# Patient Record
Sex: Male | Born: 1955 | Race: White | Hispanic: No | Marital: Married | Smoking: Current every day smoker
Health system: Southern US, Community
[De-identification: ages and names within clinical notes are randomized; demographics above are authoritative.]

## PROBLEM LIST (undated history)

## (undated) DIAGNOSIS — N429 Disorder of prostate, unspecified: Secondary | ICD-10-CM

## (undated) DIAGNOSIS — K219 Gastro-esophageal reflux disease without esophagitis: Secondary | ICD-10-CM

## (undated) DIAGNOSIS — E785 Hyperlipidemia, unspecified: Secondary | ICD-10-CM

## (undated) DIAGNOSIS — R0600 Dyspnea, unspecified: Secondary | ICD-10-CM

## (undated) DIAGNOSIS — N189 Chronic kidney disease, unspecified: Secondary | ICD-10-CM

## (undated) DIAGNOSIS — I639 Cerebral infarction, unspecified: Secondary | ICD-10-CM

## (undated) DIAGNOSIS — Z974 Presence of external hearing-aid: Secondary | ICD-10-CM

## (undated) DIAGNOSIS — Z972 Presence of dental prosthetic device (complete) (partial): Secondary | ICD-10-CM

## (undated) DIAGNOSIS — F419 Anxiety disorder, unspecified: Secondary | ICD-10-CM

## (undated) DIAGNOSIS — J45909 Unspecified asthma, uncomplicated: Secondary | ICD-10-CM

## (undated) DIAGNOSIS — I10 Essential (primary) hypertension: Secondary | ICD-10-CM

## (undated) DIAGNOSIS — R399 Unspecified symptoms and signs involving the genitourinary system: Secondary | ICD-10-CM

## (undated) DIAGNOSIS — J449 Chronic obstructive pulmonary disease, unspecified: Secondary | ICD-10-CM

## (undated) DIAGNOSIS — E079 Disorder of thyroid, unspecified: Secondary | ICD-10-CM

## (undated) DIAGNOSIS — M1990 Unspecified osteoarthritis, unspecified site: Secondary | ICD-10-CM

## (undated) HISTORY — PX: HERNIA REPAIR: SHX51

## (undated) HISTORY — DX: Unspecified asthma, uncomplicated: J45.909

## (undated) HISTORY — DX: Disorder of thyroid, unspecified: E07.9

## (undated) HISTORY — DX: Hyperlipidemia, unspecified: E78.5

## (undated) HISTORY — PX: BACK SURGERY: SHX140

## (undated) HISTORY — DX: Anxiety disorder, unspecified: F41.9

## (undated) HISTORY — PX: TONSILLECTOMY: SUR1361

## (undated) HISTORY — PX: EYE SURGERY: SHX253

## (undated) HISTORY — DX: Disorder of prostate, unspecified: N42.9

## (undated) HISTORY — DX: Chronic obstructive pulmonary disease, unspecified: J44.9

## (undated) HISTORY — DX: Cerebral infarction, unspecified: I63.9

## (undated) HISTORY — DX: Essential (primary) hypertension: I10

---

## 2004-11-20 ENCOUNTER — Emergency Department: Payer: Self-pay | Admitting: Internal Medicine

## 2004-11-20 ENCOUNTER — Other Ambulatory Visit: Payer: Self-pay

## 2004-11-24 ENCOUNTER — Ambulatory Visit: Payer: Self-pay | Admitting: Internal Medicine

## 2005-10-03 ENCOUNTER — Emergency Department: Payer: Self-pay | Admitting: Emergency Medicine

## 2009-04-03 ENCOUNTER — Emergency Department: Payer: Self-pay | Admitting: Unknown Physician Specialty

## 2009-09-25 ENCOUNTER — Ambulatory Visit: Payer: Self-pay | Admitting: Surgery

## 2009-10-01 ENCOUNTER — Ambulatory Visit: Payer: Self-pay | Admitting: Surgery

## 2009-10-14 ENCOUNTER — Emergency Department: Payer: Self-pay | Admitting: Emergency Medicine

## 2009-11-12 ENCOUNTER — Emergency Department: Payer: Self-pay | Admitting: Emergency Medicine

## 2009-11-29 ENCOUNTER — Inpatient Hospital Stay: Payer: Self-pay | Admitting: Psychiatry

## 2010-05-06 ENCOUNTER — Emergency Department: Payer: Self-pay | Admitting: Emergency Medicine

## 2010-05-08 ENCOUNTER — Ambulatory Visit: Payer: Self-pay | Admitting: Family Medicine

## 2011-07-15 ENCOUNTER — Emergency Department: Payer: Self-pay | Admitting: Emergency Medicine

## 2011-07-15 LAB — COMPREHENSIVE METABOLIC PANEL
Anion Gap: 7 (ref 7–16)
Chloride: 103 mmol/L (ref 98–107)
Co2: 27 mmol/L (ref 21–32)
EGFR (African American): >60
EGFR (Non-African Amer.): >60
Glucose: 106 mg/dL — ABNORMAL HIGH (ref 65–99)
SGOT(AST): 21 U/L (ref 15–37)

## 2011-07-15 LAB — CBC
HCT: 52.8 % — ABNORMAL HIGH (ref 40.0–52.0)
HGB: 17.5 g/dL (ref 13.0–18.0)
MCH: 30.7 pg (ref 26.0–34.0)
MCV: 93 fL (ref 80–100)
Platelet: 265 10*3/uL (ref 150–440)
RDW: 14.1 % (ref 11.5–14.5)
WBC: 17.8 10*3/uL — ABNORMAL HIGH (ref 3.8–10.6)

## 2011-07-15 LAB — URINALYSIS, COMPLETE
Glucose,UR: NEGATIVE mg/dL (ref 0–75)
Nitrite: NEGATIVE
Ph: 5 (ref 4.5–8.0)
Protein: 30
RBC,UR: 245 /HPF (ref 0–5)
Squamous Epithelial: 1
WBC UR: 2 /HPF (ref 0–5)

## 2011-09-13 ENCOUNTER — Emergency Department: Payer: Self-pay | Admitting: Emergency Medicine

## 2012-02-09 ENCOUNTER — Emergency Department: Payer: Self-pay | Admitting: Unknown Physician Specialty

## 2012-02-10 LAB — RAPID INFLUENZA A&B ANTIGENS

## 2012-03-01 DIAGNOSIS — J309 Allergic rhinitis, unspecified: Secondary | ICD-10-CM | POA: Insufficient documentation

## 2012-03-01 DIAGNOSIS — Z0000 Encounter for general adult medical examination without abnormal findings: Secondary | ICD-10-CM | POA: Insufficient documentation

## 2012-05-04 ENCOUNTER — Ambulatory Visit: Payer: Self-pay | Admitting: Pain Medicine

## 2012-05-17 ENCOUNTER — Ambulatory Visit: Payer: Self-pay | Admitting: Pain Medicine

## 2012-05-25 DIAGNOSIS — B360 Pityriasis versicolor: Secondary | ICD-10-CM | POA: Insufficient documentation

## 2012-06-15 ENCOUNTER — Ambulatory Visit: Payer: Self-pay | Admitting: Pain Medicine

## 2012-06-26 ENCOUNTER — Ambulatory Visit: Payer: Self-pay | Admitting: Pain Medicine

## 2012-07-25 ENCOUNTER — Ambulatory Visit: Payer: Self-pay | Admitting: Pain Medicine

## 2012-08-01 ENCOUNTER — Emergency Department: Payer: Self-pay | Admitting: Emergency Medicine

## 2012-08-02 ENCOUNTER — Ambulatory Visit: Payer: Self-pay | Admitting: Pain Medicine

## 2012-08-31 ENCOUNTER — Ambulatory Visit: Payer: Self-pay | Admitting: Pain Medicine

## 2012-09-13 ENCOUNTER — Ambulatory Visit: Payer: Self-pay | Admitting: Pain Medicine

## 2012-10-10 ENCOUNTER — Ambulatory Visit: Payer: Self-pay | Admitting: Pain Medicine

## 2012-10-23 ENCOUNTER — Ambulatory Visit: Payer: Self-pay | Admitting: Pain Medicine

## 2012-11-21 ENCOUNTER — Ambulatory Visit: Payer: Self-pay | Admitting: Pain Medicine

## 2012-12-04 ENCOUNTER — Ambulatory Visit: Payer: Self-pay | Admitting: Pain Medicine

## 2012-12-27 DIAGNOSIS — N529 Male erectile dysfunction, unspecified: Secondary | ICD-10-CM | POA: Insufficient documentation

## 2012-12-27 DIAGNOSIS — R399 Unspecified symptoms and signs involving the genitourinary system: Secondary | ICD-10-CM | POA: Insufficient documentation

## 2013-01-02 ENCOUNTER — Ambulatory Visit: Payer: Self-pay | Admitting: Pain Medicine

## 2013-01-08 ENCOUNTER — Ambulatory Visit: Payer: Self-pay | Admitting: Pain Medicine

## 2013-02-06 ENCOUNTER — Ambulatory Visit: Payer: Self-pay | Admitting: Pain Medicine

## 2013-02-12 ENCOUNTER — Ambulatory Visit: Payer: Self-pay | Admitting: Pain Medicine

## 2013-03-15 ENCOUNTER — Ambulatory Visit: Payer: Self-pay | Admitting: Pain Medicine

## 2013-03-19 ENCOUNTER — Ambulatory Visit: Payer: Self-pay | Admitting: Pain Medicine

## 2013-04-17 ENCOUNTER — Ambulatory Visit: Payer: Self-pay | Admitting: Pain Medicine

## 2013-04-23 ENCOUNTER — Ambulatory Visit: Payer: Self-pay | Admitting: Pain Medicine

## 2013-05-07 ENCOUNTER — Ambulatory Visit: Payer: Self-pay | Admitting: Pain Medicine

## 2013-05-22 DIAGNOSIS — F17200 Nicotine dependence, unspecified, uncomplicated: Secondary | ICD-10-CM | POA: Insufficient documentation

## 2013-06-05 ENCOUNTER — Ambulatory Visit: Payer: Self-pay | Admitting: Pain Medicine

## 2013-06-06 DIAGNOSIS — I6782 Cerebral ischemia: Secondary | ICD-10-CM | POA: Insufficient documentation

## 2013-06-06 DIAGNOSIS — G939 Disorder of brain, unspecified: Secondary | ICD-10-CM | POA: Insufficient documentation

## 2013-06-13 ENCOUNTER — Ambulatory Visit: Payer: Self-pay | Admitting: Pain Medicine

## 2013-07-03 ENCOUNTER — Ambulatory Visit: Payer: Self-pay | Admitting: Pain Medicine

## 2013-07-09 ENCOUNTER — Ambulatory Visit: Payer: Self-pay | Admitting: Pain Medicine

## 2013-08-07 ENCOUNTER — Ambulatory Visit: Payer: Self-pay | Admitting: Pain Medicine

## 2013-08-13 ENCOUNTER — Ambulatory Visit: Payer: Self-pay | Admitting: Pain Medicine

## 2013-09-11 ENCOUNTER — Ambulatory Visit: Payer: Self-pay | Admitting: Pain Medicine

## 2013-09-19 ENCOUNTER — Ambulatory Visit: Payer: Self-pay | Admitting: Pain Medicine

## 2013-10-16 ENCOUNTER — Ambulatory Visit: Payer: Self-pay | Admitting: Pain Medicine

## 2013-10-22 ENCOUNTER — Ambulatory Visit: Payer: Self-pay | Admitting: Pain Medicine

## 2013-11-13 ENCOUNTER — Ambulatory Visit: Payer: Self-pay | Admitting: Pain Medicine

## 2013-11-14 ENCOUNTER — Ambulatory Visit: Payer: Self-pay | Admitting: Pain Medicine

## 2013-12-11 ENCOUNTER — Ambulatory Visit: Payer: Self-pay | Admitting: Pain Medicine

## 2013-12-12 ENCOUNTER — Ambulatory Visit: Payer: Self-pay | Admitting: Pain Medicine

## 2014-01-10 ENCOUNTER — Ambulatory Visit: Payer: Self-pay | Admitting: Pain Medicine

## 2014-01-14 ENCOUNTER — Ambulatory Visit: Payer: Self-pay | Admitting: Pain Medicine

## 2014-02-12 ENCOUNTER — Ambulatory Visit: Payer: Self-pay | Admitting: Pain Medicine

## 2014-02-13 ENCOUNTER — Ambulatory Visit: Payer: Self-pay | Admitting: Pain Medicine

## 2014-03-12 ENCOUNTER — Emergency Department: Payer: Self-pay | Admitting: Emergency Medicine

## 2014-03-18 ENCOUNTER — Ambulatory Visit: Payer: Self-pay | Admitting: Pain Medicine

## 2014-04-12 ENCOUNTER — Other Ambulatory Visit: Payer: Self-pay | Admitting: Neurosurgery

## 2014-04-12 DIAGNOSIS — M5126 Other intervertebral disc displacement, lumbar region: Secondary | ICD-10-CM

## 2014-04-18 ENCOUNTER — Emergency Department: Payer: Self-pay | Admitting: Emergency Medicine

## 2014-04-18 ENCOUNTER — Ambulatory Visit: Payer: Self-pay | Admitting: Pain Medicine

## 2014-04-19 ENCOUNTER — Ambulatory Visit
Admission: RE | Admit: 2014-04-19 | Discharge: 2014-04-19 | Disposition: A | Discharge status: Home / Self Care | Payer: Medicaid Other | Source: Ambulatory Visit | Attending: Neurosurgery | Admitting: Neurosurgery

## 2014-04-19 DIAGNOSIS — M545 Low back pain, unspecified: Secondary | ICD-10-CM | POA: Insufficient documentation

## 2014-04-19 DIAGNOSIS — G8929 Other chronic pain: Secondary | ICD-10-CM

## 2014-04-19 DIAGNOSIS — J45909 Unspecified asthma, uncomplicated: Secondary | ICD-10-CM | POA: Insufficient documentation

## 2014-04-19 DIAGNOSIS — J432 Centrilobular emphysema: Secondary | ICD-10-CM | POA: Insufficient documentation

## 2014-04-19 DIAGNOSIS — M5126 Other intervertebral disc displacement, lumbar region: Secondary | ICD-10-CM

## 2014-04-19 DIAGNOSIS — I10 Essential (primary) hypertension: Secondary | ICD-10-CM | POA: Insufficient documentation

## 2014-04-19 MED ORDER — IOHEXOL 180 MG/ML  SOLN
20.0000 mL | Freq: Once | INTRAMUSCULAR | Status: AC | PRN
  Administered 2014-04-19: 20 mL via INTRATHECAL

## 2014-04-19 MED ORDER — DIAZEPAM 5 MG PO TABS
10.0000 mg | ORAL_TABLET | Freq: Once | ORAL | Status: AC
  Administered 2014-04-19: 10 mg via ORAL

## 2014-04-19 MED ORDER — MEPERIDINE HCL 100 MG/ML IJ SOLN
75.0000 mg | Freq: Once | INTRAMUSCULAR | Status: AC
  Administered 2014-04-19: 75 mg via INTRAMUSCULAR

## 2014-04-19 MED ORDER — ONDANSETRON HCL 4 MG/2ML IJ SOLN
4.0000 mg | Freq: Once | INTRAMUSCULAR | Status: AC
  Administered 2014-04-19: 4 mg via INTRAMUSCULAR

## 2014-04-19 NOTE — Discharge Instructions (Signed)

## 2014-04-24 ENCOUNTER — Ambulatory Visit
Admit: 2014-04-24 | Disposition: A | Discharge status: Home / Self Care | Payer: Self-pay | Attending: Pain Medicine | Admitting: Pain Medicine

## 2014-05-15 ENCOUNTER — Other Ambulatory Visit (HOSPITAL_COMMUNITY): Payer: Self-pay | Admitting: Neurosurgery

## 2014-05-23 ENCOUNTER — Ambulatory Visit
Admit: 2014-05-23 | Disposition: A | Discharge status: Home / Self Care | Payer: Self-pay | Attending: Pain Medicine | Admitting: Pain Medicine

## 2014-06-02 DIAGNOSIS — M5137 Other intervertebral disc degeneration, lumbosacral region: Secondary | ICD-10-CM | POA: Insufficient documentation

## 2014-06-02 DIAGNOSIS — M48062 Spinal stenosis, lumbar region with neurogenic claudication: Secondary | ICD-10-CM | POA: Insufficient documentation

## 2014-06-02 DIAGNOSIS — M5416 Radiculopathy, lumbar region: Secondary | ICD-10-CM | POA: Insufficient documentation

## 2014-06-02 DIAGNOSIS — M51379 Other intervertebral disc degeneration, lumbosacral region without mention of lumbar back pain or lower extremity pain: Secondary | ICD-10-CM | POA: Insufficient documentation

## 2014-06-02 NOTE — Patient Instructions (Addendum)
Continue present medications and take antibiotic.   F/U PCP for evaluation of BP and general medical condition.  F/U neurological evaluation.  .F/U surgical evaluation.   May consider radiofrequency rhizolysis, intraspinal implantation, and other procedures  Patient to call Pain Management Center for any concerns prior to scheduled appointment.  Patient is to call Pain Management Center should the patient have concerns prior to return appointmen Pain Management Discharge Instructions  General Discharge Instructions :  If you need to reach your doctor call: Monday-Friday 8:00 am - 4:00 pm at [phone] or toll free [phone].  After clinic hours [phone] to have operator reach doctor.  Bring all of your medication bottles to all your appointments in the pain clinic.  To cancel or reschedule your appointment with Pain Management please remember to call 24 hours in advance to avoid a fee.  Refer to the educational materials which you have been given on: General Risks, I had my Procedure. Discharge Instructions, Post Sedation.  Post Procedure Instructions:  The drugs you were given will stay in your system until tomorrow, so for the next 24 hours you should not drive, make any legal decisions or drink any alcoholic beverages.  You may eat anything you prefer, but it is better to start with liquids then soups and crackers, and gradually work up to solid foods.  Please notify your doctor immediately if you have any unusual bleeding, trouble breathing or pain that is not related to your normal pain.  Depending on the type of procedure that was done, some parts of your body may feel week and/or numb.  This usually clears up by tonight or the next day.  Walk with the use of an assistive device or accompanied by an adult for the 24 hours.  You may use ice on the affected area for the first 24 hours.  Put ice in a Ziploc bag and cover with a towel and place against area 15 minutes  on 15 minutes off.  You may switch to heat after 24 hours. 

## 2014-06-03 ENCOUNTER — Encounter: Payer: Self-pay | Admitting: Pain Medicine

## 2014-06-03 ENCOUNTER — Ambulatory Visit: Payer: Medicaid Other | Attending: Pain Medicine | Admitting: Pain Medicine

## 2014-06-03 VITALS — BP 129/93 | HR 80 | Temp 98.2°F | Resp 16 | Ht 67.0 in | Wt 175.0 lb

## 2014-06-03 DIAGNOSIS — F0390 Unspecified dementia without behavioral disturbance: Secondary | ICD-10-CM | POA: Diagnosis not present

## 2014-06-03 DIAGNOSIS — M5137 Other intervertebral disc degeneration, lumbosacral region: Secondary | ICD-10-CM | POA: Insufficient documentation

## 2014-06-03 DIAGNOSIS — M4806 Spinal stenosis, lumbar region: Secondary | ICD-10-CM | POA: Insufficient documentation

## 2014-06-03 DIAGNOSIS — M5416 Radiculopathy, lumbar region: Secondary | ICD-10-CM | POA: Diagnosis not present

## 2014-06-03 DIAGNOSIS — M4316 Spondylolisthesis, lumbar region: Secondary | ICD-10-CM | POA: Diagnosis not present

## 2014-06-03 DIAGNOSIS — M48062 Spinal stenosis, lumbar region with neurogenic claudication: Secondary | ICD-10-CM

## 2014-06-03 MED ORDER — CEFAZOLIN SODIUM 1-5 GM-% IV SOLN
1.0000 g | Freq: Once | INTRAVENOUS | Status: DC
  Filled 2014-06-03: qty 50

## 2014-06-03 MED ORDER — BUPIVACAINE HCL (PF) 0.25 % IJ SOLN
INTRAMUSCULAR | Status: AC
  Administered 2014-06-03: 30 mL
  Filled 2014-06-03: qty 30

## 2014-06-03 MED ORDER — ORPHENADRINE CITRATE 30 MG/ML IJ SOLN
INTRAMUSCULAR | Status: AC
  Filled 2014-06-03: qty 2

## 2014-06-03 MED ORDER — MIDAZOLAM HCL 5 MG/5ML IJ SOLN
INTRAMUSCULAR | Status: AC
  Administered 2014-06-03: 5 mg via INTRAVENOUS
  Filled 2014-06-03: qty 5

## 2014-06-03 MED ORDER — CEFAZOLIN SODIUM 1 G IJ SOLR
INTRAMUSCULAR | Status: AC
  Administered 2014-06-03: 400 mg via INTRAVENOUS
  Filled 2014-06-03: qty 10

## 2014-06-03 MED ORDER — CEFUROXIME AXETIL 250 MG PO TABS: 250.0000 mg | ORAL_TABLET | Freq: Two times a day (BID) | ORAL | Status: DC

## 2014-06-03 MED ORDER — FENTANYL CITRATE (PF) 100 MCG/2ML IJ SOLN
INTRAMUSCULAR | Status: AC
  Administered 2014-06-03: 100 ug via INTRAVENOUS
  Filled 2014-06-03: qty 2

## 2014-06-03 MED ORDER — TRIAMCINOLONE ACETONIDE 40 MG/ML IJ SUSP
INTRAMUSCULAR | Status: AC
  Administered 2014-06-03: 10 mg
  Filled 2014-06-03: qty 1

## 2014-06-03 NOTE — Progress Notes (Signed)
Patient is a 59 year old gentleman returns to pain management Center for lower back and lower extremity pain with severe pain radiating to the right lower extremity more than the left lower extremity Pain becomes quite severe with standing and walking Will proceed with procedure in attempt to decrease severity of symptoms minimize progression of symptoms. Patient is understanding and agrees with suggested treatment plan

## 2014-06-03 NOTE — Progress Notes (Signed)
DISCHARGE PATIENT HOME VIA WHEELCHAIR  AT   1502  HOURS  ACCOMPANIED BY WIFE. TEACH BACK  3 DONE

## 2014-06-03 NOTE — Procedures (Signed)
PROCEDURE PERFORMED: Lumbosacral selective nerve root block   NOTE: The patient is a 59 y.o. year-old male who returns to Pain Management Center for further evaluation and treatment of pain involving the lumbar and lower extremity region. 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 There is concern regarding intraspinal abnormalities contributing to the patient's symptomatology. The risks, benefits, and expectations of the procedure have been explained to the patient who was understanding and in agreement with suggested treatment plan. We will proceed with interventional treatment as discussed and as explained to the patient. The patient is understanding and in agreement with suggested treatment plan.   DESCRIPTION OF PROCEDURE: Lumbosacral selective nerve root block with IV Versed, IV fentanyl conscious sedation, EKG, blood pressure, pulse, and pulse oximetry monitoring. The procedure was performed with the patient in the prone position under fluoroscopic guidance. With the patient in the prone position, Betadine prep of proposed entry site was performed. Local anesthetic skin wheal of proposed needle entry site was prepared with 1.5% plain lidocaine with AP view of the lumbosacral spine.   PROCEDURE #1: Needle placement at the L2 vertebral body: A 22-gauge needle was inserted at the inferior border of the transverse process of the vertebral body with needle placed medial to the midline of the transverse process on AP view of the lumbosacral spine.  PROCEDURE #2: Needle placement at the L3 vertebral body: A 22-gauge  needle was inserted at the inferior border of the transverse process of the vertebral body with needle placed medial to the midline of the transverse process on AP view of the lumbosacral spine.    PROCEDURE #3: Needle placement at the L4 vertebral body: A22-gauge needle was inserted at the inferior border of the transverse process of the vertebral body with needle placed medial to the midline of the transverse process on AP view of the lumbosacral spine.  PROCEDURE #4. Needle placement at the L5 vertebral body: A 22-gauge needle was inserted at the inferior border of the transverse process of the vertebral body with needle placement medial to the midline of the transverse process on AP view of the lumbosacral spine.   Needle placement was then verified on lateral view at all levels with needle tip documented to be in the posterior superior quadrant of the intervertebral foramen of  L L2, L3, L4, and L5.  Following negative aspiration for heme and CSF at each level, each level was injected with 3 mL of 0.25% bupivacaine with Kenalog. The patient tolerated the procedure well. A total of 10 mg of Kenalog was utilized for the procedure.   PLAN:  1. Medications: Will continue presently prescribed medications. 2. The patient is to undergo follow-up evaluation with Dr.Bender  for evaluation of blood pressure and general medical condition status post procedure performed on today's visit. 3. Surgical follow-up evaluation. 4. Neurological evaluation. 5. May consider radiofrequency procedures, implantation type procedures and other treatment pending response to treatment and follow-up evaluation. 6. The patient has been advise do adhere to proper body mechanics and avoid activities which may aggravate condition. 7. The patient has been advised to call the Pain Management Center prior to scheduled return appointment should there be significant change  in the patient's condition or should the patient have other  concerns regarding condition prior to scheduled return appointment.

## 2014-06-04 ENCOUNTER — Telehealth: Payer: Self-pay | Admitting: *Deleted

## 2014-06-04 NOTE — Telephone Encounter (Signed)
Message left with post procedure phone call

## 2014-06-06 NOTE — Pre-Procedure Instructions (Signed)
Ryan [name]  06/06/2014   Your procedure is scheduled on: Tuesday, May 24th   [facility]  AM.   Call this number if you have problems the morning of surgery: [phone]   Remember:   Do not eat food or drink liquids after midnight Monday.   Take these medicines the morning of surgery with A SIP OF WATER: Zyrtec, Gabapentin, Flomax   Do not wear jewelry -no rings or watches.  Do not wear lotions or colognes.   You may NOT wear deodorant the day of surgery.   Men may shave face and neck.   Do not bring valuables to the hospital.  [facility] is not responsible [facility] any belongings or valuables.               Contacts, dentures or bridgework may not be worn into surgery.  Leave suitcase in the car. After surgery it may be brought to your room.  For patients admitted to the hospital, discharge time is determined by your treatment team.    Name and phone number of your driver:    Special Instructions: "Preparing for Surgery" instruction sheet.   Please read over the following fact sheets that you were given: Pain Booklet, Coughing and Deep Breathing, Blood Transfusion Information, MRSA Information and Surgical Site Infection Prevention

## 2014-06-07 ENCOUNTER — Encounter (HOSPITAL_COMMUNITY)
Admission: RE | Admit: 2014-06-07 | Discharge: 2014-06-07 | Disposition: A | Discharge status: Home / Self Care | Payer: Medicaid Other | Source: Ambulatory Visit | Attending: Neurosurgery | Admitting: Neurosurgery

## 2014-06-07 ENCOUNTER — Encounter (HOSPITAL_COMMUNITY): Payer: Self-pay

## 2014-06-07 DIAGNOSIS — F17200 Nicotine dependence, unspecified, uncomplicated: Secondary | ICD-10-CM | POA: Diagnosis not present

## 2014-06-07 DIAGNOSIS — Z8673 Personal history of transient ischemic attack (TIA), and cerebral infarction without residual deficits: Secondary | ICD-10-CM | POA: Diagnosis not present

## 2014-06-07 DIAGNOSIS — Z01818 Encounter for other preprocedural examination: Secondary | ICD-10-CM | POA: Diagnosis not present

## 2014-06-07 DIAGNOSIS — I10 Essential (primary) hypertension: Secondary | ICD-10-CM | POA: Diagnosis not present

## 2014-06-07 DIAGNOSIS — Z01812 Encounter for preprocedural laboratory examination: Secondary | ICD-10-CM | POA: Diagnosis not present

## 2014-06-07 DIAGNOSIS — M5126 Other intervertebral disc displacement, lumbar region: Secondary | ICD-10-CM | POA: Diagnosis not present

## 2014-06-07 DIAGNOSIS — I444 Left anterior fascicular block: Secondary | ICD-10-CM | POA: Insufficient documentation

## 2014-06-07 DIAGNOSIS — E785 Hyperlipidemia, unspecified: Secondary | ICD-10-CM | POA: Diagnosis not present

## 2014-06-07 DIAGNOSIS — J45909 Unspecified asthma, uncomplicated: Secondary | ICD-10-CM | POA: Diagnosis not present

## 2014-06-07 DIAGNOSIS — J449 Chronic obstructive pulmonary disease, unspecified: Secondary | ICD-10-CM | POA: Diagnosis not present

## 2014-06-07 DIAGNOSIS — K219 Gastro-esophageal reflux disease without esophagitis: Secondary | ICD-10-CM | POA: Diagnosis not present

## 2014-06-07 HISTORY — DX: Chronic kidney disease, unspecified: N18.9

## 2014-06-07 HISTORY — DX: Gastro-esophageal reflux disease without esophagitis: K21.9

## 2014-06-07 LAB — SURGICAL PCR SCREEN
MRSA, PCR: NEGATIVE
Staphylococcus aureus: NEGATIVE

## 2014-06-07 LAB — CBC
HCT: 47.1 % (ref 39.0–52.0)
Hemoglobin: 15.6 g/dL (ref 13.0–17.0)
MCH: 29.8 pg (ref 26.0–34.0)
MCHC: 33.1 g/dL (ref 30.0–36.0)
MCV: 90.1 fL (ref 78.0–100.0)
Platelets: 278 10*3/uL (ref 150–400)
RBC: 5.23 MIL/uL (ref 4.22–5.81)
RDW: 12.9 % (ref 11.5–15.5)
WBC: 11 10*3/uL — ABNORMAL HIGH (ref 4.0–10.5)

## 2014-06-07 LAB — BASIC METABOLIC PANEL
Anion gap: 10 (ref 5–15)
BUN: 17 mg/dL (ref 6–20)
CO2: 23 mmol/L (ref 22–32)
CREATININE: 1.02 mg/dL (ref 0.61–1.24)
Calcium: 9 mg/dL (ref 8.9–10.3)
Chloride: 107 mmol/L (ref 101–111)
GFR calc Af Amer: >60 mL/min (ref >60)
Glucose, Bld: 101 mg/dL — ABNORMAL HIGH (ref 65–99)
POTASSIUM: 4.4 mmol/L (ref 3.5–5.1)
SODIUM: 140 mmol/L (ref 135–145)

## 2014-06-07 NOTE — Progress Notes (Addendum)
Patient does not want to wear the blood band x 11 days.   Understands he will have sample drawn DOS. Has never seen cardiologist and denies any cardiac issues.  [facility] [place]-- [phone]  (called them for comparison EKG)

## 2014-06-10 NOTE — Progress Notes (Signed)
Anesthesia Chart Review:  Pt is 59 year old male scheduled for transforaminal lumbar interbody fusion on 06/18/2014 with Dr. Gerlene FeeKritzer.   PMH includes: HTN, TIA (2015), COPD, asthma, hyperlipidemia, GERD. Current smoker. BMI 27  Preoperative labs reviewed.    EKG: NSR. Possible Left atrial enlargement. Left anterior fascicular block. Left ventricular hypertrophy. No significant change since last tracing 08/01/2012.   Carotid duplex US 05/21/2013: -Right Carotid: Non-hemodynamically significant plaque noted in the CCA.  -Left Carotid: Non-hemodynamically significant plaque noted in the CCA.  -Vertebrals: Both vertebral arteries were patent with antegrade flow.  -Subclavians: Normal flow hemodynamics were seen in bilateral subclavian arteries.   Reviewed EKG with Dr. Okey Dupreose.   If no changes, I anticipate pt can proceed with surgery as scheduled.   Rica Mastngela Yoshiko Keleher, FNP-BC [facility]Anesthesiology Phone: [phone] 06/10/2014 3:40 PM

## 2014-06-17 NOTE — Anesthesia Preprocedure Evaluation (Addendum)
Anesthesia Evaluation  Patient identified by MRN, date of birth, ID band Patient awake    Reviewed: Allergy & Precautions, NPO status , Patient's Chart, lab work & pertinent test results  History of Anesthesia Complications Negative for: history of anesthetic complications  Airway Mallampati: II  TM Distance: >3 FB Neck ROM: Full    Dental no notable dental hx. (+) Dental Advisory Given, Edentulous Upper, Partial Lower, Poor Dentition   Pulmonary asthma , COPDCurrent Smoker,  breath sounds clear to auscultation  Pulmonary exam normal       Cardiovascular hypertension, Pt. on medications Normal cardiovascular examRhythm:Regular Rate:Normal     Neuro/Psych negative neurological ROS  negative psych ROS   GI/Hepatic Neg liver ROS, GERD-  Medicated and Controlled,  Endo/Other  negative endocrine ROS  Renal/GU negative Renal ROS  negative genitourinary   Musculoskeletal  (+) Arthritis -, Osteoarthritis,    Abdominal   Peds negative pediatric ROS (+)  Hematology negative hematology ROS (+)   Anesthesia Other Findings   Reproductive/Obstetrics negative OB ROS                            Anesthesia Physical Anesthesia Plan  ASA: III  Anesthesia Plan: General   Post-op Pain Management:    Induction: Intravenous  Airway Management Planned: Oral ETT  Additional Equipment:   Intra-op Plan:   Post-operative Plan: Extubation in OR  Informed Consent: I have reviewed the patients History and Physical, chart, labs and discussed the procedure including the risks, benefits and alternatives for the proposed anesthesia with the patient or authorized representative who has indicated his/her understanding and acceptance.   Dental advisory given  Plan Discussed with: CRNA  Anesthesia Plan Comments:         Anesthesia Quick Evaluation

## 2014-06-18 ENCOUNTER — Encounter (HOSPITAL_COMMUNITY)
Admission: RE | Disposition: A | Discharge status: Home / Self Care | Payer: Self-pay | Source: Ambulatory Visit | Attending: Neurosurgery

## 2014-06-18 ENCOUNTER — Inpatient Hospital Stay (HOSPITAL_COMMUNITY): Payer: Medicaid Other | Admitting: Emergency Medicine

## 2014-06-18 ENCOUNTER — Inpatient Hospital Stay (HOSPITAL_COMMUNITY): Payer: Medicaid Other

## 2014-06-18 ENCOUNTER — Inpatient Hospital Stay (HOSPITAL_COMMUNITY)
Admission: RE | Admit: 2014-06-18 | Discharge: 2014-06-19 | DRG: 460 | Disposition: A | Discharge status: Home / Self Care | Payer: Medicaid Other | Source: Ambulatory Visit | Attending: Neurosurgery | Admitting: Neurosurgery

## 2014-06-18 ENCOUNTER — Inpatient Hospital Stay (HOSPITAL_COMMUNITY): Payer: Medicaid Other | Admitting: Anesthesiology

## 2014-06-18 ENCOUNTER — Encounter (HOSPITAL_COMMUNITY): Payer: Self-pay | Admitting: *Deleted

## 2014-06-18 DIAGNOSIS — M4326 Fusion of spine, lumbar region: Secondary | ICD-10-CM

## 2014-06-18 DIAGNOSIS — M549 Dorsalgia, unspecified: Secondary | ICD-10-CM | POA: Diagnosis present

## 2014-06-18 DIAGNOSIS — Z79899 Other long term (current) drug therapy: Secondary | ICD-10-CM

## 2014-06-18 DIAGNOSIS — F17210 Nicotine dependence, cigarettes, uncomplicated: Secondary | ICD-10-CM | POA: Diagnosis present

## 2014-06-18 DIAGNOSIS — M5127 Other intervertebral disc displacement, lumbosacral region: Secondary | ICD-10-CM | POA: Diagnosis not present

## 2014-06-18 DIAGNOSIS — I129 Hypertensive chronic kidney disease with stage 1 through stage 4 chronic kidney disease, or unspecified chronic kidney disease: Secondary | ICD-10-CM | POA: Diagnosis present

## 2014-06-18 DIAGNOSIS — E785 Hyperlipidemia, unspecified: Secondary | ICD-10-CM | POA: Diagnosis present

## 2014-06-18 DIAGNOSIS — J45909 Unspecified asthma, uncomplicated: Secondary | ICD-10-CM | POA: Diagnosis present

## 2014-06-18 DIAGNOSIS — J449 Chronic obstructive pulmonary disease, unspecified: Secondary | ICD-10-CM | POA: Diagnosis not present

## 2014-06-18 DIAGNOSIS — M5126 Other intervertebral disc displacement, lumbar region: Secondary | ICD-10-CM | POA: Diagnosis present

## 2014-06-18 DIAGNOSIS — N189 Chronic kidney disease, unspecified: Secondary | ICD-10-CM | POA: Diagnosis not present

## 2014-06-18 HISTORY — PX: TRANSFORAMINAL LUMBAR INTERBODY FUSION (TLIF) WITH PEDICLE SCREW FIXATION 1 LEVEL: SHX6141

## 2014-06-18 LAB — TYPE AND SCREEN
ABO/RH(D): A NEG
Antibody Screen: NEGATIVE

## 2014-06-18 LAB — ABO/RH: ABO/RH(D): A NEG

## 2014-06-18 SURGERY — TRANSFORAMINAL LUMBAR INTERBODY FUSION (TLIF) WITH PEDICLE SCREW FIXATION 1 LEVEL
Anesthesia: General | Site: Back | Laterality: Right

## 2014-06-18 MED ORDER — ACETAMINOPHEN 325 MG PO TABS
650.0000 mg | ORAL_TABLET | ORAL | Status: DC | PRN
Start: 2014-06-18 — End: 2014-06-19

## 2014-06-18 MED ORDER — TAMSULOSIN HCL 0.4 MG PO CAPS: 0.8000 mg | ORAL_CAPSULE | Freq: Every day | ORAL | Status: DC

## 2014-06-18 MED ORDER — ALBUTEROL SULFATE HFA 108 (90 BASE) MCG/ACT IN AERS
INHALATION_SPRAY | RESPIRATORY_TRACT | Status: AC
  Filled 2014-06-18: qty 6.7

## 2014-06-18 MED ORDER — SODIUM CHLORIDE 0.9 % IJ SOLN
INTRAMUSCULAR | Status: AC
  Filled 2014-06-18: qty 10

## 2014-06-18 MED ORDER — ALBUTEROL SULFATE HFA 108 (90 BASE) MCG/ACT IN AERS
INHALATION_SPRAY | RESPIRATORY_TRACT | Status: DC | PRN
  Administered 2014-06-18 (×2): 2 via RESPIRATORY_TRACT

## 2014-06-18 MED ORDER — NEOSTIGMINE METHYLSULFATE 10 MG/10ML IV SOLN
INTRAVENOUS | Status: AC
  Filled 2014-06-18: qty 1

## 2014-06-18 MED ORDER — GLYCOPYRROLATE 0.2 MG/ML IJ SOLN
INTRAMUSCULAR | Status: DC | PRN
  Administered 2014-06-18: .3 mg via INTRAVENOUS

## 2014-06-18 MED ORDER — SODIUM CHLORIDE 0.9 % IV SOLN: 250.0000 mL | INTRAVENOUS | Status: DC

## 2014-06-18 MED ORDER — LIDOCAINE HCL (CARDIAC) 20 MG/ML IV SOLN
INTRAVENOUS | Status: DC | PRN
  Administered 2014-06-18: 80 mg via INTRAVENOUS

## 2014-06-18 MED ORDER — KCL IN DEXTROSE-NACL 20-5-0.45 MEQ/L-%-% IV SOLN
INTRAVENOUS | Status: AC
  Filled 2014-06-18: qty 1000

## 2014-06-18 MED ORDER — ALBUTEROL SULFATE (2.5 MG/3ML) 0.083% IN NEBU: 3.0000 mL | INHALATION_SOLUTION | Freq: Four times a day (QID) | RESPIRATORY_TRACT | Status: DC | PRN

## 2014-06-18 MED ORDER — MENTHOL 3 MG MT LOZG: 1.0000 | LOZENGE | OROMUCOSAL | Status: DC | PRN

## 2014-06-18 MED ORDER — PHENYLEPHRINE HCL 10 MG/ML IJ SOLN
INTRAMUSCULAR | Status: DC | PRN
  Administered 2014-06-18: 80 ug via INTRAVENOUS
  Administered 2014-06-18: 200 ug via INTRAVENOUS
  Administered 2014-06-18: 120 ug via INTRAVENOUS

## 2014-06-18 MED ORDER — PHENOL 1.4 % MT LIQD: 1.0000 | OROMUCOSAL | Status: DC | PRN

## 2014-06-18 MED ORDER — BISACODYL 5 MG PO TBEC
5.0000 mg | DELAYED_RELEASE_TABLET | Freq: Every day | ORAL | Status: DC | PRN
  Filled 2014-06-18: qty 1

## 2014-06-18 MED ORDER — ARTIFICIAL TEARS OP OINT
TOPICAL_OINTMENT | OPHTHALMIC | Status: AC
  Filled 2014-06-18: qty 3.5

## 2014-06-18 MED ORDER — ROCURONIUM BROMIDE 50 MG/5ML IV SOLN
INTRAVENOUS | Status: AC
  Filled 2014-06-18: qty 1

## 2014-06-18 MED ORDER — PROPOFOL 10 MG/ML IV BOLUS
INTRAVENOUS | Status: AC
  Filled 2014-06-18: qty 20

## 2014-06-18 MED ORDER — CYCLOBENZAPRINE HCL 10 MG PO TABS
ORAL_TABLET | ORAL | Status: AC
  Filled 2014-06-18: qty 1

## 2014-06-18 MED ORDER — HYDROMORPHONE HCL 1 MG/ML IJ SOLN
0.5000 mg | Freq: Once | INTRAMUSCULAR | Status: AC
  Administered 2014-06-18: 0.5 mg via INTRAVENOUS

## 2014-06-18 MED ORDER — CYCLOBENZAPRINE HCL 10 MG PO TABS
10.0000 mg | ORAL_TABLET | Freq: Three times a day (TID) | ORAL | Status: DC | PRN
  Administered 2014-06-18 – 2014-06-19 (×4): 10 mg via ORAL
  Filled 2014-06-18 (×5): qty 1

## 2014-06-18 MED ORDER — FENTANYL CITRATE (PF) 100 MCG/2ML IJ SOLN
25.0000 ug | INTRAMUSCULAR | Status: DC | PRN
  Administered 2014-06-18: 25 ug via INTRAVENOUS
  Administered 2014-06-18: 50 ug via INTRAVENOUS
  Administered 2014-06-18: 25 ug via INTRAVENOUS

## 2014-06-18 MED ORDER — SODIUM CHLORIDE 0.9 % IJ SOLN
3.0000 mL | Freq: Two times a day (BID) | INTRAMUSCULAR | Status: DC
  Administered 2014-06-18 (×2): 3 mL via INTRAVENOUS

## 2014-06-18 MED ORDER — OXYCODONE-ACETAMINOPHEN 5-325 MG PO TABS
1.0000 | ORAL_TABLET | ORAL | Status: DC | PRN
  Administered 2014-06-18 – 2014-06-19 (×6): 2 via ORAL
  Filled 2014-06-18 (×6): qty 2

## 2014-06-18 MED ORDER — ONDANSETRON HCL 4 MG/2ML IJ SOLN: 4.0000 mg | INTRAMUSCULAR | Status: DC | PRN

## 2014-06-18 MED ORDER — CEFAZOLIN SODIUM-DEXTROSE 2-3 GM-% IV SOLR
2.0000 g | INTRAVENOUS | Status: AC
  Administered 2014-06-18: 2 g via INTRAVENOUS

## 2014-06-18 MED ORDER — DOCUSATE SODIUM 100 MG PO CAPS
100.0000 mg | ORAL_CAPSULE | Freq: Two times a day (BID) | ORAL | Status: DC
  Administered 2014-06-18 – 2014-06-19 (×3): 100 mg via ORAL
  Filled 2014-06-18 (×3): qty 1

## 2014-06-18 MED ORDER — MIDAZOLAM HCL 5 MG/5ML IJ SOLN
INTRAMUSCULAR | Status: DC | PRN
  Administered 2014-06-18: 2 mg via INTRAVENOUS

## 2014-06-18 MED ORDER — ONDANSETRON HCL 4 MG/2ML IJ SOLN: 4.0000 mg | Freq: Once | INTRAMUSCULAR | Status: DC | PRN

## 2014-06-18 MED ORDER — LISINOPRIL 20 MG PO TABS
20.0000 mg | ORAL_TABLET | Freq: Every day | ORAL | Status: DC
  Administered 2014-06-19: 20 mg via ORAL
  Filled 2014-06-18: qty 1

## 2014-06-18 MED ORDER — HYDROMORPHONE HCL 1 MG/ML IJ SOLN
0.5000 mg | Freq: Once | INTRAMUSCULAR | Status: AC
Start: 2014-06-18 — End: 2014-06-18
  Administered 2014-06-18: 0.5 mg via INTRAVENOUS

## 2014-06-18 MED ORDER — THROMBIN 20000 UNITS EX SOLR
CUTANEOUS | Status: DC | PRN
  Administered 2014-06-18: 20 mL via TOPICAL

## 2014-06-18 MED ORDER — BUPIVACAINE HCL (PF) 0.5 % IJ SOLN
INTRAMUSCULAR | Status: DC | PRN
  Administered 2014-06-18: 30 mL

## 2014-06-18 MED ORDER — SUCCINYLCHOLINE CHLORIDE 20 MG/ML IJ SOLN
INTRAMUSCULAR | Status: AC
  Filled 2014-06-18: qty 1

## 2014-06-18 MED ORDER — PROPOFOL 10 MG/ML IV BOLUS
INTRAVENOUS | Status: DC | PRN
  Administered 2014-06-18: 160 mg via INTRAVENOUS

## 2014-06-18 MED ORDER — FENTANYL CITRATE (PF) 100 MCG/2ML IJ SOLN
INTRAMUSCULAR | Status: DC | PRN
  Administered 2014-06-18 (×2): 50 ug via INTRAVENOUS
  Administered 2014-06-18: 100 ug via INTRAVENOUS
  Administered 2014-06-18: 50 ug via INTRAVENOUS

## 2014-06-18 MED ORDER — EPHEDRINE SULFATE 50 MG/ML IJ SOLN
INTRAMUSCULAR | Status: DC | PRN
  Administered 2014-06-18: 15 mg via INTRAVENOUS
  Administered 2014-06-18: 10 mg via INTRAVENOUS
  Administered 2014-06-18: 15 mg via INTRAVENOUS

## 2014-06-18 MED ORDER — MIDAZOLAM HCL 2 MG/2ML IJ SOLN
INTRAMUSCULAR | Status: AC
  Filled 2014-06-18: qty 2

## 2014-06-18 MED ORDER — ONDANSETRON HCL 4 MG/2ML IJ SOLN
INTRAMUSCULAR | Status: AC
  Filled 2014-06-18: qty 2

## 2014-06-18 MED ORDER — TAMSULOSIN HCL 0.4 MG PO CAPS
0.4000 mg | ORAL_CAPSULE | Freq: Two times a day (BID) | ORAL | Status: DC
  Administered 2014-06-18 – 2014-06-19 (×2): 0.4 mg via ORAL
  Filled 2014-06-18 (×3): qty 1

## 2014-06-18 MED ORDER — EPHEDRINE SULFATE 50 MG/ML IJ SOLN
INTRAMUSCULAR | Status: AC
  Filled 2014-06-18: qty 1

## 2014-06-18 MED ORDER — 0.9 % SODIUM CHLORIDE (POUR BTL) OPTIME
TOPICAL | Status: DC | PRN
  Administered 2014-06-18: 1000 mL

## 2014-06-18 MED ORDER — PHENYLEPHRINE 40 MCG/ML (10ML) SYRINGE FOR IV PUSH (FOR BLOOD PRESSURE SUPPORT)
PREFILLED_SYRINGE | INTRAVENOUS | Status: AC
  Filled 2014-06-18: qty 10

## 2014-06-18 MED ORDER — CEFAZOLIN SODIUM-DEXTROSE 2-3 GM-% IV SOLR
2.0000 g | Freq: Three times a day (TID) | INTRAVENOUS | Status: AC
  Administered 2014-06-18 (×2): 2 g via INTRAVENOUS
  Filled 2014-06-18 (×2): qty 50

## 2014-06-18 MED ORDER — GLYCOPYRROLATE 0.2 MG/ML IJ SOLN
INTRAMUSCULAR | Status: AC
  Filled 2014-06-18: qty 3

## 2014-06-18 MED ORDER — ROCURONIUM BROMIDE 100 MG/10ML IV SOLN
INTRAVENOUS | Status: DC | PRN
  Administered 2014-06-18: 40 mg via INTRAVENOUS
  Administered 2014-06-18 (×2): 20 mg via INTRAVENOUS
  Administered 2014-06-18: 10 mg via INTRAVENOUS

## 2014-06-18 MED ORDER — FENTANYL CITRATE (PF) 250 MCG/5ML IJ SOLN
INTRAMUSCULAR | Status: AC
  Filled 2014-06-18: qty 5

## 2014-06-18 MED ORDER — HYDROMORPHONE HCL 1 MG/ML IJ SOLN
1.0000 mg | INTRAMUSCULAR | Status: DC | PRN
  Administered 2014-06-18: 1 mg via INTRAMUSCULAR
  Filled 2014-06-18: qty 1

## 2014-06-18 MED ORDER — PHENYLEPHRINE HCL 10 MG/ML IJ SOLN
10.0000 mg | INTRAVENOUS | Status: DC | PRN
  Administered 2014-06-18: 100 ug/min via INTRAVENOUS

## 2014-06-18 MED ORDER — ACETAMINOPHEN 650 MG RE SUPP: 650.0000 mg | RECTAL | Status: DC | PRN

## 2014-06-18 MED ORDER — LACTATED RINGERS IV SOLN
INTRAVENOUS | Status: DC | PRN
  Administered 2014-06-18 (×2): via INTRAVENOUS

## 2014-06-18 MED ORDER — ARTIFICIAL TEARS OP OINT
TOPICAL_OINTMENT | OPHTHALMIC | Status: DC | PRN
  Administered 2014-06-18: 1 via OPHTHALMIC

## 2014-06-18 MED ORDER — NEOSTIGMINE METHYLSULFATE 10 MG/10ML IV SOLN
INTRAVENOUS | Status: DC | PRN
  Administered 2014-06-18: 2 mg via INTRAVENOUS

## 2014-06-18 MED ORDER — SODIUM CHLORIDE 0.9 % IR SOLN
Status: DC | PRN
  Administered 2014-06-18: 500 mL

## 2014-06-18 MED ORDER — DEXAMETHASONE 4 MG PO TABS
4.0000 mg | ORAL_TABLET | Freq: Four times a day (QID) | ORAL | Status: AC
  Administered 2014-06-18 (×2): 4 mg via ORAL
  Filled 2014-06-18 (×2): qty 1

## 2014-06-18 MED ORDER — PANTOPRAZOLE SODIUM 40 MG IV SOLR
40.0000 mg | Freq: Every day | INTRAVENOUS | Status: DC
  Filled 2014-06-18: qty 40

## 2014-06-18 MED ORDER — NICOTINE 21 MG/24HR TD PT24
21.0000 mg | MEDICATED_PATCH | Freq: Every day | TRANSDERMAL | Status: DC
  Administered 2014-06-18 – 2014-06-19 (×2): 21 mg via TRANSDERMAL
  Filled 2014-06-18 (×2): qty 1

## 2014-06-18 MED ORDER — HYDROMORPHONE HCL 1 MG/ML IJ SOLN
INTRAMUSCULAR | Status: AC
  Administered 2014-06-18: 0.5 mg
  Filled 2014-06-18: qty 1

## 2014-06-18 MED ORDER — LIDOCAINE HCL (CARDIAC) 20 MG/ML IV SOLN
INTRAVENOUS | Status: AC
  Filled 2014-06-18: qty 5

## 2014-06-18 MED ORDER — ONDANSETRON HCL 4 MG/2ML IJ SOLN
INTRAMUSCULAR | Status: DC | PRN
  Administered 2014-06-18: 4 mg via INTRAVENOUS

## 2014-06-18 MED ORDER — PANTOPRAZOLE SODIUM 40 MG PO TBEC
40.0000 mg | DELAYED_RELEASE_TABLET | Freq: Every day | ORAL | Status: DC
  Administered 2014-06-18: 40 mg via ORAL

## 2014-06-18 MED ORDER — FENTANYL CITRATE (PF) 100 MCG/2ML IJ SOLN
INTRAMUSCULAR | Status: AC
  Filled 2014-06-18: qty 2

## 2014-06-18 MED ORDER — SODIUM CHLORIDE 0.9 % IJ SOLN: 3.0000 mL | INTRAMUSCULAR | Status: DC | PRN

## 2014-06-18 MED ORDER — DEXAMETHASONE SODIUM PHOSPHATE 4 MG/ML IJ SOLN: 4.0000 mg | Freq: Four times a day (QID) | INTRAMUSCULAR | Status: AC

## 2014-06-18 MED ORDER — KCL IN DEXTROSE-NACL 20-5-0.45 MEQ/L-%-% IV SOLN
80.0000 mL/h | INTRAVENOUS | Status: DC
  Administered 2014-06-18: 80 mL/h via INTRAVENOUS
  Filled 2014-06-18 (×3): qty 1000

## 2014-06-18 MED ORDER — GABAPENTIN 300 MG PO CAPS
600.0000 mg | ORAL_CAPSULE | Freq: Three times a day (TID) | ORAL | Status: DC
  Administered 2014-06-18 – 2014-06-19 (×3): 600 mg via ORAL
  Filled 2014-06-18 (×5): qty 2

## 2014-06-18 SURGICAL SUPPLY — 66 items
BAG DECANTER FOR FLEXI CONT (MISCELLANEOUS) ×3 IMPLANT
BENZOIN TINCTURE PRP APPL 2/3 (GAUZE/BANDAGES/DRESSINGS) ×3 IMPLANT
BLADE CLIPPER SURG (BLADE) ×3 IMPLANT
BONE EQUIVA 10CC (Bone Implant) ×3 IMPLANT
BRUSH SCRUB EZ PLAIN DRY (MISCELLANEOUS) ×3 IMPLANT
BUR CUTTER 7.0 ROUND (BURR) ×3 IMPLANT
BUR MATCHSTICK NEURO 3.0 LAGG (BURR) ×3 IMPLANT
CANISTER SUCT 3000ML PPV (MISCELLANEOUS) ×3 IMPLANT
CONT SPEC 4OZ CLIKSEAL STRL BL (MISCELLANEOUS) ×6 IMPLANT
COVER BACK TABLE 24X17X13 BIG (DRAPES) IMPLANT
COVER BACK TABLE 60X90IN (DRAPES) ×3 IMPLANT
DRAPE C-ARM 42X72 X-RAY (DRAPES) ×6 IMPLANT
DRAPE LAPAROTOMY 100X72X124 (DRAPES) ×3 IMPLANT
DRAPE POUCH INSTRU U-SHP 10X18 (DRAPES) ×3 IMPLANT
DRAPE SURG 17X23 STRL (DRAPES) ×6 IMPLANT
DRSG OPSITE POSTOP 4X8 (GAUZE/BANDAGES/DRESSINGS) ×3 IMPLANT
DRSG TELFA 3X8 NADH (GAUZE/BANDAGES/DRESSINGS) ×3 IMPLANT
ELECT BLADE 4.0 EZ CLEAN MEGAD (MISCELLANEOUS) ×3
ELECT REM PT RETURN 9FT ADLT (ELECTROSURGICAL) ×3
ELECTRODE BLDE 4.0 EZ CLN MEGD (MISCELLANEOUS) ×1 IMPLANT
ELECTRODE REM PT RTRN 9FT ADLT (ELECTROSURGICAL) ×1 IMPLANT
EVACUATOR 1/8 PVC DRAIN (DRAIN) ×3 IMPLANT
GAUZE SPONGE 4X4 12PLY STRL (GAUZE/BANDAGES/DRESSINGS) ×3 IMPLANT
GAUZE SPONGE 4X4 16PLY XRAY LF (GAUZE/BANDAGES/DRESSINGS) ×3 IMPLANT
GLOVE BIOGEL PI IND STRL 7.0 (GLOVE) ×2 IMPLANT
GLOVE BIOGEL PI IND STRL 7.5 (GLOVE) ×1 IMPLANT
GLOVE BIOGEL PI INDICATOR 7.0 (GLOVE) ×4
GLOVE BIOGEL PI INDICATOR 7.5 (GLOVE) ×2
GLOVE ECLIPSE 7.0 STRL STRAW (GLOVE) ×3 IMPLANT
GLOVE ECLIPSE 8.0 STRL XLNG CF (GLOVE) ×6 IMPLANT
GLOVE SS N UNI LF 7.0 STRL (GLOVE) ×9 IMPLANT
GOWN STRL REUS W/ TWL LRG LVL3 (GOWN DISPOSABLE) ×1 IMPLANT
GOWN STRL REUS W/ TWL XL LVL3 (GOWN DISPOSABLE) ×2 IMPLANT
GOWN STRL REUS W/TWL 2XL LVL3 (GOWN DISPOSABLE) ×6 IMPLANT
GOWN STRL REUS W/TWL LRG LVL3 (GOWN DISPOSABLE) ×2
GOWN STRL REUS W/TWL XL LVL3 (GOWN DISPOSABLE) ×4
K-WIRE NITHNOL TROCAR TIP (WIRE) ×12 IMPLANT
KIT BASIN OR (CUSTOM PROCEDURE TRAY) ×3 IMPLANT
KIT ROOM TURNOVER OR (KITS) ×3 IMPLANT
LIQUID BAND (GAUZE/BANDAGES/DRESSINGS) ×3 IMPLANT
NEEDLE HYPO 22GX1.5 SAFETY (NEEDLE) ×3 IMPLANT
NEEDLE TARGETING (NEEDLE) ×12 IMPLANT
NS IRRIG 1000ML POUR BTL (IV SOLUTION) ×3 IMPLANT
PACK LAMINECTOMY NEURO (CUSTOM PROCEDURE TRAY) ×3 IMPLANT
PAD ARMBOARD 7.5X6 YLW CONV (MISCELLANEOUS) ×9 IMPLANT
PATTIES SURGICAL .75X.75 (GAUZE/BANDAGES/DRESSINGS) ×3 IMPLANT
PEEK CAGE OPTIMA 10X22X12MM (Cage) ×3 IMPLANT
ROD BENT PERC 35MM (Rod) ×6 IMPLANT
SCREW MIN INVASIVE 6.5X35 (Screw) ×6 IMPLANT
SCREW POLYAXIA MIS 6.5X40MM (Screw) ×6 IMPLANT
SHEATH PAT (SHEATH) ×3 IMPLANT
SPONGE LAP 4X18 X RAY DECT (DISPOSABLE) IMPLANT
SPONGE SURGIFOAM ABS GEL 100 (HEMOSTASIS) ×3 IMPLANT
STAPLER SKIN PROX WIDE 3.9 (STAPLE) ×3 IMPLANT
SUT VIC AB 0 CT1 18XCR BRD8 (SUTURE) ×1 IMPLANT
SUT VIC AB 0 CT1 8-18 (SUTURE) ×2
SUT VIC AB 2-0 OS6 18 (SUTURE) ×9 IMPLANT
SUT VIC AB 3-0 CP2 18 (SUTURE) ×3 IMPLANT
SYR 20ML ECCENTRIC (SYRINGE) ×3 IMPLANT
TAPE STRIPS DRAPE STRL (GAUZE/BANDAGES/DRESSINGS) ×3 IMPLANT
TOP CLSR SEQUOIA (Orthopedic Implant) ×12 IMPLANT
TOWEL OR 17X24 6PK STRL BLUE (TOWEL DISPOSABLE) ×3 IMPLANT
TOWEL OR 17X26 10 PK STRL BLUE (TOWEL DISPOSABLE) ×3 IMPLANT
TRAP SPECIMEN MUCOUS 40CC (MISCELLANEOUS) IMPLANT
TRAY FOLEY CATH 14FRSI W/METER (CATHETERS) ×3 IMPLANT
WATER STERILE IRR 1000ML POUR (IV SOLUTION) ×3 IMPLANT
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
## 2014-06-18 NOTE — Transfer of Care (Signed)
Immediate Anesthesia Transfer of Care Note  Patient: Ryan [name]  Procedure(s) Performed: Procedure(s) with comments: TRANSFORAMINAL LUMBAR INTERBODY FUSION (TLIF) WITH PEDICLE SCREW FIXATION 1 LEVEL LUMBAR 5 -SACRAL 1 (Right) - Right transforaminal lumbar interbody fusion with interbody prosthesis and percutaneous pedicle screws Lumbar 5 to Sacral 1  Patient Location: PACU  Anesthesia Type:General  Level of Consciousness: awake  Airway & Oxygen Therapy: Patient Spontanous Breathing and Patient connected to nasal cannula oxygen  Post-op Assessment: Report given to RN and Patient moving all extremities X 4  Post vital signs: Reviewed and stable  Last Vitals:  Filed Vitals:   06/18/14 0628  BP: 102/58  Pulse: 63  Temp: 36.5 C  Resp: 18    Complications: No apparent anesthesia complications

## 2014-06-18 NOTE — Anesthesia Procedure Notes (Signed)
Procedure Name: Intubation Date/Time: 06/18/2014 7:38 AM Performed by: De NurseENNIE, Taryll Reichenberger E Pre-anesthesia Checklist: Patient identified, Emergency Drugs available, Suction available, Patient being monitored and Timeout performed Patient Re-evaluated:Patient Re-evaluated prior to inductionOxygen Delivery Method: Circle system utilized Preoxygenation: Pre-oxygenation with 100% oxygen Intubation Type: IV induction Ventilation: Mask ventilation without difficulty Laryngoscope Size: Mac and 3 Grade View: Grade I Tube type: Oral Tube size: 7.5 mm Number of attempts: 1 Airway Equipment and Method: Stylet Placement Confirmation: ETT inserted through vocal cords under direct vision,  positive ETCO2 and breath sounds checked- equal and bilateral Secured at: 23 cm Tube secured with: Tape Dental Injury: Teeth and Oropharynx as per pre-operative assessment

## 2014-06-18 NOTE — Op Note (Signed)
Preop diagnosis: Degenerative disease and herniated disc L5-S1, intracanal foraminal, and extraforaminal with both L5 and S1 nerve root compression, right Postop diagnosis: Same Procedure: Right L5-S1 decompressive laminectomy with decompression of both L5 and S1 nerve roots Right L5-S1 micro-discectomy for herniated disc Right transverse lumbar interbody fusion L5-S1 with 10 x 11 x 34 mm graft L5-S1 posterolateral fusion L5-S1 nonsegmental instrumentation with Pathfinder pedicle screw system Surgeon: Natalyia Innes Asst.: Nundkumar  After being placed the prone position the patient's back was prepped and draped in the usual sterile fashion. Localizing x-rays taken prior to incision to identify the appropriate level. Midline incision was made above the spinous processes of L5 and S1. The incision was carried on the spinous processes. Subperiosteal dissection was then carried out along the right side of the spinous processes lamina facet joint and self-retaining retractor was placed for exposure. X-ray showed approach the appropriate level. Using the high-speed drill inferior 80% of the L5 lamina the medial three quarters of the facet joint the superior one third of the S1 segment were removed. Residual bone and hypertrophic ligamentum flavum and scar tissue within the foramen were removed to decompress both the L5 and S1 nerve roots more so than needed for interbody fusion. The disc space was then identified and found to be sniffly herniated diffusely both in the canal and extraforaminal he was incised and thoroughly cleaned out with a variety of pituitary rongeurs and curettes. The disc space was then prepared for interbody fusion and distracted up to a 10 mm size which we felt was a good choice. Thorough disc space cleanout was carried out while the same time great care was taken to avoid injury to the neural elements this was successfully done. At this time a 10 x 11 x 34 mm cage was chosen and filled with a  mixture of autologous bone and morselized allograft. After placing the same mixture deep within the interspace to help with interbody fusion the spacer was impacted without difficulty and fossae found to be in good position. It was kicked into a more transverse position. Fluoroscopy in AP lateral direction looked excellent. We irrigated the wound copiously controlled any bleeding with upper coagulation Gelfoam. We decorticated the far lateral region placed a mixture of autologous bone morselized allograft along their for posterolateral fusion. We then closed the midline incision with a variety of micro-on the muscle fascia subcutaneous and subcuticular tissues. We then placed percutaneous pedicle screws in standard fashion. We made incision appropriate place past Jamshidi needle from lateral to medial direction through the pedicles of L5 and S1 bilaterally. We then placed guidewires the Jamshidi needles. We incised the fascia bilaterally and then tapped with a 6 mm tap and then placed 6.5 mm x 40 mm screws at L5 and 6.5 x 35 mm screws at S1. This was done without difficulty. We then placed appropriate length rods down the towers and secured them to the top of the screws with top loading nuts. We did tightening and final tightening with torque and counter torque and then remove the towers. Final fluoroscopy in AP lateral direction looked excellent. We irrigated these wounds and then closed them with bicoronal on the fascia subcutaneous and subcuticular tissues. Dermabond and Steri-Strips were placed over all 3 incisions sterile dressing was then applied. The patient was extubated and taken to recovery room in stable condition.

## 2014-06-18 NOTE — OR Nursing (Signed)
Upon talking to the patient in the holding area, I observed that the consent form did not state a spinal level for the procedure. I asked Dr. Gerlene FeeKritzer to clarify which spinal level the procedure was for. He said lumbar 5 to sacral 1. I wrote on the consent form in the procedure area which spinal level was being operated on. I did this in the patient's presence and showed him the addition to the consent form. I initialed the addition with my rationale for adding to the consent form. This was done before the patient received any sedative. The spinal level was also discussed during the timeout prior to incision.

## 2014-06-18 NOTE — Anesthesia Postprocedure Evaluation (Signed)
  Anesthesia Post-op Note  Patient: Ryan [name]  Procedure(s) Performed: Procedure(s) (LRB): TRANSFORAMINAL LUMBAR INTERBODY FUSION (TLIF) WITH PEDICLE SCREW FIXATION 1 LEVEL LUMBAR 5 -SACRAL 1 (Right)  Patient Location: PACU  Anesthesia Type: General  Level of Consciousness: awake and alert   Airway and Oxygen Therapy: Patient Spontanous Breathing  Post-op Pain: mild  Post-op Assessment: Post-op Vital signs reviewed, Patient's Cardiovascular Status Stable, Respiratory Function Stable, Patent Airway and No signs of Nausea or vomiting  Last Vitals:  Filed Vitals:   06/18/14 1143  BP: 123/74  Pulse: 88  Temp:   Resp: 15    Post-op Vital Signs: stable   Complications: No apparent anesthesia complications

## 2014-06-19 MED ORDER — OXYCODONE-ACETAMINOPHEN 5-325 MG PO TABS: 1.0000 | ORAL_TABLET | ORAL | Status: DC | PRN

## 2014-06-19 NOTE — Discharge Summary (Signed)
  Physician Discharge Summary  Patient ID: Ryan [name] MRN: [id number] DOB/AGE: 1955/10/04 59 y.o.  Admit date: 06/18/2014 Discharge date: 06/19/2014  Admission Diagnoses:  Discharge Diagnoses:  Active Problems:   Lumbar herniated disc   Discharged Condition: good  Hospital Course: Surgery yesterday for right sided tlif. Did great with marked improvement in right leg pain. Ambulated well. Home pod 1, specific instructions given.  Consults: None  Significant Diagnostic Studies: none  Treatments: surgery: Right L5S1 tlif  Discharge Exam: Blood pressure 137/82, pulse 84, temperature 98.3 F (36.8 C), temperature source Oral, resp. rate 20, height 5\' 7"  (1.702 m), weight 79.833 kg (176 lb), SpO2 99 %. Incision/Wound:clean and dry; no new neuro issues  Disposition:      Medication List    ASK your doctor about these medications        acetaminophen 325 MG tablet  Commonly known as:  TYLENOL  Take 650 mg by mouth every 6 (six) hours as needed for mild pain or headache.     albuterol 108 (90 BASE) MCG/ACT inhaler  Commonly known as:  PROVENTIL HFA;VENTOLIN HFA  Inhale 1-2 puffs into the lungs every 6 (six) hours as needed for wheezing or shortness of breath.     cefUROXime 250 MG tablet  Commonly known as:  CEFTIN  Take 1 tablet (250 mg total) by mouth 2 (two) times daily with a meal.     cetirizine 10 MG tablet  Commonly known as:  ZYRTEC  Take 10 mg by mouth daily.     gabapentin 300 MG capsule  Commonly known as:  NEURONTIN  Take 600 mg by mouth 3 (three) times daily.     lisinopril 20 MG tablet  Commonly known as:  PRINIVIL,ZESTRIL  Take 20 mg by mouth daily.     sildenafil 100 MG tablet  Commonly known as:  VIAGRA  Take 100 mg by mouth as needed for erectile dysfunction.     tamsulosin 0.4 MG Caps capsule  Commonly known as:  FLOMAX  Take 0.8 mg by mouth daily.         At home rest most of the time. Get up 9 or 10 times each day and take a 15 or  20 minute walk. No riding in the car and to your first postoperative appointment. If you have neck surgery you may shower from the chest down starting on the third postoperative day. If you had back surgery he may start showering on the third postoperative day with saran wrap wrapped around your incisional area 3 times. After the shower remove the saran wrap. Take pain medicine as needed and other medications as instructed. Call my office for an appointment.  SignedReinaldo Meeker: Harlea Goetzinger O, MD 06/19/2014, 11:54 AM

## 2014-06-19 NOTE — Progress Notes (Signed)
Discharge instructions given. Pt verbalized understanding and all were answered.  

## 2014-06-25 ENCOUNTER — Encounter (HOSPITAL_COMMUNITY): Payer: Self-pay | Admitting: Neurosurgery

## 2014-06-30 ENCOUNTER — Other Ambulatory Visit: Payer: Self-pay | Admitting: Pain Medicine

## 2014-06-30 DIAGNOSIS — M5137 Other intervertebral disc degeneration, lumbosacral region: Secondary | ICD-10-CM

## 2014-06-30 DIAGNOSIS — M5416 Radiculopathy, lumbar region: Secondary | ICD-10-CM

## 2014-06-30 DIAGNOSIS — M48062 Spinal stenosis, lumbar region with neurogenic claudication: Secondary | ICD-10-CM

## 2014-06-30 DIAGNOSIS — M5126 Other intervertebral disc displacement, lumbar region: Secondary | ICD-10-CM

## 2014-07-04 ENCOUNTER — Ambulatory Visit: Payer: Medicaid Other | Admitting: Pain Medicine

## 2014-07-11 DIAGNOSIS — G4700 Insomnia, unspecified: Secondary | ICD-10-CM | POA: Insufficient documentation

## 2015-02-11 DIAGNOSIS — H9209 Otalgia, unspecified ear: Secondary | ICD-10-CM | POA: Insufficient documentation

## 2015-03-26 ENCOUNTER — Encounter: Payer: Self-pay | Admitting: *Deleted

## 2015-03-26 NOTE — Discharge Instructions (Signed)
[facility] °DISCHARGE INSTRUCTIONS FOR MYRINGOTOMY AND TUBE INSERTION ° °[facility] °PAUL JUENGEL, M.D. °CHAPMAN T. MCQUEEN, M.D. °SCOTT BENNETT, M.D. °CREIGHTON VAUGHT, M.D. ° °Diet:   After surgery, the patient should take only liquids and foods as tolerated.  The patient may then have a regular diet after the effects of anesthesia have worn off, usually about four to six hours after surgery. ° °Activities:   The patient should rest until the effects of anesthesia have worn off.  After this, there are no restrictions on the normal daily activities. ° °Medications:   You will be given antibiotic drops to be used in the ears postoperatively.  It is recommended to use 4 drops 2 times a day for 4 days, then the drops should be saved for possible future use. ° °The tubes should not cause any discomfort to the patient, but if there is any question, Tylenol should be given according to the instructions for the age of the patient. ° °Other medications should be continued normally. ° °Precautions:   Should there be recurrent drainage after the tubes are placed, the drops should be used for approximately 3-4 days.  If it does not clear, you should call the ENT office. ° °Earplugs:   Earplugs are only needed for those who are going to be submerged under water.  When taking a bath or shower and using a cup or showerhead to rinse hair, it is not necessary to wear earplugs.  These come in a variety of fashions, all of which can be obtained at our office.  However, if one is not able to come by the office, then silicone plugs can be found at most pharmacies.  It is not advised to stick anything in the ear that is not approved as an earplug.  Silly putty is not to be used as an earplug.  Swimming is allowed in patients after ear tubes are inserted, however, they must wear earplugs if they are going to be submerged under water.  For those children who are going to be swimming a lot, it is  recommended to use a fitted ear mold, which can be made by our audiologist.  If discharge is noticed from the ears, this most likely represents an ear infection.  We would recommend getting your eardrops and using them as indicated above.  If it does not clear, then you should call the ENT office.  For follow up, the patient should return to the ENT office three weeks postoperatively and then every six months as required by the doctor. ° ° °General Anesthesia, Adult, Care After °Refer to this sheet in the next few weeks. These instructions provide you with information on caring for yourself after your procedure. Your health care provider may also give you more specific instructions. Your treatment has been planned according to current medical practices, but problems sometimes occur. Call your health care provider if you have any problems or questions after your procedure. °WHAT TO EXPECT AFTER THE PROCEDURE °After the procedure, it is typical to experience: °· Sleepiness. °· Nausea and vomiting. °HOME CARE INSTRUCTIONS °· For the first 24 hours after general anesthesia: °¨ Have a responsible person with you. °¨ Do not drive a car. If you are alone, do not take public transportation. °¨ Do not drink alcohol. °¨ Do not take medicine that has not been prescribed by your health care provider. °¨ Do not sign important papers or make important decisions. °¨ You may resume a   normal diet and activities as directed by your health care provider. °· Change bandages (dressings) as directed. °· If you have questions or problems that seem related to general anesthesia, call the hospital and ask for the anesthetist or anesthesiologist on call. °SEEK MEDICAL CARE IF: °· You have nausea and vomiting that continue the day after anesthesia. °· You develop a rash. °SEEK IMMEDIATE MEDICAL CARE IF:  °· You have difficulty breathing. °· You have chest pain. °· You have any allergic problems. °  °This information is not intended to replace  advice given to you by your health care provider. Make sure you discuss any questions you have with your health care provider. °  °Document Released: 04/19/2000 Document Revised: 02/01/2014 Document Reviewed: 05/12/2011 °Elsevier Interactive Patient Education ©2016 [employer] ° °

## 2015-03-28 ENCOUNTER — Encounter
Admission: RE | Disposition: A | Discharge status: Home / Self Care | Payer: Self-pay | Source: Ambulatory Visit | Attending: Unknown Physician Specialty

## 2015-03-28 ENCOUNTER — Ambulatory Visit: Payer: Medicaid Other | Admitting: Anesthesiology

## 2015-03-28 ENCOUNTER — Ambulatory Visit
Admission: RE | Admit: 2015-03-28 | Discharge: 2015-03-28 | Disposition: A | Discharge status: Home / Self Care | Payer: Medicaid Other | Source: Ambulatory Visit | Attending: Unknown Physician Specialty | Admitting: Unknown Physician Specialty

## 2015-03-28 DIAGNOSIS — M5137 Other intervertebral disc degeneration, lumbosacral region: Secondary | ICD-10-CM | POA: Diagnosis not present

## 2015-03-28 DIAGNOSIS — H6522 Chronic serous otitis media, left ear: Secondary | ICD-10-CM | POA: Insufficient documentation

## 2015-03-28 DIAGNOSIS — Z7951 Long term (current) use of inhaled steroids: Secondary | ICD-10-CM | POA: Insufficient documentation

## 2015-03-28 DIAGNOSIS — J45909 Unspecified asthma, uncomplicated: Secondary | ICD-10-CM | POA: Insufficient documentation

## 2015-03-28 DIAGNOSIS — K219 Gastro-esophageal reflux disease without esophagitis: Secondary | ICD-10-CM | POA: Diagnosis not present

## 2015-03-28 DIAGNOSIS — M1990 Unspecified osteoarthritis, unspecified site: Secondary | ICD-10-CM | POA: Diagnosis not present

## 2015-03-28 DIAGNOSIS — H9190 Unspecified hearing loss, unspecified ear: Secondary | ICD-10-CM | POA: Diagnosis not present

## 2015-03-28 DIAGNOSIS — F419 Anxiety disorder, unspecified: Secondary | ICD-10-CM | POA: Insufficient documentation

## 2015-03-28 DIAGNOSIS — F17210 Nicotine dependence, cigarettes, uncomplicated: Secondary | ICD-10-CM | POA: Diagnosis not present

## 2015-03-28 DIAGNOSIS — I10 Essential (primary) hypertension: Secondary | ICD-10-CM | POA: Insufficient documentation

## 2015-03-28 DIAGNOSIS — Z79899 Other long term (current) drug therapy: Secondary | ICD-10-CM | POA: Insufficient documentation

## 2015-03-28 DIAGNOSIS — H6531 Chronic mucoid otitis media, right ear: Secondary | ICD-10-CM | POA: Insufficient documentation

## 2015-03-28 DIAGNOSIS — E785 Hyperlipidemia, unspecified: Secondary | ICD-10-CM | POA: Insufficient documentation

## 2015-03-28 DIAGNOSIS — J449 Chronic obstructive pulmonary disease, unspecified: Secondary | ICD-10-CM | POA: Insufficient documentation

## 2015-03-28 HISTORY — PX: MYRINGOTOMY WITH TUBE PLACEMENT: SHX5663

## 2015-03-28 HISTORY — DX: Presence of external hearing-aid: Z97.4

## 2015-03-28 HISTORY — DX: Unspecified osteoarthritis, unspecified site: M19.90

## 2015-03-28 HISTORY — DX: Presence of dental prosthetic device (complete) (partial): Z97.2

## 2015-03-28 SURGERY — MYRINGOTOMY WITH TUBE PLACEMENT
Anesthesia: General | Laterality: Bilateral | Wound class: Clean Contaminated

## 2015-03-28 MED ORDER — MIDAZOLAM HCL 5 MG/5ML IJ SOLN
INTRAMUSCULAR | Status: DC | PRN
  Administered 2015-03-28: 2 mg via INTRAVENOUS

## 2015-03-28 MED ORDER — LACTATED RINGERS IV SOLN
INTRAVENOUS | Status: DC
  Administered 2015-03-28: 09:00:00 via INTRAVENOUS

## 2015-03-28 MED ORDER — GLYCOPYRROLATE 0.2 MG/ML IJ SOLN
INTRAMUSCULAR | Status: DC | PRN
  Administered 2015-03-28: 0.1 mg via INTRAVENOUS

## 2015-03-28 MED ORDER — OFLOXACIN 0.3 % OT SOLN
OTIC | Status: DC | PRN
  Administered 2015-03-28: 3 [drp] via OTIC

## 2015-03-28 MED ORDER — PROPOFOL 10 MG/ML IV BOLUS
INTRAVENOUS | Status: DC | PRN
  Administered 2015-03-28: 140 mg via INTRAVENOUS

## 2015-03-28 MED ORDER — OXYCODONE HCL 5 MG/5ML PO SOLN: 5.0000 mg | Freq: Once | ORAL | Status: DC | PRN

## 2015-03-28 MED ORDER — ONDANSETRON HCL 4 MG/2ML IJ SOLN
INTRAMUSCULAR | Status: DC | PRN
  Administered 2015-03-28: 4 mg via INTRAVENOUS

## 2015-03-28 MED ORDER — OXYCODONE HCL 5 MG PO TABS: 5.0000 mg | ORAL_TABLET | Freq: Once | ORAL | Status: DC | PRN

## 2015-03-28 SURGICAL SUPPLY — 11 items
BLADE MYR LANCE NRW W/HDL (BLADE) ×3 IMPLANT
CANISTER SUCT 1200ML W/VALVE (MISCELLANEOUS) ×3 IMPLANT
COTTONBALL LRG STERILE PKG (GAUZE/BANDAGES/DRESSINGS) ×3 IMPLANT
GLOVE BIO SURGEON STRL SZ7.5 (GLOVE) ×3 IMPLANT
STRAP BODY AND KNEE 60X3 (MISCELLANEOUS) ×3 IMPLANT
TOWEL OR 17X26 4PK STRL BLUE (TOWEL DISPOSABLE) ×3 IMPLANT
TUBE EAR ARMSTRONG HC 1.14X3.5 (OTOLOGIC RELATED) IMPLANT
TUBE EAR T 1.27X4.5 GO LF (OTOLOGIC RELATED) IMPLANT
TUBE EAR T 1.27X5.3 BFLY (OTOLOGIC RELATED) ×6 IMPLANT
TUBING CONN 6MMX3.1M (TUBING) ×2
TUBING SUCTION CONN 0.25 STRL (TUBING) ×1 IMPLANT

## 2015-03-28 NOTE — Transfer of Care (Signed)
Immediate Anesthesia Transfer of Care Note  Patient: Ryan [name]  Procedure(s) Performed: Procedure(s) with comments: MYRINGOTOMY WITH TUBE PLACEMENT (Bilateral) - BUTTERFLY TUBE  Patient Location: PACU  Anesthesia Type: General  Level of Consciousness: awake, alert  and patient cooperative  Airway and Oxygen Therapy: Patient Spontanous Breathing and Patient connected to supplemental oxygen  Post-op Assessment: Post-op Vital signs reviewed, Patient's Cardiovascular Status Stable, Respiratory Function Stable, Patent Airway and No signs of Nausea or vomiting  Post-op Vital Signs: Reviewed and stable  Complications: No apparent anesthesia complications

## 2015-03-28 NOTE — Anesthesia Postprocedure Evaluation (Signed)
Anesthesia Post Note  Patient: Ryan [name]  Procedure(s) Performed: Procedure(s) (LRB): MYRINGOTOMY WITH TUBE PLACEMENT (Bilateral)  Patient location during evaluation: PACU Anesthesia Type: General Level of consciousness: awake and alert Pain management: pain level controlled Vital Signs Assessment: post-procedure vital signs reviewed and stable Respiratory status: spontaneous breathing, nonlabored ventilation, respiratory function stable and patient connected to nasal cannula oxygen Cardiovascular status: blood pressure returned to baseline and stable Postop Assessment: no signs of nausea or vomiting Anesthetic complications: no    Kawana Hegel

## 2015-03-28 NOTE — Anesthesia Preprocedure Evaluation (Signed)
Anesthesia Evaluation  Patient identified by MRN, date of birth, ID band Patient awake    Reviewed: Allergy & Precautions, NPO status , Patient's Chart, lab work & pertinent test results  History of Anesthesia Complications Negative for: history of anesthetic complications  Airway Mallampati: II  TM Distance: >3 FB Neck ROM: Full    Dental  (+) Dental Advisory Given, Edentulous Upper, Partial Lower, Poor Dentition,  Many missing:   Pulmonary asthma , COPD, Current Smoker,    Pulmonary exam normal breath sounds clear to auscultation       Cardiovascular Exercise Tolerance: Good hypertension, Pt. on medications Normal cardiovascular exam  Ekg: 05/2014 Normal sinus rhythm Possible Left atrial enlargement Left anterior fascicular block Left ventricular hypertrophy;    Neuro/Psych Anxiety Back surgery in may 2016; DDD (degenerative disc disease), lumbosacral;  HOH > hearing aid   negative neurological ROS  negative psych ROS   GI/Hepatic Neg liver ROS, GERD  Medicated and Controlled,  Endo/Other  negative endocrine ROS  Renal/GU negative Renal ROS   bph    Musculoskeletal  (+) Arthritis , Osteoarthritis,    Abdominal   Peds negative pediatric ROS (+)  Hematology negative hematology ROS (+)   Anesthesia Other Findings   Reproductive/Obstetrics negative OB ROS                             Anesthesia Physical Anesthesia Plan  ASA: II  Anesthesia Plan: General   Post-op Pain Management:    Induction:   Airway Management Planned:   Additional Equipment:   Intra-op Plan:   Post-operative Plan:   Informed Consent:   Plan Discussed with:   Anesthesia Plan Comments:         Anesthesia Quick Evaluation

## 2015-03-28 NOTE — H&P (Signed)
  H+P  Reviewed and will be scanned in later. No changes noted. 

## 2015-03-28 NOTE — Anesthesia Procedure Notes (Signed)
Performed by: Trigger Frasier Pre-anesthesia Checklist: Patient identified, Emergency Drugs available, Suction available, Timeout performed and Patient being monitored Patient Re-evaluated:Patient Re-evaluated prior to inductionOxygen Delivery Method: Circle system utilized Preoxygenation: Pre-oxygenation with 100% oxygen Intubation Type: Inhalational induction Ventilation: Mask ventilation without difficulty and Mask ventilation throughout procedure Dental Injury: Teeth and Oropharynx as per pre-operative assessment        

## 2015-03-28 NOTE — Op Note (Signed)
03/28/2015  10:18 AM    [name], Ryan [name]  [id number]   Pre-Op Dx: Otitis Media  Post-op Dx: Same  Proc:Bilateral myringotomy with tubes  Surg: Ryan [name]  Anes:  General by mask  EBL:  None  Findings:  R-glue ear post retraction, L-glue  Procedure: With the patient in a comfortable supine position, general mask anesthesia was administered.  At an appropriate level, microscope and speculum were used to examine and clean the RIGHT ear canal.  The findings were as described above.  An anterior inferior radial myringotomy incision was sharply executed.  Middle ear contents were suctioned clear.  A butterfly PE tube was placed without difficulty.  floxin otic solution was instilled into the external canal, and insufflated into the middle ear.  A cotton ball was placed at the external meatus. Hemostasis was observed.  This side was completed.  After completing the RIGHT side, the LEFT side was done in identical fashion.    Following this  The patient was returned to anesthesia, awakened, and transferred to recovery in stable condition.  Dispo:  PACU to home  Plan: Routine drop use and water precautions.  Recheck my office three weeks.   Ryan [name]  10:18 AM  03/28/2015

## 2015-03-31 ENCOUNTER — Encounter: Payer: Self-pay | Admitting: Unknown Physician Specialty

## 2015-07-09 ENCOUNTER — Emergency Department
Admission: EM | Admit: 2015-07-09 | Discharge: 2015-07-09 | Disposition: A | Discharge status: Home / Self Care | Payer: Medicaid Other | Attending: Student | Admitting: Student

## 2015-07-09 ENCOUNTER — Encounter: Payer: Self-pay | Admitting: *Deleted

## 2015-07-09 ENCOUNTER — Emergency Department: Payer: Medicaid Other

## 2015-07-09 DIAGNOSIS — M545 Low back pain, unspecified: Secondary | ICD-10-CM

## 2015-07-09 DIAGNOSIS — M5416 Radiculopathy, lumbar region: Secondary | ICD-10-CM | POA: Insufficient documentation

## 2015-07-09 DIAGNOSIS — N189 Chronic kidney disease, unspecified: Secondary | ICD-10-CM | POA: Insufficient documentation

## 2015-07-09 DIAGNOSIS — J449 Chronic obstructive pulmonary disease, unspecified: Secondary | ICD-10-CM | POA: Insufficient documentation

## 2015-07-09 DIAGNOSIS — J45909 Unspecified asthma, uncomplicated: Secondary | ICD-10-CM | POA: Diagnosis not present

## 2015-07-09 DIAGNOSIS — I129 Hypertensive chronic kidney disease with stage 1 through stage 4 chronic kidney disease, or unspecified chronic kidney disease: Secondary | ICD-10-CM | POA: Insufficient documentation

## 2015-07-09 DIAGNOSIS — E785 Hyperlipidemia, unspecified: Secondary | ICD-10-CM | POA: Insufficient documentation

## 2015-07-09 DIAGNOSIS — F17210 Nicotine dependence, cigarettes, uncomplicated: Secondary | ICD-10-CM | POA: Insufficient documentation

## 2015-07-09 DIAGNOSIS — M5136 Other intervertebral disc degeneration, lumbar region: Secondary | ICD-10-CM | POA: Diagnosis not present

## 2015-07-09 DIAGNOSIS — Z7951 Long term (current) use of inhaled steroids: Secondary | ICD-10-CM | POA: Diagnosis not present

## 2015-07-09 DIAGNOSIS — M1990 Unspecified osteoarthritis, unspecified site: Secondary | ICD-10-CM | POA: Insufficient documentation

## 2015-07-09 DIAGNOSIS — Z79899 Other long term (current) drug therapy: Secondary | ICD-10-CM | POA: Diagnosis not present

## 2015-07-09 MED ORDER — KETOROLAC TROMETHAMINE 60 MG/2ML IM SOLN
30.0000 mg | Freq: Once | INTRAMUSCULAR | Status: AC
  Administered 2015-07-09: 30 mg via INTRAMUSCULAR
  Filled 2015-07-09: qty 2

## 2015-07-09 MED ORDER — HYDROCODONE-ACETAMINOPHEN 5-325 MG PO TABS: 1.0000 | ORAL_TABLET | ORAL | Status: DC | PRN

## 2015-07-09 MED ORDER — DIAZEPAM 5 MG PO TABS
5.0000 mg | ORAL_TABLET | Freq: Once | ORAL | Status: AC
  Administered 2015-07-09: 5 mg via ORAL
  Filled 2015-07-09: qty 1

## 2015-07-09 MED ORDER — MELOXICAM 15 MG PO TABS: 15.0000 mg | ORAL_TABLET | Freq: Every day | ORAL | Status: DC

## 2015-07-09 MED ORDER — OXYCODONE HCL 5 MG PO TABS
5.0000 mg | ORAL_TABLET | Freq: Once | ORAL | Status: AC
  Administered 2015-07-09: 5 mg via ORAL
  Filled 2015-07-09: qty 1

## 2015-07-09 MED ORDER — HYDROMORPHONE HCL 1 MG/ML IJ SOLN
1.0000 mg | Freq: Once | INTRAMUSCULAR | Status: AC
  Administered 2015-07-09: 1 mg via INTRAMUSCULAR
  Filled 2015-07-09: qty 1

## 2015-07-09 NOTE — ED Notes (Signed)
Pt to triage via wheelchair.  Pt has lower back pain.  States he twisted it today while putting grandson in a headlock.  Pt did not fall.  Pt had back surgery 1 year ago. Pt alert.

## 2015-07-09 NOTE — Discharge Instructions (Signed)

## 2015-07-09 NOTE — ED Provider Notes (Signed)
[facility] Emergency Department Provider Note ____________________________________________  Time seen: Approximately 6:46 PM  I have reviewed the triage vital signs and the nursing notes.   HISTORY  Chief Complaint Back Pain    HPI Ryan [name] is a 60 y.o. male who presents to the emergency department for evaluation oflower back pain. He states that he had to put his grandson and I have not because he was "out of control." He did not feel pain in his back immediately after this incident, it began about an hour after he returned home. He reports having back surgery about a year ago and has not had any problems with his lower back since that time. He states that he occasionally takes Flexeril and he feels muscle spasms in his back, but otherwise has done very well since the surgery. He has not taken anything for pain since the incident.   Past Medical History  Diagnosis Date  . Hypertension   . COPD (chronic obstructive pulmonary disease) (HCC)   . Asthma   . Hyperlipidemia   . Chronic kidney disease     HAS HAD KIDNEY STONE 2015-- JUST PASSED  . GERD (gastroesophageal reflux disease)     TAKES TUMS FOR RELIEF  . Arthritis     left hand  . Wears hearing aid   . Wears dentures     hass full upper plate, not wearing, broken    Patient Active Problem List   Diagnosis Date Noted  . Lumbar herniated disc 06/18/2014  . DDD (degenerative disc disease), lumbosacral 06/02/2014  . Spinal stenosis, lumbar region, with neurogenic claudication 06/02/2014  . Lumbar radiculopathy 06/02/2014  . Airway hyperreactivity 04/19/2014  . Chronic LBP 04/19/2014  . BP (high blood pressure) 04/19/2014  . Temporary cerebral vascular dysfunction 06/06/2013  . Compulsive tobacco user syndrome 05/22/2013  . 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 Lower urinary tract symptoms 12/27/2012    Past Surgical History  Procedure Laterality Date  . Eye surgery     . Hernia repair      ERRONEOUS UMBILICAL HERNIA   2011  . Transforaminal lumbar interbody fusion (tlif) with pedicle screw fixation 1 level Right 06/18/2014    Procedure: TRANSFORAMINAL LUMBAR INTERBODY FUSION (TLIF) WITH PEDICLE SCREW FIXATION 1 LEVEL LUMBAR 5 -SACRAL 1;  Surgeon: Aliene Beamsandy Kritzer, MD;  Location: [facility];  Service: Neurosurgery;  Laterality: Right;  Right transforaminal lumbar interbody fusion with interbody prosthesis and percutaneous pedicle screws Lumbar 5 to Sacral 1  . Myringotomy with tube placement Bilateral 03/28/2015    Procedure: MYRINGOTOMY WITH TUBE PLACEMENT;  Surgeon: Linus Salmonshapman McQueen, MD;  [facility];  Service: ENT;  Laterality: Bilateral;  BUTTERFLY TUBE    Current Outpatient Rx  Name  Route  Sig  Dispense  Refill  . acetaminophen (TYLENOL) 325 MG tablet   Oral   Take 650 mg by mouth every 6 (six) hours as needed for mild pain or headache.          . albuterol (PROVENTIL HFA;VENTOLIN HFA) 108 (90 BASE) MCG/ACT inhaler   Inhalation   Inhale 1-2 puffs into the lungs every 6 (six) hours as needed for wheezing or shortness of breath.          . ALPRAZolam (XANAX) 1 MG tablet   Oral   Take 1 mg by mouth at bedtime as needed for anxiety.         . cetirizine (ZYRTEC) 10 MG tablet   Oral  Take 10 mg by mouth daily.         . cyclobenzaprine (FLEXERIL) 10 MG tablet   Oral   Take 10 mg by mouth at bedtime as needed for muscle spasms.         . fluticasone (FLONASE) 50 MCG/ACT nasal spray   Each Nare   Place into both nostrils daily.         Marland Kitchen gabapentin (NEURONTIN) 300 MG capsule   Oral   Take 600 mg by mouth 3 (three) times daily.         Marland Kitchen lisinopril (PRINIVIL,ZESTRIL) 20 MG tablet   Oral   Take 20 mg by mouth daily. Reported on 03/26/2015         . tamsulosin (FLOMAX) 0.4 MG CAPS capsule   Oral   Take 0.8 mg by mouth daily.            Allergies Review of patient's allergies indicates no known  allergies.  Family History  Problem Relation Age of Onset  . Arthritis Mother   . Hypertension Mother   . Diabetes Father   . Hypertension Father     Social History Social History  Substance Use Topics  . Smoking status: Current Every Day Smoker -- 1.50 packs/day for 40 years    Types: Cigarettes  . Smokeless tobacco: None     Comment: has cut back to 1PPD  . Alcohol Use: No     Comment: ONE OR TWICE A YR    Review of Systems Constitutional: No recent illness. Cardiovascular: Denies chest pain or palpitations. Respiratory: Denies shortness of breath. Musculoskeletal: Pain in lumbar spine. Skin: Negative for rash, wound, lesion. Neurological: Negative for focal weakness or numbness.  ____________________________________________   PHYSICAL EXAM:  VITAL SIGNS: ED Triage Vitals  Enc Vitals Group     BP 07/09/15 1739 136/74 mmHg     Pulse Rate 07/09/15 1739 79     Resp 07/09/15 1739 20     Temp 07/09/15 1739 98.6 F (37 C)     Temp Source 07/09/15 1739 Oral     SpO2 07/09/15 1739 96 %     Weight 07/09/15 1739 172 lb (78.019 kg)     Height 07/09/15 1739  (1.702 m)     Head Cir --      Peak Flow --      Pain Score 07/09/15 1740 8     Pain Loc --      Pain Edu? --      Excl. in GC? --     Constitutional: Alert and oriented. Uncomfortable appearing and tearful. Eyes: Conjunctivae are normal. EOMI. Head: Atraumatic. Neck: No stridor.  Respiratory: Normal respiratory effort.   Musculoskeletal: Midline tenderness over area of surgical scar in lumbar area. No obvious deformity.  Neurologic:  Normal speech and language. No gross focal neurologic deficits are appreciated. Speech is normal. No gait instability. Skin:  Skin is warm, dry and intact. Atraumatic. Psychiatric: Mood and affect are normal. Speech and behavior are normal.  ____________________________________________   LABS (all labs ordered are listed, but only abnormal results are displayed)  Labs  Reviewed - No data to display ____________________________________________  RADIOLOGY  No acute abnormality of lumbar spine.  I, Kem Boroughs, personally viewed and evaluated these images (plain radiographs) as part of my medical decision making, as well as reviewing the written report by the radiologist.   ____________________________________________   PROCEDURES  Procedure(s) performed: None   ____________________________________________   INITIAL IMPRESSION /  ASSESSMENT AND PLAN / ED COURSE  Pertinent labs & imaging results that were available during my care of the patient were reviewed by me and considered in my medical decision making (see chart for details).  Pain moderately controlled after IM Dilaudid and toradol plus PO valium and roxicet. Patient is to follow up with neurosurgeon or PCP for symptoms that are not improving over the week. He is to take meloxicam and Norco in addition to his flexeril as prescribed, if needed. He was advised to return to the ER for symptoms that change or worsen if unable to schedule an appointment. ____________________________________________   FINAL CLINICAL IMPRESSION(S) / ED DIAGNOSES  Final diagnoses:  Acute lumbar back pain       Chinita Pester, FNP 07/09/15 2001  Gayla Doss, MD 07/10/15 [phone]

## 2015-07-09 NOTE — ED Notes (Signed)
[facility]tt NP aware of pain

## 2015-07-09 NOTE — ED Notes (Signed)
See triage note  States his grandson was out of control   He tried to do a head lock on him and twisted his back    Unable to stand straight d/t increased pain

## 2015-09-15 DIAGNOSIS — H538 Other visual disturbances: Secondary | ICD-10-CM | POA: Insufficient documentation

## 2015-10-23 ENCOUNTER — Other Ambulatory Visit: Payer: Self-pay | Admitting: Nurse Practitioner

## 2015-10-23 ENCOUNTER — Emergency Department
Admission: EM | Admit: 2015-10-23 | Discharge: 2015-10-23 | Disposition: A | Discharge status: Home / Self Care | Payer: Medicaid Other | Attending: Emergency Medicine | Admitting: Emergency Medicine

## 2015-10-23 ENCOUNTER — Encounter: Payer: Self-pay | Admitting: *Deleted

## 2015-10-23 ENCOUNTER — Encounter: Payer: Self-pay | Admitting: Emergency Medicine

## 2015-10-23 ENCOUNTER — Emergency Department: Payer: Medicaid Other

## 2015-10-23 ENCOUNTER — Ambulatory Visit
Admission: RE | Admit: 2015-10-23 | Discharge: 2015-10-23 | Disposition: A | Discharge status: Home / Self Care | Payer: Medicaid Other | Source: Ambulatory Visit | Attending: Nurse Practitioner | Admitting: Nurse Practitioner

## 2015-10-23 DIAGNOSIS — Z79899 Other long term (current) drug therapy: Secondary | ICD-10-CM | POA: Insufficient documentation

## 2015-10-23 DIAGNOSIS — K228 Other specified diseases of esophagus: Secondary | ICD-10-CM

## 2015-10-23 DIAGNOSIS — R0989 Other specified symptoms and signs involving the circulatory and respiratory systems: Secondary | ICD-10-CM | POA: Insufficient documentation

## 2015-10-23 DIAGNOSIS — J45909 Unspecified asthma, uncomplicated: Secondary | ICD-10-CM | POA: Insufficient documentation

## 2015-10-23 DIAGNOSIS — J449 Chronic obstructive pulmonary disease, unspecified: Secondary | ICD-10-CM | POA: Diagnosis not present

## 2015-10-23 DIAGNOSIS — R1900 Intra-abdominal and pelvic swelling, mass and lump, unspecified site: Secondary | ICD-10-CM

## 2015-10-23 DIAGNOSIS — R198 Other specified symptoms and signs involving the digestive system and abdomen: Secondary | ICD-10-CM

## 2015-10-23 DIAGNOSIS — I129 Hypertensive chronic kidney disease with stage 1 through stage 4 chronic kidney disease, or unspecified chronic kidney disease: Secondary | ICD-10-CM | POA: Insufficient documentation

## 2015-10-23 DIAGNOSIS — N189 Chronic kidney disease, unspecified: Secondary | ICD-10-CM | POA: Insufficient documentation

## 2015-10-23 DIAGNOSIS — F17210 Nicotine dependence, cigarettes, uncomplicated: Secondary | ICD-10-CM | POA: Diagnosis not present

## 2015-10-23 MED ORDER — GI COCKTAIL ~~LOC~~
30.0000 mL | Freq: Once | ORAL | Status: AC
  Administered 2015-10-23: 30 mL via ORAL

## 2015-10-23 MED ORDER — GI COCKTAIL ~~LOC~~
ORAL | Status: AC
  Administered 2015-10-23: 30 mL via ORAL
  Filled 2015-10-23: qty 30

## 2015-10-23 NOTE — ED Triage Notes (Signed)
Patient ambulatory to triage with steady gait, without difficulty or distress noted; pt reports eating bacon 2-3hrs PTA and has sensation of piece still stuck in throat; pt able to swallow secretions without difficulty

## 2015-10-23 NOTE — Discharge Instructions (Signed)
Return to the emergency room he cannot tolerate liquids have increased pain fever cough trouble breathing or other new or worrisome symptoms. Follow up with GI medicine today without fail.

## 2015-10-23 NOTE — ED Provider Notes (Addendum)
[facility] Emergency Department Provider Note  ____________________________________________   I have reviewed the triage vital signs and the nursing notes.   HISTORY  Chief Complaint Foreign Body    HPI Ryan [name] is a 60 y.o. male who states he has no other significant medical problems no history of reflux disease, no history of esophageal stricture or other pathology of esophagitis. He states he was eating bacon and he had a large fecal bacon and he felt a scrape as it went down he feels that there is still a Berish piece of bacon stack in his throat. He is not having difficulty breathing, he is not having difficulty swallowing he states he is able to drink water without any difficulty and has had a bunch of water to try to make it go down.      Past Medical History:  Diagnosis Date  . Arthritis    left hand  . Asthma   . Chronic kidney disease    HAS HAD KIDNEY STONE 2015-- JUST PASSED  . COPD (chronic obstructive pulmonary disease) (HCC)   . GERD (gastroesophageal reflux disease)    TAKES TUMS FOR RELIEF  . Hyperlipidemia   . Hypertension   . Wears dentures    hass full upper plate, not wearing, broken  . Wears hearing aid     Patient Active Problem List   Diagnosis Date Noted  . Lumbar herniated disc 06/18/2014  . DDD (degenerative disc disease), lumbosacral 06/02/2014  . Spinal stenosis, lumbar region, with neurogenic claudication 06/02/2014  . Lumbar radiculopathy 06/02/2014  . Airway hyperreactivity 04/19/2014  . Chronic LBP 04/19/2014  . BP (high blood pressure) 04/19/2014  . Temporary cerebral vascular dysfunction 06/06/2013  . Compulsive tobacco user syndrome 05/22/2013  . 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 Lower urinary tract symptoms 12/27/2012    Past Surgical History:  Procedure Laterality Date  . EYE SURGERY    . HERNIA REPAIR     ERRONEOUS UMBILICAL HERNIA   2011  . MYRINGOTOMY WITH TUBE  PLACEMENT Bilateral 03/28/2015   Procedure: MYRINGOTOMY WITH TUBE PLACEMENT;  Surgeon: Linus Salmonshapman McQueen, MD;  [facility];  Service: ENT;  Laterality: Bilateral;  BUTTERFLY TUBE  . TRANSFORAMINAL LUMBAR INTERBODY FUSION (TLIF) WITH PEDICLE SCREW FIXATION 1 LEVEL Right 06/18/2014   Procedure: TRANSFORAMINAL LUMBAR INTERBODY FUSION (TLIF) WITH PEDICLE SCREW FIXATION 1 LEVEL LUMBAR 5 -SACRAL 1;  Surgeon: Aliene Beamsandy Kritzer, MD;  Location: [facility];  Service: Neurosurgery;  Laterality: Right;  Right transforaminal lumbar interbody fusion with interbody prosthesis and percutaneous pedicle screws Lumbar 5 to Sacral 1    Prior to Admission medications   Medication Sig Start Date End Date Taking? Authorizing Provider  acetaminophen (TYLENOL) 325 MG tablet Take 650 mg by mouth every 6 (six) hours as needed for mild pain or headache.     Historical Provider, MD  albuterol (PROVENTIL HFA;VENTOLIN HFA) 108 (90 BASE) MCG/ACT inhaler Inhale 1-2 puffs into the lungs every 6 (six) hours as needed for wheezing or shortness of breath.     Historical Provider, MD  ALPRAZolam Prudy Feeler(XANAX) 1 MG tablet Take 1 mg by mouth at bedtime as needed for anxiety.    Historical Provider, MD  cetirizine (ZYRTEC) 10 MG tablet Take 10 mg by mouth daily.    Historical Provider, MD  cyclobenzaprine (FLEXERIL) 10 MG tablet Take 10 mg by mouth at bedtime as needed for muscle spasms.    Historical Provider, MD  fluticasone (FLONASE) 50  MCG/ACT nasal spray Place into both nostrils daily.    Historical Provider, MD  gabapentin (NEURONTIN) 300 MG capsule Take 600 mg by mouth 3 (three) times daily.    Historical Provider, MD  HYDROcodone-acetaminophen (NORCO/VICODIN) 5-325 MG tablet Take 1 tablet by mouth every 4 (four) hours as needed for moderate pain. 07/09/15   Chinita Pester, FNP  lisinopril (PRINIVIL,ZESTRIL) 20 MG tablet Take 20 mg by mouth daily. Reported on 03/26/2015    Historical Provider, MD  meloxicam (MOBIC) 15 MG tablet  Take 1 tablet (15 mg total) by mouth daily. 07/09/15   Chinita Pester, FNP  tamsulosin (FLOMAX) 0.4 MG CAPS capsule Take 0.8 mg by mouth daily.  02/07/14   Historical Provider, MD    Allergies Review of patient's allergies indicates no known allergies.  Family History  Problem Relation Age of Onset  . Arthritis Mother   . Hypertension Mother   . Diabetes Father   . Hypertension Father     Social History Social History  Substance Use Topics  . Smoking status: Current Every Day Smoker    Packs/day: 1.50    Years: 40.00    Types: Cigarettes  . Smokeless tobacco: Never Used     Comment: has cut back to 1PPD  . Alcohol use No     Comment: ONE OR TWICE A YR    Review of Systems Constitutional: No fever/chills Eyes: No visual changes. ENT: No sore throat. No stiff neck no neck pain Cardiovascular: Denies chest pain. Respiratory: Denies shortness of breath. Gastrointestinal:   no vomiting.  No diarrhea.  No constipation. Genitourinary: Negative for dysuria. Musculoskeletal: Negative lower extremity swelling Skin: Negative for rash. Neurological: Negative for severe headaches, focal weakness or numbness. 10-point ROS otherwise negative.  ____________________________________________   PHYSICAL EXAM:  VITAL SIGNS: ED Triage Vitals  Enc Vitals Group     BP 10/23/15 0014 (!) 164/93     Pulse Rate 10/23/15 0014 74     Resp 10/23/15 0014 18     Temp 10/23/15 0014 97.9 F (36.6 C)     Temp Source 10/23/15 0014 Oral     SpO2 10/23/15 0014 95 %     Weight 10/23/15 0012 173 lb (78.5 kg)     Height 10/23/15 0012 5\' 7"  (1.702 m)     Head Circumference --      Peak Flow --      Pain Score 10/23/15 0012 6     Pain Loc --      Pain Edu? --      Excl. in GC? --     Constitutional: Alert and oriented. Well appearing and in no acute distress. Eyes: Conjunctivae are normal. PERRL. EOMI. Head: Atraumatic. Nose: No congestion/rhinnorhea. Mouth/Throat: Mucous membranes are  moist.  Oropharynx non-erythematous.No foreign body no bleeding no swelling no pathology noted as a normal voice Neck: No stridor.   Nontender with no meningismus Cardiovascular: Normal rate, regular rhythm. Grossly normal heart sounds.  Good peripheral circulation. Respiratory: Normal respiratory effort.  No retractions. Lungs CTAB. Abdominal: Soft and nontender. No distention. No guarding no rebound Skin:  Skin is warm, dry and intact. No rash noted. Psychiatric: Mood and affect are normal. Speech and behavior are normal.  ____________________________________________   LABS (all labs ordered are listed, but only abnormal results are displayed)  Labs Reviewed - No data to display ____________________________________________  EKG  I personally interpreted any EKGs ordered by me or triage  ____________________________________________  RADIOLOGY  I reviewed any  imaging ordered by me or triage that were performed during my shift and, if possible, patient and/or family made aware of any abnormal findings. ____________________________________________   PROCEDURES  Procedure(s) performed: None  Procedures  Critical Care performed: None  ____________________________________________   INITIAL IMPRESSION / ASSESSMENT AND PLAN / ED COURSE  Pertinent labs & imaging results that were available during my care of the patient were reviewed by me and considered in my medical decision making (see chart for details).  Patient with a sensation of foreign body in his throat after eating a large piece of bacon that "scratched him" as it went down. This certainly could just be irritation from eating the item. There is no evidence of airway issue or esophageal blockage. Patient will be given a GI cocktail see if we can diminished his sensation of foreign body and we will have him follow up as a close outpatient with GI. No indication for emergent middle the night scoping for this patient no  evidence of Boerhaave's syndrome no evidence of esophageal obstruction. Nothing at this time suggests that this represents ACS PE or dissection and x-ray is reassuring. There is no evidence of angioedema stridor or laryngeal pathology.   ----------------------------------------- 4:34 AM on 10/23/2015 ----------------------------------------- patient felt 100% better after GI cocktail. No evidence of any significant acute pathology today but he will need to follow-up with GI. He is somewhat upset about this but unfortunately it is not the standard of care to have emergent endoscopy for a scratchy feeling in your throat with no ongoing symptoms of any kind of obstruction or airway issue or other decompensation. Extensive return precautions given and understood. We will discuss with Dr. Mechele Collin  ----------------------------------------- 6:10 AM on 10/23/2015 -----------------------------------------  Discussed with Dr. Mechele Collin who will see patient in follow-up.    Clinical Course   ____________________________________________   FINAL CLINICAL IMPRESSION(S) / ED DIAGNOSES  Final diagnoses:  None      This chart was dictated using voice recognition software.  Despite best efforts to proofread,  errors can occur which can change meaning.      Jeanmarie Plant, MD 10/23/15 0413    Jeanmarie Plant, MD 10/23/15 3244    Jeanmarie Plant, MD 10/23/15 [phone]

## 2015-10-24 ENCOUNTER — Encounter: Payer: Self-pay | Admitting: *Deleted

## 2015-10-24 ENCOUNTER — Ambulatory Visit: Payer: Medicaid Other | Admitting: Anesthesiology

## 2015-10-24 ENCOUNTER — Ambulatory Visit
Admission: RE | Admit: 2015-10-24 | Discharge: 2015-10-24 | Disposition: A | Discharge status: Home / Self Care | Payer: Medicaid Other | Source: Ambulatory Visit | Attending: Unknown Physician Specialty | Admitting: Unknown Physician Specialty

## 2015-10-24 ENCOUNTER — Encounter
Admission: RE | Disposition: A | Discharge status: Home / Self Care | Payer: Self-pay | Source: Ambulatory Visit | Attending: Unknown Physician Specialty

## 2015-10-24 DIAGNOSIS — J449 Chronic obstructive pulmonary disease, unspecified: Secondary | ICD-10-CM | POA: Insufficient documentation

## 2015-10-24 DIAGNOSIS — R1310 Dysphagia, unspecified: Secondary | ICD-10-CM | POA: Insufficient documentation

## 2015-10-24 DIAGNOSIS — Z7951 Long term (current) use of inhaled steroids: Secondary | ICD-10-CM | POA: Insufficient documentation

## 2015-10-24 DIAGNOSIS — K210 Gastro-esophageal reflux disease with esophagitis: Secondary | ICD-10-CM | POA: Insufficient documentation

## 2015-10-24 DIAGNOSIS — Z79899 Other long term (current) drug therapy: Secondary | ICD-10-CM | POA: Insufficient documentation

## 2015-10-24 DIAGNOSIS — K6389 Other specified diseases of intestine: Secondary | ICD-10-CM | POA: Insufficient documentation

## 2015-10-24 DIAGNOSIS — I10 Essential (primary) hypertension: Secondary | ICD-10-CM | POA: Insufficient documentation

## 2015-10-24 DIAGNOSIS — R0789 Other chest pain: Secondary | ICD-10-CM | POA: Diagnosis not present

## 2015-10-24 DIAGNOSIS — F17210 Nicotine dependence, cigarettes, uncomplicated: Secondary | ICD-10-CM | POA: Insufficient documentation

## 2015-10-24 DIAGNOSIS — Z791 Long term (current) use of non-steroidal anti-inflammatories (NSAID): Secondary | ICD-10-CM | POA: Diagnosis not present

## 2015-10-24 DIAGNOSIS — K449 Diaphragmatic hernia without obstruction or gangrene: Secondary | ICD-10-CM | POA: Diagnosis not present

## 2015-10-24 DIAGNOSIS — K222 Esophageal obstruction: Secondary | ICD-10-CM | POA: Insufficient documentation

## 2015-10-24 DIAGNOSIS — K317 Polyp of stomach and duodenum: Secondary | ICD-10-CM | POA: Diagnosis not present

## 2015-10-24 HISTORY — PX: ESOPHAGOGASTRODUODENOSCOPY (EGD) WITH PROPOFOL: SHX5813

## 2015-10-24 SURGERY — ESOPHAGOGASTRODUODENOSCOPY (EGD) WITH PROPOFOL
Anesthesia: General

## 2015-10-24 MED ORDER — PROPOFOL 500 MG/50ML IV EMUL
INTRAVENOUS | Status: DC | PRN
  Administered 2015-10-24: 100 ug/kg/min via INTRAVENOUS

## 2015-10-24 MED ORDER — SODIUM CHLORIDE 0.9 % IV SOLN
INTRAVENOUS | Status: DC
  Administered 2015-10-24: 1000 mL via INTRAVENOUS

## 2015-10-24 MED ORDER — PROPOFOL 10 MG/ML IV BOLUS
INTRAVENOUS | Status: DC | PRN
  Administered 2015-10-24: 120 mg via INTRAVENOUS

## 2015-10-24 MED ORDER — SODIUM CHLORIDE 0.9 % IV SOLN: INTRAVENOUS | Status: DC

## 2015-10-24 MED ORDER — FENTANYL CITRATE (PF) 100 MCG/2ML IJ SOLN
INTRAMUSCULAR | Status: DC | PRN
  Administered 2015-10-24: 50 ug via INTRAVENOUS

## 2015-10-24 NOTE — Transfer of Care (Signed)
Immediate Anesthesia Transfer of Care Note  Patient: Ryan [name]  Procedure(s) Performed: Procedure(s): ESOPHAGOGASTRODUODENOSCOPY (EGD) WITH PROPOFOL (N/A)  Patient Location: PACU and Endoscopy Unit  Anesthesia Type:General  Level of Consciousness: awake, alert  and oriented  Airway & Oxygen Therapy: Patient Spontanous Breathing and Patient connected to nasal cannula oxygen  Post-op Assessment: Report given to RN and Post -op Vital signs reviewed and stable  Post vital signs: Reviewed and stable  Last Vitals:  Vitals:   10/24/15 1302 10/24/15 1432  BP: 128/89 111/84  Pulse: 93 88  Resp: 18 14  Temp: 36.6 C 36.4 C    Last Pain:  Vitals:   10/24/15 1432  TempSrc: Axillary         Complications: No apparent anesthesia complications

## 2015-10-24 NOTE — Anesthesia Preprocedure Evaluation (Addendum)
Anesthesia Evaluation  Patient identified by MRN, date of birth, ID band Patient awake    Reviewed: Allergy & Precautions, NPO status , Patient's Chart, lab work & pertinent test results, reviewed documented beta blocker date and time   Airway Mallampati: II  TM Distance: >3 FB     Dental  (+) Chipped, Upper Dentures, Poor Dentition, Missing   Pulmonary asthma , COPD, Current Smoker,           Cardiovascular hypertension, Pt. on medications      Neuro/Psych  Neuromuscular disease    GI/Hepatic GERD  ,  Endo/Other    Renal/GU Renal InsufficiencyRenal disease     Musculoskeletal  (+) Arthritis ,   Abdominal   Peds  Hematology   Anesthesia Other Findings   Reproductive/Obstetrics                           Anesthesia Physical Anesthesia Plan  ASA: III  Anesthesia Plan: General   Post-op Pain Management:    Induction: Intravenous  Airway Management Planned: Nasal Cannula  Additional Equipment:   Intra-op Plan:   Post-operative Plan:   Informed Consent: I have reviewed the patients History and Physical, chart, labs and discussed the procedure including the risks, benefits and alternatives for the proposed anesthesia with the patient or authorized representative who has indicated his/her understanding and acceptance.     Plan Discussed with: CRNA  Anesthesia Plan Comments:         Anesthesia Quick Evaluation

## 2015-10-24 NOTE — H&P (Signed)
Primary Care Physician:  [facility] Primary Gastroenterologist:  Dr. Mechele CollinElliott  Pre-Procedure History & Physical: HPI:  Ryan [name] is a 60 y.o. male is here for an endoscopy.   Past Medical History:  Diagnosis Date  . Arthritis    left hand  . Asthma   . Chronic kidney disease    HAS HAD KIDNEY STONE 2015-- JUST PASSED  . COPD (chronic obstructive pulmonary disease) (HCC)   . GERD (gastroesophageal reflux disease)    TAKES TUMS FOR RELIEF  . Hyperlipidemia   . Hypertension   . Wears dentures    hass full upper plate, not wearing, broken  . Wears hearing aid     Past Surgical History:  Procedure Laterality Date  . BACK SURGERY    . EYE SURGERY    . HERNIA REPAIR     ERRONEOUS UMBILICAL HERNIA   2011  . MYRINGOTOMY WITH TUBE PLACEMENT Bilateral 03/28/2015   Procedure: MYRINGOTOMY WITH TUBE PLACEMENT;  Surgeon: Linus Salmonshapman McQueen, MD;  [facility];  Service: ENT;  Laterality: Bilateral;  BUTTERFLY TUBE  . TRANSFORAMINAL LUMBAR INTERBODY FUSION (TLIF) WITH PEDICLE SCREW FIXATION 1 LEVEL Right 06/18/2014   Procedure: TRANSFORAMINAL LUMBAR INTERBODY FUSION (TLIF) WITH PEDICLE SCREW FIXATION 1 LEVEL LUMBAR 5 -SACRAL 1;  Surgeon: Aliene Beamsandy Kritzer, MD;  Location: [facility];  Service: Neurosurgery;  Laterality: Right;  Right transforaminal lumbar interbody fusion with interbody prosthesis and percutaneous pedicle screws Lumbar 5 to Sacral 1    Prior to Admission medications   Medication Sig Start Date End Date Taking? Authorizing Provider  albuterol (PROVENTIL HFA;VENTOLIN HFA) 108 (90 BASE) MCG/ACT inhaler Inhale 1-2 puffs into the lungs every 6 (six) hours as needed for wheezing or shortness of breath.    Yes Historical Provider, MD  ALPRAZolam Prudy Feeler(XANAX) 1 MG tablet Take 1 mg by mouth at bedtime as needed for anxiety.   Yes Historical Provider, MD  cetirizine (ZYRTEC) 10 MG tablet Take 10 mg by mouth daily.   Yes Historical Provider, MD  cyclobenzaprine  (FLEXERIL) 10 MG tablet Take 10 mg by mouth at bedtime as needed for muscle spasms.   Yes Historical Provider, MD  fluticasone (FLONASE) 50 MCG/ACT nasal spray Place into both nostrils daily.   Yes Historical Provider, MD  gabapentin (NEURONTIN) 300 MG capsule Take 600 mg by mouth 3 (three) times daily.   Yes Historical Provider, MD  HYDROcodone-acetaminophen (NORCO/VICODIN) 5-325 MG tablet Take 1 tablet by mouth every 4 (four) hours as needed for moderate pain. 07/09/15  Yes Cari B Triplett, FNP  meloxicam (MOBIC) 15 MG tablet Take 1 tablet (15 mg total) by mouth daily. 07/09/15  Yes Cari B Triplett, FNP  tamsulosin (FLOMAX) 0.4 MG CAPS capsule Take 0.8 mg by mouth daily.  02/07/14  Yes Historical Provider, MD  acetaminophen (TYLENOL) 325 MG tablet Take 650 mg by mouth every 6 (six) hours as needed for mild pain or headache.     Historical Provider, MD  lisinopril (PRINIVIL,ZESTRIL) 20 MG tablet Take 20 mg by mouth daily. Reported on 03/26/2015    Historical Provider, MD    Allergies as of 10/23/2015  . (No Known Allergies)    Family History  Problem Relation Age of Onset  . Arthritis Mother   . Hypertension Mother   . Diabetes Father   . Hypertension Father     Social History   Social History  . Marital status: Married    Spouse name: N/A  . Number of children:  N/A  . Years of education: N/A   Occupational History  . Not on file.   Social History Main Topics  . Smoking status: Current Every Day Smoker    Packs/day: 1.50    Years: 40.00    Types: Cigarettes  . Smokeless tobacco: Never Used     Comment: has cut back to 1PPD  . Alcohol use No     Comment: ONE OR TWICE A YR  . Drug use: No  . Sexual activity: Not on file   Other Topics Concern  . Not on file   Social History Narrative  . No narrative on file    Review of Systems: See HPI, otherwise negative ROS  Physical Exam: BP 128/89   Pulse 93   Temp 97.9 F (36.6 C) (Tympanic)   Resp 18   Ht 5\' 7"  (1.702 m)    Wt 77.3 kg (170 lb 6 oz)   SpO2 98%   BMI 26.68 kg/m  General:   Alert,  pleasant and cooperative in NAD Head:  Normocephalic and atraumatic. Neck:  Supple; no masses or thyromegaly. Lungs:  Clear throughout to auscultation.    Heart:  Regular rate and rhythm. Abdomen:  Soft, nontender and nondistended. Normal bowel sounds, without guarding, and without rebound.   Neurologic:  Alert and  oriented x4;  grossly normal neurologically.  Impression/Plan: Ryan [name] is here for an endoscopy to be performed for dysphagia  Risks, benefits, limitations, and alternatives regarding  endoscopy have been reviewed with the patient.  Questions have been answered.  All parties agreeable.   Lynnae Prude, MD  10/24/2015, 2:10 PM

## 2015-10-24 NOTE — Op Note (Signed)
[facility] Gastroenterology Patient Name: Ryan [name] Procedure Date: 10/24/2015 2:11 PM MRN: [id number] Account #: [id number] Date of Birth: [birth year] Admit Type: Outpatient Age: 60 Room: [facility] ENDO ROOM 1 Gender: Male Note Status: Finalized Procedure:            Upper GI endoscopy Indications:          Dysphagia, Chest pain (non cardiac) Providers:            Scot Jun, MD Referring MD:         No Local Md, MD (Referring MD) Medicines:            Propofol per Anesthesia Complications:        No immediate complications. Procedure:            Pre-Anesthesia Assessment:                       - After reviewing the risks and benefits, the patient                        was deemed in satisfactory condition to undergo the                        procedure.                       After obtaining informed consent, the endoscope was                        passed under direct vision. Throughout the procedure,                        the patient's blood pressure, pulse, and oxygen                        saturations were monitored continuously. The Endoscope                        was introduced through the mouth, and advanced to the                        second part of duodenum. The upper GI endoscopy was                        accomplished without difficulty. The patient tolerated                        the procedure well. Findings:      A non-obstructing Schatzki ring (acquired) was found at the       gastroesophageal junction.      A single Uher sessile fundic gland polyp with no bleeding and no       stigmata of recent bleeding was found at the gastroesophageal junction       and in the cardia. Biopsies were taken with a cold forceps for histology.      A Cassatt hiatal hernia was present.      Stomach showed flecks of dark material, possible old blood.      The examined duodenum was normal. Impression:           - Non-obstructing Schatzki ring.         - A single fundic  gland polyp. Biopsied.                       - Dawe hiatal hernia.                       - Normal examined duodenum. Recommendation:       - Await pathology results. Take medicine for heartburn,                        eat slowly chew well take Colvin bites. Scot Junobert T Elliott, MD 10/24/2015 2:28:47 PM This report has been signed electronically. Number of Addenda: 0 Note Initiated On: 10/24/2015 2:11 PM      [facility]

## 2015-10-24 NOTE — Anesthesia Postprocedure Evaluation (Signed)
Anesthesia Post Note  Patient: Marlane Mingleony C Rech  Procedure(s) Performed: Procedure(s) (LRB): ESOPHAGOGASTRODUODENOSCOPY (EGD) WITH PROPOFOL (N/A)  Patient location during evaluation: Endoscopy Anesthesia Type: General Level of consciousness: awake and alert Pain management: pain level controlled Vital Signs Assessment: post-procedure vital signs reviewed and stable Respiratory status: spontaneous breathing, nonlabored ventilation, respiratory function stable and patient connected to nasal cannula oxygen Cardiovascular status: blood pressure returned to baseline and stable Postop Assessment: no signs of nausea or vomiting Anesthetic complications: no    Last Vitals:  Vitals:   10/24/15 1432 10/24/15 1442  BP: 111/84 121/83  Pulse: 88 75  Resp: 14 (!) 22  Temp: 36.4 C     Last Pain:  Vitals:   10/24/15 1432  TempSrc: Axillary                 Jonnae Fonseca S

## 2015-10-27 ENCOUNTER — Encounter: Payer: Self-pay | Admitting: Unknown Physician Specialty

## 2015-10-28 LAB — SURGICAL PATHOLOGY

## 2015-11-10 DIAGNOSIS — R0602 Shortness of breath: Secondary | ICD-10-CM | POA: Insufficient documentation

## 2015-11-11 NOTE — Discharge Instructions (Signed)

## 2015-11-12 ENCOUNTER — Encounter: Payer: Self-pay | Admitting: Anesthesiology

## 2015-11-12 ENCOUNTER — Encounter: Payer: Self-pay | Admitting: *Deleted

## 2015-11-18 ENCOUNTER — Ambulatory Visit: Admission: RE | Admit: 2015-11-18 | Payer: Medicaid Other | Source: Ambulatory Visit | Admitting: Ophthalmology

## 2015-11-18 SURGERY — PHACOEMULSIFICATION, CATARACT, WITH IOL INSERTION
Anesthesia: Regional | Laterality: Left

## 2016-01-01 ENCOUNTER — Encounter: Payer: Self-pay | Admitting: *Deleted

## 2016-01-02 NOTE — Discharge Instructions (Signed)
Cataract Surgery, Care After °Refer to this sheet in the next few weeks. These instructions provide you with information about caring for yourself after your procedure. Your health care provider may also give you more specific instructions. Your treatment has been planned according to current medical practices, but problems sometimes occur. Call your health care provider if you have any problems or questions after your procedure. °What can I expect after the procedure? °After the procedure, it is common to have: °· Itching. °· Discomfort. °· Fluid discharge. °· Sensitivity to light and to touch. °· Bruising. °Follow these instructions at home: °Eye Care  °· Check your eye every day for signs of infection. Watch for: °¨ Redness, swelling, or pain. °¨ Fluid, blood, or pus. °¨ Warmth. °¨ Bad smell. °Activity  °· Avoid strenuous activities, such as playing contact sports, for as long as told by your health care provider. °· Do not drive or operate heavy machinery until your health care provider approves. °· Do not bend or lift heavy objects . Bending increases pressure in the eye. You can walk, climb stairs, and do light household chores. °· Ask your health care provider when you can return to work. If you work in a dusty environment, you may be advised to wear protective eyewear for a period of time. °General instructions  °· Take or apply over-the-counter and prescription medicines only as told by your health care provider. This includes eye drops. °· Do not touch or rub your eyes. °· If you were given a protective shield, wear it as told by your health care provider. If you were not given a protective shield, wear sunglasses as told by your health care provider to protect your eyes. °· Keep the area around your eye clean and dry. Avoid swimming or allowing water to hit you directly in the face while showering until told by your health care provider. Keep soap and shampoo out of your eyes. °· Do not put a contact lens  into the affected eye or eyes until your health care provider approves. °· Keep all follow-up visits as told by your health care provider. This is important. °Contact a health care provider if: ° °· You have increased bruising around your eye. °· You have pain that is not helped with medicine. °· You have a fever. °· You have redness, swelling, or pain in your eye. °· You have fluid, blood, or pus coming from your incision. °· Your vision gets worse. °Get help right away if: °· You have sudden vision loss. °This information is not intended to replace advice given to you by your health care provider. Make sure you discuss any questions you have with your health care provider. °Document Released: 07/31/2004 Document Revised: 05/22/2015 Document Reviewed: 11/21/2014 °Elsevier Interactive Patient Education © 2017 [employer] ° ° ° ° °General Anesthesia, Adult, Care After °These instructions provide you with information about caring for yourself after your procedure. Your health care provider may also give you more specific instructions. Your treatment has been planned according to current medical practices, but problems sometimes occur. Call your health care provider if you have any problems or questions after your procedure. °What can I expect after the procedure? °After the procedure, it is common to have: °· Vomiting. °· A sore throat. °· Mental slowness. °It is common to feel: °· Nauseous. °· Cold or shivery. °· Sleepy. °· Tired. °· Sore or achy, even in parts of your body where you did not have surgery. °Follow these instructions at   home: °For at least 24 hours after the procedure:  °· Do not: °¨ Participate in activities where you could fall or become injured. °¨ Drive. °¨ Use heavy machinery. °¨ Drink alcohol. °¨ Take sleeping pills or medicines that cause drowsiness. °¨ Make important decisions or sign legal documents. °¨ Take care of children on your own. °· Rest. °Eating and drinking  °· If you vomit, drink  water, juice, or soup when you can drink without vomiting. °· Drink enough fluid to keep your urine clear or pale yellow. °· Make sure you have little or no nausea before eating solid foods. °· Follow the diet recommended by your health care provider. °General instructions  °· Have a responsible adult stay with you until you are awake and alert. °· Return to your normal activities as told by your health care provider. Ask your health care provider what activities are safe for you. °· Take over-the-counter and prescription medicines only as told by your health care provider. °· If you smoke, do not smoke without supervision. °· Keep all follow-up visits as told by your health care provider. This is important. °Contact a health care provider if: °· You continue to have nausea or vomiting at home, and medicines are not helpful. °· You cannot drink fluids or start eating again. °· You cannot urinate after 8-12 hours. °· You develop a skin rash. °· You have fever. °· You have increasing redness at the site of your procedure. °Get help right away if: °· You have difficulty breathing. °· You have chest pain. °· You have unexpected bleeding. °· You feel that you are having a life-threatening or urgent problem. °This information is not intended to replace advice given to you by your health care provider. Make sure you discuss any questions you have with your health care provider. °Document Released: 04/19/2000 Document Revised: 06/16/2015 Document Reviewed: 12/26/2014 °Elsevier Interactive Patient Education © 2017 Elsevier Inc. ° °

## 2016-01-06 ENCOUNTER — Encounter
Admission: RE | Disposition: A | Discharge status: Home / Self Care | Payer: Self-pay | Source: Ambulatory Visit | Attending: Ophthalmology

## 2016-01-06 ENCOUNTER — Ambulatory Visit: Payer: Medicaid Other | Admitting: Anesthesiology

## 2016-01-06 ENCOUNTER — Ambulatory Visit
Admission: RE | Admit: 2016-01-06 | Discharge: 2016-01-06 | Disposition: A | Discharge status: Home / Self Care | Payer: Medicaid Other | Source: Ambulatory Visit | Attending: Ophthalmology | Admitting: Ophthalmology

## 2016-01-06 DIAGNOSIS — Z87442 Personal history of urinary calculi: Secondary | ICD-10-CM | POA: Insufficient documentation

## 2016-01-06 DIAGNOSIS — H2512 Age-related nuclear cataract, left eye: Secondary | ICD-10-CM | POA: Insufficient documentation

## 2016-01-06 DIAGNOSIS — F419 Anxiety disorder, unspecified: Secondary | ICD-10-CM | POA: Insufficient documentation

## 2016-01-06 DIAGNOSIS — H268 Other specified cataract: Secondary | ICD-10-CM | POA: Diagnosis not present

## 2016-01-06 DIAGNOSIS — I10 Essential (primary) hypertension: Secondary | ICD-10-CM | POA: Diagnosis not present

## 2016-01-06 DIAGNOSIS — K219 Gastro-esophageal reflux disease without esophagitis: Secondary | ICD-10-CM | POA: Insufficient documentation

## 2016-01-06 DIAGNOSIS — F329 Major depressive disorder, single episode, unspecified: Secondary | ICD-10-CM | POA: Diagnosis not present

## 2016-01-06 DIAGNOSIS — J449 Chronic obstructive pulmonary disease, unspecified: Secondary | ICD-10-CM | POA: Diagnosis not present

## 2016-01-06 DIAGNOSIS — N400 Enlarged prostate without lower urinary tract symptoms: Secondary | ICD-10-CM | POA: Insufficient documentation

## 2016-01-06 DIAGNOSIS — F17210 Nicotine dependence, cigarettes, uncomplicated: Secondary | ICD-10-CM | POA: Diagnosis not present

## 2016-01-06 HISTORY — PX: CATARACT EXTRACTION W/PHACO: SHX586

## 2016-01-06 SURGERY — PHACOEMULSIFICATION, CATARACT, WITH IOL INSERTION
Anesthesia: Monitor Anesthesia Care | Site: Eye | Laterality: Left | Wound class: Clean

## 2016-01-06 MED ORDER — TRYPAN BLUE 0.06 % OP SOLN
OPHTHALMIC | Status: DC | PRN
  Administered 2016-01-06: .3 mL via INTRAOCULAR

## 2016-01-06 MED ORDER — FENTANYL CITRATE (PF) 100 MCG/2ML IJ SOLN
INTRAMUSCULAR | Status: DC | PRN
  Administered 2016-01-06 (×2): 50 ug via INTRAVENOUS

## 2016-01-06 MED ORDER — BSS IO SOLN
INTRAOCULAR | Status: DC | PRN
  Administered 2016-01-06: 15 mL via INTRAOCULAR

## 2016-01-06 MED ORDER — EPINEPHRINE PF 1 MG/ML IJ SOLN
INTRAOCULAR | Status: DC | PRN
  Administered 2016-01-06: 107 mL via OPHTHALMIC

## 2016-01-06 MED ORDER — SODIUM HYALURONATE 10 MG/ML IO SOLN
INTRAOCULAR | Status: DC | PRN
Start: 2016-01-06 — End: 2016-01-06
  Administered 2016-01-06: 0.85 mL via INTRAOCULAR

## 2016-01-06 MED ORDER — LIDOCAINE HCL (PF) 4 % IJ SOLN
INTRAOCULAR | Status: DC | PRN
  Administered 2016-01-06: 1 mL via OPHTHALMIC

## 2016-01-06 MED ORDER — MOXIFLOXACIN HCL 0.5 % OP SOLN
OPHTHALMIC | Status: DC | PRN
  Administered 2016-01-06: 2 [drp] via OPHTHALMIC

## 2016-01-06 MED ORDER — ACETAMINOPHEN 325 MG PO TABS: 325.0000 mg | ORAL_TABLET | ORAL | Status: DC | PRN

## 2016-01-06 MED ORDER — ARMC OPHTHALMIC DILATING DROPS
1.0000 "application " | OPHTHALMIC | Status: DC | PRN
  Administered 2016-01-06 (×3): 1 via OPHTHALMIC

## 2016-01-06 MED ORDER — TRIAMCINOLONE ACETONIDE 40 MG/ML IJ SUSP
INTRAMUSCULAR | Status: DC | PRN
  Administered 2016-01-06: 4 mg

## 2016-01-06 MED ORDER — ACETAMINOPHEN 160 MG/5ML PO SOLN: 325.0000 mg | ORAL | Status: DC | PRN

## 2016-01-06 MED ORDER — LACTATED RINGERS IV SOLN
INTRAVENOUS | Status: DC
Start: 2016-01-06 — End: 2016-01-06

## 2016-01-06 MED ORDER — MIDAZOLAM HCL 2 MG/2ML IJ SOLN
INTRAMUSCULAR | Status: DC | PRN
  Administered 2016-01-06: 2 mg via INTRAVENOUS

## 2016-01-06 MED ORDER — SODIUM HYALURONATE 23 MG/ML IO SOLN
INTRAOCULAR | Status: DC | PRN
Start: 2016-01-06 — End: 2016-01-06
  Administered 2016-01-06: 0.6 mL via INTRAOCULAR

## 2016-01-06 SURGICAL SUPPLY — 19 items
CANNULA ANT/CHMB 27GA (MISCELLANEOUS) ×3 IMPLANT
CUP MEDICINE 2OZ PLAST GRAD ST (MISCELLANEOUS) ×3 IMPLANT
DISSECTOR HYDRO NUCLEUS 50X22 (MISCELLANEOUS) ×3 IMPLANT
GLOVE BIO SURGEON STRL SZ8 (GLOVE) ×3 IMPLANT
GLOVE SURG LX 7.5 STRW (GLOVE) ×2
GLOVE SURG LX STRL 7.5 STRW (GLOVE) ×1 IMPLANT
GOWN STRL REUS W/ TWL LRG LVL3 (GOWN DISPOSABLE) ×2 IMPLANT
GOWN STRL REUS W/TWL LRG LVL3 (GOWN DISPOSABLE) ×4
LENS IOL TECNIS ITEC 20.5 (Intraocular Lens) ×3 IMPLANT
MARKER SKIN DUAL TIP RULER LAB (MISCELLANEOUS) ×3 IMPLANT
PACK CATARACT (MISCELLANEOUS) ×3 IMPLANT
PACK CATARACT BRASINGTON (MISCELLANEOUS) ×3 IMPLANT
PACK EYE AFTER SURG (MISCELLANEOUS) ×3 IMPLANT
SOL PREP PVP 2OZ (MISCELLANEOUS) ×3
SOLUTION PREP PVP 2OZ (MISCELLANEOUS) ×1 IMPLANT
SYR 3ML LL SCALE MARK (SYRINGE) ×3 IMPLANT
SYR TB 1ML LUER SLIP (SYRINGE) ×3 IMPLANT
WATER STERILE IRR 250ML POUR (IV SOLUTION) ×3 IMPLANT
WIPE NON LINTING 3.25X3.25 (MISCELLANEOUS) ×3 IMPLANT

## 2016-01-06 NOTE — Op Note (Signed)
OPERATIVE NOTE  Marlane Mingleony C Viall [id number] 01/06/2016   PREOPERATIVE DIAGNOSIS:  Nuclear sclerotic cataract left eye.  H25.12. Traumatic cataract, white mature cataract.   POSTOPERATIVE DIAGNOSIS:    Nuclear sclerotic cataract left eye.  Traumatic cataract, white mature cataract.     PROCEDURE:  Complex Phacoemusification with posterior chamber intraocular lens placement of the left eye.  Use of trypan blue for visualization of the anterior capsule.  LENS:   Implant Name Type Inv. Item Serial No. Manufacturer Lot No. LRB No. Used  LENS IOL DIOP 20.5 - [id number] Intraocular Lens LENS IOL DIOP 20.5 [id number]   Left 1       PCB00 +20.5   ULTRASOUND TIME: 1 minutes 00 seconds.  CDE 13.79   SURGEON:  Willey BladeBradley King, MD, MPH   ANESTHESIA:  Topical with tetracaine drops augmented with 1% preservative-free intracameral lidocaine.   COMPLICATIONS:  None.   DESCRIPTION OF PROCEDURE:  The patient was identified in the holding room and transported to the operating room and placed in the supine position under the operating microscope.  The left eye was identified as the operative eye and it was prepped and draped in the usual sterile ophthalmic fashion.   A 1.0 millimeter clear-corneal paracentesis was made at the 5:00 position. 0.5 ml of preservative-free 1% lidocaine with epinephrine was injected into the anterior chamber.  There was a poor red reflex, so Trypan blue was instilled under an air bubble and rinsed out with BSS to stain the capsule for improved visualization.   The anterior chamber was filled with Healon 5 viscoelastic.  A 2.4 millimeter keratome was used to make a near-clear corneal incision at the 2:00 position.  A curvilinear capsulorrhexis was made with a cystotome and capsulorrhexis forceps.  Balanced salt solution was used to hydrodissect and hydrodelineate the nucleus.   Phacoemulsification was then used in stop and chop fashion to remove the lens nucleus and  epinucleus.    The cortical strands were behaving slightly irregularly, and the patient history of trauma and the patient was complaining of pain, so some triescence was injected into the anterior chamber and rinsed out, with no vitreous identified.  The remaining cortex was then removed carefully and slowly using the irrigation and aspiration handpiece. Healon was then placed into the capsular bag to distend it for lens placement.  A lens was then injected into the capsular bag.  The remaining viscoelastic was aspirated.   Wounds were hydrated with balanced salt solution.  The anterior chamber was inflated to a physiologic pressure with balanced salt solution.   Intracameral vigamox 0.1 mL undiltued was injected into the eye and a drop placed onto the ocular surface. Also a tiny amount of triescence was injected into the anterior chamber.   No wound leaks were noted.  The patient was taken to the recovery room in stable condition without complications of anesthesia or surgery  Willey BladeBradley King 01/06/2016, 9:35 AM

## 2016-01-06 NOTE — Anesthesia Preprocedure Evaluation (Signed)
Anesthesia Evaluation  Patient identified by MRN, date of birth, ID band Patient awake    Reviewed: Allergy & Precautions, H&P , NPO status , Patient's Chart, lab work & pertinent test results  Airway Mallampati: II  TM Distance: >3 FB Neck ROM: full    Dental  (+) Edentulous Upper, Edentulous Lower   Pulmonary COPD, Current Smoker,    + rhonchi        Cardiovascular hypertension, Normal cardiovascular exam     Neuro/Psych    GI/Hepatic GERD  ,  Endo/Other    Renal/GU Renal disease     Musculoskeletal   Abdominal   Peds  Hematology   Anesthesia Other Findings   Reproductive/Obstetrics                             Anesthesia Physical Anesthesia Plan  ASA: III  Anesthesia Plan: MAC   Post-op Pain Management:    Induction:   Airway Management Planned:   Additional Equipment:   Intra-op Plan:   Post-operative Plan:   Informed Consent: I have reviewed the patients History and Physical, chart, labs and discussed the procedure including the risks, benefits and alternatives for the proposed anesthesia with the patient or authorized representative who has indicated his/her understanding and acceptance.     Plan Discussed with:   Anesthesia Plan Comments:         Anesthesia Quick Evaluation

## 2016-01-06 NOTE — Anesthesia Postprocedure Evaluation (Signed)
Anesthesia Post Note  Patient: Ryan [name]  Procedure(s) Performed: Procedure(s) (LRB): CATARACT EXTRACTION PHACO AND INTRAOCULAR LENS PLACEMENT (IOC) (Left)  Patient location during evaluation: PACU Anesthesia Type: MAC Level of consciousness: awake and alert and oriented Pain management: satisfactory to patient Vital Signs Assessment: post-procedure vital signs reviewed and stable Respiratory status: spontaneous breathing, nonlabored ventilation and respiratory function stable Cardiovascular status: blood pressure returned to baseline and stable Postop Assessment: Adequate PO intake and No signs of nausea or vomiting Anesthetic complications: no    Cherly BeachStella, Demita Tobia J

## 2016-01-06 NOTE — Anesthesia Procedure Notes (Signed)
Procedure Name: MAC Performed by: Shaunae Sieloff Pre-anesthesia Checklist: Patient identified, Emergency Drugs available, Suction available, Timeout performed and Patient being monitored Patient Re-evaluated:Patient Re-evaluated prior to inductionOxygen Delivery Method: Nasal cannula Placement Confirmation: positive ETCO2     

## 2016-01-06 NOTE — H&P (Signed)
The History and Physical notes are on paper, have been signed, and are to be scanned. The patient remains stable and unchanged from the H&P.   Previous H&P reviewed, patient examined, and there are no changes.  Ryan BladeBradley Rylie [name] 01/06/2016 8:43 AM

## 2016-01-06 NOTE — Transfer of Care (Signed)
Immediate Anesthesia Transfer of Care Note  Patient: Ryan [name]  Procedure(s) Performed: Procedure(s) with comments: CATARACT EXTRACTION PHACO AND INTRAOCULAR LENS PLACEMENT (IOC) (Left) - LEFT  Patient Location: PACU  Anesthesia Type: MAC  Level of Consciousness: awake, alert  and patient cooperative  Airway and Oxygen Therapy: Patient Spontanous Breathing and Patient connected to supplemental oxygen  Post-op Assessment: Post-op Vital signs reviewed, Patient's Cardiovascular Status Stable, Respiratory Function Stable, Patent Airway and No signs of Nausea or vomiting  Post-op Vital Signs: Reviewed and stable  Complications: No apparent anesthesia complications

## 2016-01-07 ENCOUNTER — Encounter: Payer: Self-pay | Admitting: Ophthalmology

## 2016-04-16 ENCOUNTER — Encounter: Payer: Self-pay | Admitting: *Deleted

## 2016-04-19 ENCOUNTER — Encounter
Admission: RE | Disposition: A | Discharge status: Home / Self Care | Payer: Self-pay | Source: Ambulatory Visit | Attending: Unknown Physician Specialty

## 2016-04-19 ENCOUNTER — Ambulatory Visit: Payer: Medicaid Other | Admitting: Certified Registered"

## 2016-04-19 ENCOUNTER — Encounter: Payer: Self-pay | Admitting: *Deleted

## 2016-04-19 ENCOUNTER — Ambulatory Visit
Admission: RE | Admit: 2016-04-19 | Discharge: 2016-04-19 | Disposition: A | Discharge status: Home / Self Care | Payer: Medicaid Other | Source: Ambulatory Visit | Attending: Unknown Physician Specialty | Admitting: Unknown Physician Specialty

## 2016-04-19 DIAGNOSIS — K648 Other hemorrhoids: Secondary | ICD-10-CM | POA: Insufficient documentation

## 2016-04-19 DIAGNOSIS — N189 Chronic kidney disease, unspecified: Secondary | ICD-10-CM | POA: Insufficient documentation

## 2016-04-19 DIAGNOSIS — E785 Hyperlipidemia, unspecified: Secondary | ICD-10-CM | POA: Insufficient documentation

## 2016-04-19 DIAGNOSIS — Z79899 Other long term (current) drug therapy: Secondary | ICD-10-CM | POA: Diagnosis not present

## 2016-04-19 DIAGNOSIS — Z7951 Long term (current) use of inhaled steroids: Secondary | ICD-10-CM | POA: Insufficient documentation

## 2016-04-19 DIAGNOSIS — M19042 Primary osteoarthritis, left hand: Secondary | ICD-10-CM | POA: Diagnosis not present

## 2016-04-19 DIAGNOSIS — I129 Hypertensive chronic kidney disease with stage 1 through stage 4 chronic kidney disease, or unspecified chronic kidney disease: Secondary | ICD-10-CM | POA: Diagnosis not present

## 2016-04-19 DIAGNOSIS — K219 Gastro-esophageal reflux disease without esophagitis: Secondary | ICD-10-CM | POA: Diagnosis not present

## 2016-04-19 DIAGNOSIS — K621 Rectal polyp: Secondary | ICD-10-CM | POA: Diagnosis not present

## 2016-04-19 DIAGNOSIS — K635 Polyp of colon: Secondary | ICD-10-CM | POA: Diagnosis not present

## 2016-04-19 DIAGNOSIS — Z1211 Encounter for screening for malignant neoplasm of colon: Secondary | ICD-10-CM | POA: Diagnosis present

## 2016-04-19 DIAGNOSIS — J449 Chronic obstructive pulmonary disease, unspecified: Secondary | ICD-10-CM | POA: Insufficient documentation

## 2016-04-19 DIAGNOSIS — F17210 Nicotine dependence, cigarettes, uncomplicated: Secondary | ICD-10-CM | POA: Insufficient documentation

## 2016-04-19 DIAGNOSIS — Z833 Family history of diabetes mellitus: Secondary | ICD-10-CM | POA: Diagnosis not present

## 2016-04-19 DIAGNOSIS — Z8249 Family history of ischemic heart disease and other diseases of the circulatory system: Secondary | ICD-10-CM | POA: Diagnosis not present

## 2016-04-19 HISTORY — DX: Unspecified symptoms and signs involving the genitourinary system: R39.9

## 2016-04-19 HISTORY — DX: Dyspnea, unspecified: R06.00

## 2016-04-19 HISTORY — PX: COLONOSCOPY WITH PROPOFOL: SHX5780

## 2016-04-19 SURGERY — COLONOSCOPY WITH PROPOFOL
Anesthesia: General

## 2016-04-19 MED ORDER — SODIUM CHLORIDE 0.9 % IV SOLN
INTRAVENOUS | Status: DC
  Administered 2016-04-19: 14:00:00 via INTRAVENOUS

## 2016-04-19 MED ORDER — PROPOFOL 10 MG/ML IV BOLUS
INTRAVENOUS | Status: DC | PRN
  Administered 2016-04-19: 40 mg via INTRAVENOUS
  Administered 2016-04-19: 20 mg via INTRAVENOUS

## 2016-04-19 MED ORDER — PROPOFOL 500 MG/50ML IV EMUL
INTRAVENOUS | Status: DC | PRN
  Administered 2016-04-19: 120 ug/kg/min via INTRAVENOUS

## 2016-04-19 MED ORDER — SODIUM CHLORIDE 0.9 % IV SOLN: INTRAVENOUS | Status: DC

## 2016-04-19 MED ORDER — PROPOFOL 500 MG/50ML IV EMUL
INTRAVENOUS | Status: AC
  Filled 2016-04-19: qty 50

## 2016-04-19 MED ORDER — PHENYLEPHRINE HCL 10 MG/ML IJ SOLN
INTRAMUSCULAR | Status: DC | PRN
  Administered 2016-04-19: 50 ug via INTRAVENOUS

## 2016-04-19 NOTE — Anesthesia Post-op Follow-up Note (Cosign Needed)
Anesthesia QCDR form completed.        

## 2016-04-19 NOTE — Anesthesia Preprocedure Evaluation (Signed)
Anesthesia Evaluation  Patient identified by MRN, date of birth, ID band Patient awake    Reviewed: Allergy & Precautions, H&P , NPO status , Patient's Chart, lab work & pertinent test results, reviewed documented beta blocker date and time   History of Anesthesia Complications Negative for: history of anesthetic complications  Airway Mallampati: I  TM Distance: >3 FB Neck ROM: full    Dental  (+) Edentulous Upper, Edentulous Lower   Pulmonary shortness of breath and with exertion, asthma , neg sleep apnea, COPD,  COPD inhaler, neg recent URI, Current Smoker,           Cardiovascular Exercise Tolerance: Good hypertension, (-) angina(-) CAD, (-) Past MI, (-) Cardiac Stents and (-) CABG (-) dysrhythmias (-) Valvular Problems/Murmurs     Neuro/Psych negative neurological ROS  negative psych ROS   GI/Hepatic Neg liver ROS, GERD  ,  Endo/Other  negative endocrine ROS  Renal/GU Renal disease (kidney stones)  negative genitourinary   Musculoskeletal   Abdominal   Peds  Hematology negative hematology ROS (+)   Anesthesia Other Findings Past Medical History: No date: Arthritis     Comment: left hand No date: Asthma No date: Chronic kidney disease     Comment: HAS HAD KIDNEY STONE 2015-- JUST PASSED No date: COPD (chronic obstructive pulmonary disease) (* No date: Dyspnea No date: GERD (gastroesophageal reflux disease)     Comment: TAKES TUMS FOR RELIEF No date: Hyperlipidemia No date: Hypertension     Comment: Improved, No longer on meds No date: Lower urinary tract symptoms (LUTS) No date: Wears dentures     Comment: hass full upper plate, not wearing, broken No date: Wears hearing aid   Reproductive/Obstetrics negative OB ROS                             Anesthesia Physical Anesthesia Plan  ASA: III  Anesthesia Plan: General   Post-op Pain Management:    Induction:   Airway  Management Planned:   Additional Equipment:   Intra-op Plan:   Post-operative Plan:   Informed Consent: I have reviewed the patients History and Physical, chart, labs and discussed the procedure including the risks, benefits and alternatives for the proposed anesthesia with the patient or authorized representative who has indicated his/her understanding and acceptance.   Dental Advisory Given  Plan Discussed with: Anesthesiologist, CRNA and Surgeon  Anesthesia Plan Comments:         Anesthesia Quick Evaluation

## 2016-04-19 NOTE — H&P (Signed)
Primary Care Physician:  [facility] Primary Gastroenterologist:  Dr. Mechele Collin  Pre-Procedure History & Physical: HPI:  Ryan [name] is a 61 y.o. male is here for an colonoscopy.   Past Medical History:  Diagnosis Date  . Arthritis    left hand  . Asthma   . Chronic kidney disease    HAS HAD KIDNEY STONE 2015-- JUST PASSED  . COPD (chronic obstructive pulmonary disease) (HCC)   . Dyspnea   . GERD (gastroesophageal reflux disease)    TAKES TUMS FOR RELIEF  . Hyperlipidemia   . Hypertension    Improved, No longer on meds  . Lower urinary tract symptoms (LUTS)   . Wears dentures    hass full upper plate, not wearing, broken  . Wears hearing aid     Past Surgical History:  Procedure Laterality Date  . BACK SURGERY    . CATARACT EXTRACTION W/PHACO Left 01/06/2016   Procedure: CATARACT EXTRACTION PHACO AND INTRAOCULAR LENS PLACEMENT (IOC);  Surgeon: Nevada Crane, MD;  Location: [facility];  Service: Ophthalmology;  Laterality: Left;  LEFT  . ESOPHAGOGASTRODUODENOSCOPY (EGD) WITH PROPOFOL N/A 10/24/2015   Procedure: ESOPHAGOGASTRODUODENOSCOPY (EGD) WITH PROPOFOL;  Surgeon: Scot Jun, MD;  Location: [facility];  Service: Endoscopy;  Laterality: N/A;  . EYE SURGERY    . HERNIA REPAIR     ERRONEOUS UMBILICAL HERNIA   2011  . MYRINGOTOMY WITH TUBE PLACEMENT Bilateral 03/28/2015   Procedure: MYRINGOTOMY WITH TUBE PLACEMENT;  Surgeon: Linus Salmons, MD;  Location: [facility];  Service: ENT;  Laterality: Bilateral;  BUTTERFLY TUBE  . TONSILLECTOMY    . TRANSFORAMINAL LUMBAR INTERBODY FUSION (TLIF) WITH PEDICLE SCREW FIXATION 1 LEVEL Right 06/18/2014   Procedure: TRANSFORAMINAL LUMBAR INTERBODY FUSION (TLIF) WITH PEDICLE SCREW FIXATION 1 LEVEL LUMBAR 5 -SACRAL 1;  Surgeon: Aliene Beams, MD;  Location: [facility];  Service: Neurosurgery;  Laterality: Right;  Right transforaminal lumbar interbody fusion with interbody prosthesis and percutaneous  pedicle screws Lumbar 5 to Sacral 1    Prior to Admission medications   Medication Sig Start Date End Date Taking? Authorizing Provider  acetaminophen (TYLENOL) 325 MG tablet Take 650 mg by mouth every 6 (six) hours as needed for mild pain or headache.    Yes Historical Provider, MD  albuterol (PROVENTIL HFA;VENTOLIN HFA) 108 (90 BASE) MCG/ACT inhaler Inhale 1-2 puffs into the lungs every 6 (six) hours as needed for wheezing or shortness of breath.    Yes Historical Provider, MD  ALPRAZolam Prudy Feeler) 1 MG tablet Take 1 mg by mouth at bedtime as needed for anxiety.   Yes Historical Provider, MD  cetirizine (ZYRTEC) 10 MG tablet Take 10 mg by mouth daily.   Yes Historical Provider, MD  cyclobenzaprine (FLEXERIL) 10 MG tablet Take 10 mg by mouth at bedtime as needed for muscle spasms.   Yes Historical Provider, MD  fluticasone (FLONASE) 50 MCG/ACT nasal spray Place into both nostrils daily.   Yes Historical Provider, MD  gabapentin (NEURONTIN) 300 MG capsule Take 600 mg by mouth 3 (three) times daily.   Yes Historical Provider, MD  Multiple Vitamin (MULTIVITAMIN) tablet Take 1 tablet by mouth daily.   Yes Historical Provider, MD  omeprazole (PRILOSEC) 40 MG capsule Take 40 mg by mouth daily.   Yes Historical Provider, MD  tamsulosin (FLOMAX) 0.4 MG CAPS capsule Take 0.8 mg by mouth daily.  02/07/14  Yes Historical Provider, MD  lisinopril (PRINIVIL,ZESTRIL) 20 MG tablet Take 10 mg by mouth  daily.    Historical Provider, MD    Allergies as of 03/16/2016  . (No Known Allergies)    Family History  Problem Relation Age of Onset  . Arthritis Mother   . Hypertension Mother   . Diabetes Father   . Hypertension Father     Social History   Social History  . Marital status: Married    Spouse name: N/A  . Number of children: N/A  . Years of education: N/A   Occupational History  . Not on file.   Social History Main Topics  . Smoking status: Current Every Day Smoker    Packs/day: 1.50     Years: 40.00    Types: Cigarettes  . Smokeless tobacco: Never Used     Comment: has cut back to 1PPD  . Alcohol use No     Comment: ONCE OR TWICE A YR  . Drug use: No  . Sexual activity: Not on file   Other Topics Concern  . Not on file   Social History Narrative  . No narrative on file    Review of Systems: See HPI, otherwise negative ROS  Physical Exam: BP 122/88   Pulse 82   Temp (!) 96.8 F (36 C) (Tympanic)   Resp 16   Ht 5\' 7"  (1.702 m)   Wt 81.6 kg (180 lb)   SpO2 98%   BMI 28.19 kg/m  General:   Alert,  pleasant and cooperative in NAD Head:  Normocephalic and atraumatic. Neck:  Supple; no masses or thyromegaly. Lungs:  Clear throughout to auscultation.    Heart:  Regular rate and rhythm. Abdomen:  Soft, nontender and nondistended. Normal bowel sounds, without guarding, and without rebound.   Neurologic:  Alert and  oriented x4;  grossly normal neurologically.  Impression/Plan: Ryan [name] is here for an colonoscopy to be performed for colon cancer screening  Risks, benefits, limitations, and alternatives regarding  colonoscopy have been reviewed with the patient.  Questions have been answered.  All parties agreeable.   Lynnae PrudeELLIOTT, Danaija Eskridge, MD  04/19/2016, 2:51 PM

## 2016-04-19 NOTE — Op Note (Signed)
[facility]astroenter[facility] Patient Name: Ryan [name] Procedure Date: 04/19/2016 2:52 PM MRN: [id number] Account #: [id number] Date of Birth: 04-10-1955 Admit Type: Outpatient Age: [age] [facility] Gender: Male Note Status: Finalized Procedure:            Colonoscopy Indications:          Screening for colorectal malignant neoplasm Providers:            Scot Junobert T. Jaysun Wessels, MD Referring MD:         No Local Md, MD (Referring MD) Medicines:            Propofol per Anesthesia Complications:        No immediate complications. Procedure:            Pre-Anesthesia Assessment:                       - After reviewing the risks and benefits, the patient                        was deemed in satisfactory condition to undergo the                        procedure.                       After obtaining informed consent, the colonoscope was                        passed under direct vision. Throughout the procedure,                        the patient's blood pressure, pulse, and oxygen                        saturations were monitored continuously. The                        Colonoscope was introduced through the anus and                        advanced to the the cecum, identified by appendiceal                        orifice and ileocecal valve. The colonoscopy was                        performed without difficulty. The patient tolerated the                        procedure well. The quality of the bowel preparation                        was good. Findings:      A Branscum polyp was found in the recto-sigmoid colon. The polyp was       sessile. The polyp was removed with a hot snare. Resection and retrieval       were complete.      A few sessile polyps were found in the rectum. The polyps were       diminutive in size. These polyps were removed with a jumbo cold forceps.  Resection and retrieval were complete.      A Mroczkowski polyp was found in the rectum. The  polyp was sessile. The polyp       was removed with a hot snare. Resection and retrieval were complete.      The exam was otherwise without abnormality.      Internal hemorrhoids were found during endoscopy. The hemorrhoids were       Bradshaw and Grade I (internal hemorrhoids that do not prolapse). Impression:           - One Griesinger polyp at the recto-sigmoid colon, removed                        with a hot snare. Resected and retrieved.                       - A few diminutive polyps in the rectum, removed with a                        jumbo cold forceps. Resected and retrieved.                       - One Sperling polyp in the rectum, removed with a hot                        snare. Resected and retrieved.                       - The examination was otherwise normal.                       - Internal hemorrhoids. Recommendation:       - Await pathology results. TAKE STOOL SOFTENER TABLETS                        FOR 1-2 weeks. Scot Jun, MD 04/19/2016 3:26:08 PM This report has been signed electronically. Number of Addenda: 0 Note Initiated On: 04/19/2016 2:52 PM Scope Withdrawal Time: 0 hours 9 minutes 45 seconds  Total Procedure Duration: 0 hours 13 minutes 41 seconds       [facility]

## 2016-04-19 NOTE — Transfer of Care (Signed)
Immediate Anesthesia Transfer of Care Note  Patient: Ryan [name] Patient  Procedure(s) Performed: Procedure(s): COLONOSCOPY WITH PROPOFOL (N/A)  Patient Location: PACU  Anesthesia Type:General  Level of Consciousness: sedated  Airway & Oxygen Therapy: Patient Spontanous Breathing and Patient connected to nasal cannula oxygen  Post-op Assessment: Report given to RN and Post -op Vital signs reviewed and stable  Post vital signs: Reviewed and stable  Last Vitals:  Vitals:   04/19/16 1335 04/19/16 1525  BP: 122/88 98/69  Pulse: 82 83  Resp: 16 16  Temp: (!) 36 [name] (!) 35.6 [name]    Last Pain:  Vitals:   04/19/16 1525  TempSrc: Tympanic         Complications: No apparent anesthesia complications

## 2016-04-20 ENCOUNTER — Encounter: Payer: Self-pay | Admitting: Unknown Physician Specialty

## 2016-04-20 NOTE — Anesthesia Postprocedure Evaluation (Signed)
Anesthesia Post Note  Patient: Ryan [name]  Procedure(s) Performed: Procedure(s) (LRB): COLONOSCOPY WITH PROPOFOL (N/A)  Patient location during evaluation: PACU Anesthesia Type: General Level of consciousness: awake and alert Pain management: pain level controlled Vital Signs Assessment: post-procedure vital signs reviewed and stable Respiratory status: spontaneous breathing, nonlabored ventilation, respiratory function stable and patient connected to nasal cannula oxygen Cardiovascular status: blood pressure returned to baseline and stable Postop Assessment: no signs of nausea or vomiting Anesthetic complications: no     Last Vitals:  Vitals:   04/19/16 1545 04/19/16 1555  BP: 117/74 134/71  Pulse:    Resp:    Temp:      Last Pain:  Vitals:   04/20/16 0744  TempSrc:   PainSc: 0-No pain                 Yevette EdwardsJames G Adams

## 2016-04-21 LAB — SURGICAL PATHOLOGY

## 2016-08-04 ENCOUNTER — Emergency Department: Payer: Medicaid Other

## 2016-08-04 ENCOUNTER — Emergency Department
Admission: EM | Admit: 2016-08-04 | Discharge: 2016-08-04 | Disposition: A | Discharge status: Home / Self Care | Payer: Medicaid Other | Attending: Emergency Medicine | Admitting: Emergency Medicine

## 2016-08-04 DIAGNOSIS — I10 Essential (primary) hypertension: Secondary | ICD-10-CM | POA: Insufficient documentation

## 2016-08-04 DIAGNOSIS — Z79899 Other long term (current) drug therapy: Secondary | ICD-10-CM | POA: Insufficient documentation

## 2016-08-04 DIAGNOSIS — Z87448 Personal history of other diseases of urinary system: Secondary | ICD-10-CM | POA: Insufficient documentation

## 2016-08-04 DIAGNOSIS — J449 Chronic obstructive pulmonary disease, unspecified: Secondary | ICD-10-CM | POA: Insufficient documentation

## 2016-08-04 DIAGNOSIS — F17210 Nicotine dependence, cigarettes, uncomplicated: Secondary | ICD-10-CM | POA: Diagnosis not present

## 2016-08-04 DIAGNOSIS — Z87442 Personal history of urinary calculi: Secondary | ICD-10-CM | POA: Insufficient documentation

## 2016-08-04 DIAGNOSIS — R109 Unspecified abdominal pain: Secondary | ICD-10-CM | POA: Diagnosis not present

## 2016-08-04 LAB — URINALYSIS, COMPLETE (UACMP) WITH MICROSCOPIC
Bacteria, UA: NONE SEEN
Bilirubin Urine: NEGATIVE
Glucose, UA: NEGATIVE mg/dL
Hgb urine dipstick: NEGATIVE
Ketones, ur: NEGATIVE mg/dL
Leukocytes, UA: NEGATIVE
Nitrite: NEGATIVE
PROTEIN: NEGATIVE mg/dL
Specific Gravity, Urine: 1.012 (ref 1.005–1.030)
pH: 5 (ref 5.0–8.0)

## 2016-08-04 LAB — BASIC METABOLIC PANEL
Anion gap: 6 (ref 5–15)
BUN: 17 mg/dL (ref 6–20)
CHLORIDE: 107 mmol/L (ref 101–111)
CO2: 24 mmol/L (ref 22–32)
CREATININE: 0.79 mg/dL (ref 0.61–1.24)
Calcium: 8.9 mg/dL (ref 8.9–10.3)
GFR calc Af Amer: >60 mL/min (ref >60)
Glucose, Bld: 100 mg/dL — ABNORMAL HIGH (ref 65–99)
POTASSIUM: 4.6 mmol/L (ref 3.5–5.1)
SODIUM: 137 mmol/L (ref 135–145)

## 2016-08-04 LAB — CBC
HCT: 47.6 % (ref 40.0–52.0)
HEMOGLOBIN: 15.9 g/dL (ref 13.0–18.0)
MCH: 29.8 pg (ref 26.0–34.0)
MCHC: 33.5 g/dL (ref 32.0–36.0)
MCV: 89 fL (ref 80.0–100.0)
PLATELETS: 229 10*3/uL (ref 150–440)
RBC: 5.35 MIL/uL (ref 4.40–5.90)
RDW: 14.5 % (ref 11.5–14.5)
WBC: 11.6 10*3/uL — ABNORMAL HIGH (ref 3.8–10.6)

## 2016-08-04 MED ORDER — HYDROMORPHONE HCL 1 MG/ML IJ SOLN
INTRAMUSCULAR | Status: AC
  Administered 2016-08-04: 0.5 mg via INTRAVENOUS
  Filled 2016-08-04: qty 1

## 2016-08-04 MED ORDER — SODIUM CHLORIDE 0.9 % IV BOLUS (SEPSIS)
1000.0000 mL | Freq: Once | INTRAVENOUS | Status: AC
  Administered 2016-08-04: 1000 mL via INTRAVENOUS

## 2016-08-04 MED ORDER — DICLOFENAC SODIUM 3 % TD GEL: 1.0000 "application " | Freq: Two times a day (BID) | TRANSDERMAL | 0 refills | Status: DC | PRN

## 2016-08-04 MED ORDER — HYDROMORPHONE HCL 1 MG/ML IJ SOLN
0.5000 mg | Freq: Once | INTRAMUSCULAR | Status: AC
  Administered 2016-08-04: 0.5 mg via INTRAVENOUS

## 2016-08-04 MED ORDER — ONDANSETRON HCL 4 MG/2ML IJ SOLN
4.0000 mg | Freq: Once | INTRAMUSCULAR | Status: AC
  Administered 2016-08-04: 4 mg via INTRAVENOUS
  Filled 2016-08-04: qty 2

## 2016-08-04 MED ORDER — KETOROLAC TROMETHAMINE 30 MG/ML IJ SOLN
30.0000 mg | Freq: Once | INTRAMUSCULAR | Status: AC
  Administered 2016-08-04: 30 mg via INTRAVENOUS
  Filled 2016-08-04: qty 1

## 2016-08-04 MED ORDER — HYDROMORPHONE HCL 1 MG/ML IJ SOLN
1.0000 mg | Freq: Once | INTRAMUSCULAR | Status: AC
  Administered 2016-08-04: 1 mg via INTRAVENOUS
  Filled 2016-08-04: qty 1

## 2016-08-04 NOTE — ED Triage Notes (Signed)
Left sided flank pain that began last night, pt hx of kidney stones; this feels similar per patient report. Nausea no vomiting.

## 2016-08-04 NOTE — ED Provider Notes (Signed)
[facility] Emergency Department Provider Note  ____________________________________________   First MD Initiated Contact with Patient 08/04/16 1440     (approximate)  I have reviewed the triage vital signs and the nursing notes.   HISTORY  Chief Complaint Flank Pain   HPI Ryan [name] is a 61 y.o. male with a history of chronic kidney disease as well as kidney stones was presented with left-sided flank pain that started suddenly last night. He describes a 10 out of 10 sharp pain that is radiating around from the left flank to left lower abdomen. He denies any burning with urination, fever or blood in his urine. Says that his last stone was years ago. Says that he has been nauseous but without any vomiting.   Past Medical History:  Diagnosis Date  . Arthritis    left hand  . Asthma   . Chronic kidney disease    HAS HAD KIDNEY STONE 2015-- JUST PASSED  . COPD (chronic obstructive pulmonary disease) (HCC)   . Dyspnea   . GERD (gastroesophageal reflux disease)    TAKES TUMS FOR RELIEF  . Hyperlipidemia   . Hypertension    Improved, No longer on meds  . Lower urinary tract symptoms (LUTS)   . Wears dentures    hass full upper plate, not wearing, broken  . Wears hearing aid     Patient Active Problem List   Diagnosis Date Noted  . Lumbar herniated disc 06/18/2014  . DDD (degenerative disc disease), lumbosacral 06/02/2014  . Spinal stenosis, lumbar region, with neurogenic claudication 06/02/2014  . Lumbar radiculopathy 06/02/2014  . Airway hyperreactivity 04/19/2014  . Chronic LBP 04/19/2014  . BP (high blood pressure) 04/19/2014  . Temporary cerebral vascular dysfunction 06/06/2013  . Compulsive tobacco user syndrome 05/22/2013  . 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 Lower urinary tract symptoms 12/27/2012    Past Surgical History:  Procedure Laterality Date  . BACK SURGERY    . CATARACT EXTRACTION W/PHACO Left  01/06/2016   Procedure: CATARACT EXTRACTION PHACO AND INTRAOCULAR LENS PLACEMENT (IOC);  Surgeon: Nevada CraneBradley Mark King, MD;  [facility];  Service: Ophthalmology;  Laterality: Left;  LEFT  . COLONOSCOPY WITH PROPOFOL N/A 04/19/2016   Procedure: COLONOSCOPY WITH PROPOFOL;  Surgeon: Scot Junobert T Elliott, MD;  [facility];  Service: Endoscopy;  Laterality: N/A;  . ESOPHAGOGASTRODUODENOSCOPY (EGD) WITH PROPOFOL N/A 10/24/2015   Procedure: ESOPHAGOGASTRODUODENOSCOPY (EGD) WITH PROPOFOL;  Surgeon: Scot Junobert T Elliott, MD;  [facility];  Service: Endoscopy;  Laterality: N/A;  . EYE SURGERY    . HERNIA REPAIR     ERRONEOUS UMBILICAL HERNIA   2011  . MYRINGOTOMY WITH TUBE PLACEMENT Bilateral 03/28/2015   Procedure: MYRINGOTOMY WITH TUBE PLACEMENT;  Surgeon: Linus Salmonshapman McQueen, MD;  [facility];  Service: ENT;  Laterality: Bilateral;  BUTTERFLY TUBE  . TONSILLECTOMY    . TRANSFORAMINAL LUMBAR INTERBODY FUSION (TLIF) WITH PEDICLE SCREW FIXATION 1 LEVEL Right 06/18/2014   Procedure: TRANSFORAMINAL LUMBAR INTERBODY FUSION (TLIF) WITH PEDICLE SCREW FIXATION 1 LEVEL LUMBAR 5 -SACRAL 1;  Surgeon: Aliene Beamsandy Kritzer, MD;  Location: [facility];  Service: Neurosurgery;  Laterality: Right;  Right transforaminal lumbar interbody fusion with interbody prosthesis and percutaneous pedicle screws Lumbar 5 to Sacral 1    Prior to Admission medications   Medication Sig Start Date End Date Taking? Authorizing Provider  acetaminophen (TYLENOL) 325 MG tablet Take 650 mg by mouth every 6 (six) hours as needed for mild  pain or headache.     [provider]  albuterol (PROVENTIL HFA;VENTOLIN HFA) 108 (90 BASE) MCG/ACT inhaler Inhale 1-2 puffs into the lungs every 6 (six) hours as needed for wheezing or shortness of breath.     [provider]  ALPRAZolam Prudy Feeler) 1 MG tablet Take 1 mg by mouth at bedtime as needed for anxiety.    [provider]  cetirizine  (ZYRTEC) 10 MG tablet Take 10 mg by mouth daily.    [provider]  cyclobenzaprine (FLEXERIL) 10 MG tablet Take 10 mg by mouth at bedtime as needed for muscle spasms.    [provider]  fluticasone (FLONASE) 50 MCG/ACT nasal spray Place into both nostrils daily.    [provider]  gabapentin (NEURONTIN) 300 MG capsule Take 600 mg by mouth 3 (three) times daily.    [provider]  lisinopril (PRINIVIL,ZESTRIL) 20 MG tablet Take 10 mg by mouth daily.    [provider]  Multiple Vitamin (MULTIVITAMIN) tablet Take 1 tablet by mouth daily.    [provider]  omeprazole (PRILOSEC) 40 MG capsule Take 40 mg by mouth daily.    [provider]  tamsulosin (FLOMAX) 0.4 MG CAPS capsule Take 0.8 mg by mouth daily.  02/07/14   [provider]    Allergies Patient has no known allergies.  Family History  Problem Relation Age of Onset  . Arthritis Mother   . Hypertension Mother   . Diabetes Father   . Hypertension Father     Social History Social History  Substance Use Topics  . Smoking status: Current Every Day Smoker    Packs/day: 1.50    Years: 40.00    Types: Cigarettes  . Smokeless tobacco: Never Used     Comment: has cut back to 1PPD  . Alcohol use No     Comment: ONCE OR TWICE A YR    Review of Systems  Constitutional: No fever/chills Eyes: No visual changes. ENT: No sore throat. Cardiovascular: Denies chest pain. Respiratory: Denies shortness of breath. Gastrointestinal:  no vomiting.  No diarrhea.  No constipation. Genitourinary: Negative for dysuria. Musculoskeletal: as above Skin: Negative for rash. Neurological: Negative for headaches, focal weakness or numbness.   ____________________________________________   PHYSICAL EXAM:  VITAL SIGNS: ED Triage Vitals [08/04/16 1429]  Enc Vitals Group     BP 108/65     Pulse Rate 65     Resp 18     Temp 98.8 F (37.1 C)     Temp Source Oral      SpO2 96 %     Weight 175 lb (79.4 kg)     Height 5\' 7"  (1.702 m)     Head Circumference      Peak Flow      Pain Score 7     Pain Loc      Pain Edu?      Excl. in GC?     Constitutional: Alert and oriented.Appears uncomfortable. Rocking back and forth on the bed. Eyes: Conjunctivae are normal.  Head: Atraumatic. Nose: No congestion/rhinnorhea. Mouth/Throat: Mucous membranes are moist.  Neck: No stridor.   Cardiovascular: Normal rate, regular rhythm. Grossly normal heart sounds.   Respiratory: Normal respiratory effort.  No retractions. Lungs CTAB. Gastrointestinal: Soft and nontender. No distention. Positive for left-sided CVA tenderness palpation. Musculoskeletal: No lower extremity tenderness nor edema.  No joint effusions. Neurologic:  Normal speech and language. No gross focal neurologic deficits are appreciated. Skin:  Skin  is warm, dry and intact. No rash noted. Psychiatric: Mood and affect are normal. Speech and behavior are normal.  ____________________________________________   LABS (all labs ordered are listed, but only abnormal results are displayed)  Labs Reviewed  URINALYSIS, COMPLETE (UACMP) WITH MICROSCOPIC - Abnormal; Notable for the following:       Result Value   Color, Urine YELLOW (*)    APPearance CLEAR (*)    Squamous Epithelial / LPF 0-5 (*)    All other components within normal limits  CBC - Abnormal; Notable for the following:    WBC 11.6 (*)    All other components within normal limits  BASIC METABOLIC PANEL - Abnormal; Notable for the following:    Glucose, Bld 100 (*)    All other components within normal limits   ____________________________________________  EKG   ____________________________________________  RADIOLOGY  Left nephrolithiasis without any ureteral calculus or sign of obstruction. ____________________________________________   PROCEDURES  Procedure(s) performed:   Procedures  Critical Care performed:    ____________________________________________   INITIAL IMPRESSION / ASSESSMENT AND PLAN / ED COURSE  Pertinent labs & imaging results that were available during my care of the patient were reviewed by me and considered in my medical decision making (see chart for details).  ----------------------------------------- 5:52 PM on 08/04/2016 -----------------------------------------  Patient without any aspiration versus pain on his CT scan. Patient after 2 doses of Dilaudid and Toradol is not in any distress but still says that his pain is a 10 out of 10. Despite this, he is walking throughout the room and drinking soda. He says that when he does have pain now is worse with movement and is improved by him pushing his side up against the bed. Suspecting musculoskeletal pain. Patient with bilateral and equal radial as well as dorsalis pedis pulses.  the patient is understanding of the plan and willing to comply. 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 However, very likely to be dissection given that the patient is neurovascularly intact. Pain the left flank without any radiation to the back. Normal vital signs as well as very reassuring lab results. Patient will follow up with his Dr. Phineas Real.      ____________________________________________   FINAL CLINICAL IMPRESSION(S) / ED DIAGNOSES  Final diagnoses:  Left flank pain      NEW MEDICATIONS STARTED DURING THIS VISIT:  New Prescriptions   No medications on file     Note:  This document was prepared using Dragon voice recognition software and may include unintentional dictation errors.     Myrna Blazer, MD 08/04/16 [phone]

## 2016-08-04 NOTE — ED Notes (Signed)
Pt given coke to drink 

## 2016-08-04 NOTE — ED Notes (Signed)
Pt states hx kidney stones. States L side flank/back pain that began yest. Denies urinary frequency, dysuria, or fever. States has been able to pass stones and has had lithotripsy before. Alert, oriented.    Given urinal to attempt urine sample.

## 2016-11-06 ENCOUNTER — Emergency Department
Admission: EM | Admit: 2016-11-06 | Discharge: 2016-11-06 | Disposition: A | Discharge status: Home / Self Care | Payer: Medicaid Other | Attending: Emergency Medicine | Admitting: Emergency Medicine

## 2016-11-06 ENCOUNTER — Encounter: Payer: Self-pay | Admitting: Emergency Medicine

## 2016-11-06 DIAGNOSIS — L2489 Irritant contact dermatitis due to other agents: Secondary | ICD-10-CM | POA: Diagnosis not present

## 2016-11-06 DIAGNOSIS — J45909 Unspecified asthma, uncomplicated: Secondary | ICD-10-CM | POA: Insufficient documentation

## 2016-11-06 DIAGNOSIS — R21 Rash and other nonspecific skin eruption: Secondary | ICD-10-CM

## 2016-11-06 DIAGNOSIS — N189 Chronic kidney disease, unspecified: Secondary | ICD-10-CM | POA: Insufficient documentation

## 2016-11-06 DIAGNOSIS — Z79899 Other long term (current) drug therapy: Secondary | ICD-10-CM | POA: Diagnosis not present

## 2016-11-06 DIAGNOSIS — I129 Hypertensive chronic kidney disease with stage 1 through stage 4 chronic kidney disease, or unspecified chronic kidney disease: Secondary | ICD-10-CM | POA: Insufficient documentation

## 2016-11-06 DIAGNOSIS — F17210 Nicotine dependence, cigarettes, uncomplicated: Secondary | ICD-10-CM | POA: Insufficient documentation

## 2016-11-06 DIAGNOSIS — J449 Chronic obstructive pulmonary disease, unspecified: Secondary | ICD-10-CM | POA: Diagnosis not present

## 2016-11-06 MED ORDER — DEXAMETHASONE SODIUM PHOSPHATE 10 MG/ML IJ SOLN
10.0000 mg | Freq: Once | INTRAMUSCULAR | Status: AC
  Administered 2016-11-06: 10 mg via INTRAMUSCULAR
  Filled 2016-11-06: qty 1

## 2016-11-06 MED ORDER — DIPHENHYDRAMINE HCL 25 MG PO CAPS: 25.0000 mg | ORAL_CAPSULE | Freq: Four times a day (QID) | ORAL | 0 refills | Status: DC | PRN

## 2016-11-06 MED ORDER — PREDNISONE 10 MG PO TABS: 10.0000 mg | ORAL_TABLET | Freq: Every day | ORAL | 0 refills | Status: DC

## 2016-11-06 NOTE — ED Triage Notes (Signed)
Patient presents to the ED with rash to his face.  Patient reports cutting down a tree yesterday. Patient states swelling and itching started yesterday and got worse today.  Patient denies any shortness of breath or oral swelling.  Patient is in no obvious distress at this time.

## 2016-11-06 NOTE — ED Provider Notes (Signed)
[facility]-EMERGENCY DEPARTMENT Provider Note   CSN: [id number] Arrival date & time: 11/06/16  1033     History   Chief Complaint Chief Complaint  Patient presents with  . Rash    HPI Ryan [name] is a 61 y.o. male for evaluation of facial rash. Patient states he developed redness to his face yesterday. Yesterday he was cutting trees, states he weight his gloves onand believes he developed a rash from poison ivy. Patient's rash is pruritic. He denies any difficulty swallowing, breathing, chest pain or shortness of breath. He has not taken any medications for the rash or itching. No history of anaphylaxis. He denies any pain discomfort or vision changes.  HPI  Past Medical History:  Diagnosis Date  . Arthritis    left hand  . Asthma   . Chronic kidney disease    HAS HAD KIDNEY STONE 2015-- JUST PASSED  . COPD (chronic obstructive pulmonary disease) (HCC)   . Dyspnea   . GERD (gastroesophageal reflux disease)    TAKES TUMS FOR RELIEF  . Hyperlipidemia   . Hypertension    Improved, No longer on meds  . Lower urinary tract symptoms (LUTS)   . Wears dentures    hass full upper plate, not wearing, broken  . Wears hearing aid     Patient Active Problem List   Diagnosis Date Noted  . Lumbar herniated disc 06/18/2014  . DDD (degenerative disc disease), lumbosacral 06/02/2014  . Spinal stenosis, lumbar region, with neurogenic claudication 06/02/2014  . Lumbar radiculopathy 06/02/2014  . Airway hyperreactivity 04/19/2014  . Chronic LBP 04/19/2014  . BP (high blood pressure) 04/19/2014  . Temporary cerebral vascular dysfunction 06/06/2013  . Compulsive tobacco user syndrome 05/22/2013  . 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 Lower urinary tract symptoms 12/27/2012    Past Surgical History:  Procedure Laterality Date  . BACK SURGERY    . CATARACT EXTRACTION W/PHACO Left 01/06/2016   Procedure: CATARACT EXTRACTION PHACO AND INTRAOCULAR LENS PLACEMENT  (IOC);  Surgeon: Nevada Crane, MD;  Location: [facility];  Service: Ophthalmology;  Laterality: Left;  LEFT  . COLONOSCOPY WITH PROPOFOL N/A 04/19/2016   Procedure: COLONOSCOPY WITH PROPOFOL;  Surgeon: Scot Jun, MD;  Location: [facility];  Service: Endoscopy;  Laterality: N/A;  . ESOPHAGOGASTRODUODENOSCOPY (EGD) WITH PROPOFOL N/A 10/24/2015   Procedure: ESOPHAGOGASTRODUODENOSCOPY (EGD) WITH PROPOFOL;  Surgeon: Scot Jun, MD;  Location: [facility];  Service: Endoscopy;  Laterality: N/A;  . EYE SURGERY    . HERNIA REPAIR     ERRONEOUS UMBILICAL HERNIA   2011  . MYRINGOTOMY WITH TUBE PLACEMENT Bilateral 03/28/2015   Procedure: MYRINGOTOMY WITH TUBE PLACEMENT;  Surgeon: Linus Salmons, MD;  Location: [facility];  Service: ENT;  Laterality: Bilateral;  BUTTERFLY TUBE  . TONSILLECTOMY    . TRANSFORAMINAL LUMBAR INTERBODY FUSION (TLIF) WITH PEDICLE SCREW FIXATION 1 LEVEL Right 06/18/2014   Procedure: TRANSFORAMINAL LUMBAR INTERBODY FUSION (TLIF) WITH PEDICLE SCREW FIXATION 1 LEVEL LUMBAR 5 -SACRAL 1;  Surgeon: Aliene Beams, MD;  Location: [facility];  Service: Neurosurgery;  Laterality: Right;  Right transforaminal lumbar interbody fusion with interbody prosthesis and percutaneous pedicle screws Lumbar 5 to Sacral 1       Home Medications    Prior to Admission medications   Medication Sig Start Date End Date Taking? Authorizing Provider  acetaminophen (TYLENOL) 325 MG tablet Take 650 mg by mouth every 6 (six) hours as needed for mild pain or headache.  [provider]  albuterol (PROVENTIL HFA;VENTOLIN HFA) 108 (90 BASE) MCG/ACT inhaler Inhale 1-2 puffs into the lungs every 6 (six) hours as needed for wheezing or shortness of breath.     [provider]  ALPRAZolam Prudy Feeler) 1 MG tablet Take 1 mg by mouth at bedtime as needed for anxiety.    [provider]  cetirizine (ZYRTEC) 10 MG tablet Take 10 mg by mouth daily.     [provider]  cyclobenzaprine (FLEXERIL) 10 MG tablet Take 10 mg by mouth at bedtime as needed for muscle spasms.    [provider]  Diclofenac Sodium 3 % GEL Place 1 application onto the skin every 12 (twelve) hours as needed. 08/04/16   Schaevitz, Myra Rude, MD  diphenhydrAMINE (BENADRYL) 25 mg capsule Take 1 capsule (25 mg total) by mouth every 6 (six) hours as needed. 11/06/16   Evon Slack, PA-C  fluticasone (FLONASE) 50 MCG/ACT nasal spray Place into both nostrils daily.    [provider]  gabapentin (NEURONTIN) 300 MG capsule Take 600 mg by mouth 3 (three) times daily.    [provider]  lisinopril (PRINIVIL,ZESTRIL) 20 MG tablet Take 10 mg by mouth daily.    [provider]  Multiple Vitamin (MULTIVITAMIN) tablet Take 1 tablet by mouth daily.    [provider]  omeprazole (PRILOSEC) 40 MG capsule Take 40 mg by mouth daily.    [provider]  predniSONE (DELTASONE) 10 MG tablet Take 1 tablet (10 mg total) by mouth daily. 6,5,4,3,2,1 six day taper 11/06/16   Evon Slack, PA-C  tamsulosin (FLOMAX) 0.4 MG CAPS capsule Take 0.8 mg by mouth daily.  02/07/14   [provider]    Family History Family History  Problem Relation Age of Onset  . Arthritis Mother   . Hypertension Mother   . Diabetes Father   . Hypertension Father     Social History Social History  Substance Use Topics  . Smoking status: Current Every Day Smoker    Packs/day: 1.50    Years: 40.00    Types: Cigarettes  . Smokeless tobacco: Never Used     Comment: has cut back to 1PPD  . Alcohol use No     Comment: ONCE OR TWICE A YR     Allergies   Patient has no known allergies.   Review of Systems Review of Systems  Constitutional: Negative for fever.  HENT: Negative for trouble swallowing.   Eyes: Negative for pain, discharge, redness and visual disturbance.  Respiratory: Negative for choking, chest tightness and  shortness of breath.   Cardiovascular: Negative for chest pain.  Gastrointestinal: Negative for abdominal pain.  Genitourinary: Negative for difficulty urinating, dysuria and urgency.  Musculoskeletal: Negative for back pain and myalgias.  Skin: Positive for rash.  Neurological: Negative for dizziness and headaches.     Physical Exam Updated Vital Signs BP 139/78 (BP Location: Left Arm)   Pulse 86   Temp 97.7 F (36.5 C) (Oral)   Resp 18   Ht  (1.676 m)   Wt 79.4 kg (175 lb)   SpO2 99%   BMI 28.25 kg/m   Physical Exam  Constitutional: He is oriented to person, place, and time. He appears well-developed and well-nourished.  HENT:  Head: Normocephalic and atraumatic.  No signs of angioedema. Mild erythema along the forehead into the upper eyelids. Rash is macular and consolidated with no oozing. No fluctuance.  Eyes: Conjunctivae are normal.  Neck: Normal  range of motion.  Cardiovascular: Normal rate.   Pulmonary/Chest: Effort normal. No respiratory distress. He has no wheezes. He has no rales.  Musculoskeletal: Normal range of motion.  Neurological: He is alert and oriented to person, place, and time.  Skin: Skin is warm. Rash noted. There is erythema.  Psychiatric: He has a normal mood and affect. His behavior is normal. Thought content normal.     ED Treatments / Results  Labs (all labs ordered are listed, but only abnormal results are displayed) Labs Reviewed - No data to display  EKG  EKG Interpretation None       Radiology No results found.  Procedures Procedures (including critical care time)  Medications Ordered in ED Medications  dexamethasone (DECADRON) injection 10 mg (not administered)     Initial Impression / Assessment and Plan / ED Course  I have reviewed the triage vital signs and the nursing notes.  Pertinent labs & imaging results that were available during my care of the patient were reviewed by me and considered in my medical  decision making (see chart for details).     61 year old male with rash that developed yesterday. Rash is consistent with contact dermatitis. No sign of cellulitis or angioedema. Patient given 10 mg of dexamethasone IM and will be given a prescription for Benadryl and 60 steroid taper. He is educated on signs and symptoms to return to the ED for.  Final Clinical Impressions(s) / ED Diagnoses   Final diagnoses:  Irritant contact dermatitis due to other agents  Facial rash    New Prescriptions New Prescriptions   DIPHENHYDRAMINE (BENADRYL) 25 MG CAPSULE    Take 1 capsule (25 mg total) by mouth every 6 (six) hours as needed.   PREDNISONE (DELTASONE) 10 MG TABLET    Take 1 tablet (10 mg total) by mouth daily. 6,5,4,3,2,1 six day taper     Ronnette Juniper 11/06/16 1054    Governor Rooks, MD 11/06/16 [phone]

## 2016-11-06 NOTE — Discharge Instructions (Signed)
Please take medications as prescribed. Return to the ER immediately for any increase in swelling, difficulty swallowing, difficulty breathing, chest tightness.

## 2016-11-10 ENCOUNTER — Emergency Department
Admission: EM | Admit: 2016-11-10 | Discharge: 2016-11-10 | Disposition: A | Discharge status: Home / Self Care | Payer: Medicaid Other | Attending: Emergency Medicine | Admitting: Emergency Medicine

## 2016-11-10 ENCOUNTER — Encounter: Payer: Self-pay | Admitting: *Deleted

## 2016-11-10 DIAGNOSIS — Z79899 Other long term (current) drug therapy: Secondary | ICD-10-CM | POA: Diagnosis not present

## 2016-11-10 DIAGNOSIS — F17210 Nicotine dependence, cigarettes, uncomplicated: Secondary | ICD-10-CM | POA: Diagnosis not present

## 2016-11-10 DIAGNOSIS — L249 Irritant contact dermatitis, unspecified cause: Secondary | ICD-10-CM | POA: Insufficient documentation

## 2016-11-10 DIAGNOSIS — I129 Hypertensive chronic kidney disease with stage 1 through stage 4 chronic kidney disease, or unspecified chronic kidney disease: Secondary | ICD-10-CM | POA: Insufficient documentation

## 2016-11-10 DIAGNOSIS — R21 Rash and other nonspecific skin eruption: Secondary | ICD-10-CM | POA: Diagnosis present

## 2016-11-10 DIAGNOSIS — J449 Chronic obstructive pulmonary disease, unspecified: Secondary | ICD-10-CM | POA: Insufficient documentation

## 2016-11-10 DIAGNOSIS — N189 Chronic kidney disease, unspecified: Secondary | ICD-10-CM | POA: Diagnosis not present

## 2016-11-10 DIAGNOSIS — J45909 Unspecified asthma, uncomplicated: Secondary | ICD-10-CM | POA: Insufficient documentation

## 2016-11-10 MED ORDER — METHYLPREDNISOLONE SODIUM SUCC 125 MG IJ SOLR
125.0000 mg | Freq: Once | INTRAMUSCULAR | Status: AC
  Administered 2016-11-10: 125 mg via INTRAMUSCULAR
  Filled 2016-11-10: qty 2

## 2016-11-10 MED ORDER — PREDNISONE 10 MG (21) PO TBPK: ORAL_TABLET | ORAL | 0 refills | Status: DC

## 2016-11-10 MED ORDER — METHYLPREDNISOLONE SODIUM SUCC 125 MG IJ SOLR: 125.0000 mg | Freq: Once | INTRAMUSCULAR | Status: DC

## 2016-11-10 NOTE — ED Triage Notes (Signed)
Pt to ED reporting he was exposed to poison ivy on Saturday on his face. PT came to ED and was given steroids and benadryl. Pt back to ED with a red rash on face. No reports of throat swelling or increased WOB. No fevers reported or changes in vision.

## 2016-11-10 NOTE — ED Notes (Signed)
Called pt to take to room twice but no answer.

## 2016-11-10 NOTE — ED Notes (Signed)
Pt. Going home with discharge and instructions.

## 2016-11-10 NOTE — ED Provider Notes (Signed)
[facility] Emergency Department Provider Note  ____________________________________________  Time seen: Approximately 9:51 PM  I have reviewed the triage vital signs and the nursing notes.   HISTORY  Chief Complaint Rash    HPI Ryan [name] is a 61 y.o. male presents to the emergency department with a rebound of poison ivy contact dermatitis. Patient was seen on 11/06/2016. He states that his poison ivy contact dermatitis initially improved but then acutely worsened. Patient was only prescribed a 5 day supply of prednisone. Patient denies shortness of breath, chest tightness, nausea, vomiting or emesis No diarrhea.   Past Medical History:  Diagnosis Date  . Arthritis    left hand  . Asthma   . Chronic kidney disease    HAS HAD KIDNEY STONE 2015-- JUST PASSED  . COPD (chronic obstructive pulmonary disease) (HCC)   . Dyspnea   . GERD (gastroesophageal reflux disease)    TAKES TUMS FOR RELIEF  . Hyperlipidemia   . Hypertension    Improved, No longer on meds  . Lower urinary tract symptoms (LUTS)   . Wears dentures    hass full upper plate, not wearing, broken  . Wears hearing aid     Patient Active Problem List   Diagnosis Date Noted  . Lumbar herniated disc 06/18/2014  . DDD (degenerative disc disease), lumbosacral 06/02/2014  . Spinal stenosis, lumbar region, with neurogenic claudication 06/02/2014  . Lumbar radiculopathy 06/02/2014  . Airway hyperreactivity 04/19/2014  . Chronic LBP 04/19/2014  . BP (high blood pressure) 04/19/2014  . Temporary cerebral vascular dysfunction 06/06/2013  . Compulsive tobacco user syndrome 05/22/2013  . 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 Lower urinary tract symptoms 12/27/2012    Past Surgical History:  Procedure Laterality Date  . BACK SURGERY    . CATARACT EXTRACTION W/PHACO Left 01/06/2016   Procedure: CATARACT EXTRACTION PHACO AND INTRAOCULAR LENS PLACEMENT (IOC);  Surgeon:  Nevada CraneBradley Mark King, MD;  [facility];  Service: Ophthalmology;  Laterality: Left;  LEFT  . COLONOSCOPY WITH PROPOFOL N/A 04/19/2016   Procedure: COLONOSCOPY WITH PROPOFOL;  Surgeon: Scot Junobert T Elliott, MD;  [facility] END[facility]COPY;  Service: Endoscopy;  Laterality: N/A;  . ESOPHAGOGASTRODUODENOSCOPY (EGD) WITH PROPOFOL N/A 10/24/2015   Procedure: ESOPHAGOGASTRODUODENOSCOPY (EGD) WITH PROPOFOL;  Surgeon: Scot Junobert T Elliott, MD;  [facility];  Service: Endoscopy;  Laterality: N/A;  . EYE SURGERY    . HERNIA REPAIR     ERRONEOUS UMBILICAL HERNIA   2011  . MYRINGOTOMY WITH TUBE PLACEMENT Bilateral 03/28/2015   Procedure: MYRINGOTOMY WITH TUBE PLACEMENT;  Surgeon: Linus Salmonshapman McQueen, MD;  [facility];  Service: ENT;  Laterality: Bilateral;  BUTTERFLY TUBE  . TONSILLECTOMY    . TRANSFORAMINAL LUMBAR INTERBODY FUSION (TLIF) WITH PEDICLE SCREW FIXATION 1 LEVEL Right 06/18/2014   Procedure: TRANSFORAMINAL LUMBAR INTERBODY FUSION (TLIF) WITH PEDICLE SCREW FIXATION 1 LEVEL LUMBAR 5 -SACRAL 1;  Surgeon: Aliene Beamsandy Kritzer, MD;  Location: [facility];  Service: Neurosurgery;  Laterality: Right;  Right transforaminal lumbar interbody fusion with interbody prosthesis and percutaneous pedicle screws Lumbar 5 to Sacral 1    Prior to Admission medications   Medication Sig Start Date End Date Taking? Authorizing Provider  acetaminophen (TYLENOL) 325 MG tablet Take 650 mg by mouth every 6 (six) hours as needed for mild pain or headache.     [provider]  albuterol (PROVENTIL HFA;VENTOLIN HFA) 108 (90 BASE) MCG/ACT inhaler Inhale 1-2 puffs into the lungs every 6 (six)  hours as needed for wheezing or shortness of breath.     [provider]  ALPRAZolam Prudy Feeler) 1 MG tablet Take 1 mg by mouth at bedtime as needed for anxiety.    [provider]  cetirizine (ZYRTEC) 10 MG tablet Take 10 mg by mouth daily.    [provider]  cyclobenzaprine  (FLEXERIL) 10 MG tablet Take 10 mg by mouth at bedtime as needed for muscle spasms.    [provider]  Diclofenac Sodium 3 % GEL Place 1 application onto the skin every 12 (twelve) hours as needed. 08/04/16   Schaevitz, Myra Rude, MD  diphenhydrAMINE (BENADRYL) 25 mg capsule Take 1 capsule (25 mg total) by mouth every 6 (six) hours as needed. 11/06/16   Evon Slack, PA-C  fluticasone (FLONASE) 50 MCG/ACT nasal spray Place into both nostrils daily.    [provider]  gabapentin (NEURONTIN) 300 MG capsule Take 600 mg by mouth 3 (three) times daily.    [provider]  lisinopril (PRINIVIL,ZESTRIL) 20 MG tablet Take 10 mg by mouth daily.    [provider]  Multiple Vitamin (MULTIVITAMIN) tablet Take 1 tablet by mouth daily.    [provider]  omeprazole (PRILOSEC) 40 MG capsule Take 40 mg by mouth daily.    [provider]  predniSONE (STERAPRED UNI-PAK 21 TAB) 10 MG (21) TBPK tablet Take 6 tabs the the 1st day. Take 6 tabs the the 2nd day. Take 5 tabs the the 3rd day. Take 5 tabs the 4th day. Take 4 tabs the the 5th day.Take 4 tabs the the 6th day.Take 3 tabs the 7th day.Take 3 tabs the 8th day. Take 2 tabs the 9th day. Take 2 tabs the 10th day. Take 1 tab the 11th day. Take 1 tab the 12th day. 11/10/16   Orvil Feil, PA-C  tamsulosin (FLOMAX) 0.4 MG CAPS capsule Take 0.8 mg by mouth daily.  02/07/14   [provider]    Allergies Patient has no known allergies.  Family History  Problem Relation Age of Onset  . Arthritis Mother   . Hypertension Mother   . Diabetes Father   . Hypertension Father     Social History Social History  Substance Use Topics  . Smoking status: Current Every Day Smoker    Packs/day: 1.50    Years: 40.00    Types: Cigarettes  . Smokeless tobacco: Never Used     Comment: has cut back to 1PPD  . Alcohol use No     Comment: ONCE OR TWICE A YR     Review of Systems  Constitutional: No  fever/chills Eyes: No visual changes. No discharge ENT: No upper respiratory complaints. Cardiovascular: no chest pain. Respiratory: no cough. No SOB. Gastrointestinal: No abdominal pain.  No nausea, no vomiting.  No diarrhea.  No constipation. Musculoskeletal: Negative for musculoskeletal pain. Skin: positive for poison ivy dermatitis Neurological: Negative for headaches, focal weakness or numbness.   ____________________________________________   PHYSICAL EXAM:  VITAL SIGNS: ED Triage Vitals  Enc Vitals Group     BP 11/10/16 1938 137/79     Pulse Rate 11/10/16 1938 75     Resp 11/10/16 1938 16     Temp 11/10/16 1938 (!) 97.5 F (36.4 C)     Temp Source 11/10/16 1938 Oral     SpO2 11/10/16 1938 97 %     Weight 11/10/16 1938 175 lb (79.4 kg)     Height 11/10/16 1938 5\' 6"  (1.676  m)     Head Circumference --      Peak Flow --      Pain Score 11/10/16 1937 4     Pain Loc --      Pain Edu? --      Excl. in GC? --      Constitutional: Alert and oriented. Well appearing and in no acute distress. Eyes: Conjunctivae are normal. PERRL. EOMI. Head: Atraumatic. ENT:         Mouth/Throat: Mucous membranes are moist. Uvula is midline. Cardiovascular: Normal rate, regular rhythm. Normal S1 and S2.  Good peripheral circulation. Respiratory: Normal respiratory effort without tachypnea or retractions. Lungs CTAB. Good air entry to the bases with no decreased or absent breath sounds. Gastrointestinal: Bowel sounds 4 quadrants. Soft and nontender to palpation. No guarding or rigidity. No palpable masses. No distention. No CVA tenderness. Musculoskeletal: Full range of motion to all extremities. No gross deformities appreciated. Neurologic:  Normal speech and language. No gross focal neurologic deficits are appreciated.  Skin: Patient has linear, erythematous rash of the face and chest. Psychiatric: Mood and affect are normal. Speech and behavior are normal. Patient exhibits appropriate  insight and judgement.   ____________________________________________   LABS (all labs ordered are listed, but only abnormal results are displayed)  Labs Reviewed - No data to display ____________________________________________  EKG   ____________________________________________  RADIOLOGY   No results found.  ____________________________________________    PROCEDURES  Procedure(s) performed:    Procedures    Medications  methylPREDNISolone sodium succinate (SOLU-MEDROL) 125 mg/2 mL injection 125 mg (not administered)     ____________________________________________   INITIAL IMPRESSION / ASSESSMENT AND PLAN / ED COURSE  Pertinent labs & imaging results that were available during my care of the patient were reviewed by me and considered in my medical decision making (see chart for details).  Review of the [place] CSRS was performed in accordance of the [facility] prior to dispensing any controlled drugs.     Assessment and plan Poison ivy dermatitis Patient presents to the emergency department with a rebound of poison ivy contact dermatitis. Patient was given an injection of Solu-Medrol in the emergency department and discharged with a 12 day taper of prednisone. Strict return precautions were given to return to the emergency department for new or worsening symptoms. Patient voiced understanding regarding this recommendation. Patient was advised to follow-up with primary care as needed. All patient questions were answered.    ____________________________________________  FINAL CLINICAL IMPRESSION(S) / ED DIAGNOSES  Final diagnoses:  Irritant contact dermatitis, unspecified trigger      NEW MEDICATIONS STARTED DURING THIS VISIT:  New Prescriptions   PREDNISONE (STERAPRED UNI-PAK 21 TAB) 10 MG (21) TBPK TABLET    Take 6 tabs the the 1st day. Take 6 tabs the the 2nd day. Take 5 tabs the the 3rd day. Take 5 tabs the 4th day. Take 4 tabs the the 5th day.Take 4  tabs the the 6th day.Take 3 tabs the 7th day.Take 3 tabs the 8th day. Take 2 tabs the 9th day. Take 2 tabs the 10th day. Take 1 tab the 11th day. Take 1 tab the 12th day.        This chart was dictated using voice recognition software/Dragon. Despite best efforts to proofread, errors can occur which can change the meaning. Any change was purely unintentional.    Orvil Feil, PA-C 11/10/16 2155    Dionne Bucy, MD 11/10/16 [phone]

## 2016-11-10 NOTE — ED Notes (Signed)
Pt. States was exposed to poison Lajoyce Cornersvy this past Saturday, stated he was treated same day here with benadryl and steroids.  Pt. States continued discomfort.  Pt. Has redness to face and scalp..  Pt. States burning sensation to face.  Pt. States no difficulty breathing or swallowing..Marland Kitchen

## 2016-11-24 DIAGNOSIS — H9190 Unspecified hearing loss, unspecified ear: Secondary | ICD-10-CM | POA: Insufficient documentation

## 2016-12-28 DIAGNOSIS — H903 Sensorineural hearing loss, bilateral: Secondary | ICD-10-CM | POA: Diagnosis not present

## 2017-04-25 ENCOUNTER — Emergency Department
Admission: EM | Admit: 2017-04-25 | Discharge: 2017-04-26 | Disposition: A | Discharge status: Home / Self Care | Payer: Medicaid Other | Attending: Emergency Medicine | Admitting: Emergency Medicine

## 2017-04-25 ENCOUNTER — Other Ambulatory Visit: Payer: Self-pay

## 2017-04-25 ENCOUNTER — Encounter: Payer: Self-pay | Admitting: Emergency Medicine

## 2017-04-25 DIAGNOSIS — Z23 Encounter for immunization: Secondary | ICD-10-CM | POA: Diagnosis not present

## 2017-04-25 DIAGNOSIS — J449 Chronic obstructive pulmonary disease, unspecified: Secondary | ICD-10-CM | POA: Diagnosis not present

## 2017-04-25 DIAGNOSIS — Z79899 Other long term (current) drug therapy: Secondary | ICD-10-CM | POA: Diagnosis not present

## 2017-04-25 DIAGNOSIS — Y929 Unspecified place or not applicable: Secondary | ICD-10-CM | POA: Insufficient documentation

## 2017-04-25 DIAGNOSIS — Y998 Other external cause status: Secondary | ICD-10-CM | POA: Diagnosis not present

## 2017-04-25 DIAGNOSIS — S61012A Laceration without foreign body of left thumb without damage to nail, initial encounter: Secondary | ICD-10-CM | POA: Insufficient documentation

## 2017-04-25 DIAGNOSIS — W230XXA Caught, crushed, jammed, or pinched between moving objects, initial encounter: Secondary | ICD-10-CM | POA: Diagnosis not present

## 2017-04-25 DIAGNOSIS — I129 Hypertensive chronic kidney disease with stage 1 through stage 4 chronic kidney disease, or unspecified chronic kidney disease: Secondary | ICD-10-CM | POA: Insufficient documentation

## 2017-04-25 DIAGNOSIS — Y9389 Activity, other specified: Secondary | ICD-10-CM | POA: Diagnosis not present

## 2017-04-25 DIAGNOSIS — N189 Chronic kidney disease, unspecified: Secondary | ICD-10-CM | POA: Diagnosis not present

## 2017-04-25 DIAGNOSIS — F17210 Nicotine dependence, cigarettes, uncomplicated: Secondary | ICD-10-CM | POA: Diagnosis not present

## 2017-04-25 MED ORDER — TETANUS-DIPHTH-ACELL PERTUSSIS 5-2.5-18.5 LF-MCG/0.5 IM SUSP
0.5000 mL | Freq: Once | INTRAMUSCULAR | Status: AC
Start: 2017-04-25 — End: 2017-04-25
  Administered 2017-04-25: 0.5 mL via INTRAMUSCULAR
  Filled 2017-04-25: qty 0.5

## 2017-04-25 MED ORDER — LIDOCAINE HCL (PF) 1 % IJ SOLN
2.0000 mL | Freq: Once | INTRAMUSCULAR | Status: DC
  Filled 2017-04-25: qty 5
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
## 2017-04-25 NOTE — Discharge Instructions (Addendum)
Please follow-up with primary care provider or urgent care facility and 8-10 days for suture removal and Steri-Strip application.  Keep skin clean and dry.  Do not submerge underwater.  Cleanse daily cleanse laceration site daily with alcohol or peroxide.  If any warmth erythema or drainage return to the ED.

## 2017-06-22 ENCOUNTER — Emergency Department: Payer: Medicaid Other

## 2017-06-22 ENCOUNTER — Emergency Department
Admission: EM | Admit: 2017-06-22 | Discharge: 2017-06-22 | Disposition: A | Discharge status: Home / Self Care | Payer: Medicaid Other | Attending: Student in an Organized Health Care Education/Training Program | Admitting: Student in an Organized Health Care Education/Training Program

## 2017-06-22 ENCOUNTER — Encounter: Payer: Self-pay | Admitting: Emergency Medicine

## 2017-06-22 DIAGNOSIS — F17210 Nicotine dependence, cigarettes, uncomplicated: Secondary | ICD-10-CM | POA: Diagnosis not present

## 2017-06-22 DIAGNOSIS — R0602 Shortness of breath: Secondary | ICD-10-CM | POA: Diagnosis not present

## 2017-06-22 DIAGNOSIS — R1084 Generalized abdominal pain: Secondary | ICD-10-CM | POA: Insufficient documentation

## 2017-06-22 DIAGNOSIS — J449 Chronic obstructive pulmonary disease, unspecified: Secondary | ICD-10-CM | POA: Diagnosis not present

## 2017-06-22 DIAGNOSIS — I10 Essential (primary) hypertension: Secondary | ICD-10-CM | POA: Insufficient documentation

## 2017-06-22 DIAGNOSIS — R109 Unspecified abdominal pain: Secondary | ICD-10-CM

## 2017-06-22 DIAGNOSIS — Z87442 Personal history of urinary calculi: Secondary | ICD-10-CM | POA: Diagnosis not present

## 2017-06-22 DIAGNOSIS — Z79899 Other long term (current) drug therapy: Secondary | ICD-10-CM | POA: Diagnosis not present

## 2017-06-22 LAB — TROPONIN I: Troponin I: <0.03 ng/mL (ref <0.03)

## 2017-06-22 LAB — URINALYSIS, COMPLETE (UACMP) WITH MICROSCOPIC
BILIRUBIN URINE: NEGATIVE
Bacteria, UA: NONE SEEN
Glucose, UA: NEGATIVE mg/dL
HGB URINE DIPSTICK: NEGATIVE
Ketones, ur: NEGATIVE mg/dL
Leukocytes, UA: NEGATIVE
Nitrite: NEGATIVE
PH: 6 (ref 5.0–8.0)
Protein, ur: NEGATIVE mg/dL
Specific Gravity, Urine: 1.002 — ABNORMAL LOW (ref 1.005–1.030)
WBC, UA: NONE SEEN WBC/hpf (ref 0–5)

## 2017-06-22 LAB — FIBRIN DERIVATIVES D-DIMER (ARMC ONLY): Fibrin derivatives D-dimer (ARMC): 1959.22 ng/mL (FEU) — ABNORMAL HIGH (ref 0.00–499.00)

## 2017-06-22 LAB — BASIC METABOLIC PANEL
Anion gap: 9 (ref 5–15)
BUN: 19 mg/dL (ref 6–20)
CO2: 26 mmol/L (ref 22–32)
Calcium: 9 mg/dL (ref 8.9–10.3)
Chloride: 105 mmol/L (ref 101–111)
Creatinine, Ser: 1.06 mg/dL (ref 0.61–1.24)
GFR calc Af Amer: >60 mL/min (ref >60)
GFR calc non Af Amer: >60 mL/min (ref >60)
GLUCOSE: 71 mg/dL (ref 65–99)
POTASSIUM: 4.6 mmol/L (ref 3.5–5.1)
SODIUM: 140 mmol/L (ref 135–145)

## 2017-06-22 LAB — CBC
HEMATOCRIT: 49.7 % (ref 40.0–52.0)
Hemoglobin: 16.6 g/dL (ref 13.0–18.0)
MCH: 31 pg (ref 26.0–34.0)
MCHC: 33.5 g/dL (ref 32.0–36.0)
MCV: 92.6 fL (ref 80.0–100.0)
Platelets: 235 10*3/uL (ref 150–440)
RBC: 5.37 MIL/uL (ref 4.40–5.90)
RDW: 14.9 % — AB (ref 11.5–14.5)
WBC: 10.8 10*3/uL — AB (ref 3.8–10.6)

## 2017-06-22 MED ORDER — FENTANYL CITRATE (PF) 100 MCG/2ML IJ SOLN
50.0000 ug | INTRAMUSCULAR | Status: DC | PRN
  Administered 2017-06-22: 50 ug via INTRAVENOUS
  Filled 2017-06-22: qty 2

## 2017-06-22 MED ORDER — HYDROCODONE-ACETAMINOPHEN 5-325 MG PO TABS: 1.0000 | ORAL_TABLET | ORAL | 0 refills | Status: DC | PRN

## 2017-06-22 MED ORDER — HYDROCODONE-ACETAMINOPHEN 5-325 MG PO TABS
1.0000 | ORAL_TABLET | Freq: Once | ORAL | Status: AC
  Administered 2017-06-22: 1 via ORAL
  Filled 2017-06-22: qty 1

## 2017-06-22 MED ORDER — MORPHINE SULFATE (PF) 4 MG/ML IV SOLN
8.0000 mg | INTRAVENOUS | Status: DC | PRN
  Administered 2017-06-22: 8 mg via INTRAVENOUS
  Filled 2017-06-22: qty 2

## 2017-06-22 MED ORDER — IOPAMIDOL (ISOVUE-370) INJECTION 76%
75.0000 mL | Freq: Once | INTRAVENOUS | Status: AC | PRN
  Administered 2017-06-22: 75 mL via INTRAVENOUS

## 2017-06-22 MED ORDER — ONDANSETRON HCL 4 MG/2ML IJ SOLN
4.0000 mg | Freq: Once | INTRAMUSCULAR | Status: AC | PRN
  Administered 2017-06-22: 4 mg via INTRAVENOUS
  Filled 2017-06-22: qty 2

## 2017-06-22 MED ORDER — KETOROLAC TROMETHAMINE 30 MG/ML IJ SOLN
15.0000 mg | Freq: Once | INTRAMUSCULAR | Status: AC
  Administered 2017-06-22: 15 mg via INTRAVENOUS
  Filled 2017-06-22: qty 1

## 2017-06-22 NOTE — ED Notes (Signed)
Pt c/o nausea associated with the pain

## 2017-06-22 NOTE — Discharge Instructions (Addendum)

## 2017-06-22 NOTE — ED Notes (Signed)
Discussed with Dr. Scotty Court pt's chief complaint.  Pt stated the only pain medication that works is dilaudid.  Pt informed he cannot receive dilaudid in triage, but would be able to get fentanyl.  Pt agreeable to fentanyl.  Informed pt CT was ordered.

## 2017-06-22 NOTE — ED Provider Notes (Signed)
[facility] Emergency Department Provider Note    First MD Initiated Contact with Patient 06/22/17 1251     (approximate)  I have reviewed the triage vital signs and the nursing notes.   HISTORY  Chief Complaint Flank Pain    HPI OLDEN KLAUER is a 62 y.o. male with a history of kidney stones presents with chief complaint of left flank pain associated with shortness of breath as well as worsened pain with movement.  Worse when he takes a deep breath.  Has never had pain like this before.  States that same location as his previous kidney stones he has follow-up with urology.  Denies any dysuria.  No fevers.  No cough.  States he has been under quite a bit of stress as he is caring for his 2 elderly and demented parents.  States he is frequently having to lift them and has been under a lot of stress.  States he does have a smoking history.  Denies any chest pressure.  No diaphoresis.  No pain ripping or tearing through to his back.  Past Medical History:  Diagnosis Date  . Arthritis    left hand  . Asthma   . Chronic kidney disease    HAS HAD KIDNEY STONE 2015-- JUST PASSED  . COPD (chronic obstructive pulmonary disease) (HCC)   . Dyspnea   . GERD (gastroesophageal reflux disease)    TAKES TUMS FOR RELIEF  . Hyperlipidemia   . Hypertension    Improved, No longer on meds  . Lower urinary tract symptoms (LUTS)   . Wears dentures    hass full upper plate, not wearing, broken  . Wears hearing aid    Family History  Problem Relation Age of Onset  . Arthritis Mother   . Hypertension Mother   . Diabetes Father   . Hypertension Father    Past Surgical History:  Procedure Laterality Date  . BACK SURGERY    . CATARACT EXTRACTION W/PHACO Left 01/06/2016   Procedure: CATARACT EXTRACTION PHACO AND INTRAOCULAR LENS PLACEMENT (IOC);  Surgeon: Nevada Crane, MD;  Location: [facility];  Service: Ophthalmology;  Laterality: Left;  LEFT  .  COLONOSCOPY WITH PROPOFOL N/A 04/19/2016   Procedure: COLONOSCOPY WITH PROPOFOL;  Surgeon: Scot Jun, MD;  Location: [facility] ENDOSCOPY;  Service: Endoscopy;  Laterality: N/A;  . ESOPHAGOGASTRODUODENOSCOPY (EGD) WITH PROPOFOL N/A 10/24/2015   Procedure: ESOPHAGOGASTRODUODENOSCOPY (EGD) WITH PROPOFOL;  Surgeon: Scot Jun, MD;  Location: [facility] ENDOSCOPY;  Service: Endoscopy;  Laterality: N/A;  . EYE SURGERY    . HERNIA REPAIR     ERRONEOUS UMBILICAL HERNIA   2011  . MYRINGOTOMY WITH TUBE PLACEMENT Bilateral 03/28/2015   Procedure: MYRINGOTOMY WITH TUBE PLACEMENT;  Surgeon: Linus Salmons, MD;  Location: [facility];  Service: ENT;  Laterality: Bilateral;  BUTTERFLY TUBE  . TONSILLECTOMY    . TRANSFORAMINAL LUMBAR INTERBODY FUSION (TLIF) WITH PEDICLE SCREW FIXATION 1 LEVEL Right 06/18/2014   Procedure: TRANSFORAMINAL LUMBAR INTERBODY FUSION (TLIF) WITH PEDICLE SCREW FIXATION 1 LEVEL LUMBAR 5 -SACRAL 1;  Surgeon: Aliene Beams, MD;  Location: [facility]UR[facility];  Service: Neurosurgery;  Laterality: Right;  Right transforaminal lumbar interbody fusion with interbody prosthesis and percutaneous pedicle screws Lumbar 5 to Sacral 1   Patient Active Problem List   Diagnosis Date Noted  . Lumbar herniated disc 06/18/2014  . DDD (degenerative disc disease), lumbosacral 06/02/2014  . Spinal stenosis, lumbar region, with neurogenic claudication 06/02/2014  . Lumbar radiculopathy  06/02/2014  . Airway hyperreactivity 04/19/2014  . Chronic LBP 04/19/2014  . BP (high blood pressure) 04/19/2014  . Temporary cerebral vascular dysfunction 06/06/2013  . Compulsive tobacco user syndrome 05/22/2013  . 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 Lower urinary tract symptoms 12/27/2012      Prior to Admission medications   Medication Sig Start Date End Date Taking? Authorizing Provider  acetaminophen (TYLENOL) 325 MG tablet Take 650 mg by mouth every 6 (six) hours as needed for mild pain  or headache.     [provider]  albuterol (PROVENTIL HFA;VENTOLIN HFA) 108 (90 BASE) MCG/ACT inhaler Inhale 1-2 puffs into the lungs every 6 (six) hours as needed for wheezing or shortness of breath.     [provider]  ALPRAZolam Prudy Feeler) 1 MG tablet Take 1 mg by mouth at bedtime as needed for anxiety.    [provider]  cetirizine (ZYRTEC) 10 MG tablet Take 10 mg by mouth daily.    [provider]  cyclobenzaprine (FLEXERIL) 10 MG tablet Take 10 mg by mouth at bedtime as needed for muscle spasms.    [provider]  Diclofenac Sodium 3 % GEL Place 1 application onto the skin every 12 (twelve) hours as needed. 08/04/16   Schaevitz, Myra Rude, MD  diphenhydrAMINE (BENADRYL) 25 mg capsule Take 1 capsule (25 mg total) by mouth every 6 (six) hours as needed. 11/06/16   Evon Slack, PA-C  fluticasone (FLONASE) 50 MCG/ACT nasal spray Place into both nostrils daily.    [provider]  gabapentin (NEURONTIN) 300 MG capsule Take 600 mg by mouth 3 (three) times daily.    [provider]  HYDROcodone-acetaminophen (NORCO) 5-325 MG tablet Take 1 tablet by mouth every 4 (four) hours as needed for moderate pain. 06/22/17   Willy Eddy, MD  lisinopril (PRINIVIL,ZESTRIL) 20 MG tablet Take 10 mg by mouth daily.    [provider]  Multiple Vitamin (MULTIVITAMIN) tablet Take 1 tablet by mouth daily.    [provider]  omeprazole (PRILOSEC) 40 MG capsule Take 40 mg by mouth daily.    [provider]  predniSONE (STERAPRED UNI-PAK 21 TAB) 10 MG (21) TBPK tablet Take 6 tabs the the 1st day. Take 6 tabs the the 2nd day. Take 5 tabs the the 3rd day. Take 5 tabs the 4th day. Take 4 tabs the the 5th day.Take 4 tabs the the 6th day.Take 3 tabs the 7th day.Take 3 tabs the 8th day. Take 2 tabs the 9th day. Take 2 tabs the 10th day. Take 1 tab the 11th day. Take 1 tab the 12th day. 11/10/16   Orvil Feil, PA-C    tamsulosin (FLOMAX) 0.4 MG CAPS capsule Take 0.8 mg by mouth daily.  02/07/14   [provider]    Allergies Patient has no known allergies.    Social History Social History   Tobacco Use  . Smoking status: Current Every Day Smoker    Packs/day: 1.50    Years: 40.00    Pack years: 60.00    Types: Cigarettes  . Smokeless tobacco: Never Used  . Tobacco comment: has cut back to 1PPD  Substance Use Topics  . Alcohol use: No    Alcohol/week: 0.0 oz    Comment: ONCE OR TWICE A YR  . Drug use: No    Review of Systems Patient denies headaches, rhinorrhea, blurry vision, numbness, shortness of breath, chest pain, edema, cough, abdominal pain, nausea, vomiting, diarrhea,  dysuria, fevers, rashes or hallucinations unless otherwise stated above in HPI. ____________________________________________   PHYSICAL EXAM:  VITAL SIGNS: Vitals:   06/22/17 1330 06/22/17 1522  BP: 125/79 126/85  Pulse: (!) 49 (!) 47  Resp: 10 15  Temp:    SpO2: 97% 97%    Constitutional: Alert and oriented. uncomfortable appearing but in no acute distress. Eyes: Conjunctivae are normal.  Head: Atraumatic. Nose: No congestion/rhinnorhea. Mouth/Throat: Mucous membranes are moist.   Neck: No stridor. Painless ROM.  Cardiovascular: Normal rate, regular rhythm. Grossly normal heart sounds.  Good peripheral circulation. Respiratory: Normal respiratory effort.  No retractions. Lungs CTAB. Gastrointestinal: Soft and nontender. No distention. No abdominal bruits. + left CVA tenderness. Genitourinary: deferred Musculoskeletal: No lower extremity tenderness nor edema.  No joint effusions. Neurologic:  Normal speech and language. No gross focal neurologic deficits are appreciated. No facial droop Skin:  Skin is warm, dry and intact. No rash noted. Psychiatric: Mood and affect are normal. Speech and behavior are normal.  ____________________________________________   LABS (all labs ordered are  listed, but only abnormal results are displayed)  Results for orders placed or performed during the hospital encounter of 06/22/17 (from the past 24 hour(s))  Basic metabolic panel     Status: None   Collection Time: 06/22/17 10:51 AM  Result Value Ref Range   Sodium 140 135 - 145 mmol/L   Potassium 4.6 3.5 - 5.1 mmol/L   Chloride 105 101 - 111 mmol/L   CO2 26 22 - 32 mmol/L   Glucose, Bld 71 65 - 99 mg/dL   BUN 19 6 - 20 mg/dL   Creatinine, Ser 1.61 0.61 - 1.24 mg/dL   Calcium 9.0 8.9 - 09.6 mg/dL   GFR calc non Af Amer >60 >60 mL/min   GFR calc Af Amer >60 >60 mL/min   Anion gap 9 5 - 15  CBC     Status: Abnormal   Collection Time: 06/22/17 10:51 AM  Result Value Ref Range   WBC 10.8 (H) 3.8 - 10.6 K/uL   RBC 5.37 4.40 - 5.90 MIL/uL   Hemoglobin 16.6 13.0 - 18.0 g/dL   HCT 04.5 40.9 - 81.1 %   MCV 92.6 80.0 - 100.0 fL   MCH 31.0 26.0 - 34.0 pg   MCHC 33.5 32.0 - 36.0 g/dL   RDW 91.4 (H) 78.2 - 95.6 %   Platelets 235 150 - 440 K/uL  Troponin I     Status: None   Collection Time: 06/22/17 10:51 AM  Result Value Ref Range   Troponin I <0.03 <0.03 ng/mL  Urinalysis, Complete w Microscopic     Status: Abnormal   Collection Time: 06/22/17 11:02 AM  Result Value Ref Range   Color, Urine STRAW (A) YELLOW   APPearance CLEAR (A) CLEAR   Specific Gravity, Urine 1.002 (L) 1.005 - 1.030   pH 6.0 5.0 - 8.0   Glucose, UA NEGATIVE NEGATIVE mg/dL   Hgb urine dipstick NEGATIVE NEGATIVE   Bilirubin Urine NEGATIVE NEGATIVE   Ketones, ur NEGATIVE NEGATIVE mg/dL   Protein, ur NEGATIVE NEGATIVE mg/dL   Nitrite NEGATIVE NEGATIVE   Leukocytes, UA NEGATIVE NEGATIVE   WBC, UA NONE SEEN 0 - 5 WBC/hpf   Bacteria, UA NONE SEEN NONE SEEN   Squamous Epithelial / LPF 0-5 0 - 5  Fibrin derivatives D-Dimer ([facility] only)     Status: Abnormal   Collection Time: 06/22/17  1:07 PM  Result Value Ref Range   Fibrin derivatives D-dimer [facility])  1,959.22 (H) 0.00 - 499.00 ng/mL (FEU)  Troponin I     Status:  None   Collection Time: 06/22/17  2:17 PM  Result Value Ref Range   Troponin I <0.03 <0.03 ng/mL   ____________________________________________  EKG My review and personal interpretation at Time: 13:42   Indication: flank pain  Rate: 55  Rhythm: sinus Axis: normal Other: normal intervals, no stemi, nonspecific st abn ____________________________________________  RADIOLOGY  I personally reviewed all radiographic images ordered to evaluate for the above acute complaints and reviewed radiology reports and findings.  These findings were personally discussed with the patient.  Please see medical record for radiology report.  ____________________________________________   PROCEDURES  Procedure(s) performed:  Procedures    Critical Care performed: no ____________________________________________   INITIAL IMPRESSION / ASSESSMENT AND PLAN / ED COURSE  Pertinent labs & imaging results that were available during my care of the patient were reviewed by me and considered in my medical decision making (see chart for details).  DDX: stone, pyelo, msk strain, AAA, pe, acs, pna, ptx  KASHTEN GOWIN is a 62 y.o. who presents to the ED with was as described above.  She had history of kidney stones and given his flank pain had CT imaging out of triage ordered for concern for acute ureterolithiasis.  CT imaging shows no evidence of hydro-or obstructive uropathy at this point.  Patient's pain seems to be more intrathoracic and given his symptoms will order d-dimer to further risk stratify for PE.  Will provide IV and oral pain medication.  Clinical Course as of Jun 23 1554  Wed Jun 22, 2017  1341 Patient with significantly elevated d-dimer.  Based on his presentation I am clinically concerned for PE therefore CT imaging will be ordered with contrast to evaluate for PE.   [PR]  1507 She is repeat troponin is negative.  Pain seems primarily most possible renal colic.  At this point do believe  patient stable and appropriate for outpatient follow-up.  Have discussed with the patient and available family all diagnostics and treatments performed thus far and all questions were answered to the best of my ability. The patient demonstrates understanding and agreement with plan.    [PR]    Clinical Course User Index [PR] Willy Eddy, MD     As part of my medical decision making, I reviewed the following data within the electronic MEDICAL RECORD NUMBER Nursing notes reviewed and incorporated, Labs reviewed, notes from prior ED visits and Center Controlled Substance Database   ____________________________________________   FINAL CLINICAL IMPRESSION(S) / ED DIAGNOSES  Final diagnoses:  Left flank pain      NEW MEDICATIONS STARTED DURING THIS VISIT:  Discharge Medication List as of 06/22/2017  3:08 PM    START taking these medications   Details  HYDROcodone-acetaminophen (NORCO) 5-325 MG tablet Take 1 tablet by mouth every 4 (four) hours as needed for moderate pain., Starting Wed 06/22/2017, Print         Note:  This document was prepared using Dragon voice recognition software and may include unintentional dictation errors.    Willy Eddy, MD 06/22/17 1556

## 2017-06-22 NOTE — ED Triage Notes (Signed)
Pt reports kidney stone on his left side. Pt states has has them several times before and this feels the same.

## 2017-08-03 DIAGNOSIS — N138 Other obstructive and reflux uropathy: Secondary | ICD-10-CM | POA: Diagnosis not present

## 2017-08-03 DIAGNOSIS — Z125 Encounter for screening for malignant neoplasm of prostate: Secondary | ICD-10-CM | POA: Diagnosis not present

## 2017-08-03 DIAGNOSIS — N401 Enlarged prostate with lower urinary tract symptoms: Secondary | ICD-10-CM | POA: Diagnosis not present

## 2017-08-30 DIAGNOSIS — F411 Generalized anxiety disorder: Secondary | ICD-10-CM | POA: Diagnosis not present

## 2017-09-08 ENCOUNTER — Emergency Department
Admission: EM | Admit: 2017-09-08 | Discharge: 2017-09-08 | Disposition: A | Discharge status: Home / Self Care | Payer: Medicaid Other | Attending: Emergency Medicine | Admitting: Emergency Medicine

## 2017-09-08 ENCOUNTER — Other Ambulatory Visit: Payer: Self-pay

## 2017-09-08 ENCOUNTER — Emergency Department: Payer: Medicaid Other

## 2017-09-08 ENCOUNTER — Encounter: Payer: Self-pay | Admitting: Emergency Medicine

## 2017-09-08 DIAGNOSIS — F17210 Nicotine dependence, cigarettes, uncomplicated: Secondary | ICD-10-CM | POA: Insufficient documentation

## 2017-09-08 DIAGNOSIS — R9431 Abnormal electrocardiogram [ECG] [EKG]: Secondary | ICD-10-CM | POA: Diagnosis not present

## 2017-09-08 DIAGNOSIS — I10 Essential (primary) hypertension: Secondary | ICD-10-CM | POA: Diagnosis not present

## 2017-09-08 DIAGNOSIS — R55 Syncope and collapse: Secondary | ICD-10-CM | POA: Diagnosis not present

## 2017-09-08 DIAGNOSIS — R339 Retention of urine, unspecified: Secondary | ICD-10-CM | POA: Diagnosis present

## 2017-09-08 DIAGNOSIS — N400 Enlarged prostate without lower urinary tract symptoms: Secondary | ICD-10-CM | POA: Diagnosis not present

## 2017-09-08 DIAGNOSIS — Z79899 Other long term (current) drug therapy: Secondary | ICD-10-CM | POA: Insufficient documentation

## 2017-09-08 DIAGNOSIS — N200 Calculus of kidney: Secondary | ICD-10-CM | POA: Diagnosis not present

## 2017-09-08 DIAGNOSIS — J45909 Unspecified asthma, uncomplicated: Secondary | ICD-10-CM | POA: Insufficient documentation

## 2017-09-08 LAB — BASIC METABOLIC PANEL
Anion gap: 8 (ref 5–15)
BUN: 20 mg/dL (ref 8–23)
CALCIUM: 8.9 mg/dL (ref 8.9–10.3)
CHLORIDE: 107 mmol/L (ref 98–111)
CO2: 26 mmol/L (ref 22–32)
CREATININE: 1.15 mg/dL (ref 0.61–1.24)
GFR calc Af Amer: >60 mL/min (ref >60)
Glucose, Bld: 105 mg/dL — ABNORMAL HIGH (ref 70–99)
POTASSIUM: 4.2 mmol/L (ref 3.5–5.1)
Sodium: 141 mmol/L (ref 135–145)

## 2017-09-08 LAB — URINALYSIS, COMPLETE (UACMP) WITH MICROSCOPIC
BACTERIA UA: NONE SEEN
Bilirubin Urine: NEGATIVE
Glucose, UA: NEGATIVE mg/dL
Ketones, ur: NEGATIVE mg/dL
Leukocytes, UA: NEGATIVE
NITRITE: NEGATIVE
Protein, ur: NEGATIVE mg/dL
RBC / HPF: >50 RBC/hpf — ABNORMAL HIGH (ref 0–5)
SPECIFIC GRAVITY, URINE: 1.018 (ref 1.005–1.030)
pH: 6 (ref 5.0–8.0)

## 2017-09-08 LAB — CBC WITH DIFFERENTIAL/PLATELET
Basophils Absolute: 0.1 10*3/uL (ref 0–0.1)
Basophils Relative: 1 %
EOS PCT: 6 %
Eosinophils Absolute: 0.6 10*3/uL (ref 0–0.7)
HCT: 44.5 % (ref 40.0–52.0)
HEMOGLOBIN: 15.4 g/dL (ref 13.0–18.0)
LYMPHS ABS: 3.9 10*3/uL — AB (ref 1.0–3.6)
Lymphocytes Relative: 39 %
MCH: 32.2 pg (ref 26.0–34.0)
MCHC: 34.7 g/dL (ref 32.0–36.0)
MCV: 92.8 fL (ref 80.0–100.0)
Monocytes Absolute: 0.8 10*3/uL (ref 0.2–1.0)
Monocytes Relative: 8 %
NEUTROS ABS: 4.7 10*3/uL (ref 1.4–6.5)
NEUTROS PCT: 46 %
Platelets: 242 10*3/uL (ref 150–440)
RBC: 4.79 MIL/uL (ref 4.40–5.90)
RDW: 13.8 % (ref 11.5–14.5)
WBC: 10 10*3/uL (ref 3.8–10.6)

## 2017-09-08 LAB — TROPONIN I

## 2017-09-08 MED ORDER — HYDROMORPHONE HCL 1 MG/ML IJ SOLN
1.0000 mg | Freq: Once | INTRAMUSCULAR | Status: AC
  Administered 2017-09-08: 1 mg via INTRAVENOUS
  Filled 2017-09-08: qty 1

## 2017-09-08 MED ORDER — MORPHINE SULFATE (PF) 4 MG/ML IV SOLN
4.0000 mg | Freq: Once | INTRAVENOUS | Status: AC
  Administered 2017-09-08: 4 mg via INTRAVENOUS
  Filled 2017-09-08: qty 1

## 2017-09-08 MED ORDER — FENTANYL CITRATE (PF) 100 MCG/2ML IJ SOLN
50.0000 ug | Freq: Once | INTRAMUSCULAR | Status: AC
  Administered 2017-09-08: 50 ug via INTRAVENOUS
  Filled 2017-09-08: qty 2

## 2017-09-08 MED ORDER — SODIUM CHLORIDE 0.9 % IV BOLUS
1000.0000 mL | Freq: Once | INTRAVENOUS | Status: AC
  Administered 2017-09-08: 1000 mL via INTRAVENOUS

## 2017-09-08 MED ORDER — KETOROLAC TROMETHAMINE 30 MG/ML IJ SOLN
30.0000 mg | Freq: Once | INTRAMUSCULAR | Status: AC
  Administered 2017-09-08: 30 mg via INTRAVENOUS
  Filled 2017-09-08: qty 1

## 2017-09-08 MED ORDER — OXYCODONE-ACETAMINOPHEN 5-325 MG PO TABS: 1.0000 | ORAL_TABLET | Freq: Three times a day (TID) | ORAL | 0 refills | Status: DC | PRN

## 2017-09-08 MED ORDER — ONDANSETRON HCL 4 MG PO TABS: 4.0000 mg | ORAL_TABLET | Freq: Three times a day (TID) | ORAL | 0 refills | Status: DC | PRN

## 2017-09-08 MED ORDER — KETOROLAC TROMETHAMINE 10 MG PO TABS: 10.0000 mg | ORAL_TABLET | Freq: Three times a day (TID) | ORAL | 0 refills | Status: DC | PRN

## 2017-09-08 NOTE — ED Notes (Signed)
Patient taken to room 19 for further evaluation after fall. Dr. Derrill KayGoodman aware of fall.

## 2017-09-08 NOTE — ED Triage Notes (Signed)
Patient to ER for c/o urinary retention today. Denies any history of the same, but knows he has h/o enlarged prostate. States he was supposed to have procedure for prostate approx one month ago, but was unable to d/t death in the family. States he has only urinated 2-3 times today with very Coyle amounts. Patient states he feels like he needs to urinate every 5 mins.

## 2017-09-08 NOTE — Discharge Instructions (Signed)
Please seek medical attention for any high fevers, chest pain, shortness of breath, change in behavior, persistent vomiting, bloody stool or any other new or concerning symptoms.  

## 2017-09-08 NOTE — ED Notes (Signed)
Provider made aware pt reporting no change in pain after meds

## 2017-09-08 NOTE — ED Notes (Signed)
Patient had stepped outside while waiting in lobby for exam room for MD to evaluate. While outside, patient was attempting to re-enter ER lobby and had syncopal episode. Another patient's family witnessed event and states patient was about to walk into lobby and fell sideways. Patient was sitting on butt on pavement with legs straight out in front of him upon this RN's arrival to scene. Patient states "I just had a bad pain hit me.". Patient alert and oriented at this time. Does not appear to have any injuries from fall. Patient denies any pain other than original pain complaint.

## 2017-09-08 NOTE — ED Provider Notes (Signed)
[facility] Emergency Department Provider Note    ____________________________________________   I have reviewed the triage vital signs and the nursing notes.   HISTORY  Chief Complaint Urinary Retention   History limited by: Not Limited   HPI Ryan [name] is a 62 y.o. male who presents to the emergency department today because of concerns for difficulty with urination.  The patient states that time started today.  Feels like he has to urinate but then cannot.  He does have some discomfort in the lower abdominal area with this.  He describes it as feeling like his bladder is about to burst.  Patient states he does have a history of prostate disorder. The patient denies any fevers.  Per medical record review patient has a history of GERD.  Past Medical History:  Diagnosis Date  . Arthritis    left hand  . Asthma   . Chronic kidney disease    HAS HAD KIDNEY STONE 2015-- JUST PASSED  . COPD (chronic obstructive pulmonary disease) (HCC)   . Dyspnea   . GERD (gastroesophageal reflux disease)    TAKES TUMS FOR RELIEF  . Hyperlipidemia   . Hypertension    Improved, No longer on meds  . Lower urinary tract symptoms (LUTS)   . Wears dentures    hass full upper plate, not wearing, broken  . Wears hearing aid     Patient Active Problem List   Diagnosis Date Noted  . Lumbar herniated disc 06/18/2014  . DDD (degenerative disc disease), lumbosacral 06/02/2014  . Spinal stenosis, lumbar region, with neurogenic claudication 06/02/2014  . Lumbar radiculopathy 06/02/2014  . Airway hyperreactivity 04/19/2014  . Chronic LBP 04/19/2014  . BP (high blood pressure) 04/19/2014  . Temporary cerebral vascular dysfunction 06/06/2013  . Compulsive tobacco user syndrome 05/22/2013  . 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 Lower urinary tract symptoms 12/27/2012    Past Surgical History:  Procedure Laterality Date  . BACK SURGERY    . CATARACT  EXTRACTION W/PHACO Left 01/06/2016   Procedure: CATARACT EXTRACTION PHACO AND INTRAOCULAR LENS PLACEMENT (IOC);  Surgeon: Nevada CraneBradley Mark King, MD;  [facility];  Service: Ophthalmology;  Laterality: Left;  LEFT  . COLONOSCOPY WITH PROPOFOL N/A 04/19/2016   Procedure: COLONOSCOPY WITH PROPOFOL;  Surgeon: Scot Junobert T Elliott, MD;  [facility];  Service: Endoscopy;  Laterality: N/A;  . ESOPHAGOGASTRODUODENOSCOPY (EGD) WITH PROPOFOL N/A 10/24/2015   Procedure: ESOPHAGOGASTRODUODENOSCOPY (EGD) WITH PROPOFOL;  Surgeon: Scot Junobert T Elliott, MD;  [facility];  Service: Endoscopy;  Laterality: N/A;  . EYE SURGERY    . HERNIA REPAIR     ERRONEOUS UMBILICAL HERNIA   2011  . MYRINGOTOMY WITH TUBE PLACEMENT Bilateral 03/28/2015   Procedure: MYRINGOTOMY WITH TUBE PLACEMENT;  Surgeon: Linus Salmonshapman McQueen, MD;  Location[facility];  Service: ENT;  Laterality: Bilateral;  BUTTERFLY TUBE  . TONSILLECTOMY    . TRANSFORAMINAL LUMBAR INTERBODY FUSION (TLIF) WITH PEDICLE SCREW FIXATION 1 LEVEL Right 06/18/2014   Procedure: TRANSFORAMINAL LUMBAR INTERBODY FUSION (TLIF) WITH PEDICLE SCREW FIXATION 1 LEVEL LUMBAR 5 -SACRAL 1;  Surgeon: Aliene Beamsandy Kritzer, MD;  Location: [facility];  Service: Neurosurgery;  Laterality: Right;  Right transforaminal lumbar interbody fusion with interbody prosthesis and percutaneous pedicle screws Lumbar 5 to Sacral 1    Prior to Admission medications   Medication Sig Start Date End Date Taking? Authorizing Provider  acetaminophen (TYLENOL) 325 MG tablet Take 650 mg by mouth every 6 (six) hours  as needed for mild pain or headache.     [provider]  albuterol (PROVENTIL HFA;VENTOLIN HFA) 108 (90 BASE) MCG/ACT inhaler Inhale 1-2 puffs into the lungs every 6 (six) hours as needed for wheezing or shortness of breath.     [provider]  ALPRAZolam Prudy Feeler) 1 MG tablet Take 1 mg by mouth at bedtime as needed for anxiety.    [provider]  cetirizine (ZYRTEC) 10 MG tablet Take 10 mg by mouth daily.    [provider]  cyclobenzaprine (FLEXERIL) 10 MG tablet Take 10 mg by mouth at bedtime as needed for muscle spasms.    [provider]  Diclofenac Sodium 3 % GEL Place 1 application onto the skin every 12 (twelve) hours as needed. 08/04/16   Schaevitz, Myra Rude, MD  diphenhydrAMINE (BENADRYL) 25 mg capsule Take 1 capsule (25 mg total) by mouth every 6 (six) hours as needed. 11/06/16   Evon Slack, PA-C  fluticasone (FLONASE) 50 MCG/ACT nasal spray Place into both nostrils daily.    [provider]  gabapentin (NEURONTIN) 300 MG capsule Take 600 mg by mouth 3 (three) times daily.    [provider]  HYDROcodone-acetaminophen (NORCO) 5-325 MG tablet Take 1 tablet by mouth every 4 (four) hours as needed for moderate pain. 06/22/17   Willy Eddy, MD  lisinopril (PRINIVIL,ZESTRIL) 20 MG tablet Take 10 mg by mouth daily.    [provider]  Multiple Vitamin (MULTIVITAMIN) tablet Take 1 tablet by mouth daily.    [provider]  omeprazole (PRILOSEC) 40 MG capsule Take 40 mg by mouth daily.    [provider]  predniSONE (STERAPRED UNI-PAK 21 TAB) 10 MG (21) TBPK tablet Take 6 tabs the the 1st day. Take 6 tabs the the 2nd day. Take 5 tabs the the 3rd day. Take 5 tabs the 4th day. Take 4 tabs the the 5th day.Take 4 tabs the the 6th day.Take 3 tabs the 7th day.Take 3 tabs the 8th day. Take 2 tabs the 9th day. Take 2 tabs the 10th day. Take 1 tab the 11th day. Take 1 tab the 12th day. 11/10/16   Orvil Feil, PA-C  tamsulosin (FLOMAX) 0.4 MG CAPS capsule Take 0.8 mg by mouth daily.  02/07/14   [provider]    Allergies Patient has no known allergies.  Family History  Problem Relation Age of Onset  . Arthritis Mother   . Hypertension Mother   . Diabetes Father   . Hypertension Father     Social History Social History   Tobacco Use   . Smoking status: Current Every Day Smoker    Packs/day: 1.50    Years: 40.00    Pack years: 60.00    Types: Cigarettes  . Smokeless tobacco: Never Used  . Tobacco comment: has cut back to 1PPD  Substance Use Topics  . Alcohol use: No    Alcohol/week: 0.0 standard drinks    Comment: ONCE OR TWICE A YR  . Drug use: No    Review of Systems Constitutional: No fever/chills Eyes: No visual changes. ENT: No sore throat. Cardiovascular: Denies chest pain. Respiratory: Denies shortness of breath. Gastrointestinal: Positive for lower abdominal pain. Genitourinary: Positive for difficulty with urination. Musculoskeletal: Negative for back pain. Skin: Negative for rash. Neurological: Negative for headaches, focal weakness or numbness.  ____________________________________________   PHYSICAL EXAM:  VITAL SIGNS: ED Triage Vitals  Enc Vitals Group     BP 09/08/17 0258 130/80  Pulse Rate 09/08/17 0258 66     Resp 09/08/17 0258 20     Temp 09/08/17 0258 97.6 F (36.4 C)     Temp Source 09/08/17 0258 Oral     SpO2 09/08/17 0258 97 %     Weight 09/08/17 0301 175 lb (79.4 kg)     Height 09/08/17 0301 5\' 8"  (1.727 m)     Head Circumference --      Peak Flow --      Pain Score 09/08/17 0301 5   Constitutional: Alert and oriented.  Eyes: Conjunctivae are normal.  ENT      Head: Normocephalic and atraumatic.      Nose: No congestion/rhinnorhea.      Mouth/Throat: Mucous membranes are moist.      Neck: No stridor. Hematological/Lymphatic/Immunilogical: No cervical lymphadenopathy. Cardiovascular: Normal rate, regular rhythm.  No murmurs, rubs, or gallops.  Respiratory: Normal respiratory effort without tachypnea nor retractions. Breath sounds are clear and equal bilaterally. No wheezes/rales/rhonchi. Gastrointestinal: Soft and non tender. No rebound. No guarding.  Genitourinary: Deferred Musculoskeletal: Normal range of motion in all extremities. No lower extremity  edema. Neurologic:  Normal speech and language. No gross focal neurologic deficits are appreciated.  Skin:  Skin is warm, dry and intact. No rash noted. Psychiatric: Mood and affect are normal. Speech and behavior are normal. Patient exhibits appropriate insight and judgment.  ____________________________________________    LABS (pertinent positives/negatives)  CBC wnl except lumph # 3.9 BMP wnl except glu 105 UA large urine dipstick, > 50 RBC  ____________________________________________   EKG  I, Phineas SemenGraydon Chyler Creely, attending physician, personally viewed and interpreted this EKG  EKG Time: 0514 Rate: 76 Rhythm: normal sinus rhythm Axis: left axis deviation Intervals: qtc 411 QRS: narrow ST changes: no st elevation Impression: abnormal ekg   ____________________________________________    RADIOLOGY  CT renal Positive for 3 mm left ureteral stone  ____________________________________________   PROCEDURES  Procedures  ____________________________________________   INITIAL IMPRESSION / ASSESSMENT AND PLAN / ED COURSE  Pertinent labs & imaging results that were available during my care of the patient were reviewed by me and considered in my medical decision making (see chart for details).   Patient presented to the emergency department today because of concerns for urinary retention.  Patient however did not have significant amount of urine on bladder scan.  Did have significant blood in his urine.  CT renal stone was ordered which did show a 3 mm left ureteral stone.  Discussed this finding with the patient.   ____________________________________________   FINAL CLINICAL IMPRESSION(S) / ED DIAGNOSES  Final diagnoses:  Kidney stone     Note: This dictation was prepared with Dragon dictation. Any transcriptional errors that result from this process are unintentional     Phineas SemenGoodman, Maxson Oddo, MD 09/08/17 [phone]

## 2017-09-08 NOTE — ED Notes (Signed)
Patient observed walking around outside of ER lobby in no acute distress.

## 2017-09-08 NOTE — ED Notes (Signed)
Pt to ct at this time.

## 2017-09-14 DIAGNOSIS — F4320 Adjustment disorder, unspecified: Secondary | ICD-10-CM | POA: Insufficient documentation

## 2017-09-14 DIAGNOSIS — Z0000 Encounter for general adult medical examination without abnormal findings: Secondary | ICD-10-CM | POA: Diagnosis not present

## 2017-09-14 DIAGNOSIS — I10 Essential (primary) hypertension: Secondary | ICD-10-CM | POA: Diagnosis not present

## 2017-09-21 DIAGNOSIS — F411 Generalized anxiety disorder: Secondary | ICD-10-CM | POA: Diagnosis not present

## 2017-10-12 DIAGNOSIS — N138 Other obstructive and reflux uropathy: Secondary | ICD-10-CM | POA: Diagnosis not present

## 2017-10-12 DIAGNOSIS — N401 Enlarged prostate with lower urinary tract symptoms: Secondary | ICD-10-CM | POA: Diagnosis not present

## 2017-10-19 ENCOUNTER — Encounter: Payer: Self-pay | Admitting: Emergency Medicine

## 2017-10-19 ENCOUNTER — Other Ambulatory Visit: Payer: Self-pay

## 2017-10-19 ENCOUNTER — Emergency Department: Payer: Medicaid Other

## 2017-10-19 ENCOUNTER — Emergency Department
Admission: EM | Admit: 2017-10-19 | Discharge: 2017-10-19 | Discharge status: Against Medical Advice | Payer: Medicaid Other | Attending: Emergency Medicine | Admitting: Emergency Medicine

## 2017-10-19 DIAGNOSIS — R109 Unspecified abdominal pain: Secondary | ICD-10-CM | POA: Diagnosis not present

## 2017-10-19 DIAGNOSIS — Z79899 Other long term (current) drug therapy: Secondary | ICD-10-CM | POA: Diagnosis not present

## 2017-10-19 DIAGNOSIS — F17210 Nicotine dependence, cigarettes, uncomplicated: Secondary | ICD-10-CM | POA: Insufficient documentation

## 2017-10-19 DIAGNOSIS — J45909 Unspecified asthma, uncomplicated: Secondary | ICD-10-CM | POA: Insufficient documentation

## 2017-10-19 DIAGNOSIS — I10 Essential (primary) hypertension: Secondary | ICD-10-CM | POA: Insufficient documentation

## 2017-10-19 DIAGNOSIS — N200 Calculus of kidney: Secondary | ICD-10-CM | POA: Diagnosis not present

## 2017-10-19 DIAGNOSIS — K859 Acute pancreatitis without necrosis or infection, unspecified: Secondary | ICD-10-CM | POA: Insufficient documentation

## 2017-10-19 DIAGNOSIS — R1084 Generalized abdominal pain: Secondary | ICD-10-CM | POA: Diagnosis present

## 2017-10-19 LAB — URINALYSIS, COMPLETE (UACMP) WITH MICROSCOPIC
BACTERIA UA: NONE SEEN
Bilirubin Urine: NEGATIVE
Glucose, UA: NEGATIVE mg/dL
Hgb urine dipstick: NEGATIVE
Ketones, ur: NEGATIVE mg/dL
Leukocytes, UA: NEGATIVE
Nitrite: NEGATIVE
PROTEIN: NEGATIVE mg/dL
Specific Gravity, Urine: 1.019 (ref 1.005–1.030)
pH: 5 (ref 5.0–8.0)

## 2017-10-19 LAB — HEPATIC FUNCTION PANEL
ALBUMIN: 4.2 g/dL (ref 3.5–5.0)
ALT: 17 U/L (ref 0–44)
AST: 18 U/L (ref 15–41)
Alkaline Phosphatase: 104 U/L (ref 38–126)
Bilirubin, Direct: 0.1 mg/dL (ref 0.0–0.2)
Indirect Bilirubin: 0.2 mg/dL — ABNORMAL LOW (ref 0.3–0.9)
TOTAL PROTEIN: 7 g/dL (ref 6.5–8.1)
Total Bilirubin: 0.3 mg/dL (ref 0.3–1.2)

## 2017-10-19 LAB — BASIC METABOLIC PANEL
ANION GAP: 10 (ref 5–15)
BUN: 22 mg/dL (ref 8–23)
CHLORIDE: 107 mmol/L (ref 98–111)
CO2: 23 mmol/L (ref 22–32)
Calcium: 9.5 mg/dL (ref 8.9–10.3)
Creatinine, Ser: 0.95 mg/dL (ref 0.61–1.24)
GFR calc Af Amer: >60 mL/min (ref >60)
GFR calc non Af Amer: >60 mL/min (ref >60)
GLUCOSE: 103 mg/dL — AB (ref 70–99)
Potassium: 4.9 mmol/L (ref 3.5–5.1)
Sodium: 140 mmol/L (ref 135–145)

## 2017-10-19 LAB — CBC
HEMATOCRIT: 46.3 % (ref 40.0–52.0)
HEMOGLOBIN: 16 g/dL (ref 13.0–18.0)
MCH: 31.9 pg (ref 26.0–34.0)
MCHC: 34.6 g/dL (ref 32.0–36.0)
MCV: 92.1 fL (ref 80.0–100.0)
Platelets: 231 10*3/uL (ref 150–440)
RBC: 5.02 MIL/uL (ref 4.40–5.90)
RDW: 13.9 % (ref 11.5–14.5)
WBC: 10.4 10*3/uL (ref 3.8–10.6)

## 2017-10-19 LAB — LIPASE, BLOOD: Lipase: 77 U/L — ABNORMAL HIGH (ref 11–51)

## 2017-10-19 MED ORDER — TAMSULOSIN HCL 0.4 MG PO CAPS
0.4000 mg | ORAL_CAPSULE | Freq: Once | ORAL | Status: DC
  Filled 2017-10-19: qty 1

## 2017-10-19 MED ORDER — KETOROLAC TROMETHAMINE 30 MG/ML IJ SOLN
15.0000 mg | Freq: Once | INTRAMUSCULAR | Status: AC
  Administered 2017-10-19: 15 mg via INTRAVENOUS
  Filled 2017-10-19: qty 1

## 2017-10-19 MED ORDER — OXYCODONE-ACETAMINOPHEN 5-325 MG PO TABS
1.0000 | ORAL_TABLET | ORAL | Status: DC | PRN
  Administered 2017-10-19: 1 via ORAL
  Filled 2017-10-19: qty 1

## 2017-10-19 MED ORDER — ONDANSETRON HCL 4 MG/2ML IJ SOLN
4.0000 mg | Freq: Once | INTRAMUSCULAR | Status: AC
  Administered 2017-10-19: 4 mg via INTRAVENOUS
  Filled 2017-10-19: qty 2

## 2017-10-19 MED ORDER — MORPHINE SULFATE (PF) 4 MG/ML IV SOLN
4.0000 mg | Freq: Once | INTRAVENOUS | Status: AC
  Administered 2017-10-19: 4 mg via INTRAVENOUS
  Filled 2017-10-19: qty 1

## 2017-10-19 NOTE — ED Notes (Signed)
Pt frustrated with MD verbalization that he would not be receiving dilaudid today in the ED. Pt reports if he is not receiving dilaudid he wants to leave. IV removed. Pt refused repeat vitals and left with wife. PT is ambulatory and in NAD at this time.

## 2017-10-19 NOTE — ED Triage Notes (Signed)
Patient reports bilateral flank pain, worse on right. States he was told he has a "handful of kidney stones" but does not remember on which side. Reports worsening pain over the last few days. History of kidney stones.

## 2017-10-19 NOTE — Discharge Instructions (Addendum)
You have been seen in the Emergency Department (ED) for abdominal pain.  Your evaluation did not identify a clear cause of your symptoms but was generally reassuring. ° °Abdominal pain has many possible causes. Some aren't serious and get better on their own in a few days. Others need more testing and treatment. If your pain continues or gets worse, you need to be rechecked and may need more tests to find out what is wrong. You may need surgery to correct the problem.  ° °Follow up with your doctor in 12-24 hours if you are still having abdominal pain. Otherwise follow up in 1-3 days for a re-check ° °Don't ignore new symptoms, such as fever, nausea and vomiting, new or worsening abdominal pain, urination problems, bloody diarrhea or bloody stools, black tarry stools, uncontrollable nausea and vomiting, and dizziness. These may be signs of a more serious problem. If you develop any of these you should be seen by your doctor immediately or return to the ED. ° ° °How can you care for yourself at home?  °Rest until you feel better.  °To prevent dehydration, drink plenty of fluids, enough so that your urine is light yellow or clear like water. Choose water and other caffeine-free clear liquids until you feel better. If you have kidney, heart, or liver disease and have to limit fluids, talk with your doctor before you increase the amount of fluids you drink.  °If your stomach is upset, eat mild foods, such as rice, dry toast or crackers, bananas, and applesauce. Try eating several Mertens meals instead of two or three large ones.  °Wait until 48 hours after all symptoms have gone away before you have spicy foods, alcohol, and drinks that contain caffeine.  °Do not eat foods that are high in fat.  °Avoid anti-inflammatory medicines such as aspirin, ibuprofen (Advil, Motrin), and naproxen (Aleve). These can cause stomach upset. Talk to your doctor if you take daily aspirin for another health problem. ° °When should you call  for help?  °Call 911 anytime you think you may need emergency care. For example, call if:  °You passed out (lost consciousness).  °You pass maroon or very bloody stools.  °You vomit blood or what looks like coffee grounds.  °You have new, severe belly pain. ° °Call your doctor now or seek immediate medical care if:  °Your pain gets worse, especially if it becomes focused in one area of your belly.  °You have a new or higher fever.  °Your stools are black and look like tar, or they have streaks of blood.  °You have unexpected vaginal bleeding.  °You have symptoms of a urinary tract infection. These may include:  °Pain when you urinate.  °Urinating more often than usual.  °Blood in your urine. °You are dizzy or lightheaded, or you feel like you may faint. °Watch closely for changes in your health, and be sure to contact your doctor if:  °You are not getting better after 1 day (24 hours). ° ° °

## 2017-10-19 NOTE — ED Provider Notes (Addendum)
[facility] Emergency Department Provider Note  ____________________________________________  Time seen: Approximately 4:50 PM  I have reviewed the triage vital signs and the nursing notes.   HISTORY  Chief Complaint Flank Pain   HPI Ryan [name] is a 62 y.o. male with history of the several kidney stones, GERD, hypertension, hyperlipidemia, spinal stenosis who presents for evaluation of right flank pain. Patient reports 4 days of constant sharp colicky right flank pain associated with nausea. He reports that he feels exactly like prior kidney stones. He was hoping he would pass on his own however the pain is not getting any better. Currently he is complaining of 8 out of 10 pain, no abdominal pain, no dysuria or fever, no vomiting, no diarrhea, no chest pain or shortness of breath.  Past Medical History:  Diagnosis Date  . Arthritis    left hand  . Asthma   . Chronic kidney disease    HAS HAD KIDNEY STONE 2015-- JUST PASSED  . COPD (chronic obstructive pulmonary disease) (HCC)   . Dyspnea   . GERD (gastroesophageal reflux disease)    TAKES TUMS FOR RELIEF  . Hyperlipidemia   . Hypertension    Improved, No longer on meds  . Lower urinary tract symptoms (LUTS)   . Wears dentures    hass full upper plate, not wearing, broken  . Wears hearing aid     Patient Active Problem List   Diagnosis Date Noted  . Lumbar herniated disc 06/18/2014  . DDD (degenerative disc disease), lumbosacral 06/02/2014  . Spinal stenosis, lumbar region, with neurogenic claudication 06/02/2014  . Lumbar radiculopathy 06/02/2014  . Airway hyperreactivity 04/19/2014  . Chronic LBP 04/19/2014  . BP (high blood pressure) 04/19/2014  . Temporary cerebral vascular dysfunction 06/06/2013  . Compulsive tobacco user syndrome 05/22/2013  . 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 Lower urinary tract symptoms 12/27/2012    Past Surgical History:  Procedure  Laterality Date  . BACK SURGERY    . CATARACT EXTRACTION W/PHACO Left 01/06/2016   Procedure: CATARACT EXTRACTION PHACO AND INTRAOCULAR LENS PLACEMENT (IOC);  Surgeon: Nevada Crane, MD;  Location: [facility];  Service: Ophthalmology;  Laterality: Left;  LEFT  . COLONOSCOPY WITH PROPOFOL N/A 04/19/2016   Procedure: COLONOSCOPY WITH PROPOFOL;  Surgeon: Scot Jun, MD;  Location: [facility] ENDOSCOPY;  Service: Endoscopy;  Laterality: N/A;  . ESOPHAGOGASTRODUODENOSCOPY (EGD) WITH PROPOFOL N/A 10/24/2015   Procedure: ESOPHAGOGASTRODUODENOSCOPY (EGD) WITH PROPOFOL;  Surgeon: Scot Jun, MD;  Location: [facility] ENDOSCOPY;  Service: Endoscopy;  Laterality: N/A;  . EYE SURGERY    . HERNIA REPAIR     ERRONEOUS UMBILICAL HERNIA   2011  . MYRINGOTOMY WITH TUBE PLACEMENT Bilateral 03/28/2015   Procedure: MYRINGOTOMY WITH TUBE PLACEMENT;  Surgeon: Linus Salmons, MD;  Location: [facility];  Service: ENT;  Laterality: Bilateral;  BUTTERFLY TUBE  . TONSILLECTOMY    . TRANSFORAMINAL LUMBAR INTERBODY FUSION (TLIF) WITH PEDICLE SCREW FIXATION 1 LEVEL Right 06/18/2014   Procedure: TRANSFORAMINAL LUMBAR INTERBODY FUSION (TLIF) WITH PEDICLE SCREW FIXATION 1 LEVEL LUMBAR 5 -SACRAL 1;  Surgeon: Aliene Beams, MD;  Location: [facility] NEURO ORS;  Service: Neurosurgery;  Laterality: Right;  Right transforaminal lumbar interbody fusion with interbody prosthesis and percutaneous pedicle screws Lumbar 5 to Sacral 1    Prior to Admission medications   Medication Sig Start Date End Date Taking? Authorizing Provider  acetaminophen (TYLENOL) 325 MG tablet Take 650 mg by mouth every 6 (six)  hours as needed for mild pain or headache.     [provider]  albuterol (PROVENTIL HFA;VENTOLIN HFA) 108 (90 BASE) MCG/ACT inhaler Inhale 1-2 puffs into the lungs every 6 (six) hours as needed for wheezing or shortness of breath.     [provider]  ALPRAZolam Prudy Feeler(XANAX) 1 MG tablet Take 1 mg by mouth at  bedtime as needed for anxiety.    [provider]  cetirizine (ZYRTEC) 10 MG tablet Take 10 mg by mouth daily.    [provider]  cyclobenzaprine (FLEXERIL) 10 MG tablet Take 10 mg by mouth at bedtime as needed for muscle spasms.    [provider]  Diclofenac Sodium 3 % GEL Place 1 application onto the skin every 12 (twelve) hours as needed. 08/04/16   Schaevitz, Myra Rudeavid Matthew, MD  diphenhydrAMINE (BENADRYL) 25 mg capsule Take 1 capsule (25 mg total) by mouth every 6 (six) hours as needed. 11/06/16   Evon SlackGaines, Thomas C, PA-C  fluticasone (FLONASE) 50 MCG/ACT nasal spray Place into both nostrils daily.    [provider]  gabapentin (NEURONTIN) 300 MG capsule Take 600 mg by mouth 3 (three) times daily.    [provider]  HYDROcodone-acetaminophen (NORCO) 5-325 MG tablet Take 1 tablet by mouth every 4 (four) hours as needed for moderate pain. 06/22/17   Willy Eddyobinson, Patrick, MD  ketorolac (TORADOL) 10 MG tablet Take 1 tablet (10 mg total) by mouth every 8 (eight) hours as needed for severe pain. 09/08/17   Phineas SemenGoodman, Graydon, MD  lisinopril (PRINIVIL,ZESTRIL) 20 MG tablet Take 10 mg by mouth daily.    [provider]  Multiple Vitamin (MULTIVITAMIN) tablet Take 1 tablet by mouth daily.    [provider]  omeprazole (PRILOSEC) 40 MG capsule Take 40 mg by mouth daily.    [provider]  ondansetron (ZOFRAN) 4 MG tablet Take 1 tablet (4 mg total) by mouth every 8 (eight) hours as needed for nausea or vomiting. 09/08/17   Phineas SemenGoodman, Graydon, MD  oxyCODONE-acetaminophen (PERCOCET) 5-325 MG tablet Take 1 tablet by mouth every 8 (eight) hours as needed. 09/08/17   Emily FilbertWilliams, Jonathan E, MD  predniSONE (STERAPRED UNI-PAK 21 TAB) 10 MG (21) TBPK tablet Take 6 tabs the the 1st day. Take 6 tabs the the 2nd day. Take 5 tabs the the 3rd day. Take 5 tabs the 4th day. Take 4 tabs the the 5th day.Take 4 tabs the the 6th day.Take 3 tabs the 7th day.Take 3 tabs  the 8th day. Take 2 tabs the 9th day. Take 2 tabs the 10th day. Take 1 tab the 11th day. Take 1 tab the 12th day. 11/10/16   Orvil FeilWoods, Jaclyn M, PA-C  tamsulosin (FLOMAX) 0.4 MG CAPS capsule Take 0.8 mg by mouth daily.  02/07/14   [provider]    Allergies Patient has no known allergies.  Family History  Problem Relation Age of Onset  . Arthritis Mother   . Hypertension Mother   . Diabetes Father   . Hypertension Father     Social History Social History   Tobacco Use  . Smoking status: Current Every Day Smoker    Packs/day: 1.50    Years: 40.00    Pack years: 60.00    Types: Cigarettes  . Smokeless tobacco: Never Used  . Tobacco comment: has cut back to 1PPD  Substance Use Topics  . Alcohol use: No    Alcohol/week: 0.0 standard drinks    Comment: ONCE OR TWICE A YR  .  Drug use: No    Review of Systems  Constitutional: Negative for fever. Eyes: Negative for visual changes. ENT: Negative for sore throat. Neck: No neck pain  Cardiovascular: Negative for chest pain. Respiratory: Negative for shortness of breath. Gastrointestinal: Negative for abdominal pain, vomiting or diarrhea. + nausea Genitourinary: Negative for dysuria. + R flank pain Musculoskeletal: Negative for back pain. Skin: Negative for rash. Neurological: Negative for headaches, weakness or numbness. Psych: No SI or HI  ____________________________________________   PHYSICAL EXAM:  VITAL SIGNS: ED Triage Vitals  Enc Vitals Group     BP 10/19/17 1332 (!) 142/74     Pulse Rate 10/19/17 1332 64     Resp 10/19/17 1332 18     Temp 10/19/17 1332 98.1 F (36.7 C)     Temp Source 10/19/17 1332 Oral     SpO2 10/19/17 1332 100 %     Weight 10/19/17 1333 175 lb (79.4 kg)     Height 10/19/17 1333 5\' 7"  (1.702 m)     Head Circumference --      Peak Flow --      Pain Score 10/19/17 1333 8     Pain Loc --      Pain Edu? --      Excl. in GC? --     Constitutional: Alert and oriented. Well  appearing and in no apparent distress. HEENT:      Head: Normocephalic and atraumatic.         Eyes: Conjunctivae are normal. Sclera is non-icteric.       Mouth/Throat: Mucous membranes are moist.       Neck: Supple with no signs of meningismus. Cardiovascular: Regular rate and rhythm. No murmurs, gallops, or rubs. 2+ symmetrical distal pulses are present in all extremities. No JVD. Respiratory: Normal respiratory effort. Lungs are clear to auscultation bilaterally. No wheezes, crackles, or rhonchi.  Gastrointestinal: Soft, non tender, and non distended with positive bowel sounds. No rebound or guarding. Genitourinary: R CVA tenderness. Musculoskeletal: Nontender with normal range of motion in all extremities. No edema, cyanosis, or erythema of extremities. Neurologic: Normal speech and language. Face is symmetric. Moving all extremities. No gross focal neurologic deficits are appreciated. Skin: Skin is warm, dry and intact. No rash noted. Psychiatric: Mood and affect are normal. Speech and behavior are normal.  ____________________________________________   LABS (all labs ordered are listed, but only abnormal results are displayed)  Labs Reviewed  BASIC METABOLIC PANEL - Abnormal; Notable for the following components:      Result Value   Glucose, Bld 103 (*)    All other components within normal limits  URINALYSIS, COMPLETE (UACMP) WITH MICROSCOPIC - Abnormal; Notable for the following components:   Color, Urine YELLOW (*)    APPearance CLEAR (*)    All other components within normal limits  HEPATIC FUNCTION PANEL - Abnormal; Notable for the following components:   Indirect Bilirubin 0.2 (*)    All other components within normal limits  LIPASE, BLOOD - Abnormal; Notable for the following components:   Lipase 77 (*)    All other components within normal limits  CBC   ____________________________________________  EKG  none   ____________________________________________  RADIOLOGY  I have personally reviewed the images performed during this visit and I agree with the Radiologist's read.   Interpretation by Radiologist:  Ct Renal Stone Study  Result Date: 10/19/2017 CLINICAL DATA:  BILATERAL flank pain worse on RIGHT, history of kidney stones, worsening pain over past few days EXAM:  CT ABDOMEN AND PELVIS WITHOUT CONTRAST TECHNIQUE: Multidetector CT imaging of the abdomen and pelvis was performed following the standard protocol without IV contrast. Sagittal and coronal MPR images reconstructed from axial data set. Oral contrast not administered for this indication. COMPARISON:  09/08/2017 FINDINGS: Lower chest: Lung bases clear Hepatobiliary: Gallbladder and liver normal appearance Pancreas: Normal appearance Spleen: Normal appearance Adrenals/Urinary Tract: Adrenal glands normal appearance. Tiny nonobstructing LEFT renal calculi without ureteral calcification. No renal mass, hydronephrosis or hydroureter. Bladder unremarkable. Stomach/Bowel: Retrocecal appendix extending cranially, lateral to the inferior aspect of the RIGHT lobe of the liver. Mild scattered colonic diverticulosis without definite evidence of diverticulitis. Stomach decompressed. Bowel loops otherwise normal appearance. Vascular/Lymphatic: Atherosclerotic calcifications aorta and iliac arteries without aneurysm. No adenopathy. Reproductive: Prostatic enlargement, gland measuring 5.4 x 3.8 cm image 70. Somewhat prominent seminal vesicles, unchanged. Other: No free air or free fluid. No hernia or acute inflammatory process. Musculoskeletal: Advanced degenerative disc disease changes L4-L5 and L5-S1 with mild retrolisthesis at L4-L5 and anterolisthesis at L3-L4. BILATERAL pedicle screws and disc prosthesis at L5-S1 from prior posterior fusion. IMPRESSION: Nonobstructing tiny LEFT renal calculi. Colonic diverticulosis without evidence of diverticulitis. Prostatic  enlargement. Advanced degenerative disc disease changes of the lower lumbar spine with prior L5-S1 posterior fusion. No acute intra-abdominal or intrapelvic abnormalities. Electronically Signed   By: Ulyses Southward M.D.   On: 10/19/2017 16:57      ____________________________________________   PROCEDURES  Procedure(s) performed: None Procedures Critical Care performed:  None ____________________________________________   INITIAL IMPRESSION / ASSESSMENT AND PLAN / ED COURSE   62 y.o. male with history of the several kidney stones, GERD, hypertension, hyperlipidemia, spinal stenosis who presents for evaluation of right flank pain.patient is well-appearing, in no distress, has normal vital signs, right CVA tenderness, abdomen is soft with no pulsatile mass, no tenderness, patient has strong pulses in all extremities. Differential diagnoses including kidney stone versus pyelonephritis versus gallbladder disease versus pancreatitis versus peptic ulcer disease versus GERD versus vascular pathology such as AAA/dissection/mesenteric ischemia. We'll give IV morphine, toradol, and zofran. UA and CT pending  Clinical Course as of Oct 19 2241  Wed Oct 19, 2017  1802 .CT renal negative. Recommended CT with contrast to rule out vascular pathology but patient refused. Patient upset that his pain has not been addressed and I explained to him that he has received 1 percocet, IV morphine, and IV toradol and I wanted to hold off further meds until we can find out what is causing his pain. Patient said "I have been here since 1 PM and if you are not going to give me dilaudid, then give me my discharge papers". Review of Epic shows that patient has been seen several times in the past with flank pain and negative CT scans. In one prior visit, imaging and labs were all negative and patient continued to complain of severe pain after 2 rounds of IV dilaudid. I am slightly concerned at this time for drug seeking behavior  and explained to the patient that I was happy to care for his pain as long as I have a indication for it which is why I wanted to pursue CT with contrast but patient is adamant about being discharged. Idiscussed risks of leaving the hospital without further evaluation which includes undiagnosed mesenteric ischemia, AAA, dissection including severe morbidity and mortality. Patient understands the risks.   [CV]    Clinical Course User Index [CV] Nita Sickle, MD   _________________________ 10:42 PM on 10/19/2017 -----------------------------------------  When patient left AMA, LFTs and lipase were pending and reviewing his chart now shows that patient lipase is elevated concerning for pancreatitis. Per Ranson criteria, patient low risk for complications.  I attempted to contact patient to discuss this diagnosis and left him a message.    As part of my medical decision making, I reviewed the following data within the electronic MEDICAL RECORD NUMBER Nursing notes reviewed and incorporated, Labs reviewed , Old chart reviewed, Radiograph reviewed , Notes from prior ED visits and  Controlled Substance Database    Pertinent labs & imaging results that were available during my care of the patient were reviewed by me and considered in my medical decision making (see chart for details).    ____________________________________________   FINAL CLINICAL IMPRESSION(S) / ED DIAGNOSES  Final diagnoses:  Right flank pain  Acute pancreatitis, unspecified complication status, unspecified pancreatitis type      NEW MEDICATIONS STARTED DURING THIS VISIT:  ED Discharge Orders    None       Note:  This document was prepared using Dragon voice recognition software and may include unintentional dictation errors.    Don Perking, [place], MD 10/19/17 Adline Peals, [place], MD 10/19/17 [phone]

## 2017-11-18 DIAGNOSIS — R61 Generalized hyperhidrosis: Secondary | ICD-10-CM | POA: Diagnosis not present

## 2017-11-18 DIAGNOSIS — R946 Abnormal results of thyroid function studies: Secondary | ICD-10-CM | POA: Diagnosis not present

## 2017-11-18 DIAGNOSIS — Z23 Encounter for immunization: Secondary | ICD-10-CM | POA: Diagnosis not present

## 2017-11-19 DIAGNOSIS — R946 Abnormal results of thyroid function studies: Secondary | ICD-10-CM | POA: Insufficient documentation

## 2017-11-19 DIAGNOSIS — R61 Generalized hyperhidrosis: Secondary | ICD-10-CM | POA: Diagnosis not present

## 2017-11-22 ENCOUNTER — Encounter: Payer: Self-pay | Admitting: Emergency Medicine

## 2017-11-22 ENCOUNTER — Emergency Department
Admission: EM | Admit: 2017-11-22 | Discharge: 2017-11-22 | Disposition: A | Discharge status: Home / Self Care | Payer: Medicaid Other | Attending: Emergency Medicine | Admitting: Emergency Medicine

## 2017-11-22 ENCOUNTER — Emergency Department: Payer: Medicaid Other

## 2017-11-22 ENCOUNTER — Other Ambulatory Visit: Payer: Self-pay

## 2017-11-22 DIAGNOSIS — G8929 Other chronic pain: Secondary | ICD-10-CM | POA: Diagnosis not present

## 2017-11-22 DIAGNOSIS — F17210 Nicotine dependence, cigarettes, uncomplicated: Secondary | ICD-10-CM | POA: Insufficient documentation

## 2017-11-22 DIAGNOSIS — Z79899 Other long term (current) drug therapy: Secondary | ICD-10-CM | POA: Diagnosis not present

## 2017-11-22 DIAGNOSIS — M5441 Lumbago with sciatica, right side: Secondary | ICD-10-CM | POA: Diagnosis not present

## 2017-11-22 DIAGNOSIS — J45909 Unspecified asthma, uncomplicated: Secondary | ICD-10-CM | POA: Insufficient documentation

## 2017-11-22 DIAGNOSIS — M545 Low back pain: Secondary | ICD-10-CM | POA: Diagnosis not present

## 2017-11-22 DIAGNOSIS — I10 Essential (primary) hypertension: Secondary | ICD-10-CM | POA: Insufficient documentation

## 2017-11-22 MED ORDER — PREDNISONE 10 MG PO TABS: ORAL_TABLET | ORAL | 0 refills | Status: DC

## 2017-11-22 MED ORDER — OXYCODONE-ACETAMINOPHEN 5-325 MG PO TABS: 1.0000 | ORAL_TABLET | ORAL | 0 refills | Status: DC | PRN

## 2017-11-22 MED ORDER — KETOROLAC TROMETHAMINE 10 MG PO TABS: 10.0000 mg | ORAL_TABLET | Freq: Four times a day (QID) | ORAL | 0 refills | Status: DC | PRN

## 2017-11-22 MED ORDER — LIDOCAINE 5 % EX PTCH
1.0000 | MEDICATED_PATCH | CUTANEOUS | Status: DC
  Administered 2017-11-22: 1 via TRANSDERMAL
  Filled 2017-11-22: qty 1

## 2017-11-22 MED ORDER — METHYLPREDNISOLONE SODIUM SUCC 125 MG IJ SOLR
125.0000 mg | Freq: Once | INTRAMUSCULAR | Status: AC
  Administered 2017-11-22: 125 mg via INTRAMUSCULAR
  Filled 2017-11-22: qty 2

## 2017-11-22 MED ORDER — KETOROLAC TROMETHAMINE 30 MG/ML IJ SOLN
30.0000 mg | Freq: Once | INTRAMUSCULAR | Status: AC
  Administered 2017-11-22: 30 mg via INTRAMUSCULAR
  Filled 2017-11-22: qty 1

## 2017-11-22 MED ORDER — OXYCODONE-ACETAMINOPHEN 5-325 MG PO TABS
1.0000 | ORAL_TABLET | Freq: Once | ORAL | Status: AC
  Administered 2017-11-22: 1 via ORAL
  Filled 2017-11-22: qty 1

## 2017-11-22 MED ORDER — LIDOCAINE 5 % EX PTCH: 1.0000 | MEDICATED_PATCH | CUTANEOUS | 0 refills | Status: DC

## 2017-11-22 MED ORDER — CYCLOBENZAPRINE HCL 5 MG PO TABS: ORAL_TABLET | ORAL | 0 refills | Status: DC

## 2017-11-22 NOTE — ED Provider Notes (Signed)
[facility] Emergency Department Provider Note  ____________________________________________  Time seen: Approximately 10:40 AM  I have reviewed the triage vital signs and the nursing notes.   HISTORY  Chief Complaint Back Pain    HPI Ryan [name] is a 62 y.o. male presents emergency department for evaluation of low back pain for 4 days.  Patient states that pain begins in low back and radiates into right leg.  Pain shoots down the back and the front of his leg.  He is able to move all of his toes.  He has had difficulty sleeping over the weekend.  Patient had back surgery several years ago.  No bowel or bladder dysfunction or saddle anesthesias.  No fever or IV drug use.  No nausea, vomiting, abdominal pain, weakness.  Past Medical History:  Diagnosis Date  . Arthritis    left hand  . Asthma   . Chronic kidney disease    HAS HAD KIDNEY STONE 2015-- JUST PASSED  . COPD (chronic obstructive pulmonary disease) (HCC)   . Dyspnea   . GERD (gastroesophageal reflux disease)    TAKES TUMS FOR RELIEF  . Hyperlipidemia   . Hypertension    Improved, No longer on meds  . Lower urinary tract symptoms (LUTS)   . Wears dentures    hass full upper plate, not wearing, broken  . Wears hearing aid     Patient Active Problem List   Diagnosis Date Noted  . Lumbar herniated disc 06/18/2014  . DDD (degenerative disc disease), lumbosacral 06/02/2014  . Spinal stenosis, lumbar region, with neurogenic claudication 06/02/2014  . Lumbar radiculopathy 06/02/2014  . Airway hyperreactivity 04/19/2014  . Chronic LBP 04/19/2014  . BP (high blood pressure) 04/19/2014  . Temporary cerebral vascular dysfunction 06/06/2013  . Compulsive tobacco user syndrome 05/22/2013  . 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 Lower urinary tract symptoms 12/27/2012    Past Surgical History:  Procedure Laterality Date  . BACK SURGERY    . CATARACT EXTRACTION W/PHACO Left  01/06/2016   Procedure: CATARACT EXTRACTION PHACO AND INTRAOCULAR LENS PLACEMENT (IOC);  Surgeon: Nevada Crane, MD;  Location: [facility];  Service: Ophthalmology;  Laterality: Left;  LEFT  . COLONOSCOPY WITH PROPOFOL N/A 04/19/2016   Procedure: COLONOSCOPY WITH PROPOFOL;  Surgeon: Scot Jun, MD;  Location: [facility] ENDOSCOPY;  Service: Endoscopy;  Laterality: N/A;  . ESOPHAGOGASTRODUODENOSCOPY (EGD) WITH PROPOFOL N/A 10/24/2015   Procedure: ESOPHAGOGASTRODUODENOSCOPY (EGD) WITH PROPOFOL;  Surgeon: Scot Jun, MD;  Location: [facility] ENDOSCOPY;  Service: Endoscopy;  Laterality: N/A;  . EYE SURGERY    . HERNIA REPAIR     ERRONEOUS UMBILICAL HERNIA   2011  . MYRINGOTOMY WITH TUBE PLACEMENT Bilateral 03/28/2015   Procedure: MYRINGOTOMY WITH TUBE PLACEMENT;  Surgeon: Linus Salmons, MD;  Location: [facility];  Service: ENT;  Laterality: Bilateral;  BUTTERFLY TUBE  . TONSILLECTOMY    . TRANSFORAMINAL LUMBAR INTERBODY FUSION (TLIF) WITH PEDICLE SCREW FIXATION 1 LEVEL Right 06/18/2014   Procedure: TRANSFORAMINAL LUMBAR INTERBODY FUSION (TLIF) WITH PEDICLE SCREW FIXATION 1 LEVEL LUMBAR 5 -SACRAL 1;  Surgeon: Aliene Beams, MD;  Location: [facility] NEURO ORS;  Service: Neurosurgery;  Laterality: Right;  Right transforaminal lumbar interbody fusion with interbody prosthesis and percutaneous pedicle screws Lumbar 5 to Sacral 1    Prior to Admission medications   Medication Sig Start Date End Date Taking? Authorizing Provider  acetaminophen (TYLENOL) 325 MG tablet Take 650 mg by mouth every 6 (six) hours  as needed for mild pain or headache.     [provider]  albuterol (PROVENTIL HFA;VENTOLIN HFA) 108 (90 BASE) MCG/ACT inhaler Inhale 1-2 puffs into the lungs every 6 (six) hours as needed for wheezing or shortness of breath.     [provider]  ALPRAZolam Prudy Feeler) 1 MG tablet Take 1 mg by mouth at bedtime as needed for anxiety.    [provider]  cetirizine  (ZYRTEC) 10 MG tablet Take 10 mg by mouth daily.    [provider]  cyclobenzaprine (FLEXERIL) 5 MG tablet Take 1-2 tablets 3 times daily as needed 11/22/17   Enid Derry, PA-C  Diclofenac Sodium 3 % GEL Place 1 application onto the skin every 12 (twelve) hours as needed. 08/04/16   Schaevitz, Myra Rude, MD  diphenhydrAMINE (BENADRYL) 25 mg capsule Take 1 capsule (25 mg total) by mouth every 6 (six) hours as needed. 11/06/16   Evon Slack, PA-C  fluticasone (FLONASE) 50 MCG/ACT nasal spray Place into both nostrils daily.    [provider]  gabapentin (NEURONTIN) 300 MG capsule Take 600 mg by mouth 3 (three) times daily.    [provider]  HYDROcodone-acetaminophen (NORCO) 5-325 MG tablet Take 1 tablet by mouth every 4 (four) hours as needed for moderate pain. 06/22/17   Willy Eddy, MD  ketorolac (TORADOL) 10 MG tablet Take 1 tablet (10 mg total) by mouth every 6 (six) hours as needed. 11/22/17   Enid Derry, PA-C  lidocaine (LIDODERM) 5 % Place 1 patch onto the skin daily. Remove & Discard patch within 12 hours or as directed by MD 11/22/17   Enid Derry, PA-C  lisinopril (PRINIVIL,ZESTRIL) 20 MG tablet Take 10 mg by mouth daily.    [provider]  Multiple Vitamin (MULTIVITAMIN) tablet Take 1 tablet by mouth daily.    [provider]  omeprazole (PRILOSEC) 40 MG capsule Take 40 mg by mouth daily.    [provider]  ondansetron (ZOFRAN) 4 MG tablet Take 1 tablet (4 mg total) by mouth every 8 (eight) hours as needed for nausea or vomiting. 09/08/17   Phineas Semen, MD  oxyCODONE-acetaminophen (PERCOCET) 5-325 MG tablet Take 1 tablet by mouth every 4 (four) hours as needed for severe pain. 11/22/17 11/22/18  Enid Derry, PA-C  predniSONE (DELTASONE) 10 MG tablet Take 6 tablets on day 1, take 5 tablets on day 2, take 4 tablets on day 3, take 3 tablets on day 4, take 2 tablets on day 5, take 1 tablet on day 6 11/22/17    Enid Derry, PA-C  tamsulosin (FLOMAX) 0.4 MG CAPS capsule Take 0.8 mg by mouth daily.  02/07/14   [provider]    Allergies Patient has no known allergies.  Family History  Problem Relation Age of Onset  . Arthritis Mother   . Hypertension Mother   . Diabetes Father   . Hypertension Father     Social History Social History   Tobacco Use  . Smoking status: Current Every Day Smoker    Packs/day: 1.50    Years: 40.00    Pack years: 60.00    Types: Cigarettes  . Smokeless tobacco: Never Used  . Tobacco comment: has cut back to 1PPD  Substance Use Topics  . Alcohol use: No    Alcohol/week: 0.0 standard drinks    Comment: ONCE OR TWICE A YR  . Drug use: No     Review of Systems  Constitutional: No fever/chills Respiratory: No SOB. Gastrointestinal:  No abdominal pain.  No nausea, no vomiting.  Musculoskeletal: Positive for back pain. Skin: Negative for rash, abrasions, lacerations, ecchymosis. Neurological: Negative for headaches, numbness or tingling   ____________________________________________   PHYSICAL EXAM:  VITAL SIGNS: ED Triage Vitals  Enc Vitals Group     BP 11/22/17 0915 133/76     Pulse Rate 11/22/17 0915 86     Resp 11/22/17 0915 20     Temp 11/22/17 0915 97.8 F (36.6 C)     Temp Source 11/22/17 0915 Oral     SpO2 11/22/17 0915 97 %     Weight 11/22/17 0915 175 lb (79.4 kg)     Height 11/22/17 0915 5\' 7"  (1.702 m)     Head Circumference --      Peak Flow --      Pain Score 11/22/17 0933 7     Pain Loc --      Pain Edu? --      Excl. in GC? --      Constitutional: Alert and oriented. Well appearing and in no acute distress. Eyes: Conjunctivae are normal. PERRL. EOMI. Head: Atraumatic. ENT:      Ears:      Nose: No congestion/rhinnorhea.      Mouth/Throat: Mucous membranes are moist.  Neck: No stridor. Cardiovascular: Normal rate, regular rhythm.  Good peripheral circulation. Respiratory: Normal respiratory effort  without tachypnea or retractions. Lungs CTAB. Good air entry to the bases with no decreased or absent breath sounds. Gastrointestinal: Bowel sounds 4 quadrants. Soft and nontender to palpation. No guarding or rigidity. No palpable masses. No distention.  Musculoskeletal: Full range of motion to all extremities. No gross deformities appreciated.  Tenderness to palpation over inferior lumbar spine.  No tenderness to palpation over lumbar paraspinal muscles.  Strength equal in lower extremities bilaterally.  No foot drop.  Normal gait. Neurologic:  Normal speech and language. No gross focal neurologic deficits are appreciated.  Skin:  Skin is warm, dry and intact. No rash noted. Psychiatric: Mood and affect are normal. Speech and behavior are normal. Patient exhibits appropriate insight and judgement.   ____________________________________________   LABS (all labs ordered are listed, but only abnormal results are displayed)  Labs Reviewed - No data to display ____________________________________________  EKG   ____________________________________________  RADIOLOGY Lexine Baton, personally viewed and evaluated these images (plain radiographs) as part of my medical decision making, as well as reviewing the written report by the radiologist.  Dg Lumbar Spine 2-3 Views  Result Date: 11/22/2017 CLINICAL DATA:  Low back pain with radiculopathy. EXAM: LUMBAR SPINE - 2-3 VIEW COMPARISON:  CT abdomen pelvis 10/19/2017 FINDINGS: Mild anterolisthesis L4-5. Remaining alignment normal. Negative for fracture Advanced disc degeneration L4-5 with disc space narrowing, endplate sclerosis and spurring similar to the prior study. Pedicle screw and interbody fusion L5-S1. Question pseudarthrosis. 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  Mild anterolisthesis L3-4 Pedicle screw interbody fusion L5-S1.  Question pseudarthrosis. Electronically Signed   By: Marlan Palau M.D.   On: 11/22/2017 11:01     ____________________________________________    PROCEDURES  Procedure(s) performed:    Procedures    Medications  methylPREDNISolone sodium succinate (SOLU-MEDROL) 125 mg/2 mL injection 125 mg (125 mg Intramuscular Given 11/22/17 1102)  ketorolac (TORADOL) 30 MG/ML injection 30 mg (30 mg Intramuscular Given 11/22/17 1102)  oxyCODONE-acetaminophen (PERCOCET/ROXICET) 5-325 MG per tablet 1 tablet (1 tablet Oral Given 11/22/17 1158)     ____________________________________________   INITIAL IMPRESSION / ASSESSMENT AND PLAN / ED COURSE  Pertinent labs & imaging results that were available during my care of the patient were reviewed by me and considered in my medical decision making (see chart for details).  Review of the [place] CSRS was performed in accordance of the [facility] prior to dispensing any controlled drugs.   Patient presents emergency department for evaluation of acute on chronic back pain with radiculopathy.  X-ray consistent with chronic changes.  Pain improved with Percocet, Solu-Medrol, Toradol.  Patient will be discharged home with prescriptions for prednisone, Flexeril, Toradol, a short course of Percocet. Patient is to follow up with orthopedics and pain clinic as directed. Patient is given ED precautions to return to the ED for any worsening or new symptoms.     ____________________________________________  FINAL CLINICAL IMPRESSION(S) / ED DIAGNOSES  Final diagnoses:  Chronic right-sided low back pain with right-sided sciatica      NEW MEDICATIONS STARTED DURING THIS VISIT:  ED Discharge Orders         Ordered    oxyCODONE-acetaminophen (PERCOCET) 5-325 MG tablet  Every 4 hours PRN     11/22/17 1145    ketorolac (TORADOL) 10 MG tablet  Every 6 hours PRN     11/22/17 1145    predniSONE (DELTASONE) 10 MG tablet     11/22/17 1145    cyclobenzaprine (FLEXERIL) 5 MG tablet     11/22/17 1145    lidocaine (LIDODERM) 5 %  Every 24 hours     11/22/17 1145               This chart was dictated using voice recognition software/Dragon. Despite best efforts to proofread, errors can occur which can change the meaning. Any change was purely unintentional.    Enid Derry, PA-C 11/22/17 1511    Arnaldo Natal, MD 11/22/17 [phone]

## 2017-11-22 NOTE — ED Triage Notes (Signed)
Patient with history of surgery in back and states since Friday, he has had worsening back pain and things he may have a pinched nerve. Patient ambulatory to triage with steady gait.

## 2017-11-27 ENCOUNTER — Emergency Department
Admission: EM | Admit: 2017-11-27 | Discharge: 2017-11-27 | Disposition: A | Discharge status: Home / Self Care | Payer: Medicaid Other | Attending: Emergency Medicine | Admitting: Emergency Medicine

## 2017-11-27 ENCOUNTER — Other Ambulatory Visit: Payer: Self-pay

## 2017-11-27 DIAGNOSIS — M5441 Lumbago with sciatica, right side: Secondary | ICD-10-CM | POA: Insufficient documentation

## 2017-11-27 DIAGNOSIS — N189 Chronic kidney disease, unspecified: Secondary | ICD-10-CM | POA: Insufficient documentation

## 2017-11-27 DIAGNOSIS — Z79899 Other long term (current) drug therapy: Secondary | ICD-10-CM | POA: Diagnosis not present

## 2017-11-27 DIAGNOSIS — I129 Hypertensive chronic kidney disease with stage 1 through stage 4 chronic kidney disease, or unspecified chronic kidney disease: Secondary | ICD-10-CM | POA: Diagnosis not present

## 2017-11-27 DIAGNOSIS — F17210 Nicotine dependence, cigarettes, uncomplicated: Secondary | ICD-10-CM | POA: Insufficient documentation

## 2017-11-27 DIAGNOSIS — J45909 Unspecified asthma, uncomplicated: Secondary | ICD-10-CM | POA: Diagnosis not present

## 2017-11-27 DIAGNOSIS — M545 Low back pain: Secondary | ICD-10-CM | POA: Diagnosis present

## 2017-11-27 MED ORDER — PREDNISONE 20 MG PO TABS
60.0000 mg | ORAL_TABLET | Freq: Once | ORAL | Status: AC
  Administered 2017-11-27: 60 mg via ORAL
  Filled 2017-11-27: qty 3

## 2017-11-27 MED ORDER — OXYCODONE-ACETAMINOPHEN 5-325 MG PO TABS: 1.0000 | ORAL_TABLET | Freq: Four times a day (QID) | ORAL | 0 refills | Status: DC | PRN

## 2017-11-27 MED ORDER — CYCLOBENZAPRINE HCL 10 MG PO TABS: 10.0000 mg | ORAL_TABLET | Freq: Three times a day (TID) | ORAL | 0 refills | Status: AC | PRN

## 2017-11-27 MED ORDER — PREDNISONE 20 MG PO TABS: 60.0000 mg | ORAL_TABLET | Freq: Every day | ORAL | 0 refills | Status: AC

## 2017-11-27 MED ORDER — CYCLOBENZAPRINE HCL 10 MG PO TABS
10.0000 mg | ORAL_TABLET | Freq: Once | ORAL | Status: AC
  Administered 2017-11-27: 10 mg via ORAL
  Filled 2017-11-27: qty 1

## 2017-11-27 MED ORDER — KETOROLAC TROMETHAMINE 30 MG/ML IJ SOLN
30.0000 mg | Freq: Once | INTRAMUSCULAR | Status: AC
  Administered 2017-11-27: 30 mg via INTRAMUSCULAR
  Filled 2017-11-27: qty 1

## 2017-11-27 MED ORDER — PREDNISONE 20 MG PO TABS: 60.0000 mg | ORAL_TABLET | Freq: Every day | ORAL | 0 refills | Status: DC

## 2017-11-27 MED ORDER — OXYCODONE-ACETAMINOPHEN 5-325 MG PO TABS: 1.0000 | ORAL_TABLET | Freq: Four times a day (QID) | ORAL | 0 refills | Status: AC | PRN

## 2017-11-27 MED ORDER — CYCLOBENZAPRINE HCL 10 MG PO TABS: 10.0000 mg | ORAL_TABLET | Freq: Three times a day (TID) | ORAL | 0 refills | Status: DC | PRN

## 2017-11-27 MED ORDER — OXYCODONE-ACETAMINOPHEN 5-325 MG PO TABS
1.0000 | ORAL_TABLET | Freq: Once | ORAL | Status: AC
  Administered 2017-11-27: 1 via ORAL
  Filled 2017-11-27: qty 1

## 2017-11-27 NOTE — ED Notes (Signed)
Pt given phone at this time to contact wife for transportation due to previous medication administration.

## 2017-11-27 NOTE — ED Triage Notes (Signed)
Pt arrives to ED via POV from home with c/o chronic back pain. Pt reports h/x of back surgery "that never healed right". Pt reports being seen here for the same recently and returns for pain control. Pt states he has tried to follow-up, but states "they are waiting for the paperwork to be sent over". Pt denies any new falls, recent trauma or injury, no urinary changes or c/o's.

## 2017-11-27 NOTE — ED Provider Notes (Addendum)
Signout from Dr. Marisa Severin to reassess Mr. collard who has chronic back pain.  Patient given Percocet here in the emergency department.  Plan is to reassess for pain relief and likely discharged home.  Physical Exam  BP 137/86   Pulse (!) 55   Temp 97.8 F (36.6 C) (Oral)   Resp 17   Ht 5\' 7"  (1.702 m)   Wt 78.9 kg   SpO2 99%   BMI 27.25 kg/m   Physical Exam Patient ambulating without assistance in the room.  No distress noted.  ED Course/Procedures     Procedures  MDM  Patient states that his pain is now tolerable.  Denies any bowel or bladder incontinence.  Does not report weakness or numbness.  Says that he is currently trying to obtain outpatient follow-up with pain control.  Will be discharged per Dr. Marisa Severin with prednisone, oxycodone and Flexeril.  Prescription sent to patient's pharmacy at [facility].       Myrna Blazer, MD 11/27/17 [phone]  Patient requesting medications be sent to local [facility].  However, medications of already been sent to [facility] via [facility].  I will give him enough Percocet to get him to the day and that he may pick up the Percocet tomorrow at [facility].  Also printed the Flexeril and prednisone prescriptions so that he may pick them up today.   Myrna Blazer, MD 11/27/17 [phone]

## 2017-11-27 NOTE — ED Provider Notes (Signed)
[facility] Emergency Department Provider Note ____________________________________________   First MD Initiated Contact with Patient 11/27/17 [phone]     (approximate)  I have reviewed the triage vital signs and the nursing notes.   HISTORY  Chief Complaint Back Pain    HPI Ryan [name] is a 62 y.o. male with PMH as noted below including chronic back pain presents with persistent right lower back pain radiating to the right leg, chronic for months but worse since yesterday when he states he lifted something.  He denies any acute weakness or numbness.  Denies any other specific injuries.  The patient was seen in the ED several days ago and given pain medication, steroid, and muscle relaxant which helped.   Past Medical History:  Diagnosis Date  . Arthritis    left hand  . Asthma   . Chronic kidney disease    HAS HAD KIDNEY STONE 2015-- JUST PASSED  . COPD (chronic obstructive pulmonary disease) (HCC)   . Dyspnea   . GERD (gastroesophageal reflux disease)    TAKES TUMS FOR RELIEF  . Hyperlipidemia   . Hypertension    Improved, No longer on meds  . Lower urinary tract symptoms (LUTS)   . Wears dentures    hass full upper plate, not wearing, broken  . Wears hearing aid     Patient Active Problem List   Diagnosis Date Noted  . Lumbar herniated disc 06/18/2014  . DDD (degenerative disc disease), lumbosacral 06/02/2014  . Spinal stenosis, lumbar region, with neurogenic claudication 06/02/2014  . Lumbar radiculopathy 06/02/2014  . Airway hyperreactivity 04/19/2014  . Chronic LBP 04/19/2014  . BP (high blood pressure) 04/19/2014  . Temporary cerebral vascular dysfunction 06/06/2013  . Compulsive tobacco user syndrome 05/22/2013  . 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 Lower urinary tract symptoms 12/27/2012    Past Surgical History:  Procedure Laterality Date  . BACK SURGERY    . CATARACT EXTRACTION W/PHACO Left 01/06/2016     Procedure: CATARACT EXTRACTION PHACO AND INTRAOCULAR LENS PLACEMENT (IOC);  Surgeon: Nevada Crane, MD;  Location: [facility];  Service: Ophthalmology;  Laterality: Left;  LEFT  . COLONOSCOPY WITH PROPOFOL N/A 04/19/2016   Procedure: COLONOSCOPY WITH PROPOFOL;  Surgeon: Scot Jun, MD;  Location: [facility] ENDOSCOPY;  Service: Endoscopy;  Laterality: N/A;  . ESOPHAGOGASTRODUODENOSCOPY (EGD) WITH PROPOFOL N/A 10/24/2015   Procedure: ESOPHAGOGASTRODUODENOSCOPY (EGD) WITH PROPOFOL;  Surgeon: Scot Jun, MD;  Location: [facility] ENDOSCOPY;  Service: Endoscopy;  Laterality: N/A;  . EYE SURGERY    . HERNIA REPAIR     ERRONEOUS UMBILICAL HERNIA   2011  . MYRINGOTOMY WITH TUBE PLACEMENT Bilateral 03/28/2015   Procedure: MYRINGOTOMY WITH TUBE PLACEMENT;  Surgeon: Linus Salmons, MD;  Location: [facility];  Service: ENT;  Laterality: Bilateral;  BUTTERFLY TUBE  . TONSILLECTOMY    . TRANSFORAMINAL LUMBAR INTERBODY FUSION (TLIF) WITH PEDICLE SCREW FIXATION 1 LEVEL Right 06/18/2014   Procedure: TRANSFORAMINAL LUMBAR INTERBODY FUSION (TLIF) WITH PEDICLE SCREW FIXATION 1 LEVEL LUMBAR 5 -SACRAL 1;  Surgeon: Aliene Beams, MD;  Location: [facility];  Service: Neurosurgery;  Laterality: Right;  Right transforaminal lumbar interbody fusion with interbody prosthesis and percutaneous pedicle screws Lumbar 5 to Sacral 1    Prior to Admission medications   Medication Sig Start Date End Date Taking? Authorizing Provider  acetaminophen (TYLENOL) 325 MG tablet Take 650 mg by mouth every 6 (six) hours as needed for mild pain or headache.  [provider]  albuterol (PROVENTIL HFA;VENTOLIN HFA) 108 (90 BASE) MCG/ACT inhaler Inhale 1-2 puffs into the lungs every 6 (six) hours as needed for wheezing or shortness of breath.     [provider]  ALPRAZolam Prudy Feeler) 1 MG tablet Take 1 mg by mouth at bedtime as needed for anxiety.    [provider]  cetirizine (ZYRTEC) 10 MG  tablet Take 10 mg by mouth daily.    [provider]  cyclobenzaprine (FLEXERIL) 10 MG tablet Take 1 tablet (10 mg total) by mouth 3 (three) times daily as needed for up to 5 days for muscle spasms. 11/27/17 12/02/17  Dionne Bucy, MD  Diclofenac Sodium 3 % GEL Place 1 application onto the skin every 12 (twelve) hours as needed. 08/04/16   Schaevitz, Myra Rude, MD  diphenhydrAMINE (BENADRYL) 25 mg capsule Take 1 capsule (25 mg total) by mouth every 6 (six) hours as needed. 11/06/16   Evon Slack, PA-C  fluticasone (FLONASE) 50 MCG/ACT nasal spray Place into both nostrils daily.    [provider]  gabapentin (NEURONTIN) 300 MG capsule Take 600 mg by mouth 3 (three) times daily.    [provider]  HYDROcodone-acetaminophen (NORCO) 5-325 MG tablet Take 1 tablet by mouth every 4 (four) hours as needed for moderate pain. 06/22/17   Willy Eddy, MD  ketorolac (TORADOL) 10 MG tablet Take 1 tablet (10 mg total) by mouth every 6 (six) hours as needed. 11/22/17   Enid Derry, PA-C  lidocaine (LIDODERM) 5 % Place 1 patch onto the skin daily. Remove & Discard patch within 12 hours or as directed by MD 11/22/17   Enid Derry, PA-C  lisinopril (PRINIVIL,ZESTRIL) 20 MG tablet Take 10 mg by mouth daily.    [provider]  Multiple Vitamin (MULTIVITAMIN) tablet Take 1 tablet by mouth daily.    [provider]  omeprazole (PRILOSEC) 40 MG capsule Take 40 mg by mouth daily.    [provider]  ondansetron (ZOFRAN) 4 MG tablet Take 1 tablet (4 mg total) by mouth every 8 (eight) hours as needed for nausea or vomiting. 09/08/17   Phineas Semen, MD  oxyCODONE-acetaminophen (PERCOCET) 5-325 MG tablet Take 1 tablet by mouth every 6 (six) hours as needed for up to 5 days for severe pain. 11/27/17 12/02/17  Dionne Bucy, MD  predniSONE (DELTASONE) 20 MG tablet Take 3 tablets (60 mg total) by mouth daily for 4 days. 11/28/17 12/02/17  Dionne Bucy, MD  tamsulosin (FLOMAX) 0.4 MG CAPS capsule Take 0.8 mg by mouth daily.  02/07/14   [provider]    Allergies Patient has no known allergies.  Family History  Problem Relation Age of Onset  . Arthritis Mother   . Hypertension Mother   . Diabetes Father   . Hypertension Father     Social History Social History   Tobacco Use  . Smoking status: Current Every Day Smoker    Packs/day: 1.50    Years: 40.00    Pack years: 60.00    Types: Cigarettes  . Smokeless tobacco: Never Used  . Tobacco comment: has cut back to 1PPD  Substance Use Topics  . Alcohol use: No    Alcohol/week: 0.0 standard drinks    Comment: ONCE OR TWICE A YR  . Drug use: No    Review of Systems  Constitutional: No fever. Eyes: No redness. ENT: No neck pain. Cardiovascular: Denies chest pain. Respiratory: Denies shortness of breath. Gastrointestinal: No abdominal pain.  Genitourinary: Negative for flank pain.  Musculoskeletal: Positive for back pain. Skin: Negative for rash. Neurological: Negative for focal weakness or numbness.   ____________________________________________   PHYSICAL EXAM:  VITAL SIGNS: ED Triage Vitals  Enc Vitals Group     BP 11/27/17 0417 140/78     Pulse Rate 11/27/17 0417 78     Resp 11/27/17 0417 17     Temp 11/27/17 0417 97.8 F (36.6 C)     Temp Source 11/27/17 0417 Oral     SpO2 11/27/17 0417 98 %     Weight 11/27/17 0416 174 lb (78.9 kg)     Height 11/27/17 0416 5\' 7"  (1.702 m)     Head Circumference --      Peak Flow --      Pain Score 11/27/17 0416 10     Pain Loc --      Pain Edu? --      Excl. in GC? --     Constitutional: Alert and oriented.  Relatively well appearing and in no acute distress. Eyes: Conjunctivae are normal.  Head: Atraumatic. Nose: No congestion/rhinnorhea. Mouth/Throat: Mucous membranes are moist.   Neck: Normal range of motion.  Cardiovascular:  Good peripheral circulation. Respiratory: Normal  respiratory effort. Gastrointestinal: Soft and nontender. No distention.  Genitourinary: No CVA tenderness. Musculoskeletal: No lower extremity edema.  Extremities warm and well perfused.  No midline spinal tenderness.  Right lumbar paraspinal tenderness.  Negative right straight leg raise. Neurologic:  Normal speech and language. No gross focal neurologic deficits are appreciated.  5/5 motor strength and intact sensation of bilateral lower extremities. Skin:  Skin is warm and dry. No rash noted. Psychiatric: Mood and affect are normal. Speech and behavior are normal.  ____________________________________________   LABS (all labs ordered are listed, but only abnormal results are displayed)  Labs Reviewed - No data to display ____________________________________________  EKG   ____________________________________________  RADIOLOGY    ____________________________________________   PROCEDURES  Procedure(s) performed: No  Procedures  Critical Care performed: No ____________________________________________   INITIAL IMPRESSION / ASSESSMENT AND PLAN / ED COURSE  Pertinent labs & imaging results that were available during my care of the patient were reviewed by me and considered in my medical decision making (see chart for details).  62 year old male with a history of chronic back pain both before and after lumbar fusion in 2016 presents with persistent right lower back pain.  The patient was seen in the ED last week for this and states he had good relief with the  medication regimen he was prescribed, but states he lifted something yesterday and then the back pain recurred.  On exam, he is relatively well-appearing and his vital signs are normal.  He has right lumbar midline spinal tenderness and negative straight leg raise.  No neuro deficits.  I reviewed the past medical records in epic and confirmed that the patient was seen in the ED on 11/22/2017 and prescribed a [place]  amount of prednisone, as well as Percocet, Flexeril, and Toradol.  He was seen in the ED in September of this year for kidney stone pain, and urinary symptoms in August.  Overall presentation is consistent with chronic radicular pain.  The patient has good insight into the chronic nature of the pain and states he does not want to be on any opiates chronically.  He has had a pain management specialist before, and is actively seeking follow-up with pain management at this time but states the paperwork has not yet  gone through.  There is no evidence of narcotic dependence, abuse, or drug-seeking behavior.  I will give Percocet and Toradol in the ED and plan to give the patient a short course of some additional pain medication for home.  ----------------------------------------- 7:17 AM on 11/27/2017 -----------------------------------------  The patient's pain is starting to improve although the Percocet likely has not fully kicked in yet.  I have also added on Flexeril and prednisone. I signed the patient out to the oncoming physician for reassessment and likely discharge when his pain is improved. ____________________________________________   FINAL CLINICAL IMPRESSION(S) / ED DIAGNOSES  Final diagnoses:  Acute right-sided low back pain with right-sided sciatica      NEW MEDICATIONS STARTED DURING THIS VISIT:  New Prescriptions   CYCLOBENZAPRINE (FLEXERIL) 10 MG TABLET    Take 1 tablet (10 mg total) by mouth 3 (three) times daily as needed for up to 5 days for muscle spasms.   OXYCODONE-ACETAMINOPHEN (PERCOCET) 5-325 MG TABLET    Take 1 tablet by mouth every 6 (six) hours as needed for up to 5 days for severe pain.   PREDNISONE (DELTASONE) 20 MG TABLET    Take 3 tablets (60 mg total) by mouth daily for 4 days.     Note:  This document was prepared using Dragon voice recognition software and may include unintentional dictation errors.    Dionne Bucy, MD 11/27/17 [phone]

## 2017-11-28 DIAGNOSIS — F411 Generalized anxiety disorder: Secondary | ICD-10-CM | POA: Diagnosis not present

## 2017-11-30 DIAGNOSIS — R7989 Other specified abnormal findings of blood chemistry: Secondary | ICD-10-CM | POA: Diagnosis not present

## 2017-11-30 DIAGNOSIS — R61 Generalized hyperhidrosis: Secondary | ICD-10-CM | POA: Diagnosis not present

## 2017-12-01 ENCOUNTER — Emergency Department
Admission: EM | Admit: 2017-12-01 | Discharge: 2017-12-01 | Disposition: A | Discharge status: Home / Self Care | Payer: Medicaid Other | Attending: Emergency Medicine | Admitting: Emergency Medicine

## 2017-12-01 DIAGNOSIS — Z79899 Other long term (current) drug therapy: Secondary | ICD-10-CM | POA: Diagnosis not present

## 2017-12-01 DIAGNOSIS — J45909 Unspecified asthma, uncomplicated: Secondary | ICD-10-CM | POA: Diagnosis not present

## 2017-12-01 DIAGNOSIS — M549 Dorsalgia, unspecified: Secondary | ICD-10-CM | POA: Diagnosis present

## 2017-12-01 DIAGNOSIS — I129 Hypertensive chronic kidney disease with stage 1 through stage 4 chronic kidney disease, or unspecified chronic kidney disease: Secondary | ICD-10-CM | POA: Insufficient documentation

## 2017-12-01 DIAGNOSIS — N189 Chronic kidney disease, unspecified: Secondary | ICD-10-CM | POA: Diagnosis not present

## 2017-12-01 DIAGNOSIS — J449 Chronic obstructive pulmonary disease, unspecified: Secondary | ICD-10-CM | POA: Diagnosis not present

## 2017-12-01 DIAGNOSIS — M5441 Lumbago with sciatica, right side: Secondary | ICD-10-CM | POA: Diagnosis not present

## 2017-12-01 DIAGNOSIS — M5431 Sciatica, right side: Secondary | ICD-10-CM

## 2017-12-01 DIAGNOSIS — F17210 Nicotine dependence, cigarettes, uncomplicated: Secondary | ICD-10-CM | POA: Insufficient documentation

## 2017-12-01 MED ORDER — LIDOCAINE 5 % EX PTCH
1.0000 | MEDICATED_PATCH | CUTANEOUS | Status: DC
  Administered 2017-12-01: 1 via TRANSDERMAL
  Filled 2017-12-01: qty 1

## 2017-12-01 MED ORDER — DIAZEPAM 2 MG PO TABS: 2.0000 mg | ORAL_TABLET | Freq: Three times a day (TID) | ORAL | 0 refills | Status: DC | PRN

## 2017-12-01 MED ORDER — OXYCODONE-ACETAMINOPHEN 5-325 MG PO TABS
1.0000 | ORAL_TABLET | Freq: Once | ORAL | Status: AC
  Administered 2017-12-01: 1 via ORAL
  Filled 2017-12-01: qty 1

## 2017-12-01 MED ORDER — DEXAMETHASONE SODIUM PHOSPHATE 10 MG/ML IJ SOLN
10.0000 mg | Freq: Once | INTRAMUSCULAR | Status: AC
  Administered 2017-12-01: 10 mg via INTRAMUSCULAR
  Filled 2017-12-01: qty 1

## 2017-12-01 MED ORDER — KETOROLAC TROMETHAMINE 30 MG/ML IJ SOLN
30.0000 mg | Freq: Once | INTRAMUSCULAR | Status: AC
  Administered 2017-12-01: 30 mg via INTRAMUSCULAR
  Filled 2017-12-01: qty 1

## 2017-12-01 MED ORDER — NAPROXEN 500 MG PO TABS: 500.0000 mg | ORAL_TABLET | Freq: Two times a day (BID) | ORAL | 0 refills | Status: DC

## 2017-12-01 NOTE — ED Provider Notes (Signed)
[facility] Emergency Department Provider Note   ____________________________________________   First MD Initiated Contact with Patient 12/01/17 [phone]     (approximate)  I have reviewed the triage vital signs and the nursing notes.   HISTORY  Chief Complaint Back Pain    HPI Ryan [name] is a 62 y.o. male who returns to the ED from home with a chief complaint of nontraumatic back pain.  Patient has a history of lumbar herniated disc and degenerative disc disease status post surgery in 2016.  Reports a 3-week history of right lower back pain radiating to his leg.  Denies associated extremity weakness, numbness/tingling, bowel or bladder incontinence.  This is patient's third visit since the end of October for this problem.  He has not seen his neurosurgeon for this.  He is awaiting placement in a pain clinic.  Denies fever, chills, chest pain, shortness of breath, abdominal pain, nausea or vomiting.   Past Medical History:  Diagnosis Date  . Arthritis    left hand  . Asthma   . Chronic kidney disease    HAS HAD KIDNEY STONE 2015-- JUST PASSED  . COPD (chronic obstructive pulmonary disease) (HCC)   . Dyspnea   . GERD (gastroesophageal reflux disease)    TAKES TUMS FOR RELIEF  . Hyperlipidemia   . Hypertension    Improved, No longer on meds  . Lower urinary tract symptoms (LUTS)   . Wears dentures    hass full upper plate, not wearing, broken  . Wears hearing aid     Patient Active Problem List   Diagnosis Date Noted  . Lumbar herniated disc 06/18/2014  . DDD (degenerative disc disease), lumbosacral 06/02/2014  . Spinal stenosis, lumbar region, with neurogenic claudication 06/02/2014  . Lumbar radiculopathy 06/02/2014  . Airway hyperreactivity 04/19/2014  . Chronic LBP 04/19/2014  . BP (high blood pressure) 04/19/2014  . Temporary cerebral vascular dysfunction 06/06/2013  . Compulsive tobacco user syndrome 05/22/2013  . ED (erectile  dysfunction) of organic origin 12/27/2012  . Lower urinary tract symptoms 12/27/2012    Past Surgical History:  Procedure Laterality Date  . BACK SURGERY    . CATARACT EXTRACTION W/PHACO Left 01/06/2016   Procedure: CATARACT EXTRACTION PHACO AND INTRAOCULAR LENS PLACEMENT (IOC);  Surgeon: Nevada Crane, MD;  Location: [facility];  Service: Ophthalmology;  Laterality: Left;  LEFT  . COLONOSCOPY WITH PROPOFOL N/A 04/19/2016   Procedure: COLONOSCOPY WITH PROPOFOL;  Surgeon: Scot Jun, MD;  Location: [facility] ENDOSCOPY;  Service: Endoscopy;  Laterality: N/A;  . ESOPHAGOGASTRODUODENOSCOPY (EGD) WITH PROPOFOL N/A 10/24/2015   Procedure: ESOPHAGOGASTRODUODENOSCOPY (EGD) WITH PROPOFOL;  Surgeon: Scot Jun, MD;  Location: [facility] ENDOSCOPY;  Service: Endoscopy;  Laterality: N/A;  . EYE SURGERY    . HERNIA REPAIR     ERRONEOUS UMBILICAL HERNIA   2011  . MYRINGOTOMY WITH TUBE PLACEMENT Bilateral 03/28/2015   Procedure: MYRINGOTOMY WITH TUBE PLACEMENT;  Surgeon: Linus Salmons, MD;  Location: [facility];  Service: ENT;  Laterality: Bilateral;  BUTTERFLY TUBE  . TONSILLECTOMY    . TRANSFORAMINAL LUMBAR INTERBODY FUSION (TLIF) WITH PEDICLE SCREW FIXATION 1 LEVEL Right 06/18/2014   Procedure: TRANSFORAMINAL LUMBAR INTERBODY FUSION (TLIF) WITH PEDICLE SCREW FIXATION 1 LEVEL LUMBAR 5 -SACRAL 1;  Surgeon: Aliene Beams, MD;  Location: [facility];  Service: Neurosurgery;  Laterality: Right;  Right transforaminal lumbar interbody fusion with interbody prosthesis and percutaneous pedicle screws Lumbar 5 to Sacral 1    Prior to Admission  medications   Medication Sig Start Date End Date Taking? Authorizing Provider  acetaminophen (TYLENOL) 325 MG tablet Take 650 mg by mouth every 6 (six) hours as needed for mild pain or headache.     [provider]  albuterol (PROVENTIL HFA;VENTOLIN HFA) 108 (90 BASE) MCG/ACT inhaler Inhale 1-2 puffs into the lungs every 6 (six) hours as  needed for wheezing or shortness of breath.     [provider]  ALPRAZolam Prudy Feeler) 1 MG tablet Take 1 mg by mouth at bedtime as needed for anxiety.    [provider]  cetirizine (ZYRTEC) 10 MG tablet Take 10 mg by mouth daily.    [provider]  cyclobenzaprine (FLEXERIL) 10 MG tablet Take 1 tablet (10 mg total) by mouth 3 (three) times daily as needed for up to 5 days for muscle spasms. 11/27/17 12/02/17  Myrna Blazer, MD  Diclofenac Sodium 3 % GEL Place 1 application onto the skin every 12 (twelve) hours as needed. 08/04/16   Schaevitz, Myra Rude, MD  diphenhydrAMINE (BENADRYL) 25 mg capsule Take 1 capsule (25 mg total) by mouth every 6 (six) hours as needed. 11/06/16   Evon Slack, PA-C  fluticasone (FLONASE) 50 MCG/ACT nasal spray Place into both nostrils daily.    [provider]  gabapentin (NEURONTIN) 300 MG capsule Take 600 mg by mouth 3 (three) times daily.    [provider]  HYDROcodone-acetaminophen (NORCO) 5-325 MG tablet Take 1 tablet by mouth every 4 (four) hours as needed for moderate pain. 06/22/17   Willy Eddy, MD  ketorolac (TORADOL) 10 MG tablet Take 1 tablet (10 mg total) by mouth every 6 (six) hours as needed. 11/22/17   Enid Derry, PA-C  lidocaine (LIDODERM) 5 % Place 1 patch onto the skin daily. Remove & Discard patch within 12 hours or as directed by MD 11/22/17   Enid Derry, PA-C  lisinopril (PRINIVIL,ZESTRIL) 20 MG tablet Take 10 mg by mouth daily.    [provider]  Multiple Vitamin (MULTIVITAMIN) tablet Take 1 tablet by mouth daily.    [provider]  omeprazole (PRILOSEC) 40 MG capsule Take 40 mg by mouth daily.    [provider]  ondansetron (ZOFRAN) 4 MG tablet Take 1 tablet (4 mg total) by mouth every 8 (eight) hours as needed for nausea or vomiting. 09/08/17   Phineas Semen, MD  oxyCODONE-acetaminophen (PERCOCET) 5-325 MG tablet Take 1 tablet by mouth every  6 (six) hours as needed for up to 5 days for severe pain. 11/27/17 12/02/17  Dionne Bucy, MD  oxyCODONE-acetaminophen (PERCOCET) 5-325 MG tablet Take 1 tablet by mouth every 6 (six) hours as needed for severe pain. 11/27/17 11/27/18  Myrna Blazer, MD  predniSONE (DELTASONE) 20 MG tablet Take 3 tablets (60 mg total) by mouth daily for 4 days. 11/28/17 12/02/17  Myrna Blazer, MD  tamsulosin (FLOMAX) 0.4 MG CAPS capsule Take 0.8 mg by mouth daily.  02/07/14   [provider]    Allergies Patient has no known allergies.  Family History  Problem Relation Age of Onset  . Arthritis Mother   . Hypertension Mother   . Diabetes Father   . Hypertension Father     Social History Social History   Tobacco Use  . Smoking status: Current Every Day Smoker    Packs/day: 1.50    Years: 40.00    Pack years: 60.00    Types: Cigarettes  . Smokeless tobacco: Never Used  . Tobacco  comment: has cut back to 1PPD  Substance Use Topics  . Alcohol use: No    Alcohol/week: 0.0 standard drinks    Comment: ONCE OR TWICE A YR  . Drug use: No    Review of Systems  Constitutional: No fever/chills Eyes: No visual changes. ENT: No sore throat. Cardiovascular: Denies chest pain. Respiratory: Denies shortness of breath. Gastrointestinal: No abdominal pain.  No nausea, no vomiting.  No diarrhea.  No constipation. Genitourinary: Negative for dysuria. Musculoskeletal: Positive for back pain. Skin: Negative for rash. Neurological: Negative for headaches, focal weakness or numbness.   ____________________________________________   PHYSICAL EXAM:  VITAL SIGNS: ED Triage Vitals [12/01/17 0107]  Enc Vitals Group     BP 116/72     Pulse Rate 68     Resp 18     Temp 97.9 F (36.6 C)     Temp Source Oral     SpO2 95 %     Weight 174 lb 2.6 oz (79 kg)     Height 5\' 7"  (1.702 m)     Head Circumference      Peak Flow      Pain Score 7     Pain Loc      Pain Edu?       Excl. in GC?     Constitutional: Asleep, awakened for exam.  Alert and oriented. Well appearing and in no acute distress. Eyes: Conjunctivae are normal. PERRL. EOMI. Head: Atraumatic. Nose: No congestion/rhinnorhea. Mouth/Throat: Mucous membranes are moist.  Oropharynx non-erythematous. Neck: No stridor.  No cervical spine tenderness to palpation. Cardiovascular: Normal rate, regular rhythm. Grossly normal heart sounds.  Good peripheral circulation. Respiratory: Normal respiratory effort.  No retractions. Lungs CTAB. Gastrointestinal: Soft and nontender to light or deep palpation. No distention. No abdominal bruits. No CVA tenderness. Musculoskeletal: No spinal tenderness to palpation.  Right lumbar paraspinal muscle spasms.  Straight leg raise positive at 45 degrees on the right.  No lower extremity tenderness nor edema.  No joint effusions. Neurologic:  Normal speech and language. No gross focal neurologic deficits are appreciated.  5/5 motor strength and sensation.  Ambulated to treatment room with steady gait.   Skin:  Skin is warm, dry and intact. No rash noted. Psychiatric: Mood and affect are normal. Speech and behavior are normal.  ____________________________________________   LABS (all labs ordered are listed, but only abnormal results are displayed)  Labs Reviewed - No data to display ____________________________________________  EKG  None ____________________________________________  RADIOLOGY  ED MD interpretation: None  Official radiology report(s): No results found.  ____________________________________________   PROCEDURES  Procedure(s) performed: None  Procedures  Critical Care performed: No  ____________________________________________   INITIAL IMPRESSION / ASSESSMENT AND PLAN / ED COURSE  As part of my medical decision making, I reviewed the following data within the electronic MEDICAL RECORD NUMBER Nursing notes reviewed and incorporated, Old  chart reviewed, Notes from prior ED visits and [place] Controlled Substance Database   62 year old male who presents with back pain, sciatica without focal neurological deficits.  Differential diagnosis includes but is not limited to acute on chronic pain, sciatica, kidney stones, stenosis, degenerative disc disease, AAA, etc.  I personally reviewed patient's old chart.  Advised him I will not refill his opiate prescription.  Encouraged him to continue with lidocaine patches.  Will prescribe Naprosyn and Valium for muscle relaxation.  Strongly encouraged patient to follow-up with his neurosurgeon.  Strict return precautions given.  Patient verbalizes understanding and agrees with plan of  care.     ____________________________________________   FINAL CLINICAL IMPRESSION(S) / ED DIAGNOSES  Final diagnoses:  Sciatica of right side  Acute right-sided low back pain with right-sided sciatica     ED Discharge Orders    None       Note:  This document was prepared using Dragon voice recognition software and may include unintentional dictation errors.    Irean Hong, MD 12/01/17 [phone]

## 2017-12-01 NOTE — ED Triage Notes (Signed)
Patient reports back surgery 2 years ago. Patient c/o back pain radiating down back and right leg. Patient denies new injury. Patient reports having MRI or CT done in this ED a few weeks ago and was told that his original surgery did not heal correctly. Patient has been seen 3 times in this ED for same in the last few weeks.  Patient has not followed up post ED visits.

## 2017-12-01 NOTE — ED Notes (Signed)
Pts wife has arrived to pick him up.

## 2017-12-01 NOTE — Discharge Instructions (Signed)
Continue to use the lidocaine patches prescribed to you on 10/29.  You may take Naprosyn twice daily for pain and Valium as needed for muscle relaxation.  Return to the ER for worsening symptoms, persistent vomiting, difficulty breathing, losing control of your bowel or bladder, or other concerns.

## 2017-12-09 ENCOUNTER — Encounter: Payer: Self-pay | Admitting: Emergency Medicine

## 2017-12-09 ENCOUNTER — Other Ambulatory Visit: Payer: Self-pay

## 2017-12-09 ENCOUNTER — Emergency Department
Admission: EM | Admit: 2017-12-09 | Discharge: 2017-12-09 | Disposition: A | Discharge status: Home / Self Care | Payer: Medicaid Other | Attending: Emergency Medicine | Admitting: Emergency Medicine

## 2017-12-09 DIAGNOSIS — M5489 Other dorsalgia: Secondary | ICD-10-CM | POA: Diagnosis present

## 2017-12-09 DIAGNOSIS — G8929 Other chronic pain: Secondary | ICD-10-CM | POA: Insufficient documentation

## 2017-12-09 DIAGNOSIS — F17210 Nicotine dependence, cigarettes, uncomplicated: Secondary | ICD-10-CM | POA: Insufficient documentation

## 2017-12-09 DIAGNOSIS — J449 Chronic obstructive pulmonary disease, unspecified: Secondary | ICD-10-CM | POA: Insufficient documentation

## 2017-12-09 DIAGNOSIS — Z79899 Other long term (current) drug therapy: Secondary | ICD-10-CM | POA: Diagnosis not present

## 2017-12-09 DIAGNOSIS — I10 Essential (primary) hypertension: Secondary | ICD-10-CM | POA: Insufficient documentation

## 2017-12-09 DIAGNOSIS — M5441 Lumbago with sciatica, right side: Secondary | ICD-10-CM | POA: Diagnosis not present

## 2017-12-09 DIAGNOSIS — M5431 Sciatica, right side: Secondary | ICD-10-CM | POA: Insufficient documentation

## 2017-12-09 MED ORDER — GABAPENTIN 300 MG PO CAPS
900.0000 mg | ORAL_CAPSULE | Freq: Once | ORAL | Status: AC
  Administered 2017-12-09: 900 mg via ORAL
  Filled 2017-12-09 (×2): qty 3

## 2017-12-09 MED ORDER — GABAPENTIN 300 MG PO CAPS: 600.0000 mg | ORAL_CAPSULE | Freq: Three times a day (TID) | ORAL | 0 refills | Status: DC

## 2017-12-09 MED ORDER — HYDROMORPHONE HCL 1 MG/ML IJ SOLN
2.0000 mg | Freq: Once | INTRAMUSCULAR | Status: AC
  Administered 2017-12-09: 2 mg via INTRAMUSCULAR
  Filled 2017-12-09: qty 2

## 2017-12-09 MED ORDER — DIAZEPAM 5 MG PO TABS
5.0000 mg | ORAL_TABLET | Freq: Once | ORAL | Status: AC
  Administered 2017-12-09: 5 mg via ORAL
  Filled 2017-12-09: qty 1

## 2017-12-09 NOTE — ED Triage Notes (Signed)
Patient ambulatory to triage with steady gait, without difficulty or distress noted; pt c/o lower back pain radiating down right leg; st has been seen x 3 for same and is waiting for a referral for ortho doctor; pain management appt in 2wks

## 2017-12-09 NOTE — ED Notes (Signed)
Called wife to pick him up

## 2017-12-09 NOTE — Discharge Instructions (Signed)
It is critically important that you make an appointment with both the neurosurgeon as well as the pain clinic for further treatment and evaluation.  You are not taking enough gabapentin for it to be as successful as it could be.  Please increase your dose to 600 mg 3 times a day instead of 600 mg twice a day.  Return to the emergency department for any concerns.  While here in the ER today you received very powerful medicine that makes it unsafe for you to drive for the rest of the day.  Do not drive until tomorrow.  It was a pleasure to take care of you today, and thank you for coming to our emergency department.  If you have any questions or concerns before leaving please ask the nurse to grab me and I'm more than happy to go through your aftercare instructions again.  If you were prescribed any opioid pain medication today such as Norco, Vicodin, Percocet, morphine, hydrocodone, or oxycodone please make sure you do not drive when you are taking this medication as it can alter your ability to drive safely.  If you have any concerns once you are home that you are not improving or are in fact getting worse before you can make it to your follow-up appointment, please do not hesitate to call 911 and come back for further evaluation.  Merrily BrittleNeil Damascus Feldpausch, MD   Dg Lumbar Spine 2-3 Views  Result Date: 11/22/2017 CLINICAL DATA:  Low back pain with radiculopathy. EXAM: LUMBAR SPINE - 2-3 VIEW COMPARISON:  CT abdomen pelvis 10/19/2017 FINDINGS: Mild anterolisthesis L4-5. Remaining alignment normal. Negative for fracture Advanced disc degeneration L4-5 with disc space narrowing, endplate sclerosis and spurring similar to the prior study. Pedicle screw and interbody fusion L5-S1. Question pseudarthrosis. 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  Mild anterolisthesis L3-4 Pedicle screw interbody fusion L5-S1.  Question pseudarthrosis. Electronically Signed   By: Marlan Palauharles  Clark M.D.   On: 11/22/2017 11:01

## 2017-12-09 NOTE — ED Provider Notes (Signed)
[facility] Emergency Department Provider Note  ____________________________________________   First MD Initiated Contact with Patient 12/09/17 [phone]     (approximate)  I have reviewed the triage vital signs and the nursing notes.   HISTORY  Chief Complaint Back Pain   HPI Ryan [name] is a 62 y.o. male who comes to the emergency department with right low back pain radiating from his right low back down his buttocks down the back of his right leg towards his toes for the past 3 weeks or so.  This is his fourth visit in the past 2 weeks to our emergency department for the same.  He has been unable to get an appointment to see pain management for another 2 weeks and he is yet to have a referral to neurosurgery or orthopedic surgery.  6 or 7 years ago he had a lumbar fusion performed in [place] and initially his pain improved after the surgery however for the past 2 weeks he has had an acute exacerbation unlike any he has had since.  He denies weakness.  No difficulty initiating urination or defecation.  He has not fallen.  No fevers or chills.  The pain does not wake him from sleep.  He is not on chronic steroids, has never used IV drugs, and has never had tuberculosis.  His pain does tend to be improved with opioids and worse with movement.    Past Medical History:  Diagnosis Date  . Arthritis    left hand  . Asthma   . Chronic kidney disease    HAS HAD KIDNEY STONE 2015-- JUST PASSED  . COPD (chronic obstructive pulmonary disease) (HCC)   . Dyspnea   . GERD (gastroesophageal reflux disease)    TAKES TUMS FOR RELIEF  . Hyperlipidemia   . Hypertension    Improved, No longer on meds  . Lower urinary tract symptoms (LUTS)   . Wears dentures    hass full upper plate, not wearing, broken  . Wears hearing aid     Patient Active Problem List   Diagnosis Date Noted  . Lumbar herniated disc 06/18/2014  . DDD (degenerative disc disease), lumbosacral  06/02/2014  . Spinal stenosis, lumbar region, with neurogenic claudication 06/02/2014  . Lumbar radiculopathy 06/02/2014  . Airway hyperreactivity 04/19/2014  . Chronic LBP 04/19/2014  . BP (high blood pressure) 04/19/2014  . Temporary cerebral vascular dysfunction 06/06/2013  . Compulsive tobacco user syndrome 05/22/2013  . 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 Lower urinary tract symptoms 12/27/2012    Past Surgical History:  Procedure Laterality Date  . BACK SURGERY    . CATARACT EXTRACTION W/PHACO Left 01/06/2016   Procedure: CATARACT EXTRACTION PHACO AND INTRAOCULAR LENS PLACEMENT (IOC);  Surgeon: Nevada Crane, MD;  Location: [facility];  Service: Ophthalmology;  Laterality: Left;  LEFT  . COLONOSCOPY WITH PROPOFOL N/A 04/19/2016   Procedure: COLONOSCOPY WITH PROPOFOL;  Surgeon: Scot Jun, MD;  Location: [facility] END[facility]COPY;  Service: Endoscopy;  Laterality: N/A;  . ESOPHAGOGASTRODUODENOSCOPY (EGD) WITH PROPOFOL N/A 10/24/2015   Procedure: ESOPHAGOGASTRODUODENOSCOPY (EGD) WITH PROPOFOL;  Surgeon: Scot Jun, MD;  Location: [facility] ENDOSCOPY;  Service: Endoscopy;  Laterality: N/A;  . EYE SURGERY    . HERNIA REPAIR     ERRONEOUS UMBILICAL HERNIA   2011  . MYRINGOTOMY WITH TUBE PLACEMENT Bilateral 03/28/2015   Procedure: MYRINGOTOMY WITH TUBE PLACEMENT;  Surgeon: Linus Salmons, MD;  Location: [facility];  Service: ENT;  Laterality:  Bilateral;  BUTTERFLY TUBE  . TONSILLECTOMY    . TRANSFORAMINAL LUMBAR INTERBODY FUSION (TLIF) WITH PEDICLE SCREW FIXATION 1 LEVEL Right 06/18/2014   Procedure: TRANSFORAMINAL LUMBAR INTERBODY FUSION (TLIF) WITH PEDICLE SCREW FIXATION 1 LEVEL LUMBAR 5 -SACRAL 1;  Surgeon: Aliene Beams, MD;  Location: [facility] NEURO ORS;  Service: Neurosurgery;  Laterality: Right;  Right transforaminal lumbar interbody fusion with interbody prosthesis and percutaneous pedicle screws Lumbar 5 to Sacral 1    Prior to Admission  medications   Medication Sig Start Date End Date Taking? Authorizing Provider  acetaminophen (TYLENOL) 325 MG tablet Take 650 mg by mouth every 6 (six) hours as needed for mild pain or headache.     [provider]  albuterol (PROVENTIL HFA;VENTOLIN HFA) 108 (90 BASE) MCG/ACT inhaler Inhale 1-2 puffs into the lungs every 6 (six) hours as needed for wheezing or shortness of breath.     [provider]  ALPRAZolam Prudy Feeler) 1 MG tablet Take 1 mg by mouth at bedtime as needed for anxiety.    [provider]  cetirizine (ZYRTEC) 10 MG tablet Take 10 mg by mouth daily.    [provider]  diazepam (VALIUM) 2 MG tablet Take 1 tablet (2 mg total) by mouth every 8 (eight) hours as needed for muscle spasms. 12/01/17   Irean Hong, MD  Diclofenac Sodium 3 % GEL Place 1 application onto the skin every 12 (twelve) hours as needed. 08/04/16   Schaevitz, Myra Rude, MD  diphenhydrAMINE (BENADRYL) 25 mg capsule Take 1 capsule (25 mg total) by mouth every 6 (six) hours as needed. 11/06/16   Evon Slack, PA-C  fluticasone (FLONASE) 50 MCG/ACT nasal spray Place into both nostrils daily.    [provider]  gabapentin (NEURONTIN) 300 MG capsule Take 2 capsules (600 mg total) by mouth 3 (three) times daily. 12/09/17 01/08/18  Merrily Brittle, MD  HYDROcodone-acetaminophen (NORCO) 5-325 MG tablet Take 1 tablet by mouth every 4 (four) hours as needed for moderate pain. 06/22/17   Willy Eddy, MD  ketorolac (TORADOL) 10 MG tablet Take 1 tablet (10 mg total) by mouth every 6 (six) hours as needed. 11/22/17   Enid Derry, PA-C  lidocaine (LIDODERM) 5 % Place 1 patch onto the skin daily. Remove & Discard patch within 12 hours or as directed by MD 11/22/17   Enid Derry, PA-C  lisinopril (PRINIVIL,ZESTRIL) 20 MG tablet Take 10 mg by mouth daily.    [provider]  Multiple Vitamin (MULTIVITAMIN) tablet Take 1 tablet by mouth daily.    [provider]  naproxen (NAPROSYN) 500 MG tablet Take 1 tablet (500 mg total) by mouth 2 (two) times daily with a meal. 12/01/17   Irean Hong, MD  omeprazole (PRILOSEC) 40 MG capsule Take 40 mg by mouth daily.    [provider]  ondansetron (ZOFRAN) 4 MG tablet Take 1 tablet (4 mg total) by mouth every 8 (eight) hours as needed for nausea or vomiting. 09/08/17   Phineas Semen, MD  oxyCODONE-acetaminophen (PERCOCET) 5-325 MG tablet Take 1 tablet by mouth every 6 (six) hours as needed for severe pain. 11/27/17 11/27/18  Myrna Blazer, MD  tamsulosin (FLOMAX) 0.4 MG CAPS capsule Take 0.8 mg by mouth daily.  02/07/14   [provider]    Allergies Patient has no known allergies.  Family History  Problem Relation Age of Onset  . Arthritis Mother   . Hypertension Mother   . Diabetes Father   .  Hypertension Father     Social History Social History   Tobacco Use  . Smoking status: Current Every Day Smoker    Packs/day: 1.50    Years: 40.00    Pack years: 60.00    Types: Cigarettes  . Smokeless tobacco: Never Used  . Tobacco comment: has cut back to 1PPD  Substance Use Topics  . Alcohol use: No    Alcohol/week: 0.0 standard drinks    Comment: ONCE OR TWICE A YR  . Drug use: No    Review of Systems Constitutional: No fever/chills Cardiovascular: Denies chest pain. Respiratory: Denies shortness of breath. Gastrointestinal: No abdominal pain.  No nausea, no vomiting.   Musculoskeletal: Positive for back pain. Neurological: Negative for headaches   ____________________________________________   PHYSICAL EXAM:  VITAL SIGNS: ED Triage Vitals  Enc Vitals Group     BP 12/09/17 0215 (!) 157/45     Pulse Rate 12/09/17 0215 73     Resp 12/09/17 0215 18     Temp 12/09/17 0215 98 F (36.7 C)     Temp Source 12/09/17 0215 Oral     SpO2 12/09/17 0215 95 %     Weight 12/09/17 0214 175 lb (79.4 kg)     Height 12/09/17 0214 5\' 7"  (1.702 m)     Head Circumference  --      Peak Flow --      Pain Score 12/09/17 0214 8     Pain Loc --      Pain Edu? --      Excl. in GC? --     Constitutional: Alert and oriented x4 appears quite stiff and uncomfortable in bed Head: Atraumatic. Nose: No congestion/rhinnorhea. Mouth/Throat: No trismus Neck: No stridor.   Cardiovascular: Regular rate and rhythm Respiratory: Normal respiratory effort.  No retractions. MSK: Positive straight leg raise on the right and cross.  Significant right lumbar paraspinal spasm 5 out of 5 hip flexion hip extension plantar flexion dorsiflexion 2+ DTRs no ankle clonus and sensation intact light touch Neurologic:  Normal speech and language. No gross focal neurologic deficits are appreciated.  Skin:  Skin is warm, dry and intact. No rash noted.    ____________________________________________  LABS (all labs ordered are listed, but only abnormal results are displayed)  Labs Reviewed - No data to display   __________________________________________  EKG   ____________________________________________  RADIOLOGY  I reviewed the patient's x-ray of his lumbar spine performed 11/22/2017 showing significant arthritis ____________________________________________   DIFFERENTIAL includes but not limited to  Sciatica, cauda equina, discitis, epidural abscess   PROCEDURES  Procedure(s) performed: no  Procedures  Critical Care performed: no  ____________________________________________   INITIAL IMPRESSION / ASSESSMENT AND PLAN / ED COURSE  Pertinent labs & imaging results that were available during my care of the patient were reviewed by me and considered in my medical decision making (see chart for details).   As part of my medical decision making, I reviewed the following data within the electronic MEDICAL RECORD NUMBER History obtained from family if available, nursing notes, old chart and ekg, as well as notes from prior ED visits.  The patient comes to the  emergency department with a significant amount of right lumbar paraspinal spasm and an exam that is consistent with sciatica.  He has been taking 600 mg of gabapentin twice a day and we discussed that gabapentin is ideally dosed 3 times a day and that he can actually get all the way up to 900 mg or  even 1200 mg 3 times a day.  He is not driving so have given him 2 mg of intramuscular hydromorphone along with 900 mg of gabapentin and 5 mg of oral Valium with good relief.  He remains neurovascularly intact.  I explained to the patient I would not refill any benzodiazepines or opioids but I am happy to refill his gabapentin at a higher dose.  We will also refer him to neurosurgery.  Strict return precautions have been given.      ____________________________________________   FINAL CLINICAL IMPRESSION(S) / ED DIAGNOSES  Final diagnoses:  Sciatica of right side  Other chronic pain      NEW MEDICATIONS STARTED DURING THIS VISIT:  Discharge Medication List as of 12/09/2017  4:08 AM       Note:  This document was prepared using Dragon voice recognition software and may include unintentional dictation errors.      Merrily Brittleifenbark, Tiera Mensinger, MD 12/11/17 [phone]

## 2017-12-13 DIAGNOSIS — E0590 Thyrotoxicosis, unspecified without thyrotoxic crisis or storm: Secondary | ICD-10-CM | POA: Diagnosis not present

## 2017-12-15 DIAGNOSIS — M5430 Sciatica, unspecified side: Secondary | ICD-10-CM | POA: Diagnosis not present

## 2017-12-19 DIAGNOSIS — F411 Generalized anxiety disorder: Secondary | ICD-10-CM | POA: Diagnosis not present

## 2017-12-20 ENCOUNTER — Ambulatory Visit
Payer: Medicaid Other | Attending: Student in an Organized Health Care Education/Training Program | Admitting: Student in an Organized Health Care Education/Training Program

## 2017-12-20 ENCOUNTER — Other Ambulatory Visit: Payer: Self-pay

## 2017-12-20 ENCOUNTER — Encounter: Payer: Self-pay | Admitting: Student in an Organized Health Care Education/Training Program

## 2017-12-20 ENCOUNTER — Telehealth: Payer: Self-pay | Admitting: *Deleted

## 2017-12-20 VITALS — BP 111/81 | HR 103 | Temp 98.1°F | Resp 18 | Ht 67.0 in | Wt 175.0 lb

## 2017-12-20 DIAGNOSIS — M48062 Spinal stenosis, lumbar region with neurogenic claudication: Secondary | ICD-10-CM

## 2017-12-20 DIAGNOSIS — G894 Chronic pain syndrome: Secondary | ICD-10-CM | POA: Insufficient documentation

## 2017-12-20 DIAGNOSIS — J45909 Unspecified asthma, uncomplicated: Secondary | ICD-10-CM | POA: Insufficient documentation

## 2017-12-20 DIAGNOSIS — Z720 Tobacco use: Secondary | ICD-10-CM | POA: Insufficient documentation

## 2017-12-20 DIAGNOSIS — J449 Chronic obstructive pulmonary disease, unspecified: Secondary | ICD-10-CM | POA: Diagnosis not present

## 2017-12-20 DIAGNOSIS — I129 Hypertensive chronic kidney disease with stage 1 through stage 4 chronic kidney disease, or unspecified chronic kidney disease: Secondary | ICD-10-CM | POA: Diagnosis not present

## 2017-12-20 DIAGNOSIS — M5126 Other intervertebral disc displacement, lumbar region: Secondary | ICD-10-CM | POA: Diagnosis not present

## 2017-12-20 DIAGNOSIS — Z981 Arthrodesis status: Secondary | ICD-10-CM | POA: Diagnosis not present

## 2017-12-20 DIAGNOSIS — M5137 Other intervertebral disc degeneration, lumbosacral region: Secondary | ICD-10-CM

## 2017-12-20 DIAGNOSIS — N529 Male erectile dysfunction, unspecified: Secondary | ICD-10-CM | POA: Diagnosis not present

## 2017-12-20 DIAGNOSIS — Z9889 Other specified postprocedural states: Secondary | ICD-10-CM | POA: Diagnosis not present

## 2017-12-20 DIAGNOSIS — M961 Postlaminectomy syndrome, not elsewhere classified: Secondary | ICD-10-CM | POA: Diagnosis not present

## 2017-12-20 DIAGNOSIS — N189 Chronic kidney disease, unspecified: Secondary | ICD-10-CM | POA: Insufficient documentation

## 2017-12-20 DIAGNOSIS — Z79899 Other long term (current) drug therapy: Secondary | ICD-10-CM | POA: Diagnosis not present

## 2017-12-20 DIAGNOSIS — M5416 Radiculopathy, lumbar region: Secondary | ICD-10-CM | POA: Insufficient documentation

## 2017-12-20 DIAGNOSIS — K219 Gastro-esophageal reflux disease without esophagitis: Secondary | ICD-10-CM | POA: Insufficient documentation

## 2017-12-20 DIAGNOSIS — M545 Low back pain: Secondary | ICD-10-CM | POA: Diagnosis present

## 2017-12-20 MED ORDER — GABAPENTIN 600 MG PO TABS: ORAL_TABLET | ORAL | 2 refills | Status: DC

## 2017-12-20 NOTE — Patient Instructions (Signed)
Gabapentin escribed to pharmacy to be taken 600 in the a.m. 600 mg at noon and 1200 mg qhs.   MRI

## 2017-12-20 NOTE — Progress Notes (Signed)
Safety precautions to be maintained throughout the outpatient stay will include: orient to surroundings, keep bed in low position, maintain call bell within reach at all times, provide assistance with transfer out of bed and ambulation.  

## 2017-12-20 NOTE — Progress Notes (Signed)
Patient's Name: Ryan [name]  MRN: [id number]  Referring Provider: Letta Median, MD  DOB: [birth year]  [facility]  DOS: 12/20/2017  Note by: [name], MD  Service setting: Ambulatory outpatient  Specialty: Interventional Pain Management  Location: [facility] (AMB) Pain Management Facility  Visit type: Initial Patient Evaluation  Patient type: New Patient   Primary Reason(s) for Visit: Encounter for initial evaluation of one or more chronic problems (new to examiner) potentially causing chronic pain, and posing a threat to normal musculoskeletal function. (Level of risk: High) CC: New Patient (Initial Visit) and Back Pain  HPI  Ryan [name] is a 62 y.o. year old, male patient, who comes today to see [place] for the first time for an initial evaluation of his chronic pain. He has Airway hyperreactivity; Chronic LBP; ED (erectile dysfunction) of organic origin; BP (high blood pressure); Lower urinary tract symptoms; Temporary cerebral vascular dysfunction; Compulsive tobacco user syndrome; DDD (degenerative disc disease), lumbosacral; Spinal stenosis, lumbar region, with neurogenic claudication; Lumbar radiculopathy; and Lumbar herniated disc on their problem list. Today he comes in for evaluation of his New Patient (Initial Visit) and Back Pain  Pain Assessment: Location: Lower, Mid Back Radiating: Radiates down to right leg to ankle  Onset: More than a month ago Duration: Chronic pain Quality: Tingling, Constant, Burning, Aching, Nagging Severity: 7 /10 (subjective, self-reported pain score)  Note: Reported level is inconsistent with clinical observations. Clinically the patient looks like a 3/10 A 3/10 is viewed as "Moderate" and described as significantly interfering with activities of daily living (ADL). It becomes difficult to feed, bathe, get dressed, get on and off the toilet or to perform personal hygiene functions. Difficult to get in and out of bed or a chair  without assistance. Very distracting. With effort, it can be ignored when deeply involved in activities. Information on the proper use of the pain scale provided to the patient today. When using our objective Pain Scale, levels between 6 and 10/10 are said to belong in an emergency room, as it progressively worsens from a 6/10, described as severely limiting, requiring emergency care not usually available at an outpatient pain management facility. At a 6/10 level, communication becomes difficult and requires great effort. Assistance to reach the emergency department may be required. Facial flushing and profuse sweating along with potentially dangerous increases in heart rate and blood pressure will be evident. Effect on ADL: "Effects it very much. Difficult to sleep because of the pain. Hard to walk long distance. Very limiting"  Timing: Constant Modifying factors: "Gabapentin helps some with sleeping otherwise nothing really helps" BP: 111/81  HR: (!) 103  Onset and Duration: Gradual Cause of pain: Work related accident or event Severity: NAS-11 at its worse: 9/10, NAS-11 at its best: 6/10, NAS-11 now: 7/10 and NAS-11 on the average: 7/10 Timing: Not influenced by the time of the day Aggravating Factors: Bending, Kneeling, Lifiting, Squatting and Stooping  Alleviating Factors: Medications and Nerve blocks Associated Problems: Night-time cramps and Erectile dysfunction Quality of Pain: Aching, Agonizing, Annoying, Burning, Hot, Nagging, Pulsating, Throbbing, Tingling, Uncomfortable and Work related Previous Examinations or Tests: CT scan, Ct-Myelogram, Nerve block and X-rays Previous Treatments: Trigger point injections   The patient comes into the clinics today for the first time for a chronic pain management evaluation.   Patient presents today with a chief complaint of mid and low back pain that radiates down to the right posterior thigh and right ankle.  This is been  present for greater than  1 year.  Patient does have a history of spinal fusion at L5-S1.  Patient recently has had ED visits given worsening pain.  Patient does have a history of anxiety and is on chronic Xanax for that which is managed by patient's PCP.  Patient denies any bowel or bladder dysfunction.  He states that he has a difficult time performing ADLs.  Of note the patient was tearful during interview.  Today I took the time to provide the patient with information regarding my pain practice. The patient was informed that my practice is divided into two sections: an interventional pain management section, as well as a completely separate and distinct medication management section. I explained that I have procedure days for my interventional therapies, and evaluation days for follow-ups and medication management. Because of the amount of documentation required during both, they are kept separated. This means that there is the possibility that he may be scheduled for a procedure on one day, and medication management the next. I have also informed him that because of staffing and facility limitations, I no longer take patients for medication management only. To illustrate the reasons for this, I gave the patient the example of surgeons, and how inappropriate it would be to refer a patient to his/her care, just to write for the post-surgical antibiotics on a surgery done by a different surgeon.   Because interventional pain management is my board-certified specialty, the patient was informed that joining my practice means that they are open to any and all interventional therapies. I made it clear that this does not mean that they will be forced to have any procedures done. What this means is that I believe interventional therapies to be essential part of the diagnosis and proper management of chronic pain conditions. Therefore, patients not interested in these interventional alternatives will be better served under the care of a  different practitioner.  The patient was also made aware of my Comprehensive Pain Management Safety Guidelines where by joining my practice, they limit all of their nerve blocks and joint injections to those done by our practice, for as long as we are retained to manage their care.   Historic Controlled Substance Pharmacotherapy Review  PMP and historical list of controlled substances: Xanax, quantity 60, last filled 12/02/2017  Historical Monitoring: The patient  reports that he does not use drugs. List of all UDS Test(s): No results found for: MDMA, COCAINSCRNUR, [place], [place], CANNABQUANT, [place], [place] List of other Serum/Urine Drug Screening Test(s):  No results found for: AMPHSCRSER, BARBSCRSER, BENZOSCRSER, COCAINSCRSER, COCAINSCRNUR, PCPSCRSER, PCPQUANT, THCSCRSER, THCU, CANNABQUANT, OPIATESCRSER, OXYSCRSER, PROPOXSCRSER, ETH Historical Background Evaluation: [place] PMP: Six (6) year initial data search conducted.              [place] Department of public safety, offender search: Editor, commissioning Information) Positive for possession of controlled for substance in 1991 Risk Assessment Profile: Aberrant behavior: None observed or detected today Risk factors for fatal opioid overdose: concomitant use of Benzodiazepines Fatal overdose hazard ratio (HR): Calculation deferred Non-fatal overdose hazard ratio (HR): Calculation deferred Risk of opioid abuse or dependence: 0.7-3.0% with doses ? 36 MME/day and 6.1-26% with doses ? 120 MME/day. Substance use disorder (SUD) risk level: See below Personal History of Substance Abuse (SUD-Substance use disorder):  Alcohol: Negative  Illegal Drugs: Negative  Rx Drugs: Negative  ORT Risk Level calculation: Low Risk Opioid Risk Tool - 12/20/17 1218      Family History of Substance Abuse  Alcohol  Negative    Illegal Drugs  Negative    Rx Drugs  Negative      Personal History of Substance Abuse   Alcohol  Negative    Illegal Drugs  Negative    Rx Drugs   Negative      Age   Age between 19-45 years   No      Psychological Disease   Psychological Disease  Negative    Depression  Negative      Total Score   Opioid Risk Tool Scoring  0    Opioid Risk Interpretation  Low Risk      ORT Scoring interpretation table:  Score <3 = Low Risk for SUD  Score between 4-7 = Moderate Risk for SUD  Score >8 = High Risk for Opioid Abuse   PHQ-2 Depression Scale:  Total score: 1  PHQ-2 Scoring interpretation table: (Score and probability of major depressive disorder)  Score 0 = No depression  Score 1 = 15.4% Probability  Score 2 = 21.1% Probability  Score 3 = 38.4% Probability  Score 4 = 45.5% Probability  Score 5 = 56.4% Probability  Score 6 = 78.6% Probability   PHQ-9 Depression Scale:  Total score: 1  PHQ-9 Scoring interpretation table:  Score 0-4 = No depression  Score 5-9 = Mild depression  Score 10-14 = Moderate depression  Score 15-19 = Moderately severe depression  Score 20-27 = Severe depression (2.4 times higher risk of SUD and 2.89 times higher risk of overuse)   Pharmacologic Plan: No opioid analgesics.  Patient currently on Xanax            Initial impression: Poor candidate for opioid analgesics.  Meds   Current Outpatient Medications:  .  acetaminophen (TYLENOL) 325 MG tablet, Take 650 mg by mouth every 6 (six) hours as needed for mild pain or headache. , Disp: , Rfl:  .  albuterol (PROVENTIL HFA;VENTOLIN HFA) 108 (90 BASE) MCG/ACT inhaler, Inhale 1-2 puffs into the lungs every 6 (six) hours as needed for wheezing or shortness of breath. , Disp: , Rfl:  .  ALPRAZolam (XANAX) 1 MG tablet, Take 1 mg by mouth at bedtime as needed for anxiety., Disp: , Rfl:  .  cetirizine (ZYRTEC) 10 MG tablet, Take 10 mg by mouth daily., Disp: , Rfl:  .  cyclobenzaprine (FLEXERIL) 10 MG tablet, Take by mouth., Disp: , Rfl:  .  fluticasone (FLONASE) 50 MCG/ACT nasal spray, Place into both nostrils daily., Disp: , Rfl:  .  ketorolac (TORADOL)  10 MG tablet, Take 1 tablet (10 mg total) by mouth every 6 (six) hours as needed., Disp: 20 tablet, Rfl: 0 .  lisinopril (PRINIVIL,ZESTRIL) 20 MG tablet, Take 10 mg by mouth daily., Disp: , Rfl:  .  Multiple Vitamin (MULTIVITAMIN) tablet, Take 1 tablet by mouth daily., Disp: , Rfl:  .  omeprazole (PRILOSEC) 40 MG capsule, Take 40 mg by mouth daily., Disp: , Rfl:  .  tamsulosin (FLOMAX) 0.4 MG CAPS capsule, Take 0.8 mg by mouth daily. , Disp: , Rfl:  .  gabapentin (NEURONTIN) 600 MG tablet, 600 mg qAM, 600 mg qPM, 1200 mg qhs, Disp: 90 tablet, Rfl: 2  Imaging Review  Lumbar DG 2-3 views:  Results for orders placed during the hospital encounter of 11/22/17  DG Lumbar Spine 2-3 Views   Narrative CLINICAL DATA:  Low back pain with radiculopathy.  EXAM: LUMBAR SPINE - 2-3 VIEW  COMPARISON:  CT abdomen pelvis 10/19/2017  FINDINGS:  Mild anterolisthesis L4-5. Remaining alignment normal. Negative for fracture  Advanced disc degeneration L4-5 with disc space narrowing, endplate sclerosis and spurring similar to the prior study. Pedicle screw and interbody fusion L5-S1. Question pseudarthrosis.  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  Mild anterolisthesis L3-4  Pedicle screw interbody fusion L5-S1.  Question pseudarthrosis.   Electronically Signed   By: Franchot Gallo M.D.   On: 11/22/2017 11:01     Results for orders placed during the hospital encounter of 04/19/14  DG MYELOGRAPHY LUMBAR INJ LUMBOSACRAL   Narrative CLINICAL DATA:  62 year old male with severe back pain. Displacement of lumbar intervertebral disc.  EXAM: LUMBAR MYELOGRAM  FLUOROSCOPY TIME:  1 minutes and 30 seconds  PROCEDURE: After thorough discussion of risks and benefits of the procedure including bleeding, infection, injury to nerves, blood vessels, adjacent structures as well as headache and CSF leak, written and oral informed consent was obtained. Consent was obtained by Dr. Genevie Ann. Time out form  was completed.  Patient was positioned prone on the fluoroscopy table. Local anesthesia was provided with 1% lidocaine without epinephrine after prepped and draped in the usual sterile fashion. Puncture was performed at L2-L3 using a 3 1/2 inch 22-gauge spinal needle via right sub laminar approach. Using a single pass through the dura, the needle was placed within the thecal sac, with return of clear CSF. 15 mL of Omnipaque-180 was injected into the thecal sac, with normal opacification of the nerve roots and cauda equina consistent with free flow within the subarachnoid space.  I personally performed the lumbar puncture and administered the intrathecal contrast. I also personally supervised acquisition of the myelogram images.  As the lumbar puncture was just getting underway, the patient experienced acute and persistent right calf muscle cramps - which he reports often occurs while prone, and that he consequently always avoids laying on his stomach. When the symptoms did not relent despite repositioning and massage, he was treated with a single intramuscular injection of 75 mg Demerol, and improved.  TECHNIQUE: Contiguous axial images were obtained through the Lumbar spine after the intrathecal infusion of infusion. Coronal and sagittal reconstructions were obtained of the axial image sets.  COMPARISON:  [facility] lumbar MRI 04/23/2013, and earlier.  FINDINGS: LUMBAR MYELOGRAM FINDINGS:  There is normal lumbar segmentation, corresponding to the numbering system in 2015.  Effacement of contrast from the thecal sac at L3-L4. In the lateral and upright positions there is ventral mass effect on the thecal sac at L3-L4, which progresses to severe when upright. There is mild grade 1 anterolisthesis at that level which does mildly increase with flexion and decrease with extension.  There is also severe disc space loss at L4-L5 with endplate spurring,  sclerosis, and evidence of vacuum disc. There is a mild to moderate defect on the ventral thecal sac at that level. No abnormal me motion at that level with flexion extension. No other abnormal motion identified.  CT LUMBAR MYELOGRAM FINDINGS:  Stable vertebral height and alignment since 2015. Visible sacrum and SI joints intact. Staver round lucent areas in the medial right iliac bone are stable since 07/15/2011 (series 4, image 83 and image 87 on the right).  Negative visualized costophrenic angles. Punctate left nephrolithiasis. Chronic Aortoiliac calcified atherosclerosis noted.  Normal myelographic appearance of the lower thoracic spinal cord, conus at L1.  T11-T12:  Partially visible, grossly negative.  T12-L1:  Negative.  L1-L2:  Negative.  L2-L3: Stable trace retrolisthesis. Stable mild disc bulge. Stable mild facet  hypertrophy. No significant stenosis.  L3-L4: Mild disc space loss and circumferential disc bulge. Moderate to severe facet and moderate ligament flavum hypertrophy. Mild spinal stenosis and left lateral recess stenosis. Mild left greater than right L3 foraminal stenosis. This level appears stable since 2015.  L4-L5: Chronic severe disc space loss with vacuum disc and bulky circumferential disc osteophyte complex. Mild to moderate facet hypertrophy. Borderline to mild lateral recess stenosis primarily on the left. No significant spinal stenosis. Moderate left and mild right L4 foraminal stenosis.  L5-S1: Disc space loss and bulky right eccentric circumferential disc osteophyte complex. Increased density in the proximal right L5 neural foramen, suspicious for disc herniation and possible sequestered disc fragment. Just below the disc space level there is moderate right lateral recess stenosis. No significant spinal stenosis. Mild to moderate left L5 foraminal stenosis is stable.  IMPRESSION: LUMBAR MYELOGRAM IMPRESSION:  1. Abnormal motion at L3-L4  where there is grade 1 anterolisthesis in the neutral position. Upright positioning also a exacerbates spinal stenosis at that level, which appears severe on upright myelographic images. 2. No other abnormal lumbar motion identified.  CT LUMBAR MYELOGRAM IMPRESSION:  1. Severe chronic disc and endplate degeneration at L4-L5 with mild left lateral recess and moderate left L4 foraminal stenosis. 2. Severe right L5-S1 neural foraminal stenosis suspected due to chronic disc herniation and possible sequestered fragment. Query right L5 radiculitis. Just below the disc space there is also moderate right lateral recess stenosis at the descending right S1 nerve root level. 3. Multifactorial spinal stenosis at L3-L4 appears mild on prone postmyelogram CT imaging. There is also mild left lateral recess and left greater than right L3 foraminal stenosis. 4. Punctate left nephrolithiasis. Chronic aortoiliac calcified atherosclerosis.   Electronically Signed   By: Genevie Ann M.D.   On: 04/19/2014 15:54      Complexity Note: Imaging results reviewed. Results shared with Ryan [name], using Layman's terms.                         ROS  Cardiovascular: High blood pressure Pulmonary or Respiratory: Smoking Neurological: No reported neurological signs or symptoms such as seizures, abnormal skin sensations, urinary and/or fecal incontinence, being born with an abnormal open spine and/or a tethered spinal cord Review of Past Neurological Studies: No results found for this or any previous visit. Psychological-Psychiatric: Anxiousness and Depressed Gastrointestinal: No reported gastrointestinal signs or symptoms such as vomiting or evacuating blood, reflux, heartburn, alternating episodes of diarrhea and constipation, inflamed or scarred liver, or pancreas or irrregular and/or infrequent bowel movements Genitourinary: Passing kidney stones Hematological: No reported hematological signs or symptoms such as  prolonged bleeding, low or poor functioning platelets, bruising or bleeding easily, hereditary bleeding problems, low energy levels due to low hemoglobin or being anemic Endocrine: No reported endocrine signs or symptoms such as high or low blood sugar, rapid heart rate due to high thyroid levels, obesity or weight gain due to slow thyroid or thyroid disease Rheumatologic: No reported rheumatological signs and symptoms such as fatigue, joint pain, tenderness, swelling, redness, heat, stiffness, decreased range of motion, with or without associated rash Musculoskeletal: Negative for myasthenia gravis, muscular dystrophy, multiple sclerosis or malignant hyperthermia Work History: Disabled  Allergies  Ryan [name] has No Known Allergies.  Laboratory Chemistry  Inflammation Markers (CRP: Acute Phase) (ESR: Chronic Phase) No results found for: CRP, ESRSEDRATE, LATICACIDVEN  Rheumatology Markers No results found for: RF, ANA, LABURIC, URICUR, LYMEIGGIGMAB, LYMEABIGMQN, HLAB27                      Renal Function Markers Lab Results  Component Value Date   BUN 22 10/19/2017   CREATININE 0.95 10/19/2017   GFRAA >60 10/19/2017   GFRNONAA >60 10/19/2017                             Hepatic Function Markers Lab Results  Component Value Date   AST 18 10/19/2017   ALT 17 10/19/2017   ALBUMIN 4.2 10/19/2017   ALKPHOS 104 10/19/2017   LIPASE 77 (H) 10/19/2017                        Electrolytes Lab Results  Component Value Date   NA 140 10/19/2017   K 4.9 10/19/2017   CL 107 10/19/2017   CALCIUM 9.5 10/19/2017                        Neuropathy Markers No results found for: VITAMINB12, FOLATE, HGBA1C, HIV                      CNS Tests No results found for: COLORCSF, APPEARCSF, RBCCOUNTCSF, WBCCSF, POLYSCSF, LYMPHSCSF, EOSCSF, PROTEINCSF, GLUCCSF, JCVIRUS, CSFOLI, IGGCSF                      Bone Pathology Markers No results found for: VD25OH, [id number],  [id number], [id number], 25OHVITD1, 25OHVITD2, 25OHVITD3, TESTOFREE, TESTOSTERONE                       Coagulation Parameters Lab Results  Component Value Date   PLT 231 10/19/2017                        Cardiovascular Markers Lab Results  Component Value Date   TROPONINI <0.03 09/08/2017   HGB 16.0 10/19/2017   HCT 46.3 10/19/2017                         CA Markers No results found for: CEA, CA125, LABCA2                      Note: Lab results reviewed.  [place]  Drug: Ryan [name]  reports that he does not use drugs. Alcohol:  reports that he does not drink alcohol. Tobacco:  reports that he has been smoking cigarettes. He has a 60.00 pack-year smoking history. He has never used smokeless tobacco. Medical:  has a past medical history of Arthritis, Asthma, Chronic kidney disease, COPD (chronic obstructive pulmonary disease) ([place]), Dyspnea, GERD (gastroesophageal reflux disease), Hyperlipidemia, Hypertension, Lower urinary tract symptoms (LUTS), Wears dentures, and Wears hearing aid. Family: family history includes Arthritis in his mother; Diabetes in his father; Hypertension in his father and mother.  Past Surgical History:  Procedure Laterality Date  . BACK SURGERY    . CATARACT EXTRACTION W/PHACO Left 01/06/2016   Procedure: CATARACT EXTRACTION PHACO AND INTRAOCULAR LENS PLACEMENT (IOC);  Surgeon: Eulogio Bear, MD;  Location: [place];  Service: Ophthalmology;  Laterality: Left;  LEFT  . COLONOSCOPY WITH PROPOFOL N/A 04/19/2016   Procedure: COLONOSCOPY WITH PROPOFOL;  Surgeon: Manya Silvas, MD;  Location[facility] END[facility]COPY;  Service: Endoscopy;  Laterality: N/A;  . ESOPHAGOGASTRODUODENOSCOPY (EGD) WITH PROPOFOL N/A 10/24/2015   Procedure: ESOPHAGOGASTRODUODENOSCOPY (EGD) WITH PROPOFOL;  Surgeon: Manya Silvas, MD;  Location[facility] ENDOSCOPY;  Service: Endoscopy;  Laterality: N/A;  . EYE SURGERY    . HERNIA REPAIR     ERRONEOUS UMBILICAL HERNIA   [id number]  . MYRINGOTOMY  WITH TUBE PLACEMENT Bilateral 03/28/2015   Procedure: MYRINGOTOMY WITH TUBE PLACEMENT;  Surgeon: Beverly Gust, MD;  Location: [place];  Service: ENT;  Laterality: Bilateral;  BUTTERFLY TUBE  . TONSILLECTOMY    . TRANSFORAMINAL LUMBAR INTERBODY FUSION (TLIF) WITH PEDICLE SCREW FIXATION 1 LEVEL Right 06/18/2014   Procedure: TRANSFORAMINAL LUMBAR INTERBODY FUSION (TLIF) WITH PEDICLE SCREW FIXATION 1 LEVEL LUMBAR 5 -SACRAL 1;  Surgeon: Karie Chimera, MD;  Location: [place] NEURO ORS;  Service: Neurosurgery;  Laterality: Right;  Right transforaminal lumbar interbody fusion with interbody prosthesis and percutaneous pedicle screws Lumbar 5 to Sacral 1   Active Ambulatory Problems    Diagnosis Date Noted  . Airway hyperreactivity 04/19/2014  . Chronic LBP 04/19/2014  . 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 BP (high blood pressure) 04/19/2014  . Lower urinary tract symptoms 12/27/2012  . Temporary cerebral vascular dysfunction 06/06/2013  . Compulsive tobacco user syndrome 05/22/2013  . DDD (degenerative disc disease), lumbosacral 06/02/2014  . Spinal stenosis, lumbar region, with neurogenic claudication 06/02/2014  . Lumbar radiculopathy 06/02/2014  . Lumbar herniated disc 06/18/2014   Resolved Ambulatory Problems    Diagnosis Date Noted  . No Resolved Ambulatory Problems   Past Medical History:  Diagnosis Date  . Arthritis   . Asthma   . Chronic kidney disease   . COPD (chronic obstructive pulmonary disease) ([place])   . Dyspnea   . GERD (gastroesophageal reflux disease)   . Hyperlipidemia   . Hypertension   . Lower urinary tract symptoms (LUTS)   . Wears dentures   . Wears hearing aid    Constitutional Exam  General appearance: alert, cooperative and in mild distress Vitals:   12/20/17 1210  BP: 111/81  Pulse: (!) 103  Resp: 18  Temp: 98.1 F (36.7 C)  SpO2: 97%  Weight: 175 lb (79.4 kg)  Height: '5\' 7"'  (1.702 m)   BMI Assessment: Estimated body  mass index is 27.41 kg/m as calculated from the following:   Height as of this encounter: '5\' 7"'  (1.702 m).   Weight as of this encounter: 175 lb (79.4 kg).  BMI interpretation table: BMI level Category Range association with higher incidence of chronic pain  <18 kg/m2 Underweight   18.5-24.9 kg/m2 Ideal body weight   25-29.9 kg/m2 Overweight Increased incidence by 20%  30-34.9 kg/m2 Obese (Class I) Increased incidence by 68%  35-39.9 kg/m2 Severe obesity (Class II) Increased incidence by 136%  >40 kg/m2 Extreme obesity (Class III) Increased incidence by 254%   Patient's current BMI Ideal Body weight  Body mass index is 27.41 kg/m. Ideal body weight: 66.1 kg (145 lb 11.6 oz) Adjusted ideal body weight: 71.4 kg (157 lb 6.9 oz)   BMI Readings from Last 4 Encounters:  12/20/17 27.41 kg/m  12/09/17 27.41 kg/m  12/01/17 27.28 kg/m  11/27/17 27.25 kg/m   Wt Readings from Last 4 Encounters:  12/20/17 175 lb (79.4 kg)  12/09/17 175 lb (79.4 kg)  12/01/17 174 lb 2.6 oz (79 kg)  11/27/17 174 lb (78.9 kg)  Psych/Mental status: Alert, oriented x 3 (person, place, & time)  Eyes: PERLA Respiratory: No evidence of acute respiratory distress  Cervical Spine Area Exam  Skin & Axial Inspection: No masses, redness, edema, swelling, or associated skin lesions Alignment: Symmetrical Functional ROM: Unrestricted ROM      Stability: No instability detected Muscle Tone/Strength: Functionally intact. No obvious neuro-muscular anomalies detected. Sensory (Neurological): Unimpaired Palpation: No palpable anomalies              Upper Extremity (UE) Exam    Side: Right upper extremity  Side: Left upper extremity  Skin & Extremity Inspection: Skin color, temperature, and hair growth are WNL. No peripheral edema or cyanosis. No masses, redness, swelling, asymmetry, or associated skin lesions. No contractures.  Skin & Extremity Inspection: Skin color, temperature, and hair growth are WNL. No  peripheral edema or cyanosis. No masses, redness, swelling, asymmetry, or associated skin lesions. No contractures.  Functional ROM: Unrestricted ROM          Functional ROM: Unrestricted ROM          Muscle Tone/Strength: Functionally intact. No obvious neuro-muscular anomalies detected.  Muscle Tone/Strength: Functionally intact. No obvious neuro-muscular anomalies detected.  Sensory (Neurological): Unimpaired          Sensory (Neurological): Unimpaired          Palpation: No palpable anomalies              Palpation: No palpable anomalies              Provocative Test(s):  Phalen's test: deferred Tinel's test: deferred Apley's scratch test (touch opposite shoulder):  Action 1 (Across chest): deferred Action 2 (Overhead): deferred Action 3 (LB reach): deferred   Provocative Test(s):  Phalen's test: deferred Tinel's test: deferred Apley's scratch test (touch opposite shoulder):  Action 1 (Across chest): deferred Action 2 (Overhead): deferred Action 3 (LB reach): deferred    Thoracic Spine Area Exam  Skin & Axial Inspection: No masses, redness, or swelling Alignment: Symmetrical Functional ROM: Unrestricted ROM Stability: No instability detected Muscle Tone/Strength: Functionally intact. No obvious neuro-muscular anomalies detected. Sensory (Neurological): Unimpaired Muscle strength & Tone: No palpable anomalies  Lumbar Spine Area Exam  Skin & Axial Inspection: Well healed scar from previous spine surgery detected Alignment: Symmetrical Functional ROM: Decreased ROM affecting primarily the right Stability: No instability detected Muscle Tone/Strength: Functionally intact. No obvious neuro-muscular anomalies detected. Sensory (Neurological): Dermatomal pain pattern Palpation: No palpable anomalies       Provocative Tests: Hyperextension/rotation test: (+) due to fusion restriction. Lumbar quadrant test (Kemp's test): (+) due to fusion restriction. Lateral bending test:  deferred today       Patrick's Maneuver: deferred today                   FABER test: deferred today                   S-I anterior distraction/compression test: deferred today         S-I lateral compression test: deferred today         S-I Thigh-thrust test: deferred today         S-I Gaenslen's test: deferred today          Gait & Posture Assessment  Ambulation: Limited Gait: Antalgic Posture: Difficulty standing up straight, due to pain   Lower Extremity Exam    Side: Right lower extremity  Side: Left lower extremity  Stability: No instability observed          Stability: No instability observed  Skin & Extremity Inspection: Skin color, temperature, and hair growth are WNL. No peripheral edema or cyanosis. No masses, redness, swelling, asymmetry, or associated skin lesions. No contractures.  Skin & Extremity Inspection: Skin color, temperature, and hair growth are WNL. No peripheral edema or cyanosis. No masses, redness, swelling, asymmetry, or associated skin lesions. No contractures.  Functional ROM: Decreased ROM for hip and knee joints          Functional ROM: Unrestricted ROM                  Muscle Tone/Strength: L5 myotomal (EHL) weakness (Toe Dorsiflexion)  Muscle Tone/Strength: Functionally intact. No obvious neuro-muscular anomalies detected.  Sensory (Neurological): Dermatomal pain pattern at L4 and L5        Sensory (Neurological): Unimpaired        DTR: Patellar: 1+: trace Achilles: 1+: trace Plantar: deferred today  DTR: Patellar: deferred today Achilles: deferred today Plantar: deferred today  Palpation: No palpable anomalies  Palpation: No palpable anomalies   Assessment  Primary Diagnosis & Pertinent Problem List: The primary encounter diagnosis was Status post lumbar surgery. Diagnoses of History of lumbar fusion, Failed back surgical syndrome, Spinal stenosis, lumbar region, with neurogenic claudication, Lumbar radiculopathy, Lumbar herniated disc, DDD  (degenerative disc disease), lumbosacral, and Chronic pain syndrome were also pertinent to this visit.  Visit Diagnosis (New problems to examiner): 1. Status post lumbar surgery   2. History of lumbar fusion   3. Failed back surgical syndrome   4. Spinal stenosis, lumbar region, with neurogenic claudication   5. Lumbar radiculopathy   6. Lumbar herniated disc   7. DDD (degenerative disc disease), lumbosacral   8. Chronic pain syndrome    62 year old male with a history of L5-S1 lumbar spine fusion who presents with worsening axial low back pain with radiation down to his right lower extremity to his ankle in a dermatomal fashion.  Patient has had lumbar spine x-rays done which showed advanced disc degeneration at L4-L5, mild anterolisthesis of L3-L4 along with possible pseudoarthrosis.  To further delineate lumbar spine pathology, I recommend lumbar MRI without contrast.  Depending upon results from lumbar MRI, we can discuss interventional pain plan.  We will focus on interventional treatments.  Patient is on chronic Xanax therapy which precludes him from opioid medications chronically.  Furthermore patient is to be considered for chronic opioid therapy, will need to discontinue Xanax and need psych eval for chronic opioid therapy.  Pending MRI results, treatment options could include lumbar versus caudal ESI or lumbar facet medial branch nerve blocks.  For his radicular pain, recommend the patient start gabapentin as below.  Kidney function within normal limits.  Future considerations could include Lyrica or Cymbalta.  Would avoid Topamax given history of nephrolithiasis.   Ordered Lab-work, Procedure(s), Referral(s), & Consult(s): Orders Placed This Encounter  Procedures  . MR LUMBAR SPINE WO CONTRAST  . Compliance Drug Analysis, Ur   Pharmacotherapy (current): Medications ordered:  Meds ordered this encounter  Medications  . gabapentin (NEURONTIN) 600 MG tablet    Sig: 600 mg qAM,  600 mg qPM, 1200 mg qhs    Dispense:  90 tablet    Refill:  2    Do not place this medication, or any other prescription from our practice, on "Automatic Refill". Patient may have prescription filled one day early if pharmacy is closed on scheduled refill date.   Medications administered during this visit: Chiquita Loth Micheli had no medications administered during this visit.  Interventional management options: Ryan [name] was informed that there is no guarantee that he would be a candidate for interventional therapies. The decision will be based on the results of diagnostic studies, as well as Ryan [name] risk profile.  Procedure(s) under consideration:  Lumbar epidural steroid injection Caudal Lumbar facet medial branch nerve blocks   Provider-requested follow-up: Return in about 3 weeks (around 01/10/2018) for After Imaging.  Future Appointments  Date Time Provider [place]  01/04/2018  2:15 PM [name], MD [facility] None    Primary Care Physician: [facility] [place] Location: [facility] Note by: [name], M.D, Date: 12/20/2017; Time: 2:53 PM  Patient Instructions  Gabapentin escribed to pharmacy to be taken 600 in the a.m. 600 mg at noon and 1200 mg qhs.   MRI

## 2018-01-03 DIAGNOSIS — M5431 Sciatica, right side: Secondary | ICD-10-CM | POA: Diagnosis not present

## 2018-01-04 ENCOUNTER — Encounter: Payer: Medicaid Other | Admitting: Student in an Organized Health Care Education/Training Program

## 2018-01-06 DIAGNOSIS — J441 Chronic obstructive pulmonary disease with (acute) exacerbation: Secondary | ICD-10-CM | POA: Diagnosis not present

## 2018-01-09 DIAGNOSIS — F411 Generalized anxiety disorder: Secondary | ICD-10-CM | POA: Diagnosis not present

## 2018-01-10 ENCOUNTER — Ambulatory Visit
Admission: RE | Admit: 2018-01-10 | Discharge: 2018-01-10 | Disposition: A | Discharge status: Home / Self Care | Payer: Medicaid Other | Source: Ambulatory Visit | Attending: Student in an Organized Health Care Education/Training Program | Admitting: Student in an Organized Health Care Education/Training Program

## 2018-01-10 DIAGNOSIS — Z9889 Other specified postprocedural states: Secondary | ICD-10-CM | POA: Diagnosis not present

## 2018-01-10 DIAGNOSIS — M48061 Spinal stenosis, lumbar region without neurogenic claudication: Secondary | ICD-10-CM | POA: Insufficient documentation

## 2018-01-10 DIAGNOSIS — M5126 Other intervertebral disc displacement, lumbar region: Secondary | ICD-10-CM | POA: Insufficient documentation

## 2018-01-10 DIAGNOSIS — M545 Low back pain: Secondary | ICD-10-CM | POA: Diagnosis not present

## 2018-01-12 ENCOUNTER — Other Ambulatory Visit: Payer: Self-pay

## 2018-01-12 ENCOUNTER — Encounter: Payer: Self-pay | Admitting: Student in an Organized Health Care Education/Training Program

## 2018-01-12 ENCOUNTER — Ambulatory Visit
Payer: Medicaid Other | Attending: Student in an Organized Health Care Education/Training Program | Admitting: Student in an Organized Health Care Education/Training Program

## 2018-01-12 VITALS — BP 138/93 | HR 77 | Temp 98.1°F | Ht 67.0 in | Wt 175.0 lb

## 2018-01-12 DIAGNOSIS — Z981 Arthrodesis status: Secondary | ICD-10-CM

## 2018-01-12 DIAGNOSIS — I129 Hypertensive chronic kidney disease with stage 1 through stage 4 chronic kidney disease, or unspecified chronic kidney disease: Secondary | ICD-10-CM | POA: Insufficient documentation

## 2018-01-12 DIAGNOSIS — M5116 Intervertebral disc disorders with radiculopathy, lumbar region: Secondary | ICD-10-CM | POA: Insufficient documentation

## 2018-01-12 DIAGNOSIS — M5137 Other intervertebral disc degeneration, lumbosacral region: Secondary | ICD-10-CM | POA: Diagnosis not present

## 2018-01-12 DIAGNOSIS — M4316 Spondylolisthesis, lumbar region: Secondary | ICD-10-CM | POA: Insufficient documentation

## 2018-01-12 DIAGNOSIS — K219 Gastro-esophageal reflux disease without esophagitis: Secondary | ICD-10-CM | POA: Insufficient documentation

## 2018-01-12 DIAGNOSIS — G8929 Other chronic pain: Secondary | ICD-10-CM | POA: Diagnosis not present

## 2018-01-12 DIAGNOSIS — M48062 Spinal stenosis, lumbar region with neurogenic claudication: Secondary | ICD-10-CM | POA: Diagnosis not present

## 2018-01-12 DIAGNOSIS — Z9889 Other specified postprocedural states: Secondary | ICD-10-CM | POA: Diagnosis not present

## 2018-01-12 DIAGNOSIS — M1990 Unspecified osteoarthritis, unspecified site: Secondary | ICD-10-CM | POA: Insufficient documentation

## 2018-01-12 DIAGNOSIS — E785 Hyperlipidemia, unspecified: Secondary | ICD-10-CM | POA: Insufficient documentation

## 2018-01-12 DIAGNOSIS — N189 Chronic kidney disease, unspecified: Secondary | ICD-10-CM | POA: Insufficient documentation

## 2018-01-12 DIAGNOSIS — M961 Postlaminectomy syndrome, not elsewhere classified: Secondary | ICD-10-CM

## 2018-01-12 DIAGNOSIS — F17210 Nicotine dependence, cigarettes, uncomplicated: Secondary | ICD-10-CM | POA: Diagnosis not present

## 2018-01-12 DIAGNOSIS — N390 Urinary tract infection, site not specified: Secondary | ICD-10-CM | POA: Insufficient documentation

## 2018-01-12 DIAGNOSIS — J449 Chronic obstructive pulmonary disease, unspecified: Secondary | ICD-10-CM | POA: Diagnosis not present

## 2018-01-12 DIAGNOSIS — Z79899 Other long term (current) drug therapy: Secondary | ICD-10-CM | POA: Diagnosis not present

## 2018-01-12 DIAGNOSIS — Z8249 Family history of ischemic heart disease and other diseases of the circulatory system: Secondary | ICD-10-CM | POA: Diagnosis not present

## 2018-01-12 DIAGNOSIS — M5416 Radiculopathy, lumbar region: Secondary | ICD-10-CM

## 2018-01-12 DIAGNOSIS — N528 Other male erectile dysfunction: Secondary | ICD-10-CM | POA: Insufficient documentation

## 2018-01-12 DIAGNOSIS — Z791 Long term (current) use of non-steroidal anti-inflammatories (NSAID): Secondary | ICD-10-CM | POA: Insufficient documentation

## 2018-01-12 DIAGNOSIS — M546 Pain in thoracic spine: Secondary | ICD-10-CM | POA: Diagnosis present

## 2018-01-12 NOTE — Progress Notes (Signed)
Patient's Name: Ryan [name]  MRN: [id number]  Referring Provider: [facility], [facility]  DOB: May 07, 1955  PCP: [facility], [place]  DOS: 01/12/2018  Note by: [name], MD  Service setting: Ambulatory outpatient  Specialty: Interventional Pain Management  Location: [facility] (AMB) Pain Management Facility    Patient type: Established   Primary Reason(s) for Visit: Evaluation of chronic illnesses with exacerbation, or progression (Level of risk: moderate) CC: Back Pain  HPI  Mr. Ryan [name] is a 62 y.o. year old, male patient, who comes today for a follow-up evaluation. He has Airway hyperreactivity; Chronic LBP; ED (erectile dysfunction) of organic origin; BP (high blood pressure); Lower urinary tract symptoms; Temporary cerebral vascular dysfunction; Compulsive tobacco user syndrome; DDD (degenerative disc disease), lumbosacral; Spinal stenosis, lumbar region, with neurogenic claudication; Lumbar radiculopathy; and Lumbar herniated disc on their problem list. Mr. Ryan [name] was last seen on 12/20/2017. His primarily concern today is the Back Pain  Pain Assessment: Location: Lower, Mid Back Radiating: radiaties down right leg down to foot Onset: More than a month ago Duration: Chronic pain Quality: Constant, Aching, Burning, Nagging, Tingling Severity: 6 /10 (subjective, self-reported pain score)  Note: Reported level is inconsistent with clinical observations.                         When using our objective Pain Scale, levels between 6 and 10/10 are said to belong in an emergency room, as it progressively worsens from a 6/10, described as severely limiting, requiring emergency care not usually available at an outpatient pain management facility. At a 6/10 level, communication becomes difficult and requires great effort. Assistance to reach the emergency department may be required. Facial flushing and profuse sweating along with potentially dangerous increases in heart rate and blood  pressure will be evident. Effect on ADL: hard to walk long distant, no prolonged standing, very hard to do anything Timing: Constant Modifying factors: injections, Gabapentin helps me sleep BP: (!) 138/93  HR: 77  Further details on both, my assessment(s), as well as the proposed treatment plan, please see below.  Follows up today status post lumbar MRI.  In the interim, patient has been evaluated by neurology.  They recommended EMG studies of upper and lower extremity however the patient wants to hold off.  Laboratory Chemistry  Inflammation Markers (CRP: Acute Phase) (ESR: Chronic Phase) No results found for: CRP, ESRSEDRATE, LATICACIDVEN                       Rheumatology Markers No results found for: RF, ANA, LABURIC, URICUR, LYMEIGGIGMAB, LYMEABIGMQN, HLAB27                      Renal Function Markers Lab Results  Component Value Date   BUN 22 10/19/2017   CREATININE 0.95 10/19/2017   GFRAA >60 10/19/2017   GFRNONAA >60 10/19/2017                             Hepatic Function Markers Lab Results  Component Value Date   AST 18 10/19/2017   ALT 17 10/19/2017   ALBUMIN 4.2 10/19/2017   ALKPHOS 104 10/19/2017   LIPASE 77 (H) 10/19/2017                        Electrolytes Lab Results  Component Value Date   NA 140 10/19/2017  K 4.9 10/19/2017   CL 107 10/19/2017   CALCIUM 9.5 10/19/2017                          Coagulation Parameters Lab Results  Component Value Date   PLT 231 10/19/2017                        Cardiovascular Markers Lab Results  Component Value Date   TROPONINI <0.03 09/08/2017   HGB 16.0 10/19/2017   HCT 46.3 10/19/2017                         CA Markers No results found for: CEA, CA125, LABCA2                      Note: Lab results reviewed.  Recent Diagnostic Imaging Review   Lumbosacral Imaging: Lumbar MR wo contrast:  Results for orders placed during the hospital encounter of 01/10/18  MR LUMBAR SPINE WO CONTRAST    Narrative CLINICAL DATA:  Previous L5-S1 diskectomy and fusion. Low back pain radiating to the right leg with numbness and tingling over the last 2-3 months.  EXAM: MRI LUMBAR SPINE WITHOUT CONTRAST  TECHNIQUE: Multiplanar, multisequence MR imaging of the lumbar spine was performed. No intravenous contrast was administered.  COMPARISON:  Radiography 11/22/2017. CT 10/19/2017. MRI 04/23/2013.  FINDINGS: Segmentation:  5 lumbar type vertebral bodies.  Alignment:  4 mm anterolisthesis L3-4.  4 mm retrolisthesis L4-5.  Vertebrae: Chronic discogenic marrow changes in the lower lumbar spine. No acute bone finding.  Conus medullaris and cauda equina: Conus extends to the L1 level. Conus and cauda equina appear normal.  Paraspinal and other soft tissues: Negative  Disc levels:  No significant finding at T12-L1 or L1-2.  L2-3: Worsening of disease at this level with a circumferential protrusion of the disc more prominent towards the right. This indents the thecal sac. There is mild facet and ligamentous hypertrophy. Lateral recess narrowing and foraminal narrowing right more than left. Definite neural compression is not established, but findings at this level could be symptomatic.  L3-4: Bilateral facet arthropathy with 4 mm of anterolisthesis. Bulging of the disc. Stenosis of both lateral recesses, which could cause neural compression. Slight worsening since 2015.  L4-5: 4 mm of retrolisthesis. Chronic disc degeneration with loss of disc height. Endplate osteophytes and minor disc bulging. Mild lateral recess narrowing without visible neural compression. Mild bilateral foraminal narrowing without visible compression of the exiting L4 nerves.  L5-S1: Interval posterior decompression, diskectomy and fusion procedure. No complicating feature. Wide patency of the canal. Chronic foraminal narrowing on the right that would have some potential to affect the exiting L5  nerve.  IMPRESSION: Newly seen circumferential disc protrusion at L2-3 more prominent towards the right. Mild facet and ligamentous hypertrophy. Narrowing of the lateral recesses and foramina right more than left. Definite neural compression is not established, but there is some potential that right-sided nerve compression or irritation could occur at this level.  Slight worsening of bilateral lateral recess stenosis at L3-4 that could possibly be symptomatic on either side.  Similar appearance of bilateral lateral recess and foraminal narrowing at L4-5 but without likely neural compression.  Previous decompression and fusion at L5-S1. Good patency of the central canal. Some foraminal narrowing on the right which could affect the right L5 nerve.   Electronically Signed   By: Elta Guadeloupe  Shogry M.D.   On: 01/10/2018 11:05     Lumbar CT w contrast:  Results for orders placed during the hospital encounter of 04/19/14  CT Lumbar Spine W Contrast   Narrative CLINICAL DATA:  62 year old male with severe back pain. Displacement of lumbar intervertebral disc.  EXAM: LUMBAR MYELOGRAM  FLUOROSCOPY TIME:  1 minutes and 30 seconds  PROCEDURE: After thorough discussion of risks and benefits of the procedure including bleeding, infection, injury to nerves, blood vessels, adjacent structures as well as headache and CSF leak, written and oral informed consent was obtained. Consent was obtained by Dr. Genevie Ann. Time out form was completed.  Patient was positioned prone on the fluoroscopy table. Local anesthesia was provided with 1% lidocaine without epinephrine after prepped and draped in the usual sterile fashion. Puncture was performed at L2-L3 using a 3 1/2 inch 22-gauge spinal needle via right sub laminar approach. Using a single pass through the dura, the needle was placed within the thecal sac, with return of clear CSF. 15 mL of Omnipaque-180 was injected into the thecal sac,  with normal opacification of the nerve roots and cauda equina consistent with free flow within the subarachnoid space.  I personally performed the lumbar puncture and administered the intrathecal contrast. I also personally supervised acquisition of the myelogram images.  As the lumbar puncture was just getting underway, the patient experienced acute and persistent right calf muscle cramps - which he reports often occurs while prone, and that he consequently always avoids laying on his stomach. When the symptoms did not relent despite repositioning and massage, he was treated with a single intramuscular injection of 75 mg Demerol, and improved.  TECHNIQUE: Contiguous axial images were obtained through the Lumbar spine after the intrathecal infusion of infusion. Coronal and sagittal reconstructions were obtained of the axial image sets.  COMPARISON:  [facility] lumbar MRI 04/23/2013, and earlier.  FINDINGS: LUMBAR MYELOGRAM FINDINGS:  There is normal lumbar segmentation, corresponding to the numbering system in 2015.  Effacement of contrast from the thecal sac at L3-L4. In the lateral and upright positions there is ventral mass effect on the thecal sac at L3-L4, which progresses to severe when upright. There is mild grade 1 anterolisthesis at that level which does mildly increase with flexion and decrease with extension.  There is also severe disc space loss at L4-L5 with endplate spurring, sclerosis, and evidence of vacuum disc. There is a mild to moderate defect on the ventral thecal sac at that level. No abnormal me motion at that level with flexion extension. No other abnormal motion identified.  CT LUMBAR MYELOGRAM FINDINGS:  Stable vertebral height and alignment since 2015. Visible sacrum and SI joints intact. [place] round lucent areas in the medial right iliac bone are stable since 07/15/2011 (series 4, image 83 and image 87 on the  right).  Negative visualized costophrenic angles. Punctate left nephrolithiasis. Chronic Aortoiliac calcified atherosclerosis noted.  Normal myelographic appearance of the lower thoracic spinal cord, conus at L1.  T11-T12:  Partially visible, grossly negative.  T12-L1:  Negative.  L1-L2:  Negative.  L2-L3: Stable trace retrolisthesis. Stable mild disc bulge. Stable mild facet hypertrophy. No significant stenosis.  L3-L4: Mild disc space loss and circumferential disc bulge. Moderate to severe facet and moderate ligament flavum hypertrophy. Mild spinal stenosis and left lateral recess stenosis. Mild left greater than right L3 foraminal stenosis. This level appears stable since 2015.  L4-L5: Chronic severe disc space loss with vacuum disc and bulky circumferential  disc osteophyte complex. Mild to moderate facet hypertrophy. Borderline to mild lateral recess stenosis primarily on the left. No significant spinal stenosis. Moderate left and mild right L4 foraminal stenosis.  L5-S1: Disc space loss and bulky right eccentric circumferential disc osteophyte complex. Increased density in the proximal right L5 neural foramen, suspicious for disc herniation and possible sequestered disc fragment. Just below the disc space level there is moderate right lateral recess stenosis. No significant spinal stenosis. Mild to moderate left L5 foraminal stenosis is stable.  IMPRESSION: LUMBAR MYELOGRAM IMPRESSION:  1. Abnormal motion at L3-L4 where there is grade 1 anterolisthesis in the neutral position. Upright positioning also a exacerbates spinal stenosis at that level, which appears severe on upright myelographic images. 2. No other abnormal lumbar motion identified.  CT LUMBAR MYELOGRAM IMPRESSION:  1. Severe chronic disc and endplate degeneration at L4-L5 with mild left lateral recess and moderate left L4 foraminal stenosis. 2. Severe right L5-S1 neural foraminal stenosis suspected  due to chronic disc herniation and possible sequestered fragment. Query right L5 radiculitis. Just below the disc space there is also moderate right lateral recess stenosis at the descending right S1 nerve root level. 3. Multifactorial spinal stenosis at L3-L4 appears mild on prone postmyelogram CT imaging. There is also mild left lateral recess and left greater than right L3 foraminal stenosis. 4. Punctate left nephrolithiasis. Chronic aortoiliac calcified atherosclerosis.   Electronically Signed   By: Genevie Ann M.D.   On: 04/19/2014 15:54    Results for orders placed during the hospital encounter of 11/22/17  DG Lumbar Spine 2-3 Views   Narrative CLINICAL DATA:  Low back pain with radiculopathy.  EXAM: LUMBAR SPINE - 2-3 VIEW  COMPARISON:  CT abdomen pelvis 10/19/2017  FINDINGS: Mild anterolisthesis L4-5. Remaining alignment normal. Negative for fracture  Advanced disc degeneration L4-5 with disc space narrowing, endplate sclerosis and spurring similar to the prior study. Pedicle screw and interbody fusion L5-S1. Question pseudarthrosis.  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  Mild anterolisthesis L3-4  Pedicle screw interbody fusion L5-S1.  Question pseudarthrosis.   Electronically Signed   By: Franchot Gallo M.D.   On: 11/22/2017 11:01    Lumbar DG Myelogram Lumbosacral:  Results for orders placed during the hospital encounter of 04/19/14  DG MYELOGRAPHY LUMBAR INJ LUMBOSACRAL   Narrative CLINICAL DATA:  62 year old male with severe back pain. Displacement of lumbar intervertebral disc.  EXAM: LUMBAR MYELOGRAM  FLUOROSCOPY TIME:  1 minutes and 30 seconds  PROCEDURE: After thorough discussion of risks and benefits of the procedure including bleeding, infection, injury to nerves, blood vessels, adjacent structures as well as headache and CSF leak, written and oral informed consent was obtained. Consent was obtained by Dr. Genevie Ann. Time out form was  completed.  Patient was positioned prone on the fluoroscopy table. Local anesthesia was provided with 1% lidocaine without epinephrine after prepped and draped in the usual sterile fashion. Puncture was performed at L2-L3 using a 3 1/2 inch 22-gauge spinal needle via right sub laminar approach. Using a single pass through the dura, the needle was placed within the thecal sac, with return of clear CSF. 15 mL of Omnipaque-180 was injected into the thecal sac, with normal opacification of the nerve roots and cauda equina consistent with free flow within the subarachnoid space.  I personally performed the lumbar puncture and administered the intrathecal contrast. I also personally supervised acquisition of the myelogram images.  As the lumbar puncture was just getting underway, the patient experienced acute and  persistent right calf muscle cramps - which he reports often occurs while prone, and that he consequently always avoids laying on his stomach. When the symptoms did not relent despite repositioning and massage, he was treated with a single intramuscular injection of 75 mg Demerol, and improved.  TECHNIQUE: Contiguous axial images were obtained through the Lumbar spine after the intrathecal infusion of infusion. Coronal and sagittal reconstructions were obtained of the axial image sets.  COMPARISON:  [facility] lumbar MRI 04/23/2013, and earlier.  FINDINGS: LUMBAR MYELOGRAM FINDINGS:  There is normal lumbar segmentation, corresponding to the numbering system in 2015.  Effacement of contrast from the thecal sac at L3-L4. In the lateral and upright positions there is ventral mass effect on the thecal sac at L3-L4, which progresses to severe when upright. There is mild grade 1 anterolisthesis at that level which does mildly increase with flexion and decrease with extension.  There is also severe disc space loss at L4-L5 with endplate spurring,  sclerosis, and evidence of vacuum disc. There is a mild to moderate defect on the ventral thecal sac at that level. No abnormal me motion at that level with flexion extension. No other abnormal motion identified.  CT LUMBAR MYELOGRAM FINDINGS:  Stable vertebral height and alignment since 2015. Visible sacrum and SI joints intact. [place] round lucent areas in the medial right iliac bone are stable since 07/15/2011 (series 4, image 83 and image 87 on the right).  Negative visualized costophrenic angles. Punctate left nephrolithiasis. Chronic Aortoiliac calcified atherosclerosis noted.  Normal myelographic appearance of the lower thoracic spinal cord, conus at L1.  T11-T12:  Partially visible, grossly negative.  T12-L1:  Negative.  L1-L2:  Negative.  L2-L3: Stable trace retrolisthesis. Stable mild disc bulge. Stable mild facet hypertrophy. No significant stenosis.  L3-L4: Mild disc space loss and circumferential disc bulge. Moderate to severe facet and moderate ligament flavum hypertrophy. Mild spinal stenosis and left lateral recess stenosis. Mild left greater than right L3 foraminal stenosis. This level appears stable since 2015.  L4-L5: Chronic severe disc space loss with vacuum disc and bulky circumferential disc osteophyte complex. Mild to moderate facet hypertrophy. Borderline to mild lateral recess stenosis primarily on the left. No significant spinal stenosis. Moderate left and mild right L4 foraminal stenosis.  L5-S1: Disc space loss and bulky right eccentric circumferential disc osteophyte complex. Increased density in the proximal right L5 neural foramen, suspicious for disc herniation and possible sequestered disc fragment. Just below the disc space level there is moderate right lateral recess stenosis. No significant spinal stenosis. Mild to moderate left L5 foraminal stenosis is stable.  IMPRESSION: LUMBAR MYELOGRAM IMPRESSION:  1. Abnormal motion at L3-L4  where there is grade 1 anterolisthesis in the neutral position. Upright positioning also a exacerbates spinal stenosis at that level, which appears severe on upright myelographic images. 2. No other abnormal lumbar motion identified.  CT LUMBAR MYELOGRAM IMPRESSION:  1. Severe chronic disc and endplate degeneration at L4-L5 with mild left lateral recess and moderate left L4 foraminal stenosis. 2. Severe right L5-S1 neural foraminal stenosis suspected due to chronic disc herniation and possible sequestered fragment. Query right L5 radiculitis. Just below the disc space there is also moderate right lateral recess stenosis at the descending right S1 nerve root level. 3. Multifactorial spinal stenosis at L3-L4 appears mild on prone postmyelogram CT imaging. There is also mild left lateral recess and left greater than right L3 foraminal stenosis. 4. Punctate left nephrolithiasis. Chronic aortoiliac calcified atherosclerosis.  Electronically Signed   By: Genevie Ann M.D.   On: 04/19/2014 15:54      Complexity Note: Imaging results reviewed. Results shared with Mr. Urton, using Layman's terms.                         Meds   Current Outpatient Medications:  .  acetaminophen (TYLENOL) 325 MG tablet, Take 650 mg by mouth every 6 (six) hours as needed for mild pain or headache. , Disp: , Rfl:  .  albuterol (PROVENTIL HFA;VENTOLIN HFA) 108 (90 BASE) MCG/ACT inhaler, Inhale 1-2 puffs into the lungs every 6 (six) hours as needed for wheezing or shortness of breath. , Disp: , Rfl:  .  ALPRAZolam (XANAX) 1 MG tablet, Take 1 mg by mouth at bedtime as needed for anxiety., Disp: , Rfl:  .  cetirizine (ZYRTEC) 10 MG tablet, Take 10 mg by mouth daily., Disp: , Rfl:  .  cyclobenzaprine (FLEXERIL) 10 MG tablet, Take by mouth., Disp: , Rfl:  .  fluticasone (FLONASE) 50 MCG/ACT nasal spray, Place into both nostrils daily., Disp: , Rfl:  .  gabapentin (NEURONTIN) 600 MG tablet, 600 mg qAM, 600 mg qPM,  1200 mg qhs, Disp: 90 tablet, Rfl: 2 .  lisinopril (PRINIVIL,ZESTRIL) 20 MG tablet, Take 10 mg by mouth daily., Disp: , Rfl:  .  Multiple Vitamin (MULTIVITAMIN) tablet, Take 1 tablet by mouth daily., Disp: , Rfl:  .  omeprazole (PRILOSEC) 40 MG capsule, Take 40 mg by mouth daily., Disp: , Rfl:  .  tamsulosin (FLOMAX) 0.4 MG CAPS capsule, Take 0.8 mg by mouth daily. , Disp: , Rfl:  .  ketorolac (TORADOL) 10 MG tablet, Take 1 tablet (10 mg total) by mouth every 6 (six) hours as needed. (Patient not taking: Reported on 01/12/2018), Disp: 20 tablet, Rfl: 0  ROS  Constitutional: Denies any fever or chills Gastrointestinal: No reported hemesis, hematochezia, vomiting, or acute GI distress Musculoskeletal: Denies any acute onset joint swelling, redness, loss of ROM, or weakness Neurological: No reported episodes of acute onset apraxia, aphasia, dysarthria, agnosia, amnesia, paralysis, loss of coordination, or loss of consciousness  Allergies  Mr. Saffran has No Known Allergies.  PFSH  Drug: Mr. Nehring  reports no history of drug use. Alcohol:  reports no history of alcohol use. Tobacco:  reports that he has been smoking cigarettes. He has a 60.00 pack-year smoking history. He has never used smokeless tobacco. Medical:  has a past medical history of Arthritis, Asthma, Chronic kidney disease, COPD (chronic obstructive pulmonary disease) ([place]), Dyspnea, GERD (gastroesophageal reflux disease), Hyperlipidemia, Hypertension, Lower urinary tract symptoms (LUTS), Wears dentures, and Wears hearing aid. Surgical: Mr. Voeltz  has a past surgical history that includes Eye surgery; Hernia repair; Transforaminal lumbar interbody fusion (tlif) with pedicle screw fixation 1 level (Right, 06/18/2014); Myringotomy with tube placement (Bilateral, 03/28/2015); Back surgery; Esophagogastroduodenoscopy (egd) with propofol (N/A, 10/24/2015); Cataract extraction w/PHACO (Left, 01/06/2016); Tonsillectomy; and Colonoscopy with propofol  (N/A, 04/19/2016). Family: family history includes Arthritis in his mother; Diabetes in his father; Hypertension in his father and mother.  Constitutional Exam  General appearance: Well nourished, well developed, and well hydrated. In no apparent acute distress Vitals:   01/12/18 1403  BP: (!) 138/93  Pulse: 77  Temp: 98.1 F (36.7 C)  SpO2: 99%  Weight: 175 lb (79.4 kg)  Height: '5\' 7"'  (1.702 m)   BMI Assessment: Estimated body mass index is 27.41 kg/m as calculated from the following:  Height as of this encounter: '5\' 7"'  (1.702 m).   Weight as of this encounter: 175 lb (79.4 kg).  BMI interpretation table: BMI level Category Range association with higher incidence of chronic pain  <18 kg/m2 Underweight   18.5-24.9 kg/m2 Ideal body weight   25-29.9 kg/m2 Overweight Increased incidence by 20%  30-34.9 kg/m2 Obese (Class I) Increased incidence by 68%  35-39.9 kg/m2 Severe obesity (Class II) Increased incidence by 136%  >40 kg/m2 Extreme obesity (Class III) Increased incidence by 254%   Patient's current BMI Ideal Body weight  Body mass index is 27.41 kg/m. Ideal body weight: 66.1 kg (145 lb 11.6 oz) Adjusted ideal body weight: 71.4 kg (157 lb 6.9 oz)   BMI Readings from Last 4 Encounters:  01/12/18 27.41 kg/m  12/20/17 27.41 kg/m  12/09/17 27.41 kg/m  12/01/17 27.28 kg/m   Wt Readings from Last 4 Encounters:  01/12/18 175 lb (79.4 kg)  12/20/17 175 lb (79.4 kg)  12/09/17 175 lb (79.4 kg)  12/01/17 174 lb 2.6 oz (79 kg)  Psych/Mental status: Alert, oriented x 3 (person, place, & time)       Eyes: PERLA Respiratory: No evidence of acute respiratory distress  Cervical Spine Area Exam  Skin & Axial Inspection: No masses, redness, edema, swelling, or associated skin lesions Alignment: Symmetrical Functional ROM: Unrestricted ROM      Stability: No instability detected Muscle Tone/Strength: Functionally intact. No obvious neuro-muscular anomalies detected. Sensory  (Neurological): Unimpaired Palpation: No palpable anomalies              Upper Extremity (UE) Exam    Side: Right upper extremity  Side: Left upper extremity  Skin & Extremity Inspection: Skin color, temperature, and hair growth are WNL. No peripheral edema or cyanosis. No masses, redness, swelling, asymmetry, or associated skin lesions. No contractures.  Skin & Extremity Inspection: Skin color, temperature, and hair growth are WNL. No peripheral edema or cyanosis. No masses, redness, swelling, asymmetry, or associated skin lesions. No contractures.  Functional ROM: Unrestricted ROM          Functional ROM: Unrestricted ROM          Muscle Tone/Strength: Functionally intact. No obvious neuro-muscular anomalies detected.  Muscle Tone/Strength: Functionally intact. No obvious neuro-muscular anomalies detected.  Sensory (Neurological): Unimpaired          Sensory (Neurological): Unimpaired          Palpation: No palpable anomalies              Palpation: No palpable anomalies              Provocative Test(s):  Phalen's test: deferred Tinel's test: deferred Apley's scratch test (touch opposite shoulder):  Action 1 (Across chest): deferred Action 2 (Overhead): deferred Action 3 (LB reach): deferred   Provocative Test(s):  Phalen's test: deferred Tinel's test: deferred Apley's scratch test (touch opposite shoulder):  Action 1 (Across chest): deferred Action 2 (Overhead): deferred Action 3 (LB reach): deferred    Thoracic Spine Area Exam  Skin & Axial Inspection: No masses, redness, or swelling Alignment: Symmetrical Functional ROM: Decreased ROM Stability: No instability detected Muscle Tone/Strength: Functionally intact. No obvious neuro-muscular anomalies detected. Sensory (Neurological): Movement associated pain Muscle strength & Tone: No palpable anomalies  Lumbar Spine Area Exam  Skin & Axial Inspection: Well healed scar from previous spine surgery detected Alignment:  Asymmetric Functional ROM: Decreased ROM       Stability: No instability detected Muscle Tone/Strength: Increased muscle tone over affected  area Sensory (Neurological): Dermatomal pain pattern Palpation: No palpable anomalies       Provocative Tests: Hyperextension/rotation test: (+) due to pain. Lumbar quadrant test (Kemp's test): (+) due to fusion restriction. Lateral bending test: (+) ipsilateral radicular pain, on the right. Positive for right-sided foraminal stenosis. Patrick's Maneuver: deferred today                   FABER* test: deferred today                   S-I anterior distraction/compression test: deferred today         S-I lateral compression test: deferred today         S-I Thigh-thrust test: deferred today         S-I Gaenslen's test: deferred today         *(Flexion, ABduction and External Rotation)  Gait & Posture Assessment  Ambulation: Limited Gait: Antalgic Posture: Difficulty standing up straight, due to pain   Lower Extremity Exam    Side: Right lower extremity  Side: Left lower extremity  Stability: No instability observed          Stability: No instability observed          Skin & Extremity Inspection: Skin color, temperature, and hair growth are WNL. No peripheral edema or cyanosis. No masses, redness, swelling, asymmetry, or associated skin lesions. No contractures.  Skin & Extremity Inspection: Skin color, temperature, and hair growth are WNL. No peripheral edema or cyanosis. No masses, redness, swelling, asymmetry, or associated skin lesions. No contractures.  Functional ROM: Decreased ROM for all joints of the lower extremity          Functional ROM: Unrestricted ROM                  Muscle Tone/Strength: Functionally intact. No obvious neuro-muscular anomalies detected.  Muscle Tone/Strength: Functionally intact. No obvious neuro-muscular anomalies detected.  Sensory (Neurological): Dermatomal pain pattern L2 and L3  Sensory (Neurological): Unimpaired         DTR: Patellar: 1+: trace Achilles: deferred today Plantar: deferred today  DTR: Patellar: 1+: trace Achilles: deferred today Plantar: deferred today  Palpation: No palpable anomalies  Palpation: No palpable anomalies   Assessment  Primary Diagnosis & Pertinent Problem List: The primary encounter diagnosis was Lumbar radiculopathy (R L2/3). Diagnoses of Status post lumbar surgery, History of lumbar fusion, and Failed back surgical syndrome were also pertinent to this visit.  Status Diagnosis  Worsening Persistent Persistent 1. Lumbar radiculopathy (R L2/3)   2. Status post lumbar surgery   3. History of lumbar fusion   4. Failed back surgical syndrome     General Recommendations: The pain condition that the patient suffers from is best treated with a multidisciplinary approach that involves an increase in physical activity to prevent de-conditioning and worsening of the pain cycle, as well as psychological counseling (formal and/or informal) to address the co-morbid psychological affects of pain. Treatment will often involve judicious use of pain medications and interventional procedures to decrease the pain, allowing the patient to participate in the physical activity that will ultimately produce long-lasting pain reductions. The goal of the multidisciplinary approach is to return the patient to a higher level of overall function and to restore their ability to perform activities of daily living.  62 year old male with history of axial low back pain that radiates down his right lower extremity in a dermatomal fashion.  Patient follows up status post lumbar MRI  which shows new circumferential disc protrusion at L2-L3, right greater than left.  There is some potential right-sided nerve compression at L2-L3 at this level.  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  Patient does have a history of lumbar decompression and fusion at L5-S1.   Patient does have neuroforaminal narrowing of the right affecting the right L5 nerve root.  In regards to therapeutic options, I recommend right-sided lumbar epidural steroid injection at L2-L3 to help target his radicular symptoms at this level.  Patient does have diffuse lumbar degenerative disc disease and neuroforaminal narrowing at multiple lumbar levels that could be contributing to his symptoms.  Therefore we will employ a interlaminar approach rather than transforaminal.  We also briefly discussed spinal cord stimulation for his lumbar radicular pain.  We will continue this discussion in the future if his pain is not receptive to lumbar ESI.  Plan: -Right L2-L3 ESI for new disc herniation at that level -Consideration of spinal cord stimulator trial, failed back surgical syndrome, history of L5-S1 fusion  Lab-work, procedure(s), and/or referral(s): Orders Placed This Encounter  Procedures  . Lumbar Epidural Injection   Provider-requested follow-up: Return for Procedure.  Time Note: Greater than 50% of the 25 minute(s) of face-to-face time spent with Mr. Texidor, was spent in counseling/coordination of care regarding: Mr. Swigert primary cause of pain, the significance of each one oth the test(s) anomalies and it's corresponding characteristic pain pattern(s), the treatment plan, treatment alternatives, the risks and possible complications of proposed treatment, going over the informed consent and realistic expectations.  Future Appointments  Date Time Provider Alicia  01/30/2018 11:00 AM [name], MD [facility] None    Primary Care Physician: [facility] [place] Location: [facility] Outpatient Pain Management Facility Note by: [name], M.D Date: 01/12/2018; Time: 2:59 PM  There are no Patient Instructions on file for this visit.

## 2018-01-30 ENCOUNTER — Ambulatory Visit: Payer: Medicaid Other | Admitting: Student in an Organized Health Care Education/Training Program

## 2018-01-31 DIAGNOSIS — F411 Generalized anxiety disorder: Secondary | ICD-10-CM | POA: Diagnosis not present

## 2018-01-31 DIAGNOSIS — J189 Pneumonia, unspecified organism: Secondary | ICD-10-CM | POA: Diagnosis not present

## 2018-01-31 DIAGNOSIS — F331 Major depressive disorder, recurrent, moderate: Secondary | ICD-10-CM | POA: Diagnosis not present

## 2018-02-01 ENCOUNTER — Ambulatory Visit: Payer: Medicaid Other | Admitting: Student in an Organized Health Care Education/Training Program

## 2018-02-08 ENCOUNTER — Encounter: Payer: Self-pay | Admitting: Student in an Organized Health Care Education/Training Program

## 2018-02-08 ENCOUNTER — Ambulatory Visit (HOSPITAL_BASED_OUTPATIENT_CLINIC_OR_DEPARTMENT_OTHER): Payer: Medicaid Other | Admitting: Student in an Organized Health Care Education/Training Program

## 2018-02-08 ENCOUNTER — Ambulatory Visit
Admission: RE | Admit: 2018-02-08 | Discharge: 2018-02-08 | Disposition: A | Discharge status: Home / Self Care | Payer: Medicaid Other | Source: Ambulatory Visit | Attending: Student in an Organized Health Care Education/Training Program | Admitting: Student in an Organized Health Care Education/Training Program

## 2018-02-08 VITALS — BP 158/93 | HR 86 | Temp 97.7°F | Resp 16 | Ht 67.0 in | Wt 185.0 lb

## 2018-02-08 DIAGNOSIS — M5416 Radiculopathy, lumbar region: Secondary | ICD-10-CM

## 2018-02-08 MED ORDER — IOPAMIDOL (ISOVUE-M 200) INJECTION 41%
10.0000 mL | Freq: Once | INTRAMUSCULAR | Status: AC
  Administered 2018-02-08: 10 mL via EPIDURAL
  Filled 2018-02-08: qty 10

## 2018-02-08 MED ORDER — ROPIVACAINE HCL 2 MG/ML IJ SOLN
2.0000 mL | Freq: Once | INTRAMUSCULAR | Status: AC
  Administered 2018-02-08: 10 mL via EPIDURAL
  Filled 2018-02-08: qty 10

## 2018-02-08 MED ORDER — DEXAMETHASONE SODIUM PHOSPHATE 10 MG/ML IJ SOLN
10.0000 mg | Freq: Once | INTRAMUSCULAR | Status: AC
  Administered 2018-02-08: 10 mg
  Filled 2018-02-08: qty 1

## 2018-02-08 MED ORDER — FENTANYL CITRATE (PF) 100 MCG/2ML IJ SOLN
25.0000 ug | INTRAMUSCULAR | Status: DC | PRN
  Administered 2018-02-08: 75 ug via INTRAVENOUS

## 2018-02-08 MED ORDER — LACTATED RINGERS IV SOLN
1000.0000 mL | Freq: Once | INTRAVENOUS | Status: AC
  Administered 2018-02-08: 1000 mL via INTRAVENOUS

## 2018-02-08 MED ORDER — LIDOCAINE HCL 2 % IJ SOLN
10.0000 mL | Freq: Once | INTRAMUSCULAR | Status: AC
  Administered 2018-02-08: 400 mg
  Filled 2018-02-08: qty 20

## 2018-02-08 MED ORDER — SODIUM CHLORIDE 0.9% FLUSH
2.0000 mL | Freq: Once | INTRAVENOUS | Status: AC
  Administered 2018-02-08: 10 mL

## 2018-02-08 MED ORDER — FENTANYL CITRATE (PF) 100 MCG/2ML IJ SOLN
INTRAMUSCULAR | Status: AC
  Filled 2018-02-08: qty 2

## 2018-02-08 NOTE — Patient Instructions (Signed)
____________________________________________________________________________________________  Post-Procedure Discharge Instructions  Instructions:  Apply ice: Fill a plastic sandwich bag with crushed ice. Cover it with a Hottenstein towel and apply to injection site. Apply for 15 minutes then remove x 15 minutes. Repeat sequence on day of procedure, until you go to bed. The purpose is to minimize swelling and discomfort after procedure.  Apply heat: Apply heat to procedure site starting the day following the procedure. The purpose is to treat any soreness and discomfort from the procedure.  Food intake: Start with clear liquids (like water) and advance to regular food, as tolerated.   Physical activities: Keep activities to a minimum for the first 8 hours after the procedure.   Driving: If you have received any sedation, you are not allowed to drive for 24 hours after your procedure.  Blood thinner: Restart your blood thinner 6 hours after your procedure. (Only for those taking blood thinners)  Insulin: As soon as you can eat, you may resume your normal dosing schedule. (Only for those taking insulin)  Infection prevention: Keep procedure site clean and dry.  Post-procedure Pain Diary: Extremely important that this be done correctly and accurately. Recorded information will be used to determine the next step in treatment.  Pain evaluated is that of treated area only. Do not include pain from an untreated area.  Complete every hour, on the hour, for the initial 8 hours. Set an alarm to help you do this part accurately.  Do not go to sleep and have it completed later. It will not be accurate.  Follow-up appointment: Keep your follow-up appointment after the procedure. Usually 2 weeks for most procedures. (6 weeks in the case of radiofrequency.) Bring you pain diary.   Expect:  From numbing medicine (AKA: Local Anesthetics): Numbness or decrease in pain.  Onset: Full effect within 15  minutes of injected.  Duration: It will depend on the type of local anesthetic used. On the average, 1 to 8 hours.   From steroids: Decrease in swelling or inflammation. Once inflammation is improved, relief of the pain will follow.  Onset of benefits: Depends on the amount of swelling present. The more swelling, the longer it will take for the benefits to be seen. In some cases, up to 10 days.  Duration: Steroids will stay in the system x 2 weeks. Duration of benefits will depend on multiple posibilities including persistent irritating factors.  Occasional side-effects: Facial flushing (red, warm cheeks) , cramps (if present, drink Gatorade and take over-the-counter Magnesium 450-500 mg once to twice a day).  From procedure: Some discomfort is to be expected once the numbing medicine wears off. This should be minimal if ice and heat are applied as instructed.  Call if:  You experience numbness and weakness that gets worse with time, as opposed to wearing off.  New onset bowel or bladder incontinence. (This applies to Spinal procedures only)  Emergency Numbers:  Durning business hours (Monday - Thursday, 8:00 AM - 4:00 PM) (Friday, 9:00 AM - 12:00 Noon): [phone]  After hours: [phone] ____________________________________________________________________________________________    

## 2018-02-08 NOTE — Progress Notes (Signed)
Patient's Name: Ryan [name]  MRN: [id number]  Referring Provider: [facility]  DOB: [birth year]  PCP: [facility]  DOS: 02/08/2018  Note by: Edward JollyBilal Donzell Coller, MD  Service setting: Ambulatory outpatient  Specialty: Interventional Pain Management  Patient type: Established  Location: [facility] (AMB) Pain Management Facility  Visit type: Interventional Procedure   Primary Reason for Visit: Interventional Pain Management Treatment. CC: Back Pain (lower bilateral ) and Neck Pain (right side )  Procedure:          Anesthesia, Analgesia, Anxiolysis:  Type: Therapeutic Inter-Laminar Epidural Steroid Injection  #1  Region: Lumbar Level: L1-2 Level. Laterality: Right         Type: Moderate (Conscious) Sedation combined with Local Anesthesia Indication(s): Analgesia and Anxiety Route: Intravenous (IV) IV Access: Secured Sedation: Meaningful verbal contact was maintained at all times during the procedure  Local Anesthetic: Lidocaine 1-2%  Position: Prone with head of the table was raised to facilitate breathing.   Indications: 1. Lumbar radiculopathy (R L2/3)    Pain Score: Pre-procedure: 6 /10 Post-procedure: 1 /10  Pre-op Assessment:  Ryan [name] is a 63 y.o. (year old), male patient, seen today for interventional treatment. He  has a past surgical history that includes Eye surgery; Hernia repair; Transforaminal lumbar interbody fusion (tlif) with pedicle screw fixation 1 level (Right, 06/18/2014); Myringotomy with tube placement (Bilateral, 03/28/2015); Back surgery; Esophagogastroduodenoscopy (egd) with propofol (N/A, 10/24/2015); Cataract extraction w/PHACO (Left, 01/06/2016); Tonsillectomy; and Colonoscopy with propofol (N/A, 04/19/2016). Ryan [name] has a current medication list which includes the following prescription(s): acetaminophen, albuterol, alprazolam, cetirizine, cyclobenzaprine, fluticasone, gabapentin, lisinopril, multivitamin, omeprazole, sildenafil, and  tamsulosin, and the following Facility-Administered Medications: fentanyl. His primarily concern today is the Back Pain (lower bilateral ) and Neck Pain (right side )  Initial Vital Signs:  Pulse/HCG Rate: 86ECG Heart Rate: 72 Temp: 97.7 F (36.5 C) Resp: 16 BP: (!) 150/114 SpO2: 97 %  BMI: Estimated body mass index is 28.98 kg/m as calculated from the following:   Height as of this encounter: 5\' 7"  (1.702 m).   Weight as of this encounter: 185 lb (83.9 kg).  Risk Assessment: Allergies: Reviewed. He has No Known Allergies.  Allergy Precautions: None required Coagulopathies: Reviewed. None identified.  Blood-thinner therapy: None at this time Active Infection(s): Reviewed. None identified. Ryan [name] is afebrile  Site Confirmation: Ryan [name] was asked to confirm the procedure and laterality before marking the site Procedure checklist: Completed Consent: Before the procedure and under the influence of no sedative(s), amnesic(s), or anxiolytics, the patient was informed of the treatment options, risks and possible complications. To fulfill our ethical and legal obligations, as recommended by the American Medical Association's Code of Ethics, I have informed the patient of my clinical impression; the nature and purpose of the treatment or procedure; the risks, benefits, and possible complications of the intervention; the alternatives, including doing nothing; the risk(s) and benefit(s) of the alternative treatment(s) or procedure(s); and the risk(s) and benefit(s) of doing nothing. The patient was provided information about the general risks and possible complications associated with the procedure. These may include, but are not limited to: failure to achieve desired goals, infection, bleeding, organ or nerve damage, allergic reactions, paralysis, and death. In addition, the patient was informed of those risks and complications associated to Spine-related procedures, such as failure to decrease  pain; infection (i.e.: Meningitis, epidural or intraspinal abscess); bleeding (i.e.: epidural hematoma, subarachnoid hemorrhage, or any other type of intraspinal or peri-dural bleeding);  organ or nerve damage (i.e.: Any type of peripheral nerve, nerve root, or spinal cord injury) with subsequent damage to sensory, motor, and/or autonomic systems, resulting in permanent pain, numbness, and/or weakness of one or several areas of the body; allergic reactions; (i.e.: anaphylactic reaction); and/or death. Furthermore, the patient was informed of those risks and complications associated with the medications. These include, but are not limited to: allergic reactions (i.e.: anaphylactic or anaphylactoid reaction(s)); adrenal axis suppression; blood sugar elevation that in diabetics may result in ketoacidosis or comma; water retention that in patients with history of congestive heart failure may result in shortness of breath, pulmonary edema, and decompensation with resultant heart failure; weight gain; swelling or edema; medication-induced neural toxicity; particulate matter embolism and blood vessel occlusion with resultant organ, and/or nervous system infarction; and/or aseptic necrosis of one or more joints. Finally, the patient was informed that Medicine is not an exact science; therefore, there is also the possibility of unforeseen or unpredictable risks and/or possible complications that may result in a catastrophic outcome. The patient indicated having understood very clearly. We have given the patient no guarantees and we have made no promises. Enough time was given to the patient to ask questions, all of which were answered to the patient's satisfaction. Ryan [name] has indicated that he wanted to continue with the procedure. Attestation: I, the ordering provider, attest that I have discussed with the patient the benefits, risks, side-effects, alternatives, likelihood of achieving goals, and potential problems  during recovery for the procedure that I have provided informed consent. Date  Time: 02/08/2018 10:35 AM  Pre-Procedure Preparation:  Monitoring: As per clinic protocol. Respiration, ETCO2, SpO2, BP, heart rate and rhythm monitor placed and checked for adequate function Safety Precautions: Patient was assessed for positional comfort and pressure points before starting the procedure. Time-out: I initiated and conducted the "Time-out" before starting the procedure, as per protocol. The patient was asked to participate by confirming the accuracy of the "Time Out" information. Verification of the correct person, site, and procedure were performed and confirmed by me, the nursing staff, and the patient. "Time-out" conducted as per Joint Commission's Universal Protocol (UP.01.01.01). Time: 1110  Description of Procedure:          Target Area: The interlaminar space, initially targeting the lower laminar border of the superior vertebral body. Approach: Paramedial approach. Area Prepped: Entire Posterior Lumbar Region Prepping solution: ChloraPrep (2% chlorhexidine gluconate and 70% isopropyl alcohol) Safety Precautions: Aspiration looking for blood return was conducted prior to all injections. At no point did we inject any substances, as a needle was being advanced. No attempts were made at seeking any paresthesias. Safe injection practices and needle disposal techniques used. Medications properly checked for expiration dates. SDV (single dose vial) medications used. Description of the Procedure: Protocol guidelines were followed. The procedure needle was introduced through the skin, ipsilateral to the reported pain, and advanced to the target area. Bone was contacted and the needle walked caudad, until the lamina was cleared. The epidural space was identified using "loss-of-resistance technique" with 2-3 ml of PF-NaCl (0.9% NSS), in a 5cc LOR glass syringe.  Vitals:   02/08/18 1119 02/08/18 1128 02/08/18  1139 02/08/18 1148  BP: (!) 123/94 (!) 149/81 (!) 168/99 (!) 158/93  Pulse:      Resp: 13 12 15 16   Temp:      TempSrc:      SpO2: 96% 98% 98% 97%  Weight:      Height:  Start Time: 1110 hrs. End Time: 1117 hrs.  Materials:  Needle(s) Type: Epidural needle Gauge: 17G Length: 3.5-in Medication(s): Please see orders for medications and dosing details. 8 cc solution consisting of 5 cc of preservative-free saline, 2 cc of 0.2% ropivacaine, 1 cc of Decadron 10 mg/cc. Imaging Guidance (Spinal):          Type of Imaging Technique: Fluoroscopy Guidance (Spinal) Indication(s): Assistance in needle guidance and placement for procedures requiring needle placement in or near specific anatomical locations not easily accessible without such assistance. Exposure Time: Please see nurses notes. Contrast: Before injecting any contrast, we confirmed that the patient did not have an allergy to iodine, shellfish, or radiological contrast. Once satisfactory needle placement was completed at the desired level, radiological contrast was injected. Contrast injected under live fluoroscopy. No contrast complications. See chart for type and volume of contrast used. Fluoroscopic Guidance: I was personally present during the use of fluoroscopy. "Tunnel Vision Technique" used to obtain the best possible view of the target area. Parallax error corrected before commencing the procedure. "Direction-depth-direction" technique used to introduce the needle under continuous pulsed fluoroscopy. Once target was reached, antero-posterior, oblique, and lateral fluoroscopic projection used confirm needle placement in all planes. Images permanently stored in EMR. Interpretation: I personally interpreted the imaging intraoperatively. Adequate needle placement confirmed in multiple planes. Appropriate spread of contrast into desired area was observed. No evidence of afferent or efferent intravascular uptake. No intrathecal or  subarachnoid spread observed. Permanent images saved into the patient's record.  Antibiotic Prophylaxis:   Anti-infectives (From admission, onward)   None     Indication(s): None identified  Post-operative Assessment:  Post-procedure Vital Signs:  Pulse/HCG Rate: 8665 Temp: 97.7 F (36.5 C) Resp: 16 BP: (!) 158/93 SpO2: 97 %  EBL: None  Complications: No immediate post-treatment complications observed by team, or reported by patient.  Note: The patient tolerated the entire procedure well. A repeat set of vitals were taken after the procedure and the patient was kept under observation following institutional policy, for this type of procedure. Post-procedural neurological assessment was performed, showing return to baseline, prior to discharge. The patient was provided with post-procedure discharge instructions, including a section on how to identify potential problems. Should any problems arise concerning this procedure, the patient was given instructions to immediately contact us, at any time, without hesitation. In any case, we plan to contact the patient by telephone for a follow-up status report regarding this interventional procedure.  Comments:  No additional relevant information.  Plan of Care   Imaging Orders     DG C-Arm 1-60 Min-No Report Procedure Orders    No procedure(s) ordered today    Medications ordered for procedure: Meds ordered this encounter  Medications  . iopamidol (ISOVUE-M) 41 % intrathecal injection 10 mL  . ropivacaine (PF) 2 mg/mL (0.2%) (NAROPIN) injection 2 mL  . sodium chloride flush (NS) 0.9 % injection 2 mL  . lidocaine (XYLOCAINE) 2 % (with pres) injection 200 mg  . dexamethasone (DECADRON) injection 10 mg  . lactated ringers infusion 1,000 mL  . fentaNYL (SUBLIMAZE) injection 25-100 mcg    Make sure Narcan is available in the pyxis when using this medication. In the event of respiratory depression (RR< 8/min): Titrate NARCAN (naloxone)  in increments of 0.1 to 0.2 mg IV at 2-3 minute intervals, until desired degree of reversal.   Medications administered: We administered iopamidol, ropivacaine (PF) 2 mg/mL (0.2%), sodium chloride flush, lidocaine, dexamethasone, lactated ringers, and fentaNYL.  See  the medical record for exact dosing, route, and time of administration.  Disposition: Discharge home  Discharge Date & Time: 02/08/2018; 1150 hrs.   Physician-requested Follow-up: Return in about 4 weeks (around 03/08/2018) for Post Procedure Evaluation.  Future Appointments  Date Time Provider Department Center  03/07/2018 10:15 AM Edward Jolly, MD [facility] None   Primary Care Physician: [facility] Location: [facility] Outpatient Pain Management Facility Note by: Edward Jolly, MD Date: 02/08/2018; Time: 1:04 PM  Disclaimer:  Medicine is not an exact science. The only guarantee in medicine is that nothing is guaranteed. It is important to note that the decision to proceed with this intervention was based on the information collected from the patient. The Data and conclusions were drawn from the patient's questionnaire, the interview, and the physical examination. Because the information was provided in large part by the patient, it cannot be guaranteed that it has not been purposely or unconsciously manipulated. Every effort has been made to obtain as much relevant data as possible for this evaluation. It is important to note that the conclusions that lead to this procedure are derived in large part from the available data. Always take into account that the treatment will also be dependent on availability of resources and existing treatment guidelines, considered by other Pain Management Practitioners as being common knowledge and practice, at the time of the intervention. For Medico-Legal purposes, it is also important to point out that variation in procedural techniques and pharmacological choices are the  acceptable norm. The indications, contraindications, technique, and results of the above procedure should only be interpreted and judged by a Board-Certified Interventional Pain Specialist with extensive familiarity and expertise in the same exact procedure and technique.

## 2018-02-08 NOTE — Progress Notes (Signed)
Safety precautions to be maintained throughout the outpatient stay will include: orient to surroundings, keep bed in low position, maintain call bell within reach at all times, provide assistance with transfer out of bed and ambulation.  

## 2018-02-09 ENCOUNTER — Telehealth: Payer: Self-pay | Admitting: *Deleted

## 2018-02-09 NOTE — Telephone Encounter (Signed)
Patient states that he is very sore around the insertion site and had a very difficult time sleeping last night because he was hurting.  Asked if he used his ice last evening, denies, told him that he could apply ice today or he could use heat today which every he would prefer.  I told him that this would improve and that also, his pain may improve as the steroid begins to do its job.  Patient verbalizes u/o information.

## 2018-02-27 DIAGNOSIS — F411 Generalized anxiety disorder: Secondary | ICD-10-CM | POA: Diagnosis not present

## 2018-03-07 ENCOUNTER — Other Ambulatory Visit: Payer: Self-pay

## 2018-03-07 ENCOUNTER — Encounter: Payer: Self-pay | Admitting: Student in an Organized Health Care Education/Training Program

## 2018-03-07 ENCOUNTER — Ambulatory Visit
Payer: Medicaid Other | Attending: Student in an Organized Health Care Education/Training Program | Admitting: Student in an Organized Health Care Education/Training Program

## 2018-03-07 VITALS — BP 113/60 | HR 77 | Temp 97.7°F | Resp 16 | Ht 67.0 in | Wt 185.0 lb

## 2018-03-07 DIAGNOSIS — M47816 Spondylosis without myelopathy or radiculopathy, lumbar region: Secondary | ICD-10-CM | POA: Insufficient documentation

## 2018-03-07 DIAGNOSIS — M48062 Spinal stenosis, lumbar region with neurogenic claudication: Secondary | ICD-10-CM | POA: Diagnosis not present

## 2018-03-07 DIAGNOSIS — G894 Chronic pain syndrome: Secondary | ICD-10-CM | POA: Insufficient documentation

## 2018-03-07 DIAGNOSIS — M5416 Radiculopathy, lumbar region: Secondary | ICD-10-CM | POA: Diagnosis not present

## 2018-03-07 DIAGNOSIS — M5137 Other intervertebral disc degeneration, lumbosacral region: Secondary | ICD-10-CM | POA: Diagnosis not present

## 2018-03-07 DIAGNOSIS — M961 Postlaminectomy syndrome, not elsewhere classified: Secondary | ICD-10-CM

## 2018-03-07 DIAGNOSIS — Z981 Arthrodesis status: Secondary | ICD-10-CM | POA: Insufficient documentation

## 2018-03-07 NOTE — Patient Instructions (Signed)
____________________________________________________________________________________________  Preparing for Procedure with Sedation  Instructions: . Oral Intake: Do not eat or drink anything for at least 8 hours prior to your procedure. . Transportation: Public transportation is not allowed. Bring an adult driver. The driver must be physically present in our waiting room before any procedure can be started. . Physical Assistance: Bring an adult physically capable of assisting you, in the event you need help. This adult should keep you company at home for at least 6 hours after the procedure. . Blood Pressure Medicine: Take your blood pressure medicine with a sip of water the morning of the procedure. . Blood thinners: Notify our staff if you are taking any blood thinners. Depending on which one you take, there will be specific instructions on how and when to stop it. . Diabetics on insulin: Notify the staff so that you can be scheduled 1st case in the morning. If your diabetes requires high dose insulin, take only  of your normal insulin dose the morning of the procedure and notify the staff that you have done so. . Preventing infections: Shower with an antibacterial soap the morning of your procedure. . Build-up your immune system: Take 1000 mg of Vitamin C with every meal (3 times a day) the day prior to your procedure. . Antibiotics: Inform the staff if you have a condition or reason that requires you to take antibiotics before dental procedures. . Pregnancy: If you are pregnant, call and cancel the procedure. . Sickness: If you have a cold, fever, or any active infections, call and cancel the procedure. . Arrival: You must be in the facility at least 30 minutes prior to your scheduled procedure. . Children: Do not bring children with you. . Dress appropriately: Bring dark clothing that you would not mind if they get stained. . Valuables: Do not bring any jewelry or valuables.  Procedure  appointments are reserved for interventional treatments only. . No Prescription Refills. . No medication changes will be discussed during procedure appointments. . No disability issues will be discussed.  Reasons to call and reschedule or cancel your procedure: (Following these recommendations will minimize the risk of a serious complication.) . Surgeries: Avoid having procedures within 2 weeks of any surgery. (Avoid for 2 weeks before or after any surgery). . Flu Shots: Avoid having procedures within 2 weeks of a flu shots or . (Avoid for 2 weeks before or after immunizations). . Barium: Avoid having a procedure within 7-10 days after having had a radiological study involving the use of radiological contrast. (Myelograms, Barium swallow or enema study). . Heart attacks: Avoid any elective procedures or surgeries for the initial 6 months after a "Myocardial Infarction" (Heart Attack). . Blood thinners: It is imperative that you stop these medications before procedures. Let us know if you if you take any blood thinner.  . Infection: Avoid procedures during or within two weeks of an infection (including chest colds or gastrointestinal problems). Symptoms associated with infections include: Localized redness, fever, chills, night sweats or profuse sweating, burning sensation when voiding, cough, congestion, stuffiness, runny nose, sore throat, diarrhea, nausea, vomiting, cold or Flu symptoms, recent or current infections. It is specially important if the infection is over the area that we intend to treat. . Heart and lung problems: Symptoms that may suggest an active cardiopulmonary problem include: cough, chest pain, breathing difficulties or shortness of breath, dizziness, ankle swelling, uncontrolled high or unusually low blood pressure, and/or palpitations. If you are experiencing any of these symptoms, cancel   your procedure and contact your primary care physician for an evaluation.  Remember:   Regular Business hours are:  Monday to Thursday 8:00 AM to 4:00 PM  Provider's Schedule: Francisco Naveira, MD:  Procedure days: Tuesday and Thursday 7:30 AM to 4:00 PM  Bilal Lateef, MD:  Procedure days: Monday and Wednesday 7:30 AM to 4:00 PM ____________________________________________________________________________________________    

## 2018-03-07 NOTE — Progress Notes (Signed)
Safety precautions to be maintained throughout the outpatient stay will include: orient to surroundings, keep bed in low position, maintain call bell within reach at all times, provide assistance with transfer out of bed and ambulation.  

## 2018-03-07 NOTE — Progress Notes (Signed)
Patient's Name: Ryan [name]  MRN: [id number]  Referring Provider: [facility], [facility]  DOB: 1955/05/09  PCP: [facility], [place]  DOS: 03/07/2018  Note by: [name], MD  Service setting: Ambulatory outpatient  Specialty: Interventional Pain Management  Location: [facility] (AMB) Pain Management Facility    Patient type: Established   Primary Reason(s) for Visit: Encounter for post-procedure evaluation of chronic illness with mild to moderate exacerbation CC: Back Pain (lower)  HPI  Ryan [name] is a 63 y.o. year old, male patient, who comes today for a post-procedure evaluation. He has Airway hyperreactivity; Chronic LBP; ED (erectile dysfunction) of organic origin; BP (high blood pressure); Lower urinary tract symptoms; Temporary cerebral vascular dysfunction; Compulsive tobacco user syndrome; DDD (degenerative disc disease), lumbosacral; Spinal stenosis, lumbar region, with neurogenic claudication; Lumbar radiculopathy; Lumbar herniated disc; Lumbar spondylosis; Failed back surgical syndrome; and History of lumbar fusion on their problem list. His primarily concern today is the Back Pain (lower)  Pain Assessment: Location: Lower Back Radiating: denies Onset: More than a month ago Duration: Chronic pain Quality: Aching, Nagging, Burning Severity: 5 /10 (subjective, self-reported pain score)  Note: Reported level is compatible with observation.                         When using our objective Pain Scale, levels between 6 and 10/10 are said to belong in an emergency room, as it progressively worsens from a 6/10, described as severely limiting, requiring emergency care not usually available at an outpatient pain management facility. At a 6/10 level, communication becomes difficult and requires great effort. Assistance to reach the emergency department may be required. Facial flushing and profuse sweating along with potentially dangerous increases in heart rate and blood pressure  will be evident. Effect on ADL:   Timing: Constant Modifying factors: injections BP: 113/60  HR: 77  Ryan [name] comes in today for post-procedure evaluation.  Further details on both, my assessment(s), as well as the proposed treatment plan, please see below.  Post-Procedure Assessment  02/08/2018 Procedure: Right L1-L2 ESI Pre-procedure pain score:  6/10 Post-procedure pain score: 1/10         Influential Factors: BMI: 28.98 kg/m Intra-procedural challenges: None observed.         Assessment challenges: None detected.              Reported side-effects: None.        Post-procedural adverse reactions or complications: None reported         Sedation: Please see nurses note. When no sedatives are used, the analgesic levels obtained are directly associated to the effectiveness of the local anesthetics. However, when sedation is provided, the level of analgesia obtained during the initial 1 hour following the intervention, is believed to be the result of a combination of factors. These factors may include, but are not limited to: 1. The effectiveness of the local anesthetics used. 2. The effects of the analgesic(s) and/or anxiolytic(s) used. 3. The degree of discomfort experienced by the patient at the time of the procedure. 4. The patients ability and reliability in recalling and recording the events. 5. The presence and influence of possible secondary gains and/or psychosocial factors. Reported result: Relief experienced during the 1st hour after the procedure: 100 % (Ultra-Short Term Relief)            Interpretative annotation: Clinically appropriate result. Analgesia during this period is likely to be Local Anesthetic and/or IV Sedative (Analgesic/Anxiolytic)  related.          Effects of local anesthetic: The analgesic effects attained during this period are directly associated to the localized infiltration of local anesthetics and therefore cary significant diagnostic value as to the  etiological location, or anatomical origin, of the pain. Expected duration of relief is directly dependent on the pharmacodynamics of the local anesthetic used. Long-acting (4-6 hours) anesthetics used.  Reported result: Relief during the next 4 to 6 hour after the procedure: 100 % (Short-Term Relief)            Interpretative annotation: Clinically appropriate result. Analgesia during this period is likely to be Local Anesthetic-related.          Long-term benefit: Defined as the period of time past the expected duration of local anesthetics (1 hour for short-acting and 4-6 hours for long-acting). With the possible exception of prolonged sympathetic blockade from the local anesthetics, benefits during this period are typically attributed to, or associated with, other factors such as analgesic sensory neuropraxia, antiinflammatory effects, or beneficial biochemical changes provided by agents other than the local anesthetics.  Reported result: Extended relief following procedure: 0 % (Long-Term Relief)            Interpretative annotation: Clinically possible results. No long-term benefit. Therapeutic failure. Pain appears to be refractory to this treatment modality.          Current benefits: Defined as reported results that persistent at this point in time.   Analgesia: 0 %            Function: Back to baseline ROM: Back to baseline Interpretative annotation: Recurrence of symptoms. Therapeutic failure. Results would suggest persistent aggravating factors.          Interpretation: Results would suggest failure of therapy in achieving desired goal(s).                  Plan:  Please see "Plan of Care" for details.                Laboratory Chemistry  Inflammation Markers (CRP: Acute Phase) (ESR: Chronic Phase) No results found for: CRP, ESRSEDRATE, LATICACIDVEN                       Rheumatology Markers No results found for: RF, ANA, LABURIC, URICUR, LYMEIGGIGMAB, LYMEABIGMQN, HLAB27                       Renal Function Markers Lab Results  Component Value Date   BUN 22 10/19/2017   CREATININE 0.95 10/19/2017   GFRAA >60 10/19/2017   GFRNONAA >60 10/19/2017                             Hepatic Function Markers Lab Results  Component Value Date   AST 18 10/19/2017   ALT 17 10/19/2017   ALBUMIN 4.2 10/19/2017   ALKPHOS 104 10/19/2017   LIPASE 77 (H) 10/19/2017                        Electrolytes Lab Results  Component Value Date   NA 140 10/19/2017   K 4.9 10/19/2017   CL 107 10/19/2017   CALCIUM 9.5 10/19/2017                        Neuropathy Markers No results found for: VITAMINB12, FOLATE, HGBA1C,  HIV                      CNS Tests No results found for: COLORCSF, APPEARCSF, RBCCOUNTCSF, WBCCSF, POLYSCSF, LYMPHSCSF, EOSCSF, PROTEINCSF, GLUCCSF, JCVIRUS, CSFOLI, IGGCSF                      Bone Pathology Markers No results found for: VD25OH, HE527PO2UMP, [id number], [id number], 25OHVITD1, 25OHVITD2, 25OHVITD3, TESTOFREE, TESTOSTERONE                       Coagulation Parameters Lab Results  Component Value Date   PLT 231 10/19/2017                        Cardiovascular Markers Lab Results  Component Value Date   TROPONINI <0.03 09/08/2017   HGB 16.0 10/19/2017   HCT 46.3 10/19/2017                         CA Markers No results found for: CEA, CA125, LABCA2                      Endocrine Markers No results found for: TSH, FREET4, TESTOFREE, TESTOSTERONE, ESTRADIOL, ESTRADIOLPCT, ESTRADIOLFRE                      Note: Lab results reviewed.  Recent Diagnostic Imaging Results  DG C-Arm 1-60 Min-No Report Fluoroscopy was utilized by the requesting physician.  No radiographic  interpretation.   Complexity Note: Imaging results reviewed. Results shared with Ryan [name], using Layman's terms.                         Meds   Current Outpatient Medications:  .  acetaminophen (TYLENOL) 325 MG tablet, Take 650 mg by mouth every 6 (six) hours as needed  for mild pain or headache. , Disp: , Rfl:  .  albuterol (PROVENTIL HFA;VENTOLIN HFA) 108 (90 BASE) MCG/ACT inhaler, Inhale 1-2 puffs into the lungs every 6 (six) hours as needed for wheezing or shortness of breath. , Disp: , Rfl:  .  ALPRAZolam (XANAX) 1 MG tablet, Take 1 mg by mouth at bedtime as needed for anxiety., Disp: , Rfl:  .  cetirizine (ZYRTEC) 10 MG tablet, Take 10 mg by mouth daily., Disp: , Rfl:  .  cyclobenzaprine (FLEXERIL) 10 MG tablet, Take 10 mg by mouth as needed. , Disp: , Rfl:  .  fluticasone (FLONASE) 50 MCG/ACT nasal spray, Place into both nostrils daily., Disp: , Rfl:  .  gabapentin (NEURONTIN) 600 MG tablet, 600 mg qAM, 600 mg qPM, 1200 mg qhs, Disp: 90 tablet, Rfl: 2 .  lisinopril (PRINIVIL,ZESTRIL) 20 MG tablet, Take 10 mg by mouth daily., Disp: , Rfl:  .  Multiple Vitamin (MULTIVITAMIN) tablet, Take 1 tablet by mouth daily., Disp: , Rfl:  .  omeprazole (PRILOSEC) 40 MG capsule, Take 40 mg by mouth daily., Disp: , Rfl:  .  sildenafil (REVATIO) 20 MG tablet, Take 20 mg by mouth at bedtime as needed., Disp: , Rfl:  .  tamsulosin (FLOMAX) 0.4 MG CAPS capsule, Take 0.8 mg by mouth daily. , Disp: , Rfl:   ROS  Constitutional: Denies any fever or chills Gastrointestinal: No reported hemesis, hematochezia, vomiting, or acute GI distress Musculoskeletal: Denies any acute onset joint swelling, redness, loss of ROM, or weakness Neurological:  No reported episodes of acute onset apraxia, aphasia, dysarthria, agnosia, amnesia, paralysis, loss of coordination, or loss of consciousness  Allergies  Ryan [name] has No Known Allergies.  PFSH  Drug: Ryan [name]  reports no history of drug use. Alcohol:  reports no history of alcohol use. Tobacco:  reports that he has been smoking cigarettes. He has a 60.00 pack-year smoking history. He has never used smokeless tobacco. Medical:  has a past medical history of Arthritis, Asthma, Chronic kidney disease, COPD (chronic obstructive pulmonary  disease) ([place]), Dyspnea, GERD (gastroesophageal reflux disease), Hyperlipidemia, Hypertension, Lower urinary tract symptoms (LUTS), Wears dentures, and Wears hearing aid. Surgical: Ryan [name]  has a past surgical history that includes Eye surgery; Hernia repair; Transforaminal lumbar interbody fusion (tlif) with pedicle screw fixation 1 level (Right, 06/18/2014); Myringotomy with tube placement (Bilateral, 03/28/2015); Back surgery; Esophagogastroduodenoscopy (egd) with propofol (N/A, 10/24/2015); Cataract extraction w/PHACO (Left, 01/06/2016); Tonsillectomy; and Colonoscopy with propofol (N/A, 04/19/2016). Family: family history includes Arthritis in his mother; Diabetes in his father; Hypertension in his father and mother.  Constitutional Exam  General appearance: Well nourished, well developed, and well hydrated. In no apparent acute distress Vitals:   03/07/18 1007  BP: 113/60  Pulse: 77  Resp: 16  Temp: 97.7 F (36.5 C)  TempSrc: Oral  SpO2: 96%  Weight: 185 lb (83.9 kg)  Height: _0  (1.702 m)   BMI Assessment: Estimated body mass index is 28.98 kg/m as calculated from the following:   Height as of this encounter: _1  (1.702 m).   Weight as of this encounter: 185 lb (83.9 kg).  BMI interpretation table: BMI level Category Range association with higher incidence of chronic pain  <18 kg/m2 Underweight   18.5-24.9 kg/m2 Ideal body weight   25-29.9 kg/m2 Overweight Increased incidence by 20%  30-34.9 kg/m2 Obese (Class I) Increased incidence by 68%  35-39.9 kg/m2 Severe obesity (Class II) Increased incidence by 136%  >40 kg/m2 Extreme obesity (Class III) Increased incidence by 254%   Patient's current BMI Ideal Body weight  Body mass index is 28.98 kg/m. Ideal body weight: 66.1 kg (145 lb 11.6 oz) Adjusted ideal body weight: 73.2 kg (161 lb 6.9 oz)   BMI Readings from Last 4 Encounters:  03/07/18 28.98 kg/m  02/08/18 28.98 kg/m  01/12/18 27.41 kg/m  12/20/17 27.41 kg/m    Wt Readings from Last 4 Encounters:  03/07/18 185 lb (83.9 kg)  02/08/18 185 lb (83.9 kg)  01/12/18 175 lb (79.4 kg)  12/20/17 175 lb (79.4 kg)  Psych/Mental status: Alert, oriented x 3 (person, place, & time)       Eyes: PERLA Respiratory: No evidence of acute respiratory distress  Cervical Spine Area Exam  Skin & Axial Inspection: No masses, redness, edema, swelling, or associated skin lesions Alignment: Symmetrical Functional ROM: Unrestricted ROM      Stability: No instability detected Muscle Tone/Strength: Functionally intact. No obvious neuro-muscular anomalies detected. Sensory (Neurological): Unimpaired Palpation: No palpable anomalies              Upper Extremity (UE) Exam    Side: Right upper extremity  Side: Left upper extremity  Skin & Extremity Inspection: Skin color, temperature, and hair growth are WNL. No peripheral edema or cyanosis. No masses, redness, swelling, asymmetry, or associated skin lesions. No contractures.  Skin & Extremity Inspection: Skin color, temperature, and hair growth are WNL. No peripheral edema or cyanosis. No masses, redness, swelling, asymmetry, or associated skin lesions. No contractures.  Functional ROM: Unrestricted  ROM          Functional ROM: Unrestricted ROM          Muscle Tone/Strength: Functionally intact. No obvious neuro-muscular anomalies detected.  Muscle Tone/Strength: Functionally intact. No obvious neuro-muscular anomalies detected.  Sensory (Neurological): Unimpaired          Sensory (Neurological): Unimpaired          Palpation: No palpable anomalies              Palpation: No palpable anomalies              Provocative Test(s):  Phalen's test: deferred Tinel's test: deferred Apley's scratch test (touch opposite shoulder):  Action 1 (Across chest): deferred Action 2 (Overhead): deferred Action 3 (LB reach): deferred   Provocative Test(s):  Phalen's test: deferred Tinel's test: deferred Apley's scratch test (touch  opposite shoulder):  Action 1 (Across chest): deferred Action 2 (Overhead): deferred Action 3 (LB reach): deferred    Thoracic Spine Area Exam  Skin & Axial Inspection: No masses, redness, or swelling Alignment: Symmetrical Functional ROM: Unrestricted ROM Stability: No instability detected Muscle Tone/Strength: Functionally intact. No obvious neuro-muscular anomalies detected. Sensory (Neurological): Unimpaired Muscle strength & Tone: No palpable anomalies  Lumbar Spine Area Exam  Skin & Axial Inspection: Well healed scar from previous spine surgery detected Alignment: Symmetrical Functional ROM: Decreased ROM affecting primarily the left Stability: No instability detected Muscle Tone/Strength: Functionally intact. No obvious neuro-muscular anomalies detected. Sensory (Neurological): Musculoskeletal pain pattern Palpation: Complains of area being tender to palpation Left Fist Percussion Test Provocative Tests: Hyperextension/rotation test: (+) on the left for facet joint pain. Lumbar quadrant test (Kemp's test): (+) on the left for facet joint pain. Lateral bending test: deferred today       Patrick's Maneuver: deferred today                   FABER* test: deferred today                   S-I anterior distraction/compression test: deferred today         S-I lateral compression test: deferred today         S-I Thigh-thrust test: deferred today         S-I Gaenslen's test: deferred today         *(Flexion, ABduction and External Rotation)  Gait & Posture Assessment  Ambulation: Unassisted Gait: Relatively normal for age and body habitus Posture: WNL   Lower Extremity Exam    Side: Right lower extremity  Side: Left lower extremity  Stability: No instability observed          Stability: No instability observed          Skin & Extremity Inspection: Skin color, temperature, and hair growth are WNL. No peripheral edema or cyanosis. No masses, redness, swelling, asymmetry, or  associated skin lesions. No contractures.  Skin & Extremity Inspection: Skin color, temperature, and hair growth are WNL. No peripheral edema or cyanosis. No masses, redness, swelling, asymmetry, or associated skin lesions. No contractures.  Functional ROM: Unrestricted ROM                  Functional ROM: Unrestricted ROM                  Muscle Tone/Strength: Functionally intact. No obvious neuro-muscular anomalies detected.  Muscle Tone/Strength: Functionally intact. No obvious neuro-muscular anomalies detected.  Sensory (Neurological): Unimpaired  Sensory (Neurological): Unimpaired        DTR: Patellar: deferred today Achilles: deferred today Plantar: deferred today  DTR: Patellar: deferred today Achilles: deferred today Plantar: deferred today  Palpation: No palpable anomalies  Palpation: No palpable anomalies   Assessment   Status Diagnosis  Having a Flare-up Persistent Persistent 1. Lumbar spondylosis   2. Failed back surgical syndrome   3. Spinal stenosis, lumbar region, with neurogenic claudication   4. DDD (degenerative disc disease), lumbosacral   5. Chronic pain syndrome   6. History of lumbar fusion   7. Lumbar radiculopathy (R L2/3)      Updated Problems: Problem  Lumbar Spondylosis  Failed Back Surgical Syndrome  History of Lumbar Fusion   Patient follows up for postprocedural evaluation.  No significant benefit after lumbar epidural steroid injection.  Patient is endorsing axial low back pain that is worse on the left and aggravated by facet loading and lateral extension.  Ryan [name] has a history of greater than 3 months of moderate to severe pain which is resulted in functional impairment.  The patient has tried various conservative therapeutic options such as NSAIDs, Tylenol, muscle relaxants, physical therapy which was inadequately effective.  Patient's pain is predominantly axial with physical exam findings suggestive of facet arthropathy.  Lumbar  facet medial branch nerve blocks were discussed with the patient.  Risks and benefits were reviewed.  Patient would like to proceed with LEFT  L3, L4, L5, S1 medial branch nerve block.  Plan: -LEFT  L3, L4, L5, S1 medial branch nerve block with sedation.  Lab-work, procedure(s), and/or referral(s): Orders Placed This Encounter  Procedures  . LUMBAR FACET(MEDIAL BRANCH NERVE BLOCK) MBNB    Interventional history: Right L1-L2 ESI not effective.  Provider-requested follow-up: Return in about 2 weeks (around 03/21/2018) for Procedure.  Time Note: Greater than 50% of the 25 minute(s) of face-to-face time spent with Ryan [name], was spent in counseling/coordination of care regarding: Ryan [name] primary cause of pain, the treatment plan, treatment alternatives, the risks and possible complications of proposed treatment, going over the informed consent, the results, interpretation and significance of  his recent diagnostic interventional treatment(s) and realistic expectations.  Future Appointments  Date Time Provider [place]  03/20/2018 10:30 AM [name], MD [facility] None    Primary Care Physician: [facility], [place] Location: [facility] Outpatient Pain Management Facility Note by: [name], M.D Date: 03/07/2018; Time: 12:17 PM  Patient Instructions  ____________________________________________________________________________________________  Preparing for Procedure with Sedation  Instructions: . Oral Intake: Do not eat or drink anything for at least 8 hours prior to your procedure. . Transportation: Public transportation is not allowed. Bring an adult driver. The driver must be physically present in our waiting room before any procedure can be started. Marland Kitchen Physical Assistance: Bring an adult physically capable of assisting you, in the event you need help. This adult should keep you company at home for at least 6 hours after the procedure. . Blood Pressure  Medicine: Take your blood pressure medicine with a sip of water the morning of the procedure. . Blood thinners: Notify our staff if you are taking any blood thinners. Depending on which one you take, there will be specific instructions on how and when to stop it. . Diabetics on insulin: Notify the staff so that you can be scheduled 1st case in the morning. If your diabetes requires high dose insulin, take only  of your normal insulin dose the morning of the procedure and  notify the staff that you have done so. . Preventing infections: Shower with an antibacterial soap the morning of your procedure. . Build-up your immune system: Take 1000 mg of Vitamin C with every meal (3 times a day) the day prior to your procedure. Marland Kitchen Antibiotics: Inform the staff if you have a condition or reason that requires you to take antibiotics before dental procedures. . Pregnancy: If you are pregnant, call and cancel the procedure. . Sickness: If you have a cold, fever, or any active infections, call and cancel the procedure. . Arrival: You must be in the facility at least 30 minutes prior to your scheduled procedure. . Children: Do not bring children with you. . Dress appropriately: Bring dark clothing that you would not mind if they get stained. . Valuables: Do not bring any jewelry or valuables.  Procedure appointments are reserved for interventional treatments only. Marland Kitchen No Prescription Refills. . No medication changes will be discussed during procedure appointments. . No disability issues will be discussed.  Reasons to call and reschedule or cancel your procedure: (Following these recommendations will minimize the risk of a serious complication.) . Surgeries: Avoid having procedures within 2 weeks of any surgery. (Avoid for 2 weeks before or after any surgery). . Flu Shots: Avoid having procedures within 2 weeks of a flu shots or . (Avoid for 2 weeks before or after immunizations). . Barium: Avoid having a  procedure within 7-10 days after having had a radiological study involving the use of radiological contrast. (Myelograms, Barium swallow or enema study). . Heart attacks: Avoid any elective procedures or surgeries for the initial 6 months after a "Myocardial Infarction" (Heart Attack). . Blood thinners: It is imperative that you stop these medications before procedures. Let us know if you if you take any blood thinner.  . Infection: Avoid procedures during or within two weeks of an infection (including chest colds or gastrointestinal problems). Symptoms associated with infections include: Localized redness, fever, chills, night sweats or profuse sweating, burning sensation when voiding, cough, congestion, stuffiness, runny nose, sore throat, diarrhea, nausea, vomiting, cold or Flu symptoms, recent or current infections. It is specially important if the infection is over the area that we intend to treat. Marland Kitchen Heart and lung problems: Symptoms that may suggest an active cardiopulmonary problem include: cough, chest pain, breathing difficulties or shortness of breath, dizziness, ankle swelling, uncontrolled high or unusually low blood pressure, and/or palpitations. If you are experiencing any of these symptoms, cancel your procedure and contact your primary care physician for an evaluation.  Remember:  Regular Business hours are:  Monday to Thursday 8:00 AM to 4:00 PM  Provider's Schedule: Milinda Pointer, MD:  Procedure days: Tuesday and Thursday 7:30 AM to 4:00 PM  [name], MD:  Procedure days: Monday and Wednesday 7:30 AM to 4:00 PM ____________________________________________________________________________________________

## 2018-03-20 ENCOUNTER — Ambulatory Visit: Payer: Medicaid Other | Admitting: Student in an Organized Health Care Education/Training Program

## 2018-03-20 ENCOUNTER — Encounter: Payer: Self-pay | Admitting: Student in an Organized Health Care Education/Training Program

## 2018-03-20 ENCOUNTER — Ambulatory Visit
Admission: RE | Admit: 2018-03-20 | Discharge: 2018-03-20 | Disposition: A | Discharge status: Home / Self Care | Payer: Medicaid Other | Source: Ambulatory Visit | Attending: Student in an Organized Health Care Education/Training Program | Admitting: Student in an Organized Health Care Education/Training Program

## 2018-03-20 ENCOUNTER — Other Ambulatory Visit: Payer: Self-pay

## 2018-03-20 VITALS — BP 127/90 | HR 75 | Temp 98.1°F | Resp 24 | Ht 67.0 in | Wt 180.0 lb

## 2018-03-20 DIAGNOSIS — M47816 Spondylosis without myelopathy or radiculopathy, lumbar region: Secondary | ICD-10-CM

## 2018-03-20 MED ORDER — FENTANYL CITRATE (PF) 100 MCG/2ML IJ SOLN
INTRAMUSCULAR | Status: AC
  Filled 2018-03-20: qty 2

## 2018-03-20 MED ORDER — LACTATED RINGERS IV SOLN
1000.0000 mL | Freq: Once | INTRAVENOUS | Status: AC
  Administered 2018-03-20: 1000 mL via INTRAVENOUS

## 2018-03-20 MED ORDER — FENTANYL CITRATE (PF) 100 MCG/2ML IJ SOLN
25.0000 ug | INTRAMUSCULAR | Status: DC | PRN
  Administered 2018-03-20: 100 ug via INTRAVENOUS

## 2018-03-20 MED ORDER — DEXAMETHASONE SODIUM PHOSPHATE 10 MG/ML IJ SOLN
INTRAMUSCULAR | Status: AC
  Filled 2018-03-20: qty 1

## 2018-03-20 MED ORDER — LIDOCAINE HCL 2 % IJ SOLN
20.0000 mL | Freq: Once | INTRAMUSCULAR | Status: AC
  Administered 2018-03-20: 400 mg

## 2018-03-20 MED ORDER — ROPIVACAINE HCL 2 MG/ML IJ SOLN
10.0000 mL | Freq: Once | INTRAMUSCULAR | Status: AC
  Administered 2018-03-20: 10 mL
  Filled 2018-03-20: qty 10

## 2018-03-20 MED ORDER — DEXAMETHASONE SODIUM PHOSPHATE 10 MG/ML IJ SOLN
10.0000 mg | Freq: Once | INTRAMUSCULAR | Status: AC
  Administered 2018-03-20: 10 mg

## 2018-03-20 MED ORDER — DEXAMETHASONE SODIUM PHOSPHATE 10 MG/ML IJ SOLN
10.0000 mg | Freq: Once | INTRAMUSCULAR | Status: AC
  Administered 2018-03-20: 10 mg
  Filled 2018-03-20: qty 1

## 2018-03-20 MED ORDER — ROPIVACAINE HCL 2 MG/ML IJ SOLN
INTRAMUSCULAR | Status: AC
  Filled 2018-03-20: qty 10

## 2018-03-20 MED ORDER — LIDOCAINE HCL 2 % IJ SOLN
INTRAMUSCULAR | Status: AC
  Filled 2018-03-20: qty 20

## 2018-03-20 NOTE — Progress Notes (Signed)
Safety precautions to be maintained throughout the outpatient stay will include: orient to surroundings, keep bed in low position, maintain call bell within reach at all times, provide assistance with transfer out of bed and ambulation.  

## 2018-03-20 NOTE — Progress Notes (Signed)
Patient's Name: Ryan [name]  MRN: [id number]  Referring Provider: [facility]  DOB: April 08, 1955  [facility]P: [facility]  DOS: 03/20/2018  Note by: Edward Jolly, MD  Service setting: Ambulatory outpatient  Specialty: Interventional Pain Management  Patient type: Established  Location: [facility] (AMB) Pain Management Facility  Visit type: Interventional Procedure   Primary Reason for Visit: Interventional Pain Management Treatment. CC: Back Pain and Procedure  Procedure:          Anesthesia, Analgesia, Anxiolysis:  Type: Lumbar Facet, Medial Branch Block(s) #1  Primary Purpose: Diagnostic Region: Posterolateral Lumbosacral Spine Level: L3, L4, L5, & S1 Medial Branch Level(s). Injecting these levels blocks the L3-4, L4-5, and L5-S1 lumbar facet joints. Laterality: Left  Type: Moderate (Conscious) Sedation combined with Local Anesthesia Indication(s): Analgesia and Anxiety Route: Intravenous (IV) IV Access: Secured Sedation: Meaningful verbal contact was maintained at all times during the procedure  Local Anesthetic: Lidocaine 1-2%  Position: Prone   Indications: 1. Lumbar spondylosis    Pain Score: Pre-procedure: 6 /10 Post-procedure: 0-No pain/10  Pre-op Assessment:  Ryan [name] is a 63 y.o. (year old), male patient, seen today for interventional treatment. He  has a past surgical history that includes Eye surgery; Hernia repair; Transforaminal lumbar interbody fusion (tlif) with pedicle screw fixation 1 level (Right, 06/18/2014); Myringotomy with tube placement (Bilateral, 03/28/2015); Back surgery; Esophagogastroduodenoscopy (egd) with propofol (N/A, 10/24/2015); Cataract extraction w/PHACO (Left, 01/06/2016); Tonsillectomy; and Colonoscopy with propofol (N/A, 04/19/2016). Ryan [name] has a current medication list which includes the following prescription(s): acetaminophen, albuterol, alprazolam, cetirizine, cyclobenzaprine, fluticasone, gabapentin, lisinopril,  multivitamin, omeprazole, sildenafil, and tamsulosin, and the following Facility-Administered Medications: fentanyl. His primarily concern today is the Back Pain and Procedure  Initial Vital Signs:  Pulse/HCG Rate: 75ECG Heart Rate: 82 Temp: 97.8 F (36.6 C) Resp: 18 BP: 121/66 SpO2: 98 %  BMI: Estimated body mass index is 28.19 kg/m as calculated from the following:   Height as of this encounter:  (1.702 m).   Weight as of this encounter: 180 lb (81.6 kg).  Risk Assessment: Allergies: Reviewed. He has No Known Allergies.  Allergy Precautions: None required Coagulopathies: Reviewed. None identified.  Blood-thinner therapy: None at this time Active Infection(s): Reviewed. None identified. Ryan [name] is afebrile  Site Confirmation: [name] was asked to confirm the procedure and laterality before marking the site Procedure checklist: Completed Consent: Before the procedure and under the influence of no sedative(s), amnesic(s), or anxiolytics, the patient was informed of the treatment options, risks and possible complications. To fulfill our ethical and legal obligations, as recommended by the American Medical Association's Code of Ethics, I have informed the patient of my clinical impression; the nature and purpose of the treatment or procedure; the risks, benefits, and possible complications of the intervention; the alternatives, including doing nothing; the risk(s) and benefit(s) of the alternative treatment(s) or procedure(s); and the risk(s) and benefit(s) of doing nothing. The patient was provided information about the general risks and possible complications associated with the procedure. These may include, but are not limited to: failure to achieve desired goals, infection, bleeding, organ or nerve damage, allergic reactions, paralysis, and death. In addition, the patient was informed of those risks and complications associated to Spine-related procedures, such as failure to  decrease pain; infection (i.e.: Meningitis, epidural or intraspinal abscess); bleeding (i.e.: epidural hematoma, subarachnoid hemorrhage, or any other type of intraspinal or peri-dural bleeding); organ or nerve damage (i.e.: Any type of peripheral nerve, nerve root,  or spinal cord injury) with subsequent damage to sensory, motor, and/or autonomic systems, resulting in permanent pain, numbness, and/or weakness of one or several areas of the body; allergic reactions; (i.e.: anaphylactic reaction); and/or death. Furthermore, the patient was informed of those risks and complications associated with the medications. These include, but are not limited to: allergic reactions (i.e.: anaphylactic or anaphylactoid reaction(s)); adrenal axis suppression; blood sugar elevation that in diabetics may result in ketoacidosis or comma; water retention that in patients with history of congestive heart failure may result in shortness of breath, pulmonary edema, and decompensation with resultant heart failure; weight gain; swelling or edema; medication-induced neural toxicity; particulate matter embolism and blood vessel occlusion with resultant organ, and/or nervous system infarction; and/or aseptic necrosis of one or more joints. Finally, the patient was informed that Medicine is not an exact science; therefore, there is also the possibility of unforeseen or unpredictable risks and/or possible complications that may result in a catastrophic outcome. The patient indicated having understood very clearly. We have given the patient no guarantees and we have made no promises. Enough time was given to the patient to ask questions, all of which were answered to the patient's satisfaction. Ryan [name] has indicated that he wanted to continue with the procedure. Attestation: I, the ordering provider, attest that I have discussed with the patient the benefits, risks, side-effects, alternatives, likelihood of achieving goals, and potential  problems during recovery for the procedure that I have provided informed consent. Date  Time: 03/20/2018 10:19 AM  Pre-Procedure Preparation:  Monitoring: As per clinic protocol. Respiration, ETCO2, SpO2, BP, heart rate and rhythm monitor placed and checked for adequate function Safety Precautions: Patient was assessed for positional comfort and pressure points before starting the procedure. Time-out: I initiated and conducted the "Time-out" before starting the procedure, as per protocol. The patient was asked to participate by confirming the accuracy of the "Time Out" information. Verification of the correct person, site, and procedure were performed and confirmed by me, the nursing staff, and the patient. "Time-out" conducted as per Joint Commission's Universal Protocol (UP.01.01.01). Time: 1123  Description of Procedure:          Laterality: Left Levels:  L3, L4, L5, & S1 Medial Branch Level(s) Area Prepped: Posterior Lumbosacral Region Prepping solution: ChloraPrep (2% chlorhexidine gluconate and 70% isopropyl alcohol) Safety Precautions: Aspiration looking for blood return was conducted prior to all injections. At no point did we inject any substances, as a needle was being advanced. Before injecting, the patient was told to immediately notify me if he was experiencing any new onset of "ringing in the ears, or metallic taste in the mouth". No attempts were made at seeking any paresthesias. Safe injection practices and needle disposal techniques used. Medications properly checked for expiration dates. SDV (single dose vial) medications used. After the completion of the procedure, all disposable equipment used was discarded in the proper designated medical waste containers. Local Anesthesia: Protocol guidelines were followed. The patient was positioned over the fluoroscopy table. The area was prepped in the usual manner. The time-out was completed. The target area was identified using fluoroscopy.  A 12-in long, straight, sterile hemostat was used with fluoroscopic guidance to locate the targets for each level blocked. Once located, the skin was marked with an approved surgical skin marker. Once all sites were marked, the skin (epidermis, dermis, and hypodermis), as well as deeper tissues (fat, connective tissue and muscle) were infiltrated with a Couts amount of a short-acting local anesthetic, loaded on  a 10cc syringe with a 25G, 1.5-in  Needle. An appropriate amount of time was allowed for local anesthetics to take effect before proceeding to the next step. Local Anesthetic: Lidocaine 2.0% The unused portion of the local anesthetic was discarded in the proper designated containers. Technical explanation of process:   L3 Medial Branch Nerve Block (MBB): The target area for the L3 medial branch is at the junction of the postero-lateral aspect of the superior articular process and the superior, posterior, and medial edge of the transverse process of L4. Under fluoroscopic guidance, a Quincke needle was inserted until contact was made with os over the superior postero-lateral aspect of the pedicular shadow (target area). After negative aspiration for blood, 2.5 cc  mL of the nerve block solution was injected without difficulty or complication. The needle was removed intact. L4 Medial Branch Nerve Block (MBB): The target area for the L4 medial branch is at the junction of the postero-lateral aspect of the superior articular process and the superior, posterior, and medial edge of the transverse process of L5. Under fluoroscopic guidance, a Quincke needle was inserted until contact was made with os over the superior postero-lateral aspect of the pedicular shadow (target area). After negative aspiration for blood,2.5 cc mL of the nerve block solution was injected without difficulty or complication. The needle was removed intact. L5 Medial Branch Nerve Block (MBB): The target area for the L5 medial branch is  at the junction of the postero-lateral aspect of the superior articular process and the superior, posterior, and medial edge of the sacral ala. Under fluoroscopic guidance, a Quincke needle was inserted until contact was made with os over the superior postero-lateral aspect of the pedicular shadow (target area). After negative aspiration for blood, 2.5 cc  mL of the nerve block solution was injected without difficulty or complication. The needle was removed intact. S1 Medial Branch Nerve Block (MBB): The target area for the S1 medial branch is at the posterior and inferior 6 o'clock position of the L5-S1 facet joint. Under fluoroscopic guidance, the Quincke needle inserted for the L5 MBB was redirected until contact was made with os over the inferior and postero aspect of the sacrum, at the 6 o' clock position under the L5-S1 facet joint (Target area). After negative aspiration for blood, 2.5 cc mL of the nerve block solution was injected without difficulty or complication. The needle was removed intact.  Nerve block solution: 10 cc solution made of 8 cc of 0.2% ropivacaine, 2 cc of Decadron 10 mg/cc.  2.5 cc injected at each level above on the left.    Procedural Needles: 22-gauge, 3.5-inch, Quincke needles used for all levels.  Once the entire procedure was completed, the treated area was cleaned, making sure to leave some of the prepping solution back to take advantage of its long term bactericidal properties.   Illustration of the posterior view of the lumbar spine and the posterior neural structures. Laminae of L2 through S1 are labeled. DPRL5, dorsal primary ramus of L5; DPRS1, dorsal primary ramus of S1; DPR3, dorsal primary ramus of L3; FJ, facet (zygapophyseal) joint L3-L4; I, inferior articular process of L4; LB1, lateral branch of dorsal primary ramus of L1; IAB, inferior articular branches from L3 medial branch (supplies L4-L5 facet joint); IBP, intermediate branch plexus; MB3, medial branch of  dorsal primary ramus of L3; NR3, third lumbar nerve root; S, superior articular process of L5; SAB, superior articular branches from L4 (supplies L4-5 facet joint also); TP3, transverse process of  L3.  Vitals:   03/20/18 1143 03/20/18 1153 03/20/18 1202 03/20/18 1204  BP: (!) 147/74 138/87 127/90   Pulse:      Resp: (!) 22 (!) 23 (!) 24   Temp: 98.3 F (36.8 C)   98.1 F (36.7 C)  TempSrc: Temporal   Temporal  SpO2: 95% 98% 99%   Weight:      Height:         Start Time: 1123 hrs. End Time: 1133 hrs.  Imaging Guidance (Spinal):          Type of Imaging Technique: Fluoroscopy Guidance (Spinal) Indication(s): Assistance in needle guidance and placement for procedures requiring needle placement in or near specific anatomical locations not easily accessible without such assistance. Exposure Time: Please see nurses notes. Contrast: None used. Fluoroscopic Guidance: I was personally present during the use of fluoroscopy. "Tunnel Vision Technique" used to obtain the best possible view of the target area. Parallax error corrected before commencing the procedure. "Direction-depth-direction" technique used to introduce the needle under continuous pulsed fluoroscopy. Once target was reached, antero-posterior, oblique, and lateral fluoroscopic projection used confirm needle placement in all planes. Images permanently stored in EMR. Interpretation: No contrast injected. I personally interpreted the imaging intraoperatively. Adequate needle placement confirmed in multiple planes. Permanent images saved into the patient's record.  Antibiotic Prophylaxis:   Anti-infectives (From admission, onward)   None     Indication(s): None identified  Post-operative Assessment:  Post-procedure Vital Signs:  Pulse/HCG Rate: 7577 Temp: 98.1 F (36.7 C) Resp: (!) 24 BP: 127/90 SpO2: 99 %  EBL: None  Complications: No immediate post-treatment complications observed by team, or reported by  patient.  Note: The patient tolerated the entire procedure well. A repeat set of vitals were taken after the procedure and the patient was kept under observation following institutional policy, for this type of procedure. Post-procedural neurological assessment was performed, showing return to baseline, prior to discharge. The patient was provided with post-procedure discharge instructions, including a section on how to identify potential problems. Should any problems arise concerning this procedure, the patient was given instructions to immediately contact us, at any time, without hesitation. In any case, we plan to contact the patient by telephone for a follow-up status report regarding this interventional procedure.  Comments:  No additional relevant information.  Plan of Care   Imaging Orders     DG C-Arm 1-60 Min-No Report Procedure Orders    No procedure(s) ordered today    Medications ordered for procedure: Meds ordered this encounter  Medications  . lactated ringers infusion 1,000 mL  . fentaNYL (SUBLIMAZE) injection 25-100 mcg    Make sure Narcan is available in the pyxis when using this medication. In the event of respiratory depression (RR< 8/min): Titrate NARCAN (naloxone) in increments of 0.1 to 0.2 mg IV at 2-3 minute intervals, until desired degree of reversal.  . ropivacaine (PF) 2 mg/mL (0.2%) (NAROPIN) injection 10 mL  . lidocaine (XYLOCAINE) 2 % (with pres) injection 400 mg  . dexamethasone (DECADRON) injection 10 mg  . dexamethasone (DECADRON) injection 10 mg   Medications administered: We administered lactated ringers, fentaNYL, ropivacaine (PF) 2 mg/mL (0.2%), lidocaine, dexamethasone, and dexamethasone.  See the medical record for exact dosing, route, and time of administration.  Disposition: Discharge home  Discharge Date & Time: 03/20/2018; 1204 hrs.   Physician-requested Follow-up: Return in about 6 weeks (around 05/01/2018) for Post Procedure  Evaluation.  Future Appointments  Date Time Provider Department Center  05/02/2018 10:15 AM  Edward Jolly, MD [facility] None   Primary Care Physician: [facility] Location: [facility] Note by: Edward Jolly, MD Date: 03/20/2018; Time: 2:10 PM  Disclaimer:  Medicine is not an Visual merchandiser. The only guarantee in medicine is that nothing is guaranteed. It is important to note that the decision to proceed with this intervention was based on the information collected from the patient. The Data and conclusions were drawn from the patient's questionnaire, the interview, and the physical examination. Because the information was provided in large part by the patient, it cannot be guaranteed that it has not been purposely or unconsciously manipulated. Every effort has been made to obtain as much relevant data as possible for this evaluation. It is important to note that the conclusions that lead to this procedure are derived in large part from the available data. Always take into account that the treatment will also be dependent on availability of resources and existing treatment guidelines, considered by other Pain Management Practitioners as being common knowledge and practice, at the time of the intervention. For Medico-Legal purposes, it is also important to point out that variation in procedural techniques and pharmacological choices are the acceptable norm. The indications, contraindications, technique, and results of the above procedure should only be interpreted and judged by a Board-Certified Interventional Pain Specialist with extensive familiarity and expertise in the same exact procedure and technique.

## 2018-03-20 NOTE — Patient Instructions (Signed)
____________________________________________________________________________________________  Post-Procedure Discharge Instructions  Instructions:  Apply ice: Fill a plastic sandwich bag with crushed ice. Cover it with a Lefevre towel and apply to injection site. Apply for 15 minutes then remove x 15 minutes. Repeat sequence on day of procedure, until you go to bed. The purpose is to minimize swelling and discomfort after procedure.  Apply heat: Apply heat to procedure site starting the day following the procedure. The purpose is to treat any soreness and discomfort from the procedure.  Food intake: Start with clear liquids (like water) and advance to regular food, as tolerated.   Physical activities: Keep activities to a minimum for the first 8 hours after the procedure.   Driving: If you have received any sedation, you are not allowed to drive for 24 hours after your procedure.  Blood thinner: Restart your blood thinner 6 hours after your procedure. (Only for those taking blood thinners)  Insulin: As soon as you can eat, you may resume your normal dosing schedule. (Only for those taking insulin)  Infection prevention: Keep procedure site clean and dry.  Post-procedure Pain Diary: Extremely important that this be done correctly and accurately. Recorded information will be used to determine the next step in treatment.  Pain evaluated is that of treated area only. Do not include pain from an untreated area.  Complete every hour, on the hour, for the initial 8 hours. Set an alarm to help you do this part accurately.  Do not go to sleep and have it completed later. It will not be accurate.  Follow-up appointment: Keep your follow-up appointment after the procedure. Usually 2 weeks for most procedures. (6 weeks in the case of radiofrequency.) Bring you pain diary.   Expect:  From numbing medicine (AKA: Local Anesthetics): Numbness or decrease in pain.  Onset: Full effect within 15  minutes of injected.  Duration: It will depend on the type of local anesthetic used. On the average, 1 to 8 hours.   From steroids: Decrease in swelling or inflammation. Once inflammation is improved, relief of the pain will follow.  Onset of benefits: Depends on the amount of swelling present. The more swelling, the longer it will take for the benefits to be seen. In some cases, up to 10 days.  Duration: Steroids will stay in the system x 2 weeks. Duration of benefits will depend on multiple posibilities including persistent irritating factors.  Occasional side-effects: Facial flushing (red, warm cheeks) , cramps (if present, drink Gatorade and take over-the-counter Magnesium 450-500 mg once to twice a day).  From procedure: Some discomfort is to be expected once the numbing medicine wears off. This should be minimal if ice and heat are applied as instructed.  Call if:  You experience numbness and weakness that gets worse with time, as opposed to wearing off.  New onset bowel or bladder incontinence. (This applies to Spinal procedures only)  Emergency Numbers:  Durning business hours (Monday - Thursday, 8:00 AM - 4:00 PM) (Friday, 9:00 AM - 12:00 Noon): [phone]  After hours: [phone] ____________________________________________________________________________________________    

## 2018-03-21 ENCOUNTER — Telehealth: Payer: Self-pay

## 2018-03-21 NOTE — Telephone Encounter (Signed)
States was a little sore yesterday but will feel better soon. Patient mentioned that he wasn't real relaxed yesterday during the procedure and his muscles were tensed. Instructed to call if needed.

## 2018-04-17 DIAGNOSIS — F411 Generalized anxiety disorder: Secondary | ICD-10-CM | POA: Diagnosis not present

## 2018-05-02 ENCOUNTER — Other Ambulatory Visit: Payer: Self-pay

## 2018-05-02 ENCOUNTER — Ambulatory Visit
Payer: Medicaid Other | Attending: Student in an Organized Health Care Education/Training Program | Admitting: Student in an Organized Health Care Education/Training Program

## 2018-05-02 DIAGNOSIS — M47816 Spondylosis without myelopathy or radiculopathy, lumbar region: Secondary | ICD-10-CM

## 2018-05-02 DIAGNOSIS — M961 Postlaminectomy syndrome, not elsewhere classified: Secondary | ICD-10-CM

## 2018-05-02 DIAGNOSIS — F411 Generalized anxiety disorder: Secondary | ICD-10-CM | POA: Diagnosis not present

## 2018-05-02 DIAGNOSIS — G894 Chronic pain syndrome: Secondary | ICD-10-CM | POA: Diagnosis not present

## 2018-05-02 NOTE — Progress Notes (Signed)
Pain Management Encounter Note - Virtual Visit via Telephone Telehealth (real-time audio visits between healthcare provider and patient).  Patient's Phone No. & Preferred Pharmacy:  [phone] (home); There is no such number on file (mobile).; (Preferred) [facility] ([facility] [place] [address] [place]) [place] [address] Phone: [phone] Fax: [phone]   Pre-screening note:  Our staff contacted Mr. Ryan [name] and offered him an "in person", "face-to-face" appointment versus a telephone encounter. He indicated preferring the telephone encounter, at this time.  Reason for Virtual Visit: COVID-19*  Social distancing based on CDC and AMA recommendations.   I contacted Ryan [name] on 05/02/2018 at 10:36 AM by telephone and clearly identified myself as [name], MD. I verified that I was speaking with the correct person using two identifiers (Name and date of birth: Dec 12, 1955).  Advanced Informed Consent I sought verbal advanced consent from Ryan [name] for telemedicine interactions and virtual visit. I informed Ryan [name] of the security and privacy concerns, risks, and limitations associated with performing an evaluation and management service by telephone. I also informed Ryan [name] of the availability of "in person" appointments and I informed him of the possibility of a patient responsible charge related to this service. Ryan [name] expressed understanding and agreed to proceed.   Historic Elements   Mr. ENOCH [name] is a 63 y.o. year old, male patient evaluated today after his last encounter by our practice on 03/21/2018. Mr. Erney  has a past medical history of Arthritis, Asthma, Chronic kidney disease, COPD (chronic obstructive pulmonary disease) ([place]), Dyspnea, GERD (gastroesophageal reflux disease), Hyperlipidemia, Hypertension, Lower urinary tract symptoms (LUTS), Wears dentures, and Wears hearing aid. He also   has a past surgical history that includes Eye surgery; Hernia repair; Transforaminal lumbar interbody fusion (tlif) with pedicle screw fixation 1 level (Right, 06/18/2014); Myringotomy with tube placement (Bilateral, 03/28/2015); Back surgery; Esophagogastroduodenoscopy (egd) with propofol (N/A, 10/24/2015); Cataract extraction w/PHACO (Left, 01/06/2016); Tonsillectomy; and Colonoscopy with propofol (N/A, 04/19/2016). [name] has a current medication list which includes the following prescription(s): acetaminophen, albuterol, alprazolam, cetirizine, cyclobenzaprine, fluticasone, gabapentin, lisinopril, multivitamin, omeprazole, sildenafil, and tamsulosin. He  reports that he has been smoking cigarettes. He has a 60.00 pack-year smoking history. He has never used smokeless tobacco. He reports that he does not drink alcohol or use drugs. [name] has No Known Allergies.   HPI  I last saw him on 03/20/2018. He is being evaluated for a post-procedure assessment.  Post-Procedure Evaluation  Procedure: Left L3, L4, L5, S1 facet medial branch nerve block #1 Pre-procedure pain level:  6/10 Post-procedure: 0/10          Sedation: Please see nurses note.  Effectiveness during initial hour after procedure(Ultra-Short Term Relief): 100 %  Local anesthetic used: Long-acting (4-6 hours) Effectiveness: Defined as any analgesic benefit obtained secondary to the administration of local anesthetics. This carries significant diagnostic value as to the etiological location, or anatomical origin, of the pain. Duration of benefit is expected to coincide with the duration of the local anesthetic used.  Effectiveness during initial 4-6 hours after procedure(Short-Term Relief): 100%  Long-term benefit: Defined as any relief past the pharmacologic duration of the local anesthetics.  Effectiveness past the initial 6 hours after procedure(Long-Term Relief): 80% for 2.5 weeks then gradually returned to pre-block levels  Current  benefits: Defined as benefit that persist at this time.   Analgesia:  Back to baseline Function: Back to baseline ROM:  Back to baseline   Review of recent tests  DG C-Arm 1-60 Min-No Report Fluoroscopy was utilized by the requesting physician.  No radiographic  interpretation.    Admission on 10/19/2017, Discharged on 10/19/2017  Component Date Value Ref Range Status  . Sodium 10/19/2017 140  135 - 145 mmol/L Final  . Potassium 10/19/2017 4.9  3.5 - 5.1 mmol/L Final  . Chloride 10/19/2017 107  98 - 111 mmol/L Final  . CO2 10/19/2017 23  22 - 32 mmol/L Final  . Glucose, Bld 10/19/2017 103* 70 - 99 mg/dL Final  . BUN 10/19/2017 22  8 - 23 mg/dL Final  . Creatinine, Ser 10/19/2017 0.95  0.61 - 1.24 mg/dL Final  . Calcium 10/19/2017 9.5  8.9 - 10.3 mg/dL Final  . GFR calc non Af Amer 10/19/2017 >60  >60 mL/min Final  . GFR calc Af Amer 10/19/2017 >60  >60 mL/min Final   Comment: (NOTE) The eGFR has been calculated using the CKD EPI equation. This calculation has not been validated in all clinical situations. eGFR's persistently <60 mL/min signify possible Chronic Kidney Disease.   [name] gap 10/19/2017 10  5 - 15 Final   Performed at [facility], [address] [place], [place] [address]  . WBC 10/19/2017 10.4  3.8 - 10.6 K/uL Final  . RBC 10/19/2017 5.02  4.40 - 5.90 MIL/uL Final  . Hemoglobin 10/19/2017 16.0  13.0 - 18.0 g/dL Final  . HCT 10/19/2017 46.3  40.0 - 52.0 % Final  . MCV 10/19/2017 92.1  80.0 - 100.0 fL Final  . MCH 10/19/2017 31.9  26.0 - 34.0 pg Final  . MCHC 10/19/2017 34.6  32.0 - 36.0 g/dL Final  . RDW 10/19/2017 13.9  11.5 - 14.5 % Final  . Platelets 10/19/2017 231  150 - 440 K/uL Final   Performed at [facility], [address] [place], [place] [address]  . Color, Urine 10/19/2017 YELLOW* YELLOW Final  . APPearance 10/19/2017 CLEAR* CLEAR Final  . Specific Gravity, Urine 10/19/2017 1.019  1.005 - 1.030 Final  . pH 10/19/2017 5.0   5.0 - 8.0 Final  . Glucose, UA 10/19/2017 NEGATIVE  NEGATIVE mg/dL Final  . Hgb urine dipstick 10/19/2017 NEGATIVE  NEGATIVE Final  . Bilirubin Urine 10/19/2017 NEGATIVE  NEGATIVE Final  . Ketones, ur 10/19/2017 NEGATIVE  NEGATIVE mg/dL Final  . Protein, ur 10/19/2017 NEGATIVE  NEGATIVE mg/dL Final  . Nitrite 10/19/2017 NEGATIVE  NEGATIVE Final  . Leukocytes, UA 10/19/2017 NEGATIVE  NEGATIVE Final  . RBC / HPF 10/19/2017 0-5  0 - 5 RBC/hpf Final  . WBC, UA 10/19/2017 0-5  0 - 5 WBC/hpf Final  . Bacteria, UA 10/19/2017 NONE SEEN  NONE SEEN Final  . Squamous Epithelial / LPF 10/19/2017 0-5  0 - 5 Final  . Mucus 10/19/2017 PRESENT   Final   Performed at [facility], [address] [place],  [address]  . Total Protein 10/19/2017 7.0  6.5 - 8.1 g/dL Final  . Albumin 10/19/2017 4.2  3.5 - 5.0 g/dL Final  . AST 10/19/2017 18  15 - 41 U/L Final  . ALT 10/19/2017 17  0 - 44 U/L Final  . Alkaline Phosphatase 10/19/2017 104  38 - 126 U/L Final  . Total Bilirubin 10/19/2017 0.3  0.3 - 1.2 mg/dL Final  . Bilirubin, Direct 10/19/2017 0.1  0.0 - 0.2 mg/dL Final  . Indirect Bilirubin 10/19/2017 0.2* 0.3 - 0.9 mg/dL Final   Performed at  [facility], [address], [place], [place] [address]  . Lipase 10/19/2017 77* 11 - 51 U/L Final   Performed at [facility], [address] [place], [place] [address]   Assessment  The primary encounter diagnosis was Lumbar spondylosis. Diagnoses of Failed back surgical syndrome, Lumbar facet arthropathy, and Chronic pain syndrome were also pertinent to this visit.  Plan of Care  I am having Landyn Buckalew. Fetty maintain his acetaminophen, albuterol, tamsulosin, cetirizine, fluticasone, ALPRAZolam, lisinopril, omeprazole, multivitamin, cyclobenzaprine, gabapentin, and sildenafil.  Telephone postprocedural evaluation status post left L3, L4, L5, S1 facet medial branch nerve block performed on 03/20/2018.  Patient endorses  approximately 80% pain relief for 2 and half weeks after diagnostic lumbar facet blocks.  Also endorsed improvement in range of motion and ambulation.  In regards to therapeutic plan, discussed repeating block again and comparing duration of benefit.  Thereafter can consider left L3, L4, L5, S1 radiofrequency ablation for lumbar spondylosis and facet arthropathy.  Given the current COVID-19 situation, limiting elective procedures and will wait to schedule procedure once restrictions for elective procedures are lifted.  This was explained to the patient.  Patient agreed with plan.  Plan: left L3, L4, L5, S1 facet medial branch nerve block #2 with sedation.  Orders:  Orders Placed This Encounter  Procedures  . LUMBAR FACET(MEDIAL BRANCH NERVE BLOCK) MBNB    Standing Status:   Future    Standing Expiration Date:   06/01/2018    Scheduling Instructions:     Side: Left-sided     Level: L3-4, L4-5, & L5-S1 Facets (L3, L4, L5, & S1 Medial Branch Nerves)     Sedation: With Sedation.     Timeframe: ASAA    Order Specific Question:   Where will this procedure be performed?    Answer:   [facility]   Follow-up plan:   Return for Procedure.   I discussed the assessment and treatment plan with the patient. The patient was provided an opportunity to ask questions and all were answered. The patient agreed with the plan and demonstrated an understanding of the instructions.  Patient advised to call back or seek an in-person evaluation if the symptoms or condition worsens.  Total duration of non-face-to-face encounter: 15 minutes.  Note by: [name], MD Date: 05/02/2018; Time: 10:36 AM  Disclaimer:  * Given the special circumstances of the COVID-19 pandemic, the federal government has announced that the Office for Civil Rights (OCR) will exercise its enforcement discretion and will not impose penalties on physicians using telehealth in the event of noncompliance with regulatory requirements  under the [place] (HIPAA) in connection with the good faith provision of telehealth during the ANVBT-66 national public health emergency. ([place])

## 2018-05-23 DIAGNOSIS — F411 Generalized anxiety disorder: Secondary | ICD-10-CM | POA: Diagnosis not present

## 2018-06-01 DIAGNOSIS — F411 Generalized anxiety disorder: Secondary | ICD-10-CM | POA: Diagnosis not present

## 2018-06-01 DIAGNOSIS — F331 Major depressive disorder, recurrent, moderate: Secondary | ICD-10-CM | POA: Diagnosis not present

## 2018-06-21 DIAGNOSIS — F411 Generalized anxiety disorder: Secondary | ICD-10-CM | POA: Diagnosis not present

## 2018-07-12 ENCOUNTER — Other Ambulatory Visit: Admission: RE | Admit: 2018-07-12 | Payer: Medicaid Other | Source: Ambulatory Visit

## 2018-07-13 ENCOUNTER — Other Ambulatory Visit
Admission: RE | Admit: 2018-07-13 | Discharge: 2018-07-13 | Disposition: A | Discharge status: Home / Self Care | Payer: Medicaid Other | Source: Ambulatory Visit | Attending: Student in an Organized Health Care Education/Training Program | Admitting: Student in an Organized Health Care Education/Training Program

## 2018-07-13 ENCOUNTER — Other Ambulatory Visit: Payer: Self-pay

## 2018-07-13 DIAGNOSIS — Z1159 Encounter for screening for other viral diseases: Secondary | ICD-10-CM | POA: Diagnosis not present

## 2018-07-14 LAB — NOVEL CORONAVIRUS, NAA (HOSP ORDER, SEND-OUT TO REF LAB; TAT 18-24 HRS): SARS-CoV-2, NAA: NOT DETECTED

## 2018-07-17 ENCOUNTER — Ambulatory Visit: Payer: Medicaid Other | Admitting: Student in an Organized Health Care Education/Training Program

## 2018-07-18 DIAGNOSIS — F411 Generalized anxiety disorder: Secondary | ICD-10-CM | POA: Diagnosis not present

## 2018-07-21 ENCOUNTER — Other Ambulatory Visit: Admission: RE | Admit: 2018-07-21 | Payer: Medicaid Other | Source: Ambulatory Visit

## 2018-07-26 ENCOUNTER — Ambulatory Visit: Payer: Medicaid Other | Admitting: Student in an Organized Health Care Education/Training Program

## 2018-08-30 DIAGNOSIS — F331 Major depressive disorder, recurrent, moderate: Secondary | ICD-10-CM | POA: Diagnosis not present

## 2018-08-30 DIAGNOSIS — F411 Generalized anxiety disorder: Secondary | ICD-10-CM | POA: Diagnosis not present

## 2018-09-25 DIAGNOSIS — F411 Generalized anxiety disorder: Secondary | ICD-10-CM | POA: Diagnosis not present

## 2018-10-31 DIAGNOSIS — I10 Essential (primary) hypertension: Secondary | ICD-10-CM | POA: Diagnosis not present

## 2018-10-31 DIAGNOSIS — R946 Abnormal results of thyroid function studies: Secondary | ICD-10-CM | POA: Diagnosis not present

## 2018-10-31 DIAGNOSIS — H9209 Otalgia, unspecified ear: Secondary | ICD-10-CM | POA: Diagnosis not present

## 2018-10-31 DIAGNOSIS — J449 Chronic obstructive pulmonary disease, unspecified: Secondary | ICD-10-CM | POA: Diagnosis not present

## 2018-11-06 DIAGNOSIS — H6692 Otitis media, unspecified, left ear: Secondary | ICD-10-CM | POA: Diagnosis not present

## 2018-11-06 DIAGNOSIS — Z23 Encounter for immunization: Secondary | ICD-10-CM | POA: Diagnosis not present

## 2018-11-06 DIAGNOSIS — H6690 Otitis media, unspecified, unspecified ear: Secondary | ICD-10-CM | POA: Insufficient documentation

## 2018-11-14 DIAGNOSIS — H6983 Other specified disorders of Eustachian tube, bilateral: Secondary | ICD-10-CM | POA: Diagnosis not present

## 2018-11-14 DIAGNOSIS — H7202 Central perforation of tympanic membrane, left ear: Secondary | ICD-10-CM | POA: Diagnosis not present

## 2018-11-14 DIAGNOSIS — H6122 Impacted cerumen, left ear: Secondary | ICD-10-CM | POA: Diagnosis not present

## 2018-11-14 DIAGNOSIS — J019 Acute sinusitis, unspecified: Secondary | ICD-10-CM | POA: Diagnosis not present

## 2018-11-28 DIAGNOSIS — H7202 Central perforation of tympanic membrane, left ear: Secondary | ICD-10-CM | POA: Diagnosis not present

## 2018-11-28 DIAGNOSIS — H6501 Acute serous otitis media, right ear: Secondary | ICD-10-CM | POA: Diagnosis not present

## 2018-11-28 DIAGNOSIS — J0101 Acute recurrent maxillary sinusitis: Secondary | ICD-10-CM | POA: Diagnosis not present

## 2018-11-28 DIAGNOSIS — H906 Mixed conductive and sensorineural hearing loss, bilateral: Secondary | ICD-10-CM | POA: Diagnosis not present

## 2018-11-30 DIAGNOSIS — F411 Generalized anxiety disorder: Secondary | ICD-10-CM | POA: Diagnosis not present

## 2018-12-19 DIAGNOSIS — H6983 Other specified disorders of Eustachian tube, bilateral: Secondary | ICD-10-CM | POA: Diagnosis not present

## 2018-12-19 DIAGNOSIS — H6501 Acute serous otitis media, right ear: Secondary | ICD-10-CM | POA: Diagnosis not present

## 2018-12-19 DIAGNOSIS — J0101 Acute recurrent maxillary sinusitis: Secondary | ICD-10-CM | POA: Diagnosis not present

## 2018-12-19 DIAGNOSIS — H906 Mixed conductive and sensorineural hearing loss, bilateral: Secondary | ICD-10-CM | POA: Diagnosis not present

## 2019-01-10 DIAGNOSIS — F411 Generalized anxiety disorder: Secondary | ICD-10-CM | POA: Diagnosis not present

## 2019-01-16 DIAGNOSIS — J329 Chronic sinusitis, unspecified: Secondary | ICD-10-CM | POA: Diagnosis not present

## 2019-01-16 DIAGNOSIS — J310 Chronic rhinitis: Secondary | ICD-10-CM | POA: Diagnosis not present

## 2019-01-23 ENCOUNTER — Ambulatory Visit: Payer: Medicaid Other | Attending: Internal Medicine

## 2019-01-23 DIAGNOSIS — Z20822 Contact with and (suspected) exposure to covid-19: Secondary | ICD-10-CM

## 2019-01-23 DIAGNOSIS — Z20828 Contact with and (suspected) exposure to other viral communicable diseases: Secondary | ICD-10-CM | POA: Diagnosis not present

## 2019-01-24 DIAGNOSIS — F411 Generalized anxiety disorder: Secondary | ICD-10-CM | POA: Diagnosis not present

## 2019-01-25 ENCOUNTER — Telehealth: Payer: Self-pay

## 2019-01-25 LAB — NOVEL CORONAVIRUS, NAA: SARS-CoV-2, NAA: NOT DETECTED

## 2019-01-25 NOTE — Telephone Encounter (Signed)
Pt notified of negative COVID-19 results. Understanding verbalized.  Ryan [name]   

## 2019-02-14 DIAGNOSIS — F411 Generalized anxiety disorder: Secondary | ICD-10-CM | POA: Diagnosis not present

## 2019-02-20 DIAGNOSIS — F331 Major depressive disorder, recurrent, moderate: Secondary | ICD-10-CM | POA: Diagnosis not present

## 2019-02-20 DIAGNOSIS — F411 Generalized anxiety disorder: Secondary | ICD-10-CM | POA: Diagnosis not present

## 2019-02-28 HISTORY — PX: SINUSOTOMY: SHX291

## 2019-03-07 DIAGNOSIS — Z20822 Contact with and (suspected) exposure to covid-19: Secondary | ICD-10-CM | POA: Diagnosis not present

## 2019-03-07 DIAGNOSIS — Z01812 Encounter for preprocedural laboratory examination: Secondary | ICD-10-CM | POA: Diagnosis not present

## 2019-03-09 DIAGNOSIS — J329 Chronic sinusitis, unspecified: Secondary | ICD-10-CM | POA: Diagnosis not present

## 2019-03-09 DIAGNOSIS — J310 Chronic rhinitis: Secondary | ICD-10-CM | POA: Diagnosis not present

## 2019-03-09 DIAGNOSIS — J343 Hypertrophy of nasal turbinates: Secondary | ICD-10-CM | POA: Diagnosis not present

## 2019-03-14 DIAGNOSIS — J329 Chronic sinusitis, unspecified: Secondary | ICD-10-CM | POA: Diagnosis not present

## 2019-03-27 DIAGNOSIS — Z720 Tobacco use: Secondary | ICD-10-CM | POA: Diagnosis not present

## 2019-03-27 DIAGNOSIS — R399 Unspecified symptoms and signs involving the genitourinary system: Secondary | ICD-10-CM | POA: Diagnosis not present

## 2019-04-04 DIAGNOSIS — J329 Chronic sinusitis, unspecified: Secondary | ICD-10-CM | POA: Diagnosis not present

## 2019-04-18 ENCOUNTER — Encounter: Payer: Self-pay | Admitting: Student in an Organized Health Care Education/Training Program

## 2019-04-18 ENCOUNTER — Other Ambulatory Visit: Payer: Self-pay

## 2019-04-18 ENCOUNTER — Ambulatory Visit
Payer: Medicaid Other | Attending: Student in an Organized Health Care Education/Training Program | Admitting: Student in an Organized Health Care Education/Training Program

## 2019-04-18 VITALS — BP 119/82 | HR 95 | Temp 98.2°F | Ht 67.0 in | Wt 175.0 lb

## 2019-04-18 DIAGNOSIS — M47816 Spondylosis without myelopathy or radiculopathy, lumbar region: Secondary | ICD-10-CM | POA: Diagnosis not present

## 2019-04-18 DIAGNOSIS — M48062 Spinal stenosis, lumbar region with neurogenic claudication: Secondary | ICD-10-CM

## 2019-04-18 DIAGNOSIS — G894 Chronic pain syndrome: Secondary | ICD-10-CM

## 2019-04-18 NOTE — Progress Notes (Signed)
PROVIDER NOTE: Information contained herein reflects review and annotations entered in association with encounter. Interpretation of such information and data should be left to medically-trained personnel. Information provided to patient can be located elsewhere in the medical record under "Patient Instructions". Document created using STT-dictation technology, any transcriptional errors that may result from process are unintentional.    Patient: Ryan [name]  Service Category: E/M  Provider: [name], MD  DOB: Jul 18, 1955  DOS: 04/18/2019  Referring Provider: Center, Ryan [name]*  MRN: [id number]  Setting: Ambulatory outpatient  PCP: Center, Ryan [name]  Type: Established Patient  Specialty: Interventional Pain Management    Location: Office  Delivery: Face-to-face     Primary Reason(s) for Visit: Evaluation of chronic illnesses with exacerbation, or progression (Level of risk: moderate) CC: Back Pain  HPI  Ryan [name] is a 64 y.o. year old, male patient, who comes today for a follow-up evaluation. He has Airway hyperreactivity; Chronic LBP; ED (erectile dysfunction) of organic origin; BP (high blood pressure); Lower urinary tract symptoms; Temporary cerebral vascular dysfunction; Compulsive tobacco user syndrome; DDD (degenerative disc disease), lumbosacral; Spinal stenosis, lumbar region, with neurogenic claudication; Lumbar radiculopathy; Lumbar herniated disc; Lumbar spondylosis; Failed back surgical syndrome; and History of lumbar fusion on their problem list. Ryan [name] last seen on Visit date not found. His primarily concern today is the Back Pain  Pain Assessment: Location: Lower Back Radiating: Pain radiaties down both legs to calf Onset: More than a month ago Duration: Chronic pain Quality: Burning, Aching, Sharp, Constant Severity: 5 /10 (subjective, self-reported pain score)  Note: Reported level is compatible with observation.                         When  using our objective Pain Scale, levels between 6 and 10/10 are said to belong in an emergency room, as it progressively worsens from a 6/10, described as severely limiting, requiring emergency care not usually available at an outpatient pain management facility. At a 6/10 level, communication becomes difficult and requires great effort. Assistance to reach the emergency department may be required. Facial flushing and profuse sweating along with potentially dangerous increases in heart rate and blood pressure will be evident. Effect on ADL: limits my daily activities Timing: Constant Modifying factors: injections BP: 119/82  HR: 95   Patient is a pleasant 64 year old male with history of L5-S1 lumbar fusion with lumbar facet arthropathy lumbar spondylosis who follows up for worsening low back and buttock pain.  Patient is status post lumbar facet medial branch nerve block #1 on 03/20/2018, over 1 year ago.  This provided him with significant pain (85%) relief for approximately 1 year and now he has had a gradual return of pain back to his baseline.  He is interested in repeating lumbar facet medial branch nerve block #2.  Risk and benefits of this procedure were reviewed and patient like to proceed.   Laboratory Chemistry Profile   Renal Lab Results  Component Value Date   BUN 22 10/19/2017   CREATININE 0.95 10/19/2017   GFRAA >60 10/19/2017   GFRNONAA >60 10/19/2017   PROTEINUR NEGATIVE 10/19/2017    Electrolytes Lab Results  Component Value Date   NA 140 10/19/2017   K 4.9 10/19/2017   CL 107 10/19/2017   CALCIUM 9.5 10/19/2017    Hepatic Lab Results  Component Value Date   AST 18 10/19/2017   ALT 17 10/19/2017   ALBUMIN 4.2 10/19/2017  ALKPHOS 104 10/19/2017   LIPASE 77 (H) 10/19/2017    ID Lab Results  Component Value Date   SARSCOV2NAA Not Detected 01/23/2019   STAPHAUREUS NEGATIVE 06/07/2014   MRSAPCR NEGATIVE 06/07/2014    Bone No results found for: VD25OH,  PY195KD3OIZ, TI4580DX8, PJ8250NL9, 25OHVITD1, 25OHVITD2, 25OHVITD3, TESTOFREE, TESTOSTERONE  Endocrine Lab Results  Component Value Date   GLUCOSE 103 (H) 10/19/2017   GLUCOSEU NEGATIVE 10/19/2017    Neuropathy No results found for: VITAMINB12, FOLATE, HGBA1C, HIV  CNS No results found for: COLORCSF, APPEARCSF, RBCCOUNTCSF, WBCCSF, POLYSCSF, LYMPHSCSF, EOSCSF, PROTEINCSF, GLUCCSF, JCVIRUS, CSFOLI, IGGCSF, LABACHR, ACETBL, LABACHR, ACETBL  Inflammation (CRP: Acute  ESR: Chronic) No results found for: CRP, ESRSEDRATE, LATICACIDVEN  Rheumatology No results found for: RF, ANA, LABURIC, URICUR, LYMEIGGIGMAB, LYMEABIGMQN, HLAB27  Coagulation Lab Results  Component Value Date   PLT 231 10/19/2017    Cardiovascular Lab Results  Component Value Date   TROPONINI <0.03 09/08/2017   HGB 16.0 10/19/2017   HCT 46.3 10/19/2017    Screening Lab Results  Component Value Date   SARSCOV2NAA Not Detected 01/23/2019   COVIDSOURCE NASOPHARYNGEAL 07/13/2018   STAPHAUREUS NEGATIVE 06/07/2014   MRSAPCR NEGATIVE 06/07/2014    Cancer No results found for: CEA, CA125, LABCA2  Allergens No results found for: ALMOND, APPLE, ASPARAGUS, AVOCADO, BANANA, BARLEY, BASIL, BAYLEAF, GREENBEAN, LIMABEAN, WHITEBEAN, BEEFIGE, REDBEET, BLUEBERRY, BROCCOLI, CABBAGE, MELON, CARROT, CASEIN, CASHEWNUT, CAULIFLOWER, CELERY    Note: Lab results reviewed.   Imaging Review    Lumbosacral Imaging: Lumbar MR wo contrast:  Results for orders placed during the hospital encounter of 01/10/18  MR LUMBAR SPINE WO CONTRAST   Narrative CLINICAL DATA:  Previous L5-S1 diskectomy and fusion. Low back pain radiating to the right leg with numbness and tingling over the last 2-3 months.  EXAM: MRI LUMBAR SPINE WITHOUT CONTRAST  TECHNIQUE: Multiplanar, multisequence MR imaging of the lumbar spine [name] performed. No intravenous contrast [name] administered.  COMPARISON:  Radiography 11/22/2017. CT 10/19/2017. MRI  04/23/2013.  FINDINGS: Segmentation:  5 lumbar type vertebral bodies.  Alignment:  4 mm anterolisthesis L3-4.  4 mm retrolisthesis L4-5.  Vertebrae: Chronic discogenic marrow changes in the lower lumbar spine. No acute bone finding.  Conus medullaris and cauda equina: Conus extends to the L1 level. Conus and cauda equina appear normal.  Paraspinal and other soft tissues: Negative  Disc levels:  No significant finding at T12-L1 or L1-2.  L2-3: Worsening of disease at this level with a circumferential protrusion of the disc more prominent towards the right. This indents the thecal sac. There is mild facet and ligamentous hypertrophy. Lateral recess narrowing and foraminal narrowing right more than left. Definite neural compression is not established, but findings at this level could be symptomatic.  L3-4: Bilateral facet arthropathy with 4 mm of anterolisthesis. Bulging of the disc. Stenosis of both lateral recesses, which could cause neural compression. Slight worsening since 2015.  L4-5: 4 mm of retrolisthesis. Chronic disc degeneration with loss of disc height. Endplate osteophytes and minor disc bulging. Mild lateral recess narrowing without visible neural compression. Mild bilateral foraminal narrowing without visible compression of the exiting L4 nerves.  L5-S1: Interval posterior decompression, diskectomy and fusion procedure. No complicating feature. Wide patency of the canal. Chronic foraminal narrowing on the right that would have some potential to affect the exiting L5 nerve.  IMPRESSION: Newly seen circumferential disc protrusion at L2-3 more prominent towards the right. Mild facet and ligamentous hypertrophy. Narrowing of the lateral recesses and foramina right more than left.  Definite neural compression is not established, but there is some potential that right-sided nerve compression or irritation could occur at this level.  Slight worsening of bilateral  lateral recess stenosis at L3-4 that could possibly be symptomatic on either side.  Similar appearance of bilateral lateral recess and foraminal narrowing at L4-5 but without likely neural compression.  Previous decompression and fusion at L5-S1. Good patency of the central canal. Some foraminal narrowing on the right which could affect the right L5 nerve.   Electronically Signed   By: Nelson Chimes M.D.   On: 01/10/2018 11:05     Results for orders placed during the hospital encounter of 04/19/14  CT Lumbar Spine W Contrast   Narrative CLINICAL DATA:  64 year old male with severe back pain. Displacement of lumbar intervertebral disc.  EXAM: LUMBAR MYELOGRAM  FLUOROSCOPY TIME:  1 minutes and 30 seconds  PROCEDURE: After thorough discussion of risks and benefits of the procedure including bleeding, infection, injury to nerves, blood vessels, adjacent structures as well as headache and CSF leak, written and oral informed consent [name] obtained. Consent [name] obtained by Dr. Genevie Ann. Time out form [name] completed.  Patient [name] positioned prone on the fluoroscopy table. Local anesthesia [name] provided with 1% lidocaine without epinephrine after prepped and draped in the usual sterile fashion. Puncture [name] performed at L2-L3 using a 3 1/2 inch 22-gauge spinal needle via right sub laminar approach. Using a single pass through the dura, the needle [name] placed within the thecal sac, with return of clear CSF. 15 mL of Omnipaque-180 [name] injected into the thecal sac, with normal opacification of the nerve roots and cauda equina consistent with free flow within the subarachnoid space.  I personally performed the lumbar puncture and administered the intrathecal contrast. I also personally supervised acquisition of the myelogram images.  As the lumbar puncture [name] just getting underway, the patient experienced acute and persistent right calf muscle cramps - which he reports often occurs  while prone, and that he consequently always avoids laying on his stomach. When the symptoms did not relent despite repositioning and massage, he [name] treated with a single intramuscular injection of 75 mg Demerol, and improved.  TECHNIQUE: Contiguous axial images were obtained through the Lumbar spine after the intrathecal infusion of infusion. Coronal and sagittal reconstructions were obtained of the axial image sets.  COMPARISON:  [facility] lumbar MRI 04/23/2013, and earlier.  FINDINGS: LUMBAR MYELOGRAM FINDINGS:  There is normal lumbar segmentation, corresponding to the numbering system in 2015.  Effacement of contrast from the thecal sac at L3-L4. In the lateral and upright positions there is ventral mass effect on the thecal sac at L3-L4, which progresses to severe when upright. There is mild grade 1 anterolisthesis at that level which does mildly increase with flexion and decrease with extension.  There is also severe disc space loss at L4-L5 with endplate spurring, sclerosis, and evidence of vacuum disc. There is a mild to moderate defect on the ventral thecal sac at that level. No abnormal me motion at that level with flexion extension. No other abnormal motion identified.  CT LUMBAR MYELOGRAM FINDINGS:  Stable vertebral height and alignment since 2015. Visible sacrum and SI joints intact. [place] round lucent areas in the medial right iliac bone are stable since 07/15/2011 (series 4, image 83 and image 87 on the right).  Negative visualized costophrenic angles. Punctate left nephrolithiasis. Chronic Aortoiliac calcified atherosclerosis noted.  Normal myelographic appearance of the lower thoracic spinal cord, conus  at L1.  T11-T12:  Partially visible, grossly negative.  T12-L1:  Negative.  L1-L2:  Negative.  L2-L3: Stable trace retrolisthesis. Stable mild disc bulge. Stable mild facet hypertrophy. No significant stenosis.  L3-L4: Mild  disc space loss and circumferential disc bulge. Moderate to severe facet and moderate ligament flavum hypertrophy. Mild spinal stenosis and left lateral recess stenosis. Mild left greater than right L3 foraminal stenosis. This level appears stable since 2015.  L4-L5: Chronic severe disc space loss with vacuum disc and bulky circumferential disc osteophyte complex. Mild to moderate facet hypertrophy. Borderline to mild lateral recess stenosis primarily on the left. No significant spinal stenosis. Moderate left and mild right L4 foraminal stenosis.  L5-S1: Disc space loss and bulky right eccentric circumferential disc osteophyte complex. Increased density in the proximal right L5 neural foramen, suspicious for disc herniation and possible sequestered disc fragment. Just below the disc space level there is moderate right lateral recess stenosis. No significant spinal stenosis. Mild to moderate left L5 foraminal stenosis is stable.  IMPRESSION: LUMBAR MYELOGRAM IMPRESSION:  1. Abnormal motion at L3-L4 where there is grade 1 anterolisthesis in the neutral position. Upright positioning also a exacerbates spinal stenosis at that level, which appears severe on upright myelographic images. 2. No other abnormal lumbar motion identified.  CT LUMBAR MYELOGRAM IMPRESSION:  1. Severe chronic disc and endplate degeneration at L4-L5 with mild left lateral recess and moderate left L4 foraminal stenosis. 2. Severe right L5-S1 neural foraminal stenosis suspected due to chronic disc herniation and possible sequestered fragment. Query right L5 radiculitis. Just below the disc space there is also moderate right lateral recess stenosis at the descending right S1 nerve root level. 3. Multifactorial spinal stenosis at L3-L4 appears mild on prone postmyelogram CT imaging. There is also mild left lateral recess and left greater than right L3 foraminal stenosis. 4. Punctate left nephrolithiasis. Chronic  aortoiliac calcified atherosclerosis.   Electronically Signed   By: Genevie Ann M.D.   On: 04/19/2014 15:54    Results for orders placed during the hospital encounter of 11/22/17  DG Lumbar Spine 2-3 Views   Narrative CLINICAL DATA:  Low back pain with radiculopathy.  EXAM: LUMBAR SPINE - 2-3 VIEW  COMPARISON:  CT abdomen pelvis 10/19/2017  FINDINGS: Mild anterolisthesis L4-5. Remaining alignment normal. Negative for fracture  Advanced disc degeneration L4-5 with disc space narrowing, endplate sclerosis and spurring similar to the prior study. Pedicle screw and interbody fusion L5-S1. Question pseudarthrosis.  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  Mild anterolisthesis L3-4  Pedicle screw interbody fusion L5-S1.  Question pseudarthrosis.   Electronically Signed   By: Franchot Gallo M.D.   On: 11/22/2017 11:01    Results for orders placed during the hospital encounter of 04/19/14  DG MYELOGRAPHY LUMBAR INJ LUMBOSACRAL   Narrative CLINICAL DATA:  64 year old male with severe back pain. Displacement of lumbar intervertebral disc.  EXAM: LUMBAR MYELOGRAM  FLUOROSCOPY TIME:  1 minutes and 30 seconds  PROCEDURE: After thorough discussion of risks and benefits of the procedure including bleeding, infection, injury to nerves, blood vessels, adjacent structures as well as headache and CSF leak, written and oral informed consent [name] obtained. Consent [name] obtained by Dr. Genevie Ann. Time out form [name] completed.  Patient [name] positioned prone on the fluoroscopy table. Local anesthesia [name] provided with 1% lidocaine without epinephrine after prepped and draped in the usual sterile fashion. Puncture [name] performed at L2-L3 using a 3 1/2 inch 22-gauge spinal needle via right sub laminar approach. Using  a single pass through the dura, the needle [name] placed within the thecal sac, with return of clear CSF. 15 mL of Omnipaque-180 [name] injected into the thecal sac, with normal  opacification of the nerve roots and cauda equina consistent with free flow within the subarachnoid space.  I personally performed the lumbar puncture and administered the intrathecal contrast. I also personally supervised acquisition of the myelogram images.  As the lumbar puncture [name] just getting underway, the patient experienced acute and persistent right calf muscle cramps - which he reports often occurs while prone, and that he consequently always avoids laying on his stomach. When the symptoms did not relent despite repositioning and massage, he [name] treated with a single intramuscular injection of 75 mg Demerol, and improved.  TECHNIQUE: Contiguous axial images were obtained through the Lumbar spine after the intrathecal infusion of infusion. Coronal and sagittal reconstructions were obtained of the axial image sets.  COMPARISON:  [facility] lumbar MRI 04/23/2013, and earlier.  FINDINGS: LUMBAR MYELOGRAM FINDINGS:  There is normal lumbar segmentation, corresponding to the numbering system in 2015.  Effacement of contrast from the thecal sac at L3-L4. In the lateral and upright positions there is ventral mass effect on the thecal sac at L3-L4, which progresses to severe when upright. There is mild grade 1 anterolisthesis at that level which does mildly increase with flexion and decrease with extension.  There is also severe disc space loss at L4-L5 with endplate spurring, sclerosis, and evidence of vacuum disc. There is a mild to moderate defect on the ventral thecal sac at that level. No abnormal me motion at that level with flexion extension. No other abnormal motion identified.  CT LUMBAR MYELOGRAM FINDINGS:  Stable vertebral height and alignment since 2015. Visible sacrum and SI joints intact. [place] round lucent areas in the medial right iliac bone are stable since 07/15/2011 (series 4, image 83 and image 87 on the right).  Negative  visualized costophrenic angles. Punctate left nephrolithiasis. Chronic Aortoiliac calcified atherosclerosis noted.  Normal myelographic appearance of the lower thoracic spinal cord, conus at L1.  T11-T12:  Partially visible, grossly negative.  T12-L1:  Negative.  L1-L2:  Negative.  L2-L3: Stable trace retrolisthesis. Stable mild disc bulge. Stable mild facet hypertrophy. No significant stenosis.  L3-L4: Mild disc space loss and circumferential disc bulge. Moderate to severe facet and moderate ligament flavum hypertrophy. Mild spinal stenosis and left lateral recess stenosis. Mild left greater than right L3 foraminal stenosis. This level appears stable since 2015.  L4-L5: Chronic severe disc space loss with vacuum disc and bulky circumferential disc osteophyte complex. Mild to moderate facet hypertrophy. Borderline to mild lateral recess stenosis primarily on the left. No significant spinal stenosis. Moderate left and mild right L4 foraminal stenosis.  L5-S1: Disc space loss and bulky right eccentric circumferential disc osteophyte complex. Increased density in the proximal right L5 neural foramen, suspicious for disc herniation and possible sequestered disc fragment. Just below the disc space level there is moderate right lateral recess stenosis. No significant spinal stenosis. Mild to moderate left L5 foraminal stenosis is stable.  IMPRESSION: LUMBAR MYELOGRAM IMPRESSION:  1. Abnormal motion at L3-L4 where there is grade 1 anterolisthesis in the neutral position. Upright positioning also a exacerbates spinal stenosis at that level, which appears severe on upright myelographic images. 2. No other abnormal lumbar motion identified.  CT LUMBAR MYELOGRAM IMPRESSION:  1. Severe chronic disc and endplate degeneration at L4-L5 with mild left lateral recess and moderate left L4  foraminal stenosis. 2. Severe right L5-S1 neural foraminal stenosis suspected due to chronic disc  herniation and possible sequestered fragment. Query right L5 radiculitis. Just below the disc space there is also moderate right lateral recess stenosis at the descending right S1 nerve root level. 3. Multifactorial spinal stenosis at L3-L4 appears mild on prone postmyelogram CT imaging. There is also mild left lateral recess and left greater than right L3 foraminal stenosis. 4. Punctate left nephrolithiasis. Chronic aortoiliac calcified atherosclerosis.   Electronically Signed   By: Genevie Ann M.D.   On: 04/19/2014 15:54    Complexity Note: Imaging results reviewed. Results shared with Mr. Amory, using Layman's terms.                         Meds   Current Outpatient Medications:  .  acetaminophen (TYLENOL) 325 MG tablet, Take 650 mg by mouth every 6 (six) hours as needed for mild pain or headache. , Disp: , Rfl:  .  albuterol (PROVENTIL HFA;VENTOLIN HFA) 108 (90 BASE) MCG/ACT inhaler, Inhale 1-2 puffs into the lungs every 6 (six) hours as needed for wheezing or shortness of breath. , Disp: , Rfl:  .  ALPRAZolam (XANAX) 1 MG tablet, Take 1 mg by mouth at bedtime as needed for anxiety., Disp: , Rfl:  .  cetirizine (ZYRTEC) 10 MG tablet, Take 10 mg by mouth daily., Disp: , Rfl:  .  cyclobenzaprine (FLEXERIL) 10 MG tablet, Take 10 mg by mouth as needed. , Disp: , Rfl:  .  fluticasone (FLONASE) 50 MCG/ACT nasal spray, Place into both nostrils daily., Disp: , Rfl:  .  gabapentin (NEURONTIN) 600 MG tablet, 600 mg qAM, 600 mg qPM, 1200 mg qhs, Disp: 90 tablet, Rfl: 2 .  lisinopril (PRINIVIL,ZESTRIL) 20 MG tablet, Take 10 mg by mouth daily., Disp: , Rfl:  .  Multiple Vitamin (MULTIVITAMIN) tablet, Take 1 tablet by mouth daily., Disp: , Rfl:  .  tamsulosin (FLOMAX) 0.4 MG CAPS capsule, Take 0.8 mg by mouth daily. , Disp: , Rfl:  .  omeprazole (PRILOSEC) 40 MG capsule, Take 40 mg by mouth daily., Disp: , Rfl:  .  sildenafil (REVATIO) 20 MG tablet, Take 20 mg by mouth at bedtime as needed., Disp:  , Rfl:   ROS  Constitutional: Denies any fever or chills Gastrointestinal: No reported hemesis, hematochezia, vomiting, or acute GI distress Musculoskeletal: Denies any acute onset joint swelling, redness, loss of ROM, or weakness Neurological: No reported episodes of acute onset apraxia, aphasia, dysarthria, agnosia, amnesia, paralysis, loss of coordination, or loss of consciousness  Allergies  Mr. Scheaffer has No Known Allergies.  PFSH  Drug: Mr. Perkin  reports no history of drug use. Alcohol:  reports no history of alcohol use. Tobacco:  reports that he has been smoking cigarettes. He has a 60.00 pack-year smoking history. He has never used smokeless tobacco. Medical:  has a past medical history of Arthritis, Asthma, Chronic kidney disease, COPD (chronic obstructive pulmonary disease) ([place]), Dyspnea, GERD (gastroesophageal reflux disease), Hyperlipidemia, Hypertension, Lower urinary tract symptoms (LUTS), Wears dentures, and Wears hearing aid. Surgical: Mr. Grauberger  has a past surgical history that includes Eye surgery; Hernia repair; Transforaminal lumbar interbody fusion (tlif) with pedicle screw fixation 1 level (Right, 06/18/2014); Myringotomy with tube placement (Bilateral, 03/28/2015); Back surgery; Esophagogastroduodenoscopy (egd) with propofol (N/A, 10/24/2015); Cataract extraction w/PHACO (Left, 01/06/2016); Tonsillectomy; Colonoscopy with propofol (N/A, 04/19/2016); and Sinusotomy (02/28/2019). Family: family history includes Arthritis in his mother; Diabetes in his  father; Hypertension in his father and mother.  Constitutional Exam  General appearance: Well nourished, well developed, and well hydrated. In no apparent acute distress Vitals:   04/18/19 1406  BP: 119/82  Pulse: 95  Temp: 98.2 F (36.8 C)  SpO2: 95%  Weight: 175 lb (79.4 kg)  Height: '5\' 7"'  (1.702 m)   BMI Assessment: Estimated body mass index is 27.41 kg/m as calculated from the following:   Height as of this  encounter: '5\' 7"'  (1.702 m).   Weight as of this encounter: 175 lb (79.4 kg).  BMI interpretation table: BMI level Category Range association with higher incidence of chronic pain  <18 kg/m2 Underweight   18.5-24.9 kg/m2 Ideal body weight   25-29.9 kg/m2 Overweight Increased incidence by 20%  30-34.9 kg/m2 Obese (Class I) Increased incidence by 68%  35-39.9 kg/m2 Severe obesity (Class II) Increased incidence by 136%  >40 kg/m2 Extreme obesity (Class III) Increased incidence by 254%   Patient's current BMI Ideal Body weight  Body mass index is 27.41 kg/m. Ideal body weight: 66.1 kg (145 lb 11.6 oz) Adjusted ideal body weight: 71.4 kg (157 lb 6.9 oz)   BMI Readings from Last 4 Encounters:  04/18/19 27.41 kg/m  03/20/18 28.19 kg/m  03/07/18 28.98 kg/m  02/08/18 28.98 kg/m   Wt Readings from Last 4 Encounters:  04/18/19 175 lb (79.4 kg)  03/20/18 180 lb (81.6 kg)  03/07/18 185 lb (83.9 kg)  02/08/18 185 lb (83.9 kg)    Psych/Mental status: Alert, oriented x 3 (person, place, & time)       Eyes: PERLA Respiratory: No evidence of acute respiratory distress  Cervical Spine Exam  Skin & Axial Inspection: No masses, redness, edema, swelling, or associated skin lesions Alignment: Symmetrical Functional ROM: Unrestricted ROM      Stability: No instability detected Muscle Tone/Strength: Functionally intact. No obvious neuro-muscular anomalies detected. Sensory (Neurological): Unimpaired Palpation: No palpable anomalies              Upper Extremity (UE) Exam    Side: Right upper extremity  Side: Left upper extremity  Skin & Extremity Inspection: Skin color, temperature, and hair growth are WNL. No peripheral edema or cyanosis. No masses, redness, swelling, asymmetry, or associated skin lesions. No contractures.  Skin & Extremity Inspection: Skin color, temperature, and hair growth are WNL. No peripheral edema or cyanosis. No masses, redness, swelling, asymmetry, or associated skin  lesions. No contractures.  Functional ROM: Unrestricted ROM          Functional ROM: Unrestricted ROM          Muscle Tone/Strength: Functionally intact. No obvious neuro-muscular anomalies detected.  Muscle Tone/Strength: Functionally intact. No obvious neuro-muscular anomalies detected.  Sensory (Neurological): Unimpaired          Sensory (Neurological): Unimpaired          Palpation: No palpable anomalies              Palpation: No palpable anomalies              Provocative Test(s):  Phalen's test: deferred Tinel's test: deferred Apley's scratch test (touch opposite shoulder):  Action 1 (Across chest): deferred Action 2 (Overhead): deferred Action 3 (LB reach): deferred   Provocative Test(s):  Phalen's test: deferred Tinel's test: deferred Apley's scratch test (touch opposite shoulder):  Action 1 (Across chest): deferred Action 2 (Overhead): deferred Action 3 (LB reach): deferred    Thoracic Spine Area Exam  Skin & Axial Inspection: No masses, redness,  or swelling Alignment: Symmetrical Functional ROM: Unrestricted ROM Stability: No instability detected Muscle Tone/Strength: Functionally intact. No obvious neuro-muscular anomalies detected. Sensory (Neurological): Unimpaired Muscle strength & Tone: No palpable anomalies  Lumbar Exam  Skin & Axial Inspection: Well healed scar from previous spine surgery detected Alignment: Symmetrical Functional ROM: Pain restricted ROM       Stability: No instability detected Muscle Tone/Strength: Functionally intact. No obvious neuro-muscular anomalies detected. Sensory (Neurological): Musculoskeletal pain pattern Palpation: No palpable anomalies       Provocative Tests: Hyperextension/rotation test: (+) bilaterally for facet joint pain. Lumbar quadrant test (Kemp's test): (+) bilaterally for facet joint pain. Lateral bending test: (+) due to pain. Patrick's Maneuver: deferred today                   FABER* test: deferred today                    S-I anterior distraction/compression test: deferred today         S-I lateral compression test: deferred today         S-I Thigh-thrust test: deferred today         S-I Gaenslen's test: deferred today         *(Flexion, ABduction and External Rotation)  Gait & Posture Assessment  Ambulation: Unassisted Gait: Relatively normal for age and body habitus Posture: WNL   Lower Extremity Exam    Side: Right lower extremity  Side: Left lower extremity  Stability: No instability observed          Stability: No instability observed          Skin & Extremity Inspection: Skin color, temperature, and hair growth are WNL. No peripheral edema or cyanosis. No masses, redness, swelling, asymmetry, or associated skin lesions. No contractures.  Skin & Extremity Inspection: Skin color, temperature, and hair growth are WNL. No peripheral edema or cyanosis. No masses, redness, swelling, asymmetry, or associated skin lesions. No contractures.  Functional ROM: Unrestricted ROM                  Functional ROM: Unrestricted ROM                  Muscle Tone/Strength: Functionally intact. No obvious neuro-muscular anomalies detected.  Muscle Tone/Strength: Functionally intact. No obvious neuro-muscular anomalies detected.  Sensory (Neurological): Unimpaired        Sensory (Neurological): Unimpaired        DTR: Patellar: deferred today Achilles: deferred today Plantar: deferred today  DTR: Patellar: deferred today Achilles: deferred today Plantar: deferred today  Palpation: No palpable anomalies  Palpation: No palpable anomalies   Assessment   Status Diagnosis  Having a Flare-up Having a Flare-up Controlled 1. Lumbar spondylosis   2. Lumbar facet arthropathy   3. Spinal stenosis, lumbar region, with neurogenic claudication   4. Chronic pain syndrome        Plan of Care  Lumbar facet medial branch nerve blocks #2 as below Orders:  Orders Placed This Encounter  Procedures  . LUMBAR  FACET(MEDIAL BRANCH NERVE BLOCK) MBNB    Standing Status:   Future    Standing Expiration Date:   05/19/2019    Scheduling Instructions:     Procedure: Lumbar facet block (AKA.: Lumbosacral medial branch nerve block)     Side: Bilateral     Level: L3-4, L4-5, & L5-S1 Facets (L3, L4, L5, & S1 Medial Branch Nerves)     Sedation: with  Timeframe: ASAA    Order Specific Question:   Where will this procedure be performed?    Answer:   [facility]   Planned follow-up:   Return in about 1 week (around 04/25/2019) for B/L L3-S1 Fcts #2 , with sedation.     Status post bilateral L3, L4, L5, S1 lumbar facet medial branch nerve blocks on 03/20/2018 helped 85% pain relief for approximately 1 year   Recent Visits No visits were found meeting these conditions.  Showing recent visits within past 90 days and meeting all other requirements   Today's Visits Date Type Provider Dept  04/18/19 Office Visit Ryan Santa, MD [facility]  Showing today's visits and meeting all other requirements   Future Appointments Date Type Provider Dept  04/25/19 Appointment Ryan Santa, MD [facility]  Showing future appointments within next 90 days and meeting all other requirements   Primary Care Physician: [facility] [place] Location: [facility] Note by: Ryan Santa, MD Date: 04/18/2019; Time: 3:34 PM  Note: This dictation [name] prepared with Dragon dictation. Any transcriptional errors that may result from this process are unintentional.

## 2019-04-18 NOTE — Progress Notes (Signed)
Safety precautions to be maintained throughout the outpatient stay will include: orient to surroundings, keep bed in low position, maintain call bell within reach at all times, provide assistance with transfer out of bed and ambulation.  

## 2019-04-25 ENCOUNTER — Ambulatory Visit (HOSPITAL_BASED_OUTPATIENT_CLINIC_OR_DEPARTMENT_OTHER): Payer: Medicaid Other | Admitting: Student in an Organized Health Care Education/Training Program

## 2019-04-25 ENCOUNTER — Encounter: Payer: Self-pay | Admitting: Student in an Organized Health Care Education/Training Program

## 2019-04-25 ENCOUNTER — Other Ambulatory Visit: Payer: Self-pay

## 2019-04-25 ENCOUNTER — Ambulatory Visit
Admission: RE | Admit: 2019-04-25 | Discharge: 2019-04-25 | Disposition: A | Discharge status: Home / Self Care | Payer: Medicaid Other | Source: Ambulatory Visit | Attending: Student in an Organized Health Care Education/Training Program | Admitting: Student in an Organized Health Care Education/Training Program

## 2019-04-25 VITALS — BP 137/87 | HR 86 | Temp 97.7°F | Resp 17 | Ht 67.0 in | Wt 175.0 lb

## 2019-04-25 DIAGNOSIS — M47816 Spondylosis without myelopathy or radiculopathy, lumbar region: Secondary | ICD-10-CM | POA: Diagnosis not present

## 2019-04-25 MED ORDER — FENTANYL CITRATE (PF) 100 MCG/2ML IJ SOLN
INTRAMUSCULAR | Status: AC
  Filled 2019-04-25: qty 2

## 2019-04-25 MED ORDER — ROPIVACAINE HCL 2 MG/ML IJ SOLN
INTRAMUSCULAR | Status: AC
  Filled 2019-04-25: qty 20

## 2019-04-25 MED ORDER — MIDAZOLAM HCL 5 MG/5ML IJ SOLN
INTRAMUSCULAR | Status: AC
  Filled 2019-04-25: qty 5

## 2019-04-25 MED ORDER — MIDAZOLAM HCL 5 MG/5ML IJ SOLN
1.0000 mg | INTRAMUSCULAR | Status: DC | PRN
  Administered 2019-04-25 (×2): 1 mg via INTRAVENOUS

## 2019-04-25 MED ORDER — DEXAMETHASONE SODIUM PHOSPHATE 10 MG/ML IJ SOLN
INTRAMUSCULAR | Status: AC
  Filled 2019-04-25: qty 2

## 2019-04-25 MED ORDER — ROPIVACAINE HCL 2 MG/ML IJ SOLN
9.0000 mL | Freq: Once | INTRAMUSCULAR | Status: AC
  Administered 2019-04-25: 9 mL via PERINEURAL

## 2019-04-25 MED ORDER — DEXAMETHASONE SODIUM PHOSPHATE 10 MG/ML IJ SOLN
10.0000 mg | Freq: Once | INTRAMUSCULAR | Status: AC
  Administered 2019-04-25: 10 mg

## 2019-04-25 MED ORDER — FENTANYL CITRATE (PF) 100 MCG/2ML IJ SOLN
25.0000 ug | INTRAMUSCULAR | Status: AC | PRN
  Administered 2019-04-25: 25 ug via INTRAVENOUS
  Administered 2019-04-25: 50 ug via INTRAVENOUS
  Administered 2019-04-25: 25 ug via INTRAVENOUS

## 2019-04-25 MED ORDER — LIDOCAINE HCL 2 % IJ SOLN
20.0000 mL | Freq: Once | INTRAMUSCULAR | Status: AC
  Administered 2019-04-25: 400 mg

## 2019-04-25 MED ORDER — LIDOCAINE HCL 2 % IJ SOLN
INTRAMUSCULAR | Status: AC
  Filled 2019-04-25: qty 20

## 2019-04-25 NOTE — Patient Instructions (Signed)
____________________________________________________________________________________________  Post-Procedure Discharge Instructions  Instructions:  Apply ice:   Purpose: This will minimize any swelling and discomfort after procedure.   When: Day of procedure, as soon as you get home.  How: Fill a plastic sandwich bag with crushed ice. Cover it with a Ropp towel and apply to injection site.  How long: (15 min on, 15 min off) Apply for 15 minutes then remove x 15 minutes.  Repeat sequence on day of procedure, until you go to bed.  Apply heat:   Purpose: To treat any soreness and discomfort from the procedure.  When: Starting the next day after the procedure.  How: Apply heat to procedure site starting the day following the procedure.  How long: May continue to repeat daily, until discomfort goes away.  Food intake: Start with clear liquids (like water) and advance to regular food, as tolerated.   Physical activities: Keep activities to a minimum for the first 8 hours after the procedure. After that, then as tolerated.  Driving: If you have received any sedation, be responsible and do not drive. You are not allowed to drive for 24 hours after having sedation.  Blood thinner: (Applies only to those taking blood thinners) You may restart your blood thinner 6 hours after your procedure.  Insulin: (Applies only to Diabetic patients taking insulin) As soon as you can eat, you may resume your normal dosing schedule.  Infection prevention: Keep procedure site clean and dry. Shower daily and clean area with soap and water.  Post-procedure Pain Diary: Extremely important that this be done correctly and accurately. Recorded information will be used to determine the next step in treatment. For the purpose of accuracy, follow these rules:  Evaluate only the area treated. Do not report or include pain from an untreated area. For the purpose of this evaluation, ignore all other areas of pain,  except for the treated area.  After your procedure, avoid taking a long nap and attempting to complete the pain diary after you wake up. Instead, set your alarm clock to go off every hour, on the hour, for the initial 8 hours after the procedure. Document the duration of the numbing medicine, and the relief you are getting from it.  Do not go to sleep and attempt to complete it later. It will not be accurate. If you received sedation, it is likely that you were given a medication that may cause amnesia. Because of this, completing the diary at a later time may cause the information to be inaccurate. This information is needed to plan your care.  Follow-up appointment: Keep your post-procedure follow-up evaluation appointment after the procedure (usually 2 weeks for most procedures, 6 weeks for radiofrequencies). DO NOT FORGET to bring you pain diary with you.   Expect: (What should I expect to see with my procedure?)  From numbing medicine (AKA: Local Anesthetics): Numbness or decrease in pain. You may also experience some weakness, which if present, could last for the duration of the local anesthetic.  Onset: Full effect within 15 minutes of injected.  Duration: It will depend on the type of local anesthetic used. On the average, 1 to 8 hours.   From steroids (Applies only if steroids were used): Decrease in swelling or inflammation. Once inflammation is improved, relief of the pain will follow.  Onset of benefits: Depends on the amount of swelling present. The more swelling, the longer it will take for the benefits to be seen. In some cases, up to 10 days.    Duration: Steroids will stay in the system x 2 weeks. Duration of benefits will depend on multiple posibilities including persistent irritating factors.  Side-effects: If present, they may typically last 2 weeks (the duration of the steroids).  Frequent: Cramps (if they occur, drink Gatorade and take over-the-counter Magnesium 450-500 mg  once to twice a day); water retention with temporary weight gain; increases in blood sugar; decreased immune system response; increased appetite.  Occasional: Facial flushing (red, warm cheeks); mood swings; menstrual changes.  Uncommon: Long-term decrease or suppression of natural hormones; bone thinning. (These are more common with higher doses or more frequent use. This is why we prefer that our patients avoid having any injection therapies in other practices.)   Very Rare: Severe mood changes; psychosis; aseptic necrosis.  From procedure: Some discomfort is to be expected once the numbing medicine wears off. This should be minimal if ice and heat are applied as instructed.  Call if: (When should I call?)  You experience numbness and weakness that gets worse with time, as opposed to wearing off.  New onset bowel or bladder incontinence. (Applies only to procedures done in the spine)  Emergency Numbers:  Durning business hours (Monday - Thursday, 8:00 AM - 4:00 PM) (Friday, 9:00 AM - 12:00 Noon): [phone]  After hours: [phone]  NOTE: If you are having a problem and are unable connect with, or to talk to a provider, then go to your nearest urgent care or emergency department. If the problem is serious and urgent, please call 911. ____________________________________________________________________________________________    

## 2019-04-25 NOTE — Progress Notes (Signed)
Patient's Name: Ryan [name]  MRN: [id number]  Referring Provider: [facility]  DOB: 1956-01-13  PCP: [facility], [place]  DOS: 04/25/2019  Note by: [name], MD  Service setting: Ambulatory outpatient  Specialty: Interventional Pain Management  Patient type: Established  Location: [facility] (AMB) Pain Management Facility  Visit type: Interventional Procedure   Primary Reason for Visit: Interventional Pain Management Treatment. CC: Back Pain (low and bilteral)  Procedure:          Anesthesia, Analgesia, Anxiolysis:  Type: Lumbar Facet, Medial Branch Block(s) #2  (#1 done 03/20/2018) Primary Purpose: Diagnostic Region: Posterolateral Lumbosacral Spine Level: L3, L4, L5, & S1 Medial Branch Level(s). Injecting these levels blocks the L3-4, L4-5, and L5-S1 lumbar facet joints. Laterality: Bilateral  Type: Moderate (Conscious) Sedation combined with Local Anesthesia Indication(s): Analgesia and Anxiety Route: Intravenous (IV) IV Access: Secured Sedation: Meaningful verbal contact was maintained at all times during the procedure  Local Anesthetic: Lidocaine 1-2%  Position: Prone   Indications: 1. Lumbar spondylosis   2. Lumbar facet arthropathy    Pain Score: Pre-procedure: 6 /10 Post-procedure: 0-No pain/10  Pre-op Assessment:  Ryan [name] is a 64 y.o. (year old), male patient, seen today for interventional treatment. He  has a past surgical history that includes Eye surgery; Hernia repair; Transforaminal lumbar interbody fusion (tlif) with pedicle screw fixation 1 level (Right, 06/18/2014); Myringotomy with tube placement (Bilateral, 03/28/2015); Back surgery; Esophagogastroduodenoscopy (egd) with propofol (N/A, 10/24/2015); Cataract extraction w/PHACO (Left, 01/06/2016); Tonsillectomy; Colonoscopy with propofol (N/A, 04/19/2016); and Sinusotomy (02/28/2019). Ryan [name] has a current medication list which includes the following prescription(s): acetaminophen,  albuterol, alprazolam, cetirizine, cyclobenzaprine, fluticasone, gabapentin, lisinopril, multivitamin, omeprazole, sildenafil, and tamsulosin, and the following Facility-Administered Medications: midazolam. His primarily concern today is the Back Pain (low and bilteral)  Initial Vital Signs:  Pulse/HCG Rate: 95ECG Heart Rate: 85 Temp: 98.2 F (36.8 C) Resp: 16 BP: (!) 147/83 SpO2: 96 %  BMI: Estimated body mass index is 27.41 kg/m as calculated from the following:   Height as of this encounter: 5\' 7"  (1.702 m).   Weight as of this encounter: 175 lb (79.4 kg).  Risk Assessment: Allergies: Reviewed. He has No Known Allergies.  Allergy Precautions: None required Coagulopathies: Reviewed. None identified.  Blood-thinner therapy: None at this time Active Infection(s): Reviewed. None identified. Ryan [name] is afebrile  Site Confirmation: Ryan [name] was asked to confirm the procedure and laterality before marking the site Procedure checklist: Completed Consent: Before the procedure and under the influence of no sedative(s), amnesic(s), or anxiolytics, the patient was informed of the treatment options, risks and possible complications. To fulfill our ethical and legal obligations, as recommended by the American Medical Association's Code of Ethics, I have informed the patient of my clinical impression; the nature and purpose of the treatment or procedure; the risks, benefits, and possible complications of the intervention; the alternatives, including doing nothing; the risk(s) and benefit(s) of the alternative treatment(s) or procedure(s); and the risk(s) and benefit(s) of doing nothing. The patient was provided information about the general risks and possible complications associated with the procedure. These may include, but are not limited to: failure to achieve desired goals, infection, bleeding, organ or nerve damage, allergic reactions, paralysis, and death. In addition, the patient was  informed of those risks and complications associated to Spine-related procedures, such as failure to decrease pain; infection (i.e.: Meningitis, epidural or intraspinal abscess); bleeding (i.e.: epidural hematoma, subarachnoid hemorrhage, or any other type of intraspinal or  peri-dural bleeding); organ or nerve damage (i.e.: Any type of peripheral nerve, nerve root, or spinal cord injury) with subsequent damage to sensory, motor, and/or autonomic systems, resulting in permanent pain, numbness, and/or weakness of one or several areas of the body; allergic reactions; (i.e.: anaphylactic reaction); and/or death. Furthermore, the patient was informed of those risks and complications associated with the medications. These include, but are not limited to: allergic reactions (i.e.: anaphylactic or anaphylactoid reaction(s)); adrenal axis suppression; blood sugar elevation that in diabetics may result in ketoacidosis or comma; water retention that in patients with history of congestive heart failure may result in shortness of breath, pulmonary edema, and decompensation with resultant heart failure; weight gain; swelling or edema; medication-induced neural toxicity; particulate matter embolism and blood vessel occlusion with resultant organ, and/or nervous system infarction; and/or aseptic necrosis of one or more joints. Finally, the patient was informed that Medicine is not an exact science; therefore, there is also the possibility of unforeseen or unpredictable risks and/or possible complications that may result in a catastrophic outcome. The patient indicated having understood very clearly. We have given the patient no guarantees and we have made no promises. Enough time was given to the patient to ask questions, all of which were answered to the patient's satisfaction. Ryan [name] has indicated that he wanted to continue with the procedure. Attestation: I, the ordering provider, attest that I have discussed with the  patient the benefits, risks, side-effects, alternatives, likelihood of achieving goals, and potential problems during recovery for the procedure that I have provided informed consent. Date  Time: 04/25/2019 10:07 AM  Pre-Procedure Preparation:  Monitoring: As per clinic protocol. Respiration, ETCO2, SpO2, BP, heart rate and rhythm monitor placed and checked for adequate function Safety Precautions: Patient was assessed for positional comfort and pressure points before starting the procedure. Time-out: I initiated and conducted the "Time-out" before starting the procedure, as per protocol. The patient was asked to participate by confirming the accuracy of the "Time Out" information. Verification of the correct person, site, and procedure were performed and confirmed by me, the nursing staff, and the patient. "Time-out" conducted as per Joint Commission's Universal Protocol (UP.01.01.01). Time: 1053  Description of Procedure:          Laterality: Bilateral. The procedure was performed in identical fashion on both sides. Levels:  L3, L4, L5, & S1 Medial Branch Level(s) Area Prepped: Posterior Lumbosacral Region Prepping solution: ChloraPrep (2% chlorhexidine gluconate and 70% isopropyl alcohol) Safety Precautions: Aspiration looking for blood return was conducted prior to all injections. At no point did we inject any substances, as a needle was being advanced. Before injecting, the patient was told to immediately notify me if he was experiencing any new onset of "ringing in the ears, or metallic taste in the mouth". No attempts were made at seeking any paresthesias. Safe injection practices and needle disposal techniques used. Medications properly checked for expiration dates. SDV (single dose vial) medications used. After the completion of the procedure, all disposable equipment used was discarded in the proper designated medical waste containers. Local Anesthesia: Protocol guidelines were followed. The  patient was positioned over the fluoroscopy table. The area was prepped in the usual manner. The time-out was completed. The target area was identified using fluoroscopy. A 12-in long, straight, sterile hemostat was used with fluoroscopic guidance to locate the targets for each level blocked. Once located, the skin was marked with an approved surgical skin marker. Once all sites were marked, the skin (epidermis, dermis, and  hypodermis), as well as deeper tissues (fat, connective tissue and muscle) were infiltrated with a Schabel amount of a short-acting local anesthetic, loaded on a 10cc syringe with a 25G, 1.5-in  Needle. An appropriate amount of time was allowed for local anesthetics to take effect before proceeding to the next step. Local Anesthetic: Lidocaine 2.0% The unused portion of the local anesthetic was discarded in the proper designated containers. Technical explanation of process:   L3 Medial Branch Nerve Block (MBB): The target area for the L3 medial branch is at the junction of the postero-lateral aspect of the superior articular process and the superior, posterior, and medial edge of the transverse process of L4. Under fluoroscopic guidance, a Quincke needle was inserted until contact was made with os over the superior postero-lateral aspect of the pedicular shadow (target area). After negative aspiration for blood,1 cc  mL of the nerve block solution was injected without difficulty or complication. The needle was removed intact. L4 Medial Branch Nerve Block (MBB): The target area for the L4 medial branch is at the junction of the postero-lateral aspect of the superior articular process and the superior, posterior, and medial edge of the transverse process of L5. Under fluoroscopic guidance, a Quincke needle was inserted until contact was made with os over the superior postero-lateral aspect of the pedicular shadow (target area). After negative aspiration for blood,1cc mL of the nerve block  solution was injected without difficulty or complication. The needle was removed intact. L5 Medial Branch Nerve Block (MBB): The target area for the L5 medial branch is at the junction of the postero-lateral aspect of the superior articular process and the superior, posterior, and medial edge of the sacral ala. Under fluoroscopic guidance, a Quincke needle was inserted until contact was made with os over the superior postero-lateral aspect of the pedicular shadow (target area). After negative aspiration for blood,1cc  mL of the nerve block solution was injected without difficulty or complication. The needle was removed intact. S1 Medial Branch Nerve Block (MBB): The target area for the S1 medial branch is at the posterior and inferior 6 o'clock position of the L5-S1 facet joint. Under fluoroscopic guidance, the Quincke needle inserted for the L5 MBB was redirected until contact was made with os over the inferior and postero aspect of the sacrum, at the 6 o' clock position under the L5-S1 facet joint (Target area). After negative aspiration for blood, 1 cc mL of the nerve block solution was injected without difficulty or complication. The needle was removed intact.  Nerve block solution: 10 cc solution made of 8 cc of 0.2% ropivacaine, 2 cc of Decadron 10 mg/cc.  1-1.5 cc injected at each level above bilaterally  Procedural Needles: 22-gauge, 3.5-inch, Quincke needles used for all levels.  Once the entire procedure was completed, the treated area was cleaned, making sure to leave some of the prepping solution back to take advantage of its long term bactericidal properties.   Illustration of the posterior view of the lumbar spine and the posterior neural structures. Laminae of L2 through S1 are labeled. DPRL5, dorsal primary ramus of L5; DPRS1, dorsal primary ramus of S1; DPR3, dorsal primary ramus of L3; FJ, facet (zygapophyseal) joint L3-L4; I, inferior articular process of L4; LB1, lateral branch of  dorsal primary ramus of L1; IAB, inferior articular branches from L3 medial branch (supplies L4-L5 facet joint); IBP, intermediate branch plexus; MB3, medial branch of dorsal primary ramus of L3; NR3, third lumbar nerve root; S, superior articular process of  L5; SAB, superior articular branches from L4 (supplies L4-5 facet joint also); TP3, transverse process of L3.  Vitals:   04/25/19 1110 04/25/19 1120 04/25/19 1130 04/25/19 1141  BP: (!) 98/53 (!) 120/93 (!) 145/93 137/87  Pulse: 86     Resp: 13 16 15 17   Temp:  (!) 97.4 F (36.3 C)  97.7 F (36.5 C)  TempSrc:      SpO2: 98% 100% 99% 99%  Weight:      Height:         Start Time: 1053 hrs. End Time: 1110 hrs.  Imaging Guidance (Spinal):          Type of Imaging Technique: Fluoroscopy Guidance (Spinal) Indication(s): Assistance in needle guidance and placement for procedures requiring needle placement in or near specific anatomical locations not easily accessible without such assistance. Exposure Time: Please see nurses notes. Contrast: None used. Fluoroscopic Guidance: I was personally present during the use of fluoroscopy. "Tunnel Vision Technique" used to obtain the best possible view of the target area. Parallax error corrected before commencing the procedure. "Direction-depth-direction" technique used to introduce the needle under continuous pulsed fluoroscopy. Once target was reached, antero-posterior, oblique, and lateral fluoroscopic projection used confirm needle placement in all planes. Images permanently stored in EMR. Interpretation: No contrast injected. I personally interpreted the imaging intraoperatively. Adequate needle placement confirmed in multiple planes. Permanent images saved into the patient's record.  Antibiotic Prophylaxis:   Anti-infectives (From admission, onward)   None     Indication(s): None identified  Post-operative Assessment:  Post-procedure Vital Signs:  Pulse/HCG Rate: 8682 Temp: 97.7 F  (36.5 C) Resp: 17 BP: 137/87 SpO2: 99 %  EBL: None  Complications: No immediate post-treatment complications observed by team, or reported by patient.  Note: The patient tolerated the entire procedure well. A repeat set of vitals were taken after the procedure and the patient was kept under observation following institutional policy, for this type of procedure. Post-procedural neurological assessment was performed, showing return to baseline, prior to discharge. The patient was provided with post-procedure discharge instructions, including a section on how to identify potential problems. Should any problems arise concerning this procedure, the patient was given instructions to immediately contact , at any time, without hesitation. In any case, we plan to contact the patient by telephone for a follow-up status report regarding this interventional procedure.  Comments:  No additional relevant information.  Plan of Care   Imaging Orders     DG PAIN CLINIC C-ARM 1-60 MIN NO REPORT  Medications ordered for procedure: Meds ordered this encounter  Medications  . lidocaine (XYLOCAINE) 2 % (with pres) injection 400 mg  . midazolam (VERSED) 5 MG/5ML injection 1-2 mg    Make sure Flumazenil is available in the pyxis when using this medication. If oversedation occurs, administer 0.2 mg IV over 15 sec. If after 45 sec no response, administer 0.2 mg again over 1 min; may repeat at 1 min intervals; not to exceed 4 doses (1 mg)  . fentaNYL (SUBLIMAZE) injection 25-50 mcg    Make sure Narcan is available in the pyxis when using this medication. In the event of respiratory depression (RR< 8/min): Titrate NARCAN (naloxone) in increments of 0.1 to 0.2 mg IV at 2-3 minute intervals, until desired degree of reversal.  . ropivacaine (PF) 2 mg/mL (0.2%) (NAROPIN) injection 9 mL  . ropivacaine (PF) 2 mg/mL (0.2%) (NAROPIN) injection 9 mL  . dexamethasone (DECADRON) injection 10 mg  . dexamethasone (DECADRON)  injection 10 mg  Medications administered: We administered lidocaine, midazolam, fentaNYL, ropivacaine (PF) 2 mg/mL (0.2%), ropivacaine (PF) 2 mg/mL (0.2%), dexamethasone, and dexamethasone.  See the medical record for exact dosing, route, and time of administration.  Disposition: Discharge home  Discharge Date & Time: 04/25/2019; 1143 hrs.   Physician-requested Follow-up: Return in about 8 weeks (around 06/20/2019) for Post Procedure Evaluation, virtual.  Future Appointments  Date Time Provider Department Center  06/20/2019  1:30 PM Edward Jolly, MD [facility] None   Primary Care Physician: [facility] Location: [facility] Note by: Edward Jolly, MD Date: 04/25/2019; Time: 12:03 PM  Disclaimer:  Medicine is not an exact science. The only guarantee in medicine is that nothing is guaranteed. It is important to note that the decision to proceed with this intervention was based on the information collected from the patient. The Data and conclusions were drawn from the patient's questionnaire, the interview, and the physical examination. Because the information was provided in large part by the patient, it cannot be guaranteed that it has not been purposely or unconsciously manipulated. Every effort has been made to obtain as much relevant data as possible for this evaluation. It is important to note that the conclusions that lead to this procedure are derived in large part from the available data. Always take into account that the treatment will also be dependent on availability of resources and existing treatment guidelines, considered by other Pain Management Practitioners as being common knowledge and practice, at the time of the intervention. For Medico-Legal purposes, it is also important to point out that variation in procedural techniques and pharmacological choices are the acceptable norm. The indications, contraindications, technique, and  results of the above procedure should only be interpreted and judged by a Board-Certified Interventional Pain Specialist with extensive familiarity and expertise in the same exact procedure and technique.

## 2019-04-25 NOTE — Progress Notes (Signed)
Safety precautions to be maintained throughout the outpatient stay will include: orient to surroundings, keep bed in low position, maintain call bell within reach at all times, provide assistance with transfer out of bed and ambulation.  

## 2019-04-26 ENCOUNTER — Telehealth: Payer: Self-pay | Admitting: *Deleted

## 2019-04-26 NOTE — Telephone Encounter (Signed)
No problems post procedure. 

## 2019-06-08 DIAGNOSIS — F411 Generalized anxiety disorder: Secondary | ICD-10-CM | POA: Diagnosis not present

## 2019-06-08 DIAGNOSIS — F331 Major depressive disorder, recurrent, moderate: Secondary | ICD-10-CM | POA: Diagnosis not present

## 2019-06-18 DIAGNOSIS — F411 Generalized anxiety disorder: Secondary | ICD-10-CM | POA: Diagnosis not present

## 2019-06-20 ENCOUNTER — Ambulatory Visit: Payer: Medicaid Other | Admitting: Student in an Organized Health Care Education/Training Program

## 2019-06-20 ENCOUNTER — Encounter: Payer: Self-pay | Admitting: Student in an Organized Health Care Education/Training Program

## 2019-06-21 ENCOUNTER — Ambulatory Visit
Payer: Medicaid Other | Attending: Student in an Organized Health Care Education/Training Program | Admitting: Student in an Organized Health Care Education/Training Program

## 2019-06-21 ENCOUNTER — Other Ambulatory Visit: Payer: Self-pay

## 2019-06-21 DIAGNOSIS — M47816 Spondylosis without myelopathy or radiculopathy, lumbar region: Secondary | ICD-10-CM

## 2019-06-21 NOTE — Progress Notes (Signed)
Cancelled visit

## 2019-06-22 ENCOUNTER — Telehealth: Payer: Self-pay | Admitting: *Deleted

## 2019-06-22 NOTE — Telephone Encounter (Signed)
Pt was called back and his appointment was reschedule for June 1 at 8:45. The computer system went down on Thursday evening and Dr. Cherylann Ratel wasn't able to call him. I apologized for his inconvenient.

## 2019-06-26 ENCOUNTER — Encounter: Payer: Self-pay | Admitting: Student in an Organized Health Care Education/Training Program

## 2019-06-26 ENCOUNTER — Other Ambulatory Visit: Payer: Self-pay

## 2019-06-26 ENCOUNTER — Ambulatory Visit
Payer: Medicaid Other | Attending: Student in an Organized Health Care Education/Training Program | Admitting: Student in an Organized Health Care Education/Training Program

## 2019-06-26 DIAGNOSIS — G894 Chronic pain syndrome: Secondary | ICD-10-CM

## 2019-06-26 DIAGNOSIS — M47816 Spondylosis without myelopathy or radiculopathy, lumbar region: Secondary | ICD-10-CM | POA: Diagnosis not present

## 2019-06-26 DIAGNOSIS — M961 Postlaminectomy syndrome, not elsewhere classified: Secondary | ICD-10-CM

## 2019-06-26 DIAGNOSIS — M48062 Spinal stenosis, lumbar region with neurogenic claudication: Secondary | ICD-10-CM | POA: Diagnosis not present

## 2019-06-26 NOTE — Progress Notes (Signed)
Patient: Ryan [name]  Service Category: E/M  Provider: [name], MD  DOB: [birth year]  DOS: 06/26/2019  Location: Office  MRN: [id number]  Setting: Ambulatory outpatient  Referring Provider: [facility] [place]  Type: Established Patient  Specialty: Interventional Pain Management  PCP: [facility], [place]  Location: Home  Delivery: TeleHealth     Virtual Encounter - Pain Management PROVIDER NOTE: Information contained herein reflects review and annotations entered in association with encounter. Interpretation of such information and data should be left to medically-trained personnel. Information provided to patient can be located elsewhere in the medical record under "Patient Instructions". Document created using STT-dictation technology, any transcriptional errors that may result from process are unintentional.    Contact & Pharmacy Preferred: [phone] Home: [phone] (home) Mobile: There is no such number on file (mobile). E-mail: [email or web address]  [facility] [address] ([address]), [place] [address] ([place]) [place] [address] Phone: [phone] Fax: [phone]   Pre-screening  Mr. Thall offered "in-person" vs "virtual" encounter. He indicated preferring virtual for this encounter.   Reason COVID-19*   Social distancing based on CDC and AMA recommendations.   I contacted SHAWN CARATTINI on 06/26/2019 via video conference.      I clearly identified myself as 19/01/2019, MD. I verified that I was speaking with the correct person using two identifiers (Name: OLIVERIO CHO, and date of birth: Jan 18, 1956).  Consent I sought verbal advanced consent from 11/25/1955 for virtual visit interactions. I informed Mr. Mcmanamon of possible security and privacy concerns, risks, and limitations associated with providing "not-in-person" medical evaluation and management services. I also informed Mr. Schlageter of the availability  of "in-person" appointments. Finally, I informed him that there would be a charge for the virtual visit and that he could be  personally, fully or partially, financially responsible for it. Mr. Gallicchio expressed understanding and agreed to proceed.   Historic Elements   Mr. TREYSHAWN MULDREW is a 64 y.o. year old, male patient evaluated today after his last contact with our practice on 06/22/2019. Mr. Vineyard  has a past medical history of Arthritis, Asthma, Chronic kidney disease, COPD (chronic obstructive pulmonary disease) ([place]), Dyspnea, GERD (gastroesophageal reflux disease), Hyperlipidemia, Hypertension, Lower urinary tract symptoms (LUTS), Wears dentures, and Wears hearing aid. He also  has a past surgical history that includes Eye surgery; Hernia repair; Transforaminal lumbar interbody fusion (tlif) with pedicle screw fixation 1 level (Right, 06/18/2014); Myringotomy with tube placement (Bilateral, 03/28/2015); Back surgery; Esophagogastroduodenoscopy (egd) with propofol (N/A, 10/24/2015); Cataract extraction w/PHACO (Left, 01/06/2016); Tonsillectomy; Colonoscopy with propofol (N/A, 04/19/2016); and Sinusotomy (02/28/2019). Mr. Theard has a current medication list which includes the following prescription(s): acetaminophen, albuterol, alprazolam, cetirizine, cyclobenzaprine, fluticasone, gabapentin, lisinopril, multivitamin, omeprazole, sildenafil, and tamsulosin. He  reports that he has been smoking cigarettes. He has a 60.00 pack-year smoking history. He has never used smokeless tobacco. He reports that he does not drink alcohol or use drugs. Mr. Adachi has No Known Allergies.   HPI  Today, he is being contacted for a post-procedure assessment.  Evaluation of last interventional procedure  04/25/19 Procedure:   Type: Lumbar Facet, Medial Branch Block(s) #2  (#1 done 03/20/2018) Primary Purpose: Diagnostic Region: Posterolateral Lumbosacral Spine Level: L3, L4, L5, & S1 Medial Branch Level(s). Injecting these  levels blocks the L3-4, L4-5, and L5-S1 lumbar facet joints. Laterality: Bilateral  Sedation: Please see nurses note for DOS. When no sedatives are used, the  analgesic levels obtained are directly associated to the effectiveness of the local anesthetics. However, when sedation is provided, the level of analgesia obtained during the initial 1 hour following the intervention, is believed to be the result of a combination of factors. These factors may include, but are not limited to: 1. The effectiveness of the local anesthetics used. 2. The effects of the analgesic(s) and/or anxiolytic(s) used. 3. The degree of discomfort experienced by the patient at the time of the procedure. 4. The patients ability and reliability in recalling and recording the events. 5. The presence and influence of possible secondary gains and/or psychosocial factors. Reported result: Relief experienced during the 1st hour after the procedure: 100%   (Ultra-Short Term Relief)            Interpretative annotation: Clinically appropriate result. Analgesia during this period is likely to be Local Anesthetic and/or IV Sedative (Analgesic/Anxiolytic) related.          Effects of local anesthetic: The analgesic effects attained during this period are directly associated to the localized infiltration of local anesthetics and therefore cary significant diagnostic value as to the etiological location, or anatomical origin, of the pain. Expected duration of relief is directly dependent on the pharmacodynamics of the local anesthetic used. Long-acting (4-6 hours) anesthetics used.  Reported result: Relief during the next 4 to 6 hour after the procedure: 100%   (Short-Term Relief)            Interpretative annotation: Clinically appropriate result. Analgesia during this period is likely to be Local Anesthetic-related.          Long-term benefit: Defined as the period of time past the expected duration of local anesthetics (1 hour for  short-acting and 4-6 hours for long-acting). With the possible exception of prolonged sympathetic blockade from the local anesthetics, benefits during this period are typically attributed to, or associated with, other factors such as analgesic sensory neuropraxia, antiinflammatory effects, or beneficial biochemical changes provided by agents other than the local anesthetics.  Reported result: Extended relief following procedure: 75% for 3 weeks with gradual return of pain thereafter   (Long-Term Relief)            Interpretative annotation: Clinically appropriate result. Good relief. No permanent benefit expected. Inflammation plays a part in the etiology to the pain.           Laboratory Chemistry Profile   Renal Lab Results  Component Value Date   BUN 22 10/19/2017   CREATININE 0.95 10/19/2017   GFRAA >60 10/19/2017   GFRNONAA >60 10/19/2017     Hepatic Lab Results  Component Value Date   AST 18 10/19/2017   ALT 17 10/19/2017   ALBUMIN 4.2 10/19/2017   ALKPHOS 104 10/19/2017   LIPASE 77 (H) 10/19/2017     Electrolytes Lab Results  Component Value Date   NA 140 10/19/2017   K 4.9 10/19/2017   CL 107 10/19/2017   CALCIUM 9.5 10/19/2017     Bone No results found for: VD25OH, VD125OH2TOT, BC4888BV6, XI5038UE2, 25OHVITD1, 25OHVITD2, 25OHVITD3, TESTOFREE, TESTOSTERONE   Inflammation (CRP: Acute Phase) (ESR: Chronic Phase) No results found for: CRP, ESRSEDRATE, LATICACIDVEN     Note: Above Lab results reviewed.  Imaging  DG PAIN CLINIC C-ARM 1-60 MIN NO REPORT Fluoro was used, but no Radiologist interpretation will be provided.  Please refer to "NOTES" tab for provider progress note.  Assessment  The primary encounter diagnosis was Lumbar spondylosis. Diagnoses of Lumbar facet arthropathy, Spinal stenosis, lumbar  region, with neurogenic claudication, Failed back surgical syndrome, and Chronic pain syndrome were also pertinent to this visit.  Plan of Care  Mr. MICKAL MENO  has a current medication list which includes the following long-term medication(s): albuterol, cetirizine, fluticasone, and omeprazole.  Patient is status post 2 sets of diagnostic lumbar facet medial branch nerve blocks #1 done on 03/20/2018, #2 done 04/25/2019 bilaterally at L3, L4, L5, S1.  With both of these nerve blocks, patient endorses approximately 75% pain relief in his low back pain and improvement in his functional status for approximately 3 weeks.  He states that the nerve blocks wore off after 3 weeks and he has had gradual return of pain thereafter.  He is currently endorsing approximately 25% pain relief from his nerve block that he had on 04/25/2019 however he did have a peak relief of 75% for 3 weeks.  We discussed the next steps which could include lumbar radiofrequency ablation.  Risks and benefits reviewed and patient would like to proceed.  Orders:  Orders Placed This Encounter  Procedures   Radiofrequency,Lumbar    Standing Status:   Future    Standing Expiration Date:   06/25/2020    Scheduling Instructions:     Side(s): Left-sided     Level: L3-4, L4-5, & L5-S1 Facets (L3, L4, L5, Medial Branch Nerves)     Sedation: With Sedation     Scheduling Timeframe: As soon as pre-approved    Order Specific Question:   Where will this procedure be performed?    Answer:   [facility]   Radiofrequency,Lumbar    Standing Status:   Future    Standing Expiration Date:   06/25/2020    Scheduling Instructions:     Side(s): RIGHT     Level: L3-4, L4-5, & L5-S1 Facets (L3, L4, L5, Medial Branch Nerves)     Sedation: With Sedation     Scheduling Timeframe: 2 weeks after left    Order Specific Question:   Where will this procedure be performed?    Answer:   [facility]   Follow-up plan:   Return in about 2 weeks (around 07/10/2019) for Left L3, L4, L5 RFA, with sedation.     Status post bilateral L3, L4, L5, S1 lumbar facet medial branch nerve blocks on 03/20/2018 helped  85% pain relief for approximately 1 year    Recent Visits Date Type Provider Dept  06/21/19 Office Visit [name], MD [facility]  04/25/19 Procedure visit [name], MD [facility]  04/18/19 Office Visit [name], MD [facility]  Showing recent visits within past 90 days and meeting all other requirements   Today's Visits Date Type Provider Dept  06/26/19 Telemedicine [name], MD [facility]  Showing today's visits and meeting all other requirements   Future Appointments No visits were found meeting these conditions.  Showing future appointments within next 90 days and meeting all other requirements   I discussed the assessment and treatment plan with the patient. The patient was provided an opportunity to ask questions and all were answered. The patient agreed with the plan and demonstrated an understanding of the instructions.  Patient advised to call back or seek an in-person evaluation if the symptoms or condition worsens.  Duration of encounter: 25 minutes.  Note by: [name], MD Date: 06/26/2019; Time: 9:08 AM

## 2019-06-27 DIAGNOSIS — J310 Chronic rhinitis: Secondary | ICD-10-CM | POA: Diagnosis not present

## 2019-07-11 ENCOUNTER — Ambulatory Visit (HOSPITAL_BASED_OUTPATIENT_CLINIC_OR_DEPARTMENT_OTHER): Payer: Medicaid Other | Admitting: Student in an Organized Health Care Education/Training Program

## 2019-07-11 ENCOUNTER — Ambulatory Visit
Admission: RE | Admit: 2019-07-11 | Discharge: 2019-07-11 | Disposition: A | Discharge status: Home / Self Care | Payer: Medicaid Other | Source: Ambulatory Visit | Attending: Student in an Organized Health Care Education/Training Program | Admitting: Student in an Organized Health Care Education/Training Program

## 2019-07-11 ENCOUNTER — Encounter: Payer: Self-pay | Admitting: Student in an Organized Health Care Education/Training Program

## 2019-07-11 ENCOUNTER — Other Ambulatory Visit: Payer: Self-pay

## 2019-07-11 VITALS — BP 122/85 | HR 78 | Temp 97.6°F | Resp 16 | Ht 67.0 in | Wt 175.0 lb

## 2019-07-11 DIAGNOSIS — M47816 Spondylosis without myelopathy or radiculopathy, lumbar region: Secondary | ICD-10-CM

## 2019-07-11 MED ORDER — FENTANYL CITRATE (PF) 100 MCG/2ML IJ SOLN
25.0000 ug | INTRAMUSCULAR | Status: DC | PRN
  Administered 2019-07-11: 50 ug via INTRAVENOUS

## 2019-07-11 MED ORDER — LIDOCAINE HCL 2 % IJ SOLN
INTRAMUSCULAR | Status: AC
  Filled 2019-07-11: qty 20

## 2019-07-11 MED ORDER — ROPIVACAINE HCL 2 MG/ML IJ SOLN
9.0000 mL | Freq: Once | INTRAMUSCULAR | Status: AC
  Administered 2019-07-11: 9 mL via PERINEURAL

## 2019-07-11 MED ORDER — MIDAZOLAM HCL 5 MG/5ML IJ SOLN
1.0000 mg | INTRAMUSCULAR | Status: DC | PRN
  Administered 2019-07-11: 1 mg via INTRAVENOUS

## 2019-07-11 MED ORDER — FENTANYL CITRATE (PF) 100 MCG/2ML IJ SOLN
INTRAMUSCULAR | Status: AC
  Filled 2019-07-11: qty 2

## 2019-07-11 MED ORDER — LACTATED RINGERS IV SOLN: 1000.0000 mL | Freq: Once | INTRAVENOUS | Status: DC

## 2019-07-11 MED ORDER — MIDAZOLAM HCL 5 MG/5ML IJ SOLN
INTRAMUSCULAR | Status: AC
  Filled 2019-07-11: qty 5

## 2019-07-11 MED ORDER — LIDOCAINE HCL 2 % IJ SOLN
20.0000 mL | Freq: Once | INTRAMUSCULAR | Status: AC
  Administered 2019-07-11: 400 mg

## 2019-07-11 MED ORDER — IOHEXOL 180 MG/ML  SOLN: 10.0000 mL | Freq: Once | INTRAMUSCULAR | Status: DC

## 2019-07-11 MED ORDER — DEXAMETHASONE SODIUM PHOSPHATE 10 MG/ML IJ SOLN
INTRAMUSCULAR | Status: AC
  Filled 2019-07-11: qty 1

## 2019-07-11 MED ORDER — DEXAMETHASONE SODIUM PHOSPHATE 10 MG/ML IJ SOLN
10.0000 mg | Freq: Once | INTRAMUSCULAR | Status: AC
  Administered 2019-07-11: 10 mg

## 2019-07-11 MED ORDER — ROPIVACAINE HCL 2 MG/ML IJ SOLN
INTRAMUSCULAR | Status: AC
  Filled 2019-07-11: qty 10

## 2019-07-11 NOTE — Patient Instructions (Addendum)
____________________________________________________________________________________________  Post-Procedure Discharge Instructions  Instructions:  Apply ice:   Purpose: This will minimize any swelling and discomfort after procedure.   When: Day of procedure, as soon as you get home.  How: Fill a plastic sandwich bag with crushed ice. Cover it with a Belay towel and apply to injection site.  How long: (15 min on, 15 min off) Apply for 15 minutes then remove x 15 minutes.  Repeat sequence on day of procedure, until you go to bed.  Apply heat:   Purpose: To treat any soreness and discomfort from the procedure.  When: Starting the next day after the procedure.  How: Apply heat to procedure site starting the day following the procedure.  How long: May continue to repeat daily, until discomfort goes away.  Food intake: Start with clear liquids (like water) and advance to regular food, as tolerated.   Physical activities: Keep activities to a minimum for the first 8 hours after the procedure. After that, then as tolerated.  Driving: If you have received any sedation, be responsible and do not drive. You are not allowed to drive for 24 hours after having sedation.  Blood thinner: (Applies only to those taking blood thinners) You may restart your blood thinner 6 hours after your procedure.  Insulin: (Applies only to Diabetic patients taking insulin) As soon as you can eat, you may resume your normal dosing schedule.  Infection prevention: Keep procedure site clean and dry. Shower daily and clean area with soap and water.  Post-procedure Pain Diary: Extremely important that this be done correctly and accurately. Recorded information will be used to determine the next step in treatment. For the purpose of accuracy, follow these rules:  Evaluate only the area treated. Do not report or include pain from an untreated area. For the purpose of this evaluation, ignore all other areas of pain,  except for the treated area.  After your procedure, avoid taking a long nap and attempting to complete the pain diary after you wake up. Instead, set your alarm clock to go off every hour, on the hour, for the initial 8 hours after the procedure. Document the duration of the numbing medicine, and the relief you are getting from it.  Do not go to sleep and attempt to complete it later. It will not be accurate. If you received sedation, it is likely that you were given a medication that may cause amnesia. Because of this, completing the diary at a later time may cause the information to be inaccurate. This information is needed to plan your care.  Follow-up appointment: Keep your post-procedure follow-up evaluation appointment after the procedure (usually 2 weeks for most procedures, 6 weeks for radiofrequencies). DO NOT FORGET to bring you pain diary with you.   Expect: (What should I expect to see with my procedure?)  From numbing medicine (AKA: Local Anesthetics): Numbness or decrease in pain. You may also experience some weakness, which if present, could last for the duration of the local anesthetic.  Onset: Full effect within 15 minutes of injected.  Duration: It will depend on the type of local anesthetic used. On the average, 1 to 8 hours.   From steroids (Applies only if steroids were used): Decrease in swelling or inflammation. Once inflammation is improved, relief of the pain will follow.  Onset of benefits: Depends on the amount of swelling present. The more swelling, the longer it will take for the benefits to be seen. In some cases, up to 10 days.    may typically last 2 weeks (the duration of the steroids). Frequent: Cramps (if they occur, drink Gatorade and take over-the-counter Magnesium 450-500 mg once to twice a day); water retention with  temporary weight gain; increases in blood sugar; decreased immune system response; increased appetite. Occasional: Facial flushing (red, warm cheeks); mood swings; menstrual changes. Uncommon: Long-term decrease or suppression of natural hormones; bone thinning. (These are more common with higher doses or more frequent use. This is why we prefer that our patients avoid having any injection therapies in other practices.)  Very Rare: Severe mood changes; psychosis; aseptic necrosis. From procedure: Some discomfort is to be expected once the numbing medicine wears off. This should be minimal if ice and heat are applied as instructed.  Call if: (When should I call?) You experience numbness and weakness that gets worse with time, as opposed to wearing off. New onset bowel or bladder incontinence. (Applies only to procedures done in the spine)  Emergency Numbers: Durning business hours (Monday - Thursday, 8:00 AM - 4:00 PM) (Friday, 9:00 AM - 12:00 Noon): [phone] After hours: [phone] NOTE: If you are having a problem and are unable connect with, or to talk to a provider, then go to your nearest urgent care or emergency department. If the problem is serious and urgent, please call 911. ____________________________________________________________________________________________   ____________________________________________________________________________________________  Post-Procedure Discharge Instructions  Instructions: Apply ice:  Purpose: This will minimize any swelling and discomfort after procedure.  When: Day of procedure, as soon as you get home. How: Fill a plastic sandwich bag with crushed ice. Cover it with a [place] towel and apply to injection site. How long: (15 min on, 15 min off) Apply for 15 minutes then remove x 15 minutes.  Repeat sequence on day of procedure, until you go to bed. Apply heat:  Purpose: To treat any soreness and discomfort from the  procedure. When: Starting the next day after the procedure. How: Apply heat to procedure site starting the day following the procedure. How long: May continue to repeat daily, until discomfort goes away. Food intake: Start with clear liquids (like water) and advance to regular food, as tolerated.  Physical activities: Keep activities to a minimum for the first 8 hours after the procedure. After that, then as tolerated. Driving: If you have received any sedation, be responsible and do not drive. You are not allowed to drive for 24 hours after having sedation. Blood thinner: (Applies only to those taking blood thinners) You may restart your blood thinner 6 hours after your procedure. Insulin: (Applies only to Diabetic patients taking insulin) As soon as you can eat, you may resume your normal dosing schedule. Infection prevention: Keep procedure site clean and dry. Shower daily and clean area with soap and water. Post-procedure Pain Diary: Extremely important that this be done correctly and accurately. Recorded information will be used to determine the next step in treatment. For the purpose of accuracy, follow these rules: Evaluate only the area treated. Do not report or include pain from an untreated area. For the purpose of this evaluation, ignore all other areas of pain, except for the treated area. After your procedure, avoid taking a long nap and attempting to complete the pain diary after you wake up. Instead, set your alarm clock to go off every hour, on the hour, for the initial 8 hours after the procedure. Document the duration of the numbing medicine, and the relief you are getting from it. Do not go to sleep and attempt   long nap and attempting to complete the pain diary after you wake up. Instead, set your alarm clock to go off every hour, on the hour, for the initial 8 hours after the procedure. Document the duration of the numbing medicine, and the relief you are getting from it.  Do not go to sleep and attempt to complete it later. It will not be accurate. If you received sedation, it is likely that you were given a medication that may cause amnesia. Because of this, completing the diary at a later  time may cause the information to be inaccurate. This information is needed to plan your care.  Follow-up appointment: Keep your post-procedure follow-up evaluation appointment after the procedure (usually 2 weeks for most procedures, 6 weeks for radiofrequencies). DO NOT FORGET to bring you pain diary with you.   Expect: (What should I expect to see with my procedure?)  From numbing medicine (AKA: Local Anesthetics): Numbness or decrease in pain. You may also experience some weakness, which if present, could last for the duration of the local anesthetic.  Onset: Full effect within 15 minutes of injected.  Duration: It will depend on the type of local anesthetic used. On the average, 1 to 8 hours.   From steroids (Applies only if steroids were used): Decrease in swelling or inflammation. Once inflammation is improved, relief of the pain will follow.  Onset of benefits: Depends on the amount of swelling present. The more swelling, the longer it will take for the benefits to be seen. In some cases, up to 10 days.  Duration: Steroids will stay in the system x 2 weeks. Duration of benefits will depend on multiple posibilities including persistent irritating factors.  Side-effects: If present, they may typically last 2 weeks (the duration of the steroids).  Frequent: Cramps (if they occur, drink Gatorade and take over-the-counter Magnesium 450-500 mg once to twice a day); water retention with temporary weight gain; increases in blood sugar; decreased immune system response; increased appetite.  Occasional: Facial flushing (red, warm cheeks); mood swings; menstrual changes.  Uncommon: Long-term decrease or suppression of natural hormones; bone thinning. (These are more common with higher doses or more frequent use. This is why we prefer that our patients avoid having any injection therapies in other practices.)   Very Rare: Severe mood changes; psychosis; aseptic necrosis.  From procedure: Some  discomfort is to be expected once the numbing medicine wears off. This should be minimal if ice and heat are applied as instructed.  Call if: (When should I call?)  You experience numbness and weakness that gets worse with time, as opposed to wearing off.  New onset bowel or bladder incontinence. (Applies only to procedures done in the spine)  Emergency Numbers:  Durning business hours (Monday - Thursday, 8:00 AM - 4:00 PM) (Friday, 9:00 AM - 12:00 Noon): [phone]  After hours: [phone]  NOTE: If you are having a problem and are unable connect with, or to talk to a provider, then go to your nearest urgent care or emergency department. If the problem is serious and urgent, please call 911. ____________________________________________________________________________________________   ____________________________________________________________________________________________  Post-Procedure Discharge Instructions  Instructions:  Apply ice:   Purpose: This will minimize any swelling and discomfort after procedure.   When: Day of procedure, as soon as you get home.  How: Fill a plastic sandwich bag with crushed ice. Cover it with a [place] towel and apply to injection site.  How long: (15 min on, 15 min off)  Apply for 15 minutes then remove x 15 minutes.  Repeat sequence on day of procedure, until you go to bed.  Apply heat:   Purpose: To treat any soreness and discomfort from the procedure.  When: Starting the next day after the procedure.  How: Apply heat to procedure site starting the day following the procedure.  How long: May continue to repeat daily, until discomfort goes away.  Food intake: Start with clear liquids (like water) and advance to regular food, as tolerated.   Physical activities: Keep activities to a minimum for the first 8 hours after the procedure. After that, then as tolerated.  Driving: If you have received any sedation, be responsible and  do not drive. You are not allowed to drive for 24 hours after having sedation.  Blood thinner: (Applies only to those taking blood thinners) You may restart your blood thinner 6 hours after your procedure.  Insulin: (Applies only to Diabetic patients taking insulin) As soon as you can eat, you may resume your normal dosing schedule.  Infection prevention: Keep procedure site clean and dry. Shower daily and clean area with soap and water.  Post-procedure Pain Diary: Extremely important that this be done correctly and accurately. Recorded information will be used to determine the next step in treatment. For the purpose of accuracy, follow these rules:  Evaluate only the area treated. Do not report or include pain from an untreated area. For the purpose of this evaluation, ignore all other areas of pain, except for the treated area.  After your procedure, avoid taking a long nap and attempting to complete the pain diary after you wake up. Instead, set your alarm clock to go off every hour, on the hour, for the initial 8 hours after the procedure. Document the duration of the numbing medicine, and the relief you are getting from it.  Do not go to sleep and attempt to complete it later. It will not be accurate. If you received sedation, it is likely that you were given a medication that may cause amnesia. Because of this, completing the diary at a later time may cause the information to be inaccurate. This information is needed to plan your care.  Follow-up appointment: Keep your post-procedure follow-up evaluation appointment after the procedure (usually 2 weeks for most procedures, 6 weeks for radiofrequencies). DO NOT FORGET to bring you pain diary with you.   Expect: (What should I expect to see with my procedure?)  From numbing medicine (AKA: Local Anesthetics): Numbness or decrease in pain. You may also experience some weakness, which if present, could last for the duration of the local  anesthetic.  Onset: Full effect within 15 minutes of injected.  Duration: It will depend on the type of local anesthetic used. On the average, 1 to 8 hours.   From steroids (Applies only if steroids were used): Decrease in swelling or inflammation. Once inflammation is improved, relief of the pain will follow.  Onset of benefits: Depends on the amount of swelling present. The more swelling, the longer it will take for the benefits to be seen. In some cases, up to 10 days.  Duration: Steroids will stay in the system x 2 weeks. Duration of benefits will depend on multiple posibilities including persistent irritating factors.  Side-effects: If present, they may typically last 2 weeks (the duration of the steroids).  Frequent: Cramps (if they occur, drink Gatorade and take over-the-counter Magnesium 450-500 mg once to twice a day); water retention with temporary weight gain;  increases in blood sugar; decreased immune system response; increased appetite.  Occasional: Facial flushing (red, warm cheeks); mood swings; menstrual changes.  Uncommon: Long-term decrease or suppression of natural hormones; bone thinning. (These are more common with higher doses or more frequent use. This is why we prefer that our patients avoid having any injection therapies in other practices.)   Very Rare: Severe mood changes; psychosis; aseptic necrosis.  From procedure: Some discomfort is to be expected once the numbing medicine wears off. This should be minimal if ice and heat are applied as instructed.  Call if: (When should I call?)  You experience numbness and weakness that gets worse with time, as opposed to wearing off.  New onset bowel or bladder incontinence. (Applies only to procedures done in the spine)  Emergency Numbers:  Durning business hours (Monday - Thursday, 8:00 AM - 4:00 PM) (Friday, 9:00 AM - 12:00 Noon): [phone]  After hours: [phone]  NOTE: If you are having a problem  and are unable connect with, or to talk to a provider, then go to your nearest urgent care or emergency department. If the problem is serious and urgent, please call 911. ____________________________________________________________________________________________   ____________________________________________________________________________________________  Post-Procedure Discharge Instructions  Instructions:  Apply ice:   Purpose: This will minimize any swelling and discomfort after procedure.   When: Day of procedure, as soon as you get home.  How: Fill a plastic sandwich bag with crushed ice. Cover it with a [place] towel and apply to injection site.  How long: (15 min on, 15 min off) Apply for 15 minutes then remove x 15 minutes.  Repeat sequence on day of procedure, until you go to bed.  Apply heat:   Purpose: To treat any soreness and discomfort from the procedure.  When: Starting the next day after the procedure.  How: Apply heat to procedure site starting the day following the procedure.  How long: May continue to repeat daily, until discomfort goes away.  Food intake: Start with clear liquids (like water) and advance to regular food, as tolerated.   Physical activities: Keep activities to a minimum for the first 8 hours after the procedure. After that, then as tolerated.  Driving: If you have received any sedation, be responsible and do not drive. You are not allowed to drive for 24 hours after having sedation.  Blood thinner: (Applies only to those taking blood thinners) You may restart your blood thinner 6 hours after your procedure.  Insulin: (Applies only to Diabetic patients taking insulin) As soon as you can eat, you may resume your normal dosing schedule.  Infection prevention: Keep procedure site clean and dry. Shower daily and clean area with soap and water.  Post-procedure Pain Diary: Extremely important that this be done correctly and accurately. Recorded  information will be used to determine the next step in treatment. For the purpose of accuracy, follow these rules:  Evaluate only the area treated. Do not report or include pain from an untreated area. For the purpose of this evaluation, ignore all other areas of pain, except for the treated area.  After your procedure, avoid taking a long nap and attempting to complete the pain diary after you wake up. Instead, set your alarm clock to go off every hour, on the hour, for the initial 8 hours after the procedure. Document the duration of the numbing medicine, and the relief you are getting from it.  Do not go to sleep and attempt to complete it later. It  will not be accurate. If you received sedation, it is likely that you were given a medication that may cause amnesia. Because of this, completing the diary at a later time may cause the information to be inaccurate. This information is needed to plan your care.  Follow-up appointment: Keep your post-procedure follow-up evaluation appointment after the procedure (usually 2 weeks for most procedures, 6 weeks for radiofrequencies). DO NOT FORGET to bring you pain diary with you.   Expect: (What should I expect to see with my procedure?)  From numbing medicine (AKA: Local Anesthetics): Numbness or decrease in pain. You may also experience some weakness, which if present, could last for the duration of the local anesthetic.  Onset: Full effect within 15 minutes of injected.  Duration: It will depend on the type of local anesthetic used. On the average, 1 to 8 hours.   From steroids (Applies only if steroids were used): Decrease in swelling or inflammation. Once inflammation is improved, relief of the pain will follow.  Onset of benefits: Depends on the amount of swelling present. The more swelling, the longer it will take for the benefits to be seen. In some cases, up to 10 days.  Duration: Steroids will stay in the system x 2 weeks. Duration of  benefits will depend on multiple posibilities including persistent irritating factors.  Side-effects: If present, they may typically last 2 weeks (the duration of the steroids).  Frequent: Cramps (if they occur, drink Gatorade and take over-the-counter Magnesium 450-500 mg once to twice a day); water retention with temporary weight gain; increases in blood sugar; decreased immune system response; increased appetite.  Occasional: Facial flushing (red, warm cheeks); mood swings; menstrual changes.  Uncommon: Long-term decrease or suppression of natural hormones; bone thinning. (These are more common with higher doses or more frequent use. This is why we prefer that our patients avoid having any injection therapies in other practices.)   Very Rare: Severe mood changes; psychosis; aseptic necrosis.  From procedure: Some discomfort is to be expected once the numbing medicine wears off. This should be minimal if ice and heat are applied as instructed.  Call if: (When should I call?)  You experience numbness and weakness that gets worse with time, as opposed to wearing off.  New onset bowel or bladder incontinence. (Applies only to procedures done in the spine)  Emergency Numbers:  Durning business hours (Monday - Thursday, 8:00 AM - 4:00 PM) (Friday, 9:00 AM - 12:00 Noon): [phone]  After hours: [phone]  NOTE: If you are having a problem and are unable connect with, or to talk to a provider, then go to your nearest urgent care or emergency department. If the problem is serious and urgent, please call 911. ____________________________________________________________________________________________

## 2019-07-11 NOTE — Progress Notes (Signed)
PROVIDER NOTE: Information contained herein reflects review and annotations entered in association with encounter. Interpretation of such information and data should be left to medically-trained personnel. Information provided to patient can be located elsewhere in the medical record under "Patient Instructions". Document created using STT-dictation technology, any transcriptional errors that may result from process are unintentional.    Patient: Ryan [name]  Service Category: Procedure  Provider: Edward Jolly, MD  DOB: 10-13-1955  DOS: 07/11/2019  Location: [facility]  MRN: [id number]  Setting: Ambulatory - outpatient  Referring Provider: [facility]  Type: Established Patient  Specialty: Interventional Pain Management  PCP: [facility]   Primary Reason for Visit: Interventional Pain Management Treatment. CC: Back Pain (right)  Procedure:          Anesthesia, Analgesia, Anxiolysis:  Type: Thermal Lumbar Facet, Medial Branch Radiofrequency Ablation/Neurotomy  #1  Primary Purpose: Therapeutic Region: Posterolateral Lumbosacral Spine Level: L3, L4, L5,  Medial Branch Level(s). These levels will denervate the L3-4, L4-5, lumbar facet joints. Laterality: Left  Type: Moderate (Conscious) Sedation combined with Local Anesthesia Indication(s): Analgesia and Anxiety Route: Intravenous (IV) IV Access: Secured Sedation: Meaningful verbal contact was maintained at all times during the procedure  Local Anesthetic: Lidocaine 1-2%  Position: Prone   Indications: 1. Lumbar spondylosis   2. Lumbar facet arthropathy    Ryan [name] has been dealing with the above chronic pain for longer than three months and has either failed to respond, was unable to tolerate, or simply did not get enough benefit from other more conservative therapies including, but not limited to: 1. Over-the-counter medications 2. Anti-inflammatory medications 3. Muscle  relaxants 4. Membrane stabilizers 5. Opioids 6. Physical therapy and/or chiropractic manipulation 7. Modalities (Heat, ice, etc.) 8. Invasive techniques such as nerve blocks. Ryan [name] has attained more than 50% relief of the pain from a series of diagnostic injections conducted in separate occasions.  Pain Score: Pre-procedure: 6 /10 Post-procedure: 0-No pain/10  Pre-op Assessment:  Ryan [name] is a 64 y.o. (year old), male patient, seen today for interventional treatment. He  has a past surgical history that includes Eye surgery; Hernia repair; Transforaminal lumbar interbody fusion (tlif) with pedicle screw fixation 1 level (Right, 06/18/2014); Myringotomy with tube placement (Bilateral, 03/28/2015); Back surgery; Esophagogastroduodenoscopy (egd) with propofol (N/A, 10/24/2015); Cataract extraction w/PHACO (Left, 01/06/2016); Tonsillectomy; Colonoscopy with propofol (N/A, 04/19/2016); and Sinusotomy (02/28/2019). Ryan [name] has a current medication list which includes the following prescription(s): acetaminophen, albuterol, alprazolam, cetirizine, cyclobenzaprine, fluticasone, gabapentin, lisinopril, multivitamin, omeprazole, sildenafil, and tamsulosin, and the following Facility-Administered Medications: fentanyl, iohexol, lactated ringers, and midazolam. His primarily concern today is the Back Pain (right)  Initial Vital Signs:  Pulse/HCG Rate: 78ECG Heart Rate: 80 Temp: (!) 97.3 F (36.3 C) Resp: 18 BP: 126/80 SpO2: 99 %  BMI: Estimated body mass index is 27.41 kg/m as calculated from the following:   Height as of this encounter: 5\' 7"  (1.702 m).   Weight as of this encounter: 175 lb (79.4 kg).  Risk Assessment: Allergies: Reviewed. He has No Known Allergies.  Allergy Precautions: None required Coagulopathies: Reviewed. None identified.  Blood-thinner therapy: None at this time Active Infection(s): Reviewed. None identified. Ryan [name] is afebrile  Site Confirmation: Ryan [name] was  asked to confirm the procedure and laterality before marking the site Procedure checklist: Completed Consent: Before the procedure and under the influence of no sedative(s), amnesic(s), or anxiolytics, the patient was informed of the treatment options, risks and possible  complications. To fulfill our ethical and legal obligations, as recommended by the American Medical Association's Code of Ethics, I have informed the patient of my clinical impression; the nature and purpose of the treatment or procedure; the risks, benefits, and possible complications of the intervention; the alternatives, including doing nothing; the risk(s) and benefit(s) of the alternative treatment(s) or procedure(s); and the risk(s) and benefit(s) of doing nothing. The patient was provided information about the general risks and possible complications associated with the procedure. These may include, but are not limited to: failure to achieve desired goals, infection, bleeding, organ or nerve damage, allergic reactions, paralysis, and death. In addition, the patient was informed of those risks and complications associated to Spine-related procedures, such as failure to decrease pain; infection (i.e.: Meningitis, epidural or intraspinal abscess); bleeding (i.e.: epidural hematoma, subarachnoid hemorrhage, or any other type of intraspinal or peri-dural bleeding); organ or nerve damage (i.e.: Any type of peripheral nerve, nerve root, or spinal cord injury) with subsequent damage to sensory, motor, and/or autonomic systems, resulting in permanent pain, numbness, and/or weakness of one or several areas of the body; allergic reactions; (i.e.: anaphylactic reaction); and/or death. Furthermore, the patient was informed of those risks and complications associated with the medications. These include, but are not limited to: allergic reactions (i.e.: anaphylactic or anaphylactoid reaction(s)); adrenal axis suppression; blood sugar elevation that in  diabetics may result in ketoacidosis or comma; water retention that in patients with history of congestive heart failure may result in shortness of breath, pulmonary edema, and decompensation with resultant heart failure; weight gain; swelling or edema; medication-induced neural toxicity; particulate matter embolism and blood vessel occlusion with resultant organ, and/or nervous system infarction; and/or aseptic necrosis of one or more joints. Finally, the patient was informed that Medicine is not an exact science; therefore, there is also the possibility of unforeseen or unpredictable risks and/or possible complications that may result in a catastrophic outcome. The patient indicated having understood very clearly. We have given the patient no guarantees and we have made no promises. Enough time was given to the patient to ask questions, all of which were answered to the patient's satisfaction. Ryan [name] has indicated that he wanted to continue with the procedure. Attestation: I, the ordering provider, attest that I have discussed with the patient the benefits, risks, side-effects, alternatives, likelihood of achieving goals, and potential problems during recovery for the procedure that I have provided informed consent. Date  Time: 07/11/2019  8:59 AM  Pre-Procedure Preparation:  Monitoring: As per clinic protocol. Respiration, ETCO2, SpO2, BP, heart rate and rhythm monitor placed and checked for adequate function Safety Precautions: Patient was assessed for positional comfort and pressure points before starting the procedure. Time-out: I initiated and conducted the "Time-out" before starting the procedure, as per protocol. The patient was asked to participate by confirming the accuracy of the "Time Out" information. Verification of the correct person, site, and procedure were performed and confirmed by me, the nursing staff, and the patient. "Time-out" conducted as per Joint Commission's Universal Protocol  (UP.01.01.01). Time: 0925  Description of Procedure:          Laterality: Left Levels:   L3, L4, L5,  Medial Branch Level(s), at the L3-4, L4-5,  lumbar facet joints. Area Prepped: Lumbosacral DuraPrep (Iodine Povacrylex [0.7% available iodine] and Isopropyl Alcohol, 74% w/w) Safety Precautions: Aspiration looking for blood return was conducted prior to all injections. At no point did we inject any substances, as a needle was being advanced. Before injecting,  the patient was told to immediately notify me if he was experiencing any new onset of "ringing in the ears, or metallic taste in the mouth". No attempts were made at seeking any paresthesias. Safe injection practices and needle disposal techniques used. Medications properly checked for expiration dates. SDV (single dose vial) medications used. After the completion of the procedure, all disposable equipment used was discarded in the proper designated medical waste containers. Local Anesthesia: Protocol guidelines were followed. The patient was positioned over the fluoroscopy table. The area was prepped in the usual manner. The time-out was completed. The target area was identified using fluoroscopy. A 12-in long, straight, sterile hemostat was used with fluoroscopic guidance to locate the targets for each level blocked. Once located, the skin was marked with an approved surgical skin marker. Once all sites were marked, the skin (epidermis, dermis, and hypodermis), as well as deeper tissues (fat, connective tissue and muscle) were infiltrated with a Piatkowski amount of a short-acting local anesthetic, loaded on a 10cc syringe with a 25G, 1.5-in  Needle. An appropriate amount of time was allowed for local anesthetics to take effect before proceeding to the next step. Local Anesthetic: Lidocaine 2.0% The unused portion of the local anesthetic was discarded in the proper designated containers. Technical explanation of process:  Radiofrequency Ablation  (RFA) L3 Medial Branch Nerve RFA: The target area for the L3 medial branch is at the junction of the postero-lateral aspect of the superior articular process and the superior, posterior, and medial edge of the transverse process of L4. Under fluoroscopic guidance, a Radiofrequency needle was inserted until contact was made with os over the superior postero-lateral aspect of the pedicular shadow (target area). Sensory and motor testing was conducted to properly adjust the position of the needle. Once satisfactory placement of the needle was achieved, the numbing solution was slowly injected after negative aspiration for blood. 2.0 mL of the nerve block solution was injected without difficulty or complication. After waiting for at least 3 minutes, the ablation was performed. Once completed, the needle was removed intact. L4 Medial Branch Nerve RFA: The target area for the L4 medial branch is at the junction of the postero-lateral aspect of the superior articular process and the superior, posterior, and medial edge of the transverse process of L5. Under fluoroscopic guidance, a Radiofrequency needle was inserted until contact was made with os over the superior postero-lateral aspect of the pedicular shadow (target area). Sensory and motor testing was conducted to properly adjust the position of the needle. Once satisfactory placement of the needle was achieved, the numbing solution was slowly injected after negative aspiration for blood. 2.0 mL of the nerve block solution was injected without difficulty or complication. After waiting for at least 3 minutes, the ablation was performed. Once completed, the needle was removed intact. L5 Medial Branch Nerve RFA: The target area for the L5 medial branch is at the junction of the postero-lateral aspect of the superior articular process of S1 and the superior, posterior, and medial edge of the sacral ala. Under fluoroscopic guidance, a Radiofrequency needle was inserted  until contact was made with os over the superior postero-lateral aspect of the pedicular shadow (target area). Sensory and motor testing was conducted to properly adjust the position of the needle. Once satisfactory placement of the needle was achieved, the numbing solution was slowly injected after negative aspiration for blood. 2.0 mL of the nerve block solution was injected without difficulty or complication. After waiting for at least 3 minutes,  the ablation was performed. Once completed, the needle was removed intact.  Radiofrequency lesioning (ablation):  Radiofrequency Generator: NeuroTherm [id number] Sensory Stimulation Parameters: 50 Hz was used to locate & identify the nerve, making sure that the needle was positioned such that there was no sensory stimulation below 0.3 V or above 0.7 V. Motor Stimulation Parameters: 2 Hz was used to evaluate the motor component. Care was taken not to lesion any nerves that demonstrated motor stimulation of the lower extremities at an output of less than 2.5 times that of the sensory threshold, or a maximum of 2.0 V. Lesioning Technique Parameters: Standard Radiofrequency settings. (Not bipolar or pulsed.) Temperature Settings: 80 degrees C Lesioning time: 60 seconds Intra-operative Compliance: Compliant Materials & Medications: Needle(s) (Electrode/Cannula) Type: Teflon-coated, curved tip, Radiofrequency needle(s) Gauge: 22G Length: 10cm Numbing solution: 6 cc solution made of 5 cc of 0.2% ropivacaine, 1 cc of Decadron 10 mg/cc. 2 cc injected at each level above on the left prior to lesioning, after sensorimotor testing. The unused portion of the solution was discarded in the proper designated containers.  Once the entire procedure was completed, the treated area was cleaned, making sure to leave some of the prepping solution back to take advantage of its long term bactericidal properties.  Illustration of the posterior view of the lumbar spine and the  posterior neural structures. Laminae of L2 through S1 are labeled. DPRL5, dorsal primary ramus of L5; DPRS1, dorsal primary ramus of S1; DPR3, dorsal primary ramus of L3; FJ, facet (zygapophyseal) joint L3-L4; I, inferior articular process of L4; LB1, lateral branch of dorsal primary ramus of L1; IAB, inferior articular branches from L3 medial branch (supplies L4-L5 facet joint); IBP, intermediate branch plexus; MB3, medial branch of dorsal primary ramus of L3; NR3, third lumbar nerve root; S, superior articular process of L5; SAB, superior articular branches from L4 (supplies L4-5 facet joint also); TP3, transverse process of L3.  Vitals:   07/11/19 0943 07/11/19 0953 07/11/19 1000 07/11/19 1014  BP: 106/83 114/81 125/89 122/85  Pulse:      Resp: 16 16 16 16   Temp:  97.8 F (36.6 C)  97.6 F (36.4 C)  SpO2: 94% 100% 100% 100%  Weight:      Height:       Start Time: 0925 hrs. End Time: 0943 hrs.  Imaging Guidance (Spinal):          Type of Imaging Technique: Fluoroscopy Guidance (Spinal) Indication(s): Assistance in needle guidance and placement for procedures requiring needle placement in or near specific anatomical locations not easily accessible without such assistance. Exposure Time: Please see nurses notes. Contrast: None used. Fluoroscopic Guidance: I was personally present during the use of fluoroscopy. "Tunnel Vision Technique" used to obtain the best possible view of the target area. Parallax error corrected before commencing the procedure. "Direction-depth-direction" technique used to introduce the needle under continuous pulsed fluoroscopy. Once target was reached, antero-posterior, oblique, and lateral fluoroscopic projection used confirm needle placement in all planes. Images permanently stored in EMR. Interpretation: No contrast injected. I personally interpreted the imaging intraoperatively. Adequate needle placement confirmed in multiple planes. Permanent images saved into the  patient's record.  Antibiotic Prophylaxis:   Anti-infectives (From admission, onward)   None     Indication(s): None identified  Post-operative Assessment:  Post-procedure Vital Signs:  Pulse/HCG Rate: 7874 Temp: 97.6 F (36.4 C) Resp: 16 BP: 122/85 SpO2: 100 %  EBL: None  Complications: No immediate post-treatment complications observed by team, or reported by patient.  Note: The patient tolerated the entire procedure well. A repeat set of vitals were taken after the procedure and the patient was kept under observation following institutional policy, for this type of procedure. Post-procedural neurological assessment was performed, showing return to baseline, prior to discharge. The patient was provided with post-procedure discharge instructions, including a section on how to identify potential problems. Should any problems arise concerning this procedure, the patient was given instructions to immediately contact us, at any time, without hesitation. In any case, we plan to contact the patient by telephone for a follow-up status report regarding this interventional procedure.  Comments:  No additional relevant information.  Plan of Care  Orders:  Orders Placed This Encounter  Procedures  . DG PAIN CLINIC C-ARM 1-60 MIN NO REPORT    Intraoperative interpretation by procedural physician at [facility].    Standing Status:   Standing    Number of Occurrences:   1    Order Specific Question:   Reason for exam:    Answer:   Assistance in needle guidance and placement for procedures requiring needle placement in or near specific anatomical locations not easily accessible without such assistance.   Medications ordered for procedure: Meds ordered this encounter  Medications  . iohexol (OMNIPAQUE) 180 MG/ML injection 10 mL    Must be Myelogram-compatible. If not available, you may substitute with a water-soluble, non-ionic, hypoallergenic, myelogram-compatible radiological  contrast medium.  Marland Kitchen lidocaine (XYLOCAINE) 2 % (with pres) injection 400 mg  . midazolam (VERSED) 5 MG/5ML injection 1-2 mg    Make sure Flumazenil is available in the pyxis when using this medication. If oversedation occurs, administer 0.2 mg IV over 15 sec. If after 45 sec no response, administer 0.2 mg again over 1 min; may repeat at 1 min intervals; not to exceed 4 doses (1 mg)  . lactated ringers infusion 1,000 mL  . fentaNYL (SUBLIMAZE) injection 25-50 mcg    Make sure Narcan is available in the pyxis when using this medication. In the event of respiratory depression (RR< 8/min): Titrate NARCAN (naloxone) in increments of 0.1 to 0.2 mg IV at 2-3 minute intervals, until desired degree of reversal.  . ropivacaine (PF) 2 mg/mL (0.2%) (NAROPIN) injection 9 mL  . dexamethasone (DECADRON) injection 10 mg   Medications administered: We administered lidocaine, midazolam, fentaNYL, ropivacaine (PF) 2 mg/mL (0.2%), and dexamethasone.  See the medical record for exact dosing, route, and time of administration.  Follow-up plan:   Return for Keep sch. appt, Contra-lateral RFA.      Status post bilateral L3, L4, L5, S1 lumbar facet medial branch nerve blocks on 03/20/2018 helped 85% pain relief for approximately 1 year. Status post left L3, L4, L5 RFA on 07/11/2019.     Recent Visits Date Type Provider Dept  06/26/19 Telemedicine Edward Jolly, MD [facility]  06/21/19 Office Visit Edward Jolly, MD [facility]  04/25/19 Procedure visit Edward Jolly, MD [facility]  04/18/19 Office Visit Edward Jolly, MD [facility]  Showing recent visits within past 90 days and meeting all other requirements Today's Visits Date Type Provider Dept  07/11/19 Procedure visit Edward Jolly, MD [facility]  Showing today's visits and meeting all other requirements Future Appointments Date Type Provider Dept  07/25/19 Appointment Edward Jolly, MD [facility]  Showing future appointments within next 90 days and meeting all other requirements  Disposition: Discharge home  Discharge (Date  Time): 07/11/2019; 1015 hrs.   Primary  Care Physician: [facility] [place] Location: [facility] Note by: [name], MD Date: 07/11/2019; Time: 10:24 AM  Disclaimer:  Medicine is not an exact science. The only guarantee in medicine is that nothing is guaranteed. It is important to note that the decision to proceed with this intervention was based on the information collected from the patient. The Data and conclusions were drawn from the patient's questionnaire, the interview, and the physical examination. Because the information was provided in large part by the patient, it cannot be guaranteed that it has not been purposely or unconsciously manipulated. Every effort has been made to obtain as much relevant data as possible for this evaluation. It is important to note that the conclusions that lead to this procedure are derived in large part from the available data. Always take into account that the treatment will also be dependent on availability of resources and existing treatment guidelines, considered by other Pain Management Practitioners as being common knowledge and practice, at the time of the intervention. For Medico-Legal purposes, it is also important to point out that variation in procedural techniques and pharmacological choices are the acceptable norm. The indications, contraindications, technique, and results of the above procedure should only be interpreted and judged by a Board-Certified Interventional Pain Specialist with extensive familiarity and expertise in the same exact procedure and technique.

## 2019-07-11 NOTE — Progress Notes (Signed)
Safety precautions to be maintained throughout the outpatient stay will include: orient to surroundings, keep bed in low position, maintain call bell within reach at all times, provide assistance with transfer out of bed and ambulation.  

## 2019-07-16 DIAGNOSIS — F411 Generalized anxiety disorder: Secondary | ICD-10-CM | POA: Diagnosis not present

## 2019-07-25 ENCOUNTER — Other Ambulatory Visit: Payer: Self-pay

## 2019-07-25 ENCOUNTER — Ambulatory Visit (HOSPITAL_BASED_OUTPATIENT_CLINIC_OR_DEPARTMENT_OTHER): Payer: Medicaid Other | Admitting: Student in an Organized Health Care Education/Training Program

## 2019-07-25 ENCOUNTER — Encounter: Payer: Self-pay | Admitting: Student in an Organized Health Care Education/Training Program

## 2019-07-25 ENCOUNTER — Ambulatory Visit
Admission: RE | Admit: 2019-07-25 | Discharge: 2019-07-25 | Disposition: A | Discharge status: Home / Self Care | Payer: Medicaid Other | Source: Ambulatory Visit | Attending: Student in an Organized Health Care Education/Training Program | Admitting: Student in an Organized Health Care Education/Training Program

## 2019-07-25 VITALS — BP 127/89 | HR 73 | Temp 97.3°F | Resp 18 | Ht 67.0 in | Wt 175.0 lb

## 2019-07-25 DIAGNOSIS — M47816 Spondylosis without myelopathy or radiculopathy, lumbar region: Secondary | ICD-10-CM | POA: Diagnosis not present

## 2019-07-25 MED ORDER — DEXAMETHASONE SODIUM PHOSPHATE 10 MG/ML IJ SOLN
INTRAMUSCULAR | Status: AC
  Filled 2019-07-25: qty 1

## 2019-07-25 MED ORDER — DEXAMETHASONE SODIUM PHOSPHATE 10 MG/ML IJ SOLN
10.0000 mg | Freq: Once | INTRAMUSCULAR | Status: AC
  Administered 2019-07-25: 10 mg

## 2019-07-25 MED ORDER — FENTANYL CITRATE (PF) 100 MCG/2ML IJ SOLN
25.0000 ug | INTRAMUSCULAR | Status: DC | PRN
  Administered 2019-07-25: 100 ug via INTRAVENOUS

## 2019-07-25 MED ORDER — ROPIVACAINE HCL 2 MG/ML IJ SOLN
9.0000 mL | Freq: Once | INTRAMUSCULAR | Status: AC
  Administered 2019-07-25: 10 mL via PERINEURAL

## 2019-07-25 MED ORDER — LIDOCAINE HCL 2 % IJ SOLN
20.0000 mL | Freq: Once | INTRAMUSCULAR | Status: AC
  Administered 2019-07-25: 200 mg

## 2019-07-25 MED ORDER — ROPIVACAINE HCL 2 MG/ML IJ SOLN
INTRAMUSCULAR | Status: AC
  Filled 2019-07-25: qty 10

## 2019-07-25 MED ORDER — FENTANYL CITRATE (PF) 100 MCG/2ML IJ SOLN
INTRAMUSCULAR | Status: AC
  Filled 2019-07-25: qty 2

## 2019-07-25 MED ORDER — LIDOCAINE HCL 2 % IJ SOLN
INTRAMUSCULAR | Status: AC
  Filled 2019-07-25: qty 10

## 2019-07-25 MED ORDER — TIZANIDINE HCL 4 MG PO TABS: 4.0000 mg | ORAL_TABLET | Freq: Two times a day (BID) | ORAL | 0 refills | Status: AC | PRN

## 2019-07-25 NOTE — Progress Notes (Signed)
Safety precautions to be maintained throughout the outpatient stay will include: orient to surroundings, keep bed in low position, maintain call bell within reach at all times, provide assistance with transfer out of bed and ambulation.  

## 2019-07-25 NOTE — Patient Instructions (Addendum)
Stop Flexeril.  New prescription for tizanidine which is a muscle relaxer that she can utilize over the next week for spasms in her low back.  Please do not take this medication with Flexeril.____________________________________________________________________________________________  Post-Procedure Discharge Instructions  Instructions:  Apply ice:   Purpose: This will minimize any swelling and discomfort after procedure.   When: Day of procedure, as soon as you get home.  How: Fill a plastic sandwich bag with crushed ice. Cover it with a Settles towel and apply to injection site.  How long: (15 min on, 15 min off) Apply for 15 minutes then remove x 15 minutes.  Repeat sequence on day of procedure, until you go to bed.  Apply heat:   Purpose: To treat any soreness and discomfort from the procedure.  When: Starting the next day after the procedure.  How: Apply heat to procedure site starting the day following the procedure.  How long: May continue to repeat daily, until discomfort goes away.  Food intake: Start with clear liquids (like water) and advance to regular food, as tolerated.   Physical activities: Keep activities to a minimum for the first 8 hours after the procedure. After that, then as tolerated.  Driving: If you have received any sedation, be responsible and do not drive. You are not allowed to drive for 24 hours after having sedation.  Blood thinner: (Applies only to those taking blood thinners) You may restart your blood thinner 6 hours after your procedure.  Insulin: (Applies only to Diabetic patients taking insulin) As soon as you can eat, you may resume your normal dosing schedule.  Infection prevention: Keep procedure site clean and dry. Shower daily and clean area with soap and water.  Post-procedure Pain Diary: Extremely important that this be done correctly and accurately. Recorded information will be used to determine the next step in treatment. For the  purpose of accuracy, follow these rules:  Evaluate only the area treated. Do not report or include pain from an untreated area. For the purpose of this evaluation, ignore all other areas of pain, except for the treated area.  After your procedure, avoid taking a long nap and attempting to complete the pain diary after you wake up. Instead, set your alarm clock to go off every hour, on the hour, for the initial 8 hours after the procedure. Document the duration of the numbing medicine, and the relief you are getting from it.  Do not go to sleep and attempt to complete it later. It will not be accurate. If you received sedation, it is likely that you were given a medication that may cause amnesia. Because of this, completing the diary at a later time may cause the information to be inaccurate. This information is needed to plan your care.  Follow-up appointment: Keep your post-procedure follow-up evaluation appointment after the procedure (usually 2 weeks for most procedures, 6 weeks for radiofrequencies). DO NOT FORGET to bring you pain diary with you.   Expect: (What should I expect to see with my procedure?)  From numbing medicine (AKA: Local Anesthetics): Numbness or decrease in pain. You may also experience some weakness, which if present, could last for the duration of the local anesthetic.  Onset: Full effect within 15 minutes of injected.  Duration: It will depend on the type of local anesthetic used. On the average, 1 to 8 hours.   From steroids (Applies only if steroids were used): Decrease in swelling or inflammation. Once inflammation is improved, relief of the pain  will follow.  Onset of benefits: Depends on the amount of swelling present. The more swelling, the longer it will take for the benefits to be seen. In some cases, up to 10 days.  Duration: Steroids will stay in the system x 2 weeks. Duration of benefits will depend on multiple posibilities including persistent irritating  factors.  Side-effects: If present, they may typically last 2 weeks (the duration of the steroids).  Frequent: Cramps (if they occur, drink Gatorade and take over-the-counter Magnesium 450-500 mg once to twice a day); water retention with temporary weight gain; increases in blood sugar; decreased immune system response; increased appetite.  Occasional: Facial flushing (red, warm cheeks); mood swings; menstrual changes.  Uncommon: Long-term decrease or suppression of natural hormones; bone thinning. (These are more common with higher doses or more frequent use. This is why we prefer that our patients avoid having any injection therapies in other practices.)   Very Rare: Severe mood changes; psychosis; aseptic necrosis.  From procedure: Some discomfort is to be expected once the numbing medicine wears off. This should be minimal if ice and heat are applied as instructed.  Call if: (When should I call?)  You experience numbness and weakness that gets worse with time, as opposed to wearing off.  New onset bowel or bladder incontinence. (Applies only to procedures done in the spine)  Emergency Numbers:  Durning business hours (Monday - Thursday, 8:00 AM - 4:00 PM) (Friday, 9:00 AM - 12:00 Noon): [phone]  After hours: [phone]  NOTE: If you are having a problem and are unable connect with, or to talk to a provider, then go to your nearest urgent care or emergency department. If the problem is serious and urgent, please call 911. ____________________________________________________________________________________________

## 2019-07-25 NOTE — Progress Notes (Signed)
PROVIDER NOTE: Information contained herein reflects review and annotations entered in association with encounter. Interpretation of such information and data should be left to medically-trained personnel. Information provided to patient can be located elsewhere in the medical record under "Patient Instructions". Document created using STT-dictation technology, any transcriptional errors that may result from process are unintentional.    Patient: Ryan [name]  Service Category: Procedure  Provider: Edward Jolly, MD  DOB: August 10, 1955  DOS: 07/25/2019  Location: [facility]  MRN: [id number]  Setting: Ambulatory - outpatient  Referring Provider: [facility]  Type: Established Patient  Specialty: Interventional Pain Management  PCP: [facility]   Primary Reason for Visit: Interventional Pain Management Treatment. CC: Back Pain (low right)  Procedure:          Anesthesia, Analgesia, Anxiolysis:  Type: Thermal Lumbar Facet, Medial Branch Radiofrequency Ablation/Neurotomy  #1  Primary Purpose: Therapeutic Region: Posterolateral Lumbosacral Spine Level: L3, L4, L5,  Medial Branch Level(s). These levels will denervate the L3-4, L4-5, lumbar facet joints. Laterality: Right  Type: Moderate (Conscious) Sedation combined with Local Anesthesia Indication(s): Analgesia and Anxiety Route: Intravenous (IV) IV Access: Secured Sedation: Meaningful verbal contact was maintained at all times during the procedure  Local Anesthetic: Lidocaine 1-2%  Position: Prone   Indications: 1. Lumbar spondylosis   2. Lumbar facet arthropathy    Ryan [name] has been dealing with the above chronic pain for longer than three months and has either failed to respond, was unable to tolerate, or simply did not get enough benefit from other more conservative therapies including, but not limited to: 1. Over-the-counter medications 2. Anti-inflammatory medications 3. Muscle  relaxants 4. Membrane stabilizers 5. Opioids 6. Physical therapy and/or chiropractic manipulation 7. Modalities (Heat, ice, etc.) 8. Invasive techniques such as nerve blocks. Ryan [name] has attained more than 50% relief of the pain from a series of diagnostic injections conducted in separate occasions.  Pain Score: Pre-procedure: 7 /10 Post-procedure: 0-No pain/10  Pre-op Assessment:  Ryan [name] is a 64 y.o. (year old), male patient, seen today for interventional treatment. He  has a past surgical history that includes Eye surgery; Hernia repair; Transforaminal lumbar interbody fusion (tlif) with pedicle screw fixation 1 level (Right, 06/18/2014); Myringotomy with tube placement (Bilateral, 03/28/2015); Back surgery; Esophagogastroduodenoscopy (egd) with propofol (N/A, 10/24/2015); Cataract extraction w/PHACO (Left, 01/06/2016); Tonsillectomy; Colonoscopy with propofol (N/A, 04/19/2016); and Sinusotomy (02/28/2019). Ryan [name] has a current medication list which includes the following prescription(s): acetaminophen, albuterol, alprazolam, cetirizine, fluticasone, gabapentin, lisinopril, multivitamin, omeprazole, sildenafil, tamsulosin, and tizanidine, and the following Facility-Administered Medications: fentanyl. His primarily concern today is the Back Pain (low right)  Initial Vital Signs:  Pulse/HCG Rate: 73ECG Heart Rate: 69 Temp: (!) 97.3 F (36.3 C) Resp: 18 BP: 125/83 SpO2: 97 %  BMI: Estimated body mass index is 27.41 kg/m as calculated from the following:   Height as of this encounter:  (1.702 m).   Weight as of this encounter: 175 lb (79.4 kg).  Risk Assessment: Allergies: Reviewed. He has No Known Allergies.  Allergy Precautions: None required Coagulopathies: Reviewed. None identified.  Blood-thinner therapy: None at this time Active Infection(s): Reviewed. None identified. Ryan [name] is afebrile  Site Confirmation: Ryan [name] was asked to confirm the procedure and laterality  before marking the site Procedure checklist: Completed Consent: Before the procedure and under the influence of no sedative(s), amnesic(s), or anxiolytics, the patient was informed of the treatment options, risks and possible complications. To fulfill  our ethical and legal obligations, as recommended by the American Medical Association's Code of Ethics, I have informed the patient of my clinical impression; the nature and purpose of the treatment or procedure; the risks, benefits, and possible complications of the intervention; the alternatives, including doing nothing; the risk(s) and benefit(s) of the alternative treatment(s) or procedure(s); and the risk(s) and benefit(s) of doing nothing. The patient was provided information about the general risks and possible complications associated with the procedure. These may include, but are not limited to: failure to achieve desired goals, infection, bleeding, organ or nerve damage, allergic reactions, paralysis, and death. In addition, the patient was informed of those risks and complications associated to Spine-related procedures, such as failure to decrease pain; infection (i.e.: Meningitis, epidural or intraspinal abscess); bleeding (i.e.: epidural hematoma, subarachnoid hemorrhage, or any other type of intraspinal or peri-dural bleeding); organ or nerve damage (i.e.: Any type of peripheral nerve, nerve root, or spinal cord injury) with subsequent damage to sensory, motor, and/or autonomic systems, resulting in permanent pain, numbness, and/or weakness of one or several areas of the body; allergic reactions; (i.e.: anaphylactic reaction); and/or death. Furthermore, the patient was informed of those risks and complications associated with the medications. These include, but are not limited to: allergic reactions (i.e.: anaphylactic or anaphylactoid reaction(s)); adrenal axis suppression; blood sugar elevation that in diabetics may result in ketoacidosis or comma;  water retention that in patients with history of congestive heart failure may result in shortness of breath, pulmonary edema, and decompensation with resultant heart failure; weight gain; swelling or edema; medication-induced neural toxicity; particulate matter embolism and blood vessel occlusion with resultant organ, and/or nervous system infarction; and/or aseptic necrosis of one or more joints. Finally, the patient was informed that Medicine is not an exact science; therefore, there is also the possibility of unforeseen or unpredictable risks and/or possible complications that may result in a catastrophic outcome. The patient indicated having understood very clearly. We have given the patient no guarantees and we have made no promises. Enough time was given to the patient to ask questions, all of which were answered to the patient's satisfaction. Ryan [name] has indicated that he wanted to continue with the procedure. Attestation: I, the ordering provider, attest that I have discussed with the patient the benefits, risks, side-effects, alternatives, likelihood of achieving goals, and potential problems during recovery for the procedure that I have provided informed consent. Date  Time: 07/25/2019  7:40 AM  Pre-Procedure Preparation:  Monitoring: As per clinic protocol. Respiration, ETCO2, SpO2, BP, heart rate and rhythm monitor placed and checked for adequate function Safety Precautions: Patient was assessed for positional comfort and pressure points before starting the procedure. Time-out: I initiated and conducted the "Time-out" before starting the procedure, as per protocol. The patient was asked to participate by confirming the accuracy of the "Time Out" information. Verification of the correct person, site, and procedure were performed and confirmed by me, the nursing staff, and the patient. "Time-out" conducted as per Joint Commission's Universal Protocol (UP.01.01.01). Time: 0829  Description of  Procedure:          Laterality: Right Levels:   L3, L4, L5,  Medial Branch Level(s), at the L3-4, L4-5,  lumbar facet joints. Area Prepped: Lumbosacral DuraPrep (Iodine Povacrylex [0.7% available iodine] and Isopropyl Alcohol, 74% w/w) Safety Precautions: Aspiration looking for blood return was conducted prior to all injections. At no point did we inject any substances, as a needle was being advanced. Before injecting, the patient was  told to immediately notify me if he was experiencing any new onset of "ringing in the ears, or metallic taste in the mouth". No attempts were made at seeking any paresthesias. Safe injection practices and needle disposal techniques used. Medications properly checked for expiration dates. SDV (single dose vial) medications used. After the completion of the procedure, all disposable equipment used was discarded in the proper designated medical waste containers. Local Anesthesia: Protocol guidelines were followed. The patient was positioned over the fluoroscopy table. The area was prepped in the usual manner. The time-out was completed. The target area was identified using fluoroscopy. A 12-in long, straight, sterile hemostat was used with fluoroscopic guidance to locate the targets for each level blocked. Once located, the skin was marked with an approved surgical skin marker. Once all sites were marked, the skin (epidermis, dermis, and hypodermis), as well as deeper tissues (fat, connective tissue and muscle) were infiltrated with a Dunkel amount of a short-acting local anesthetic, loaded on a 10cc syringe with a 25G, 1.5-in  Needle. An appropriate amount of time was allowed for local anesthetics to take effect before proceeding to the next step. Local Anesthetic: Lidocaine 2.0% The unused portion of the local anesthetic was discarded in the proper designated containers. Technical explanation of process:  Radiofrequency Ablation (RFA) L3 Medial Branch Nerve RFA: The target area  for the L3 medial branch is at the junction of the postero-lateral aspect of the superior articular process and the superior, posterior, and medial edge of the transverse process of L4. Under fluoroscopic guidance, a Radiofrequency needle was inserted until contact was made with os over the superior postero-lateral aspect of the pedicular shadow (target area). Sensory and motor testing was conducted to properly adjust the position of the needle. Once satisfactory placement of the needle was achieved, the numbing solution was slowly injected after negative aspiration for blood. 2.0 mL of the nerve block solution was injected without difficulty or complication. After waiting for at least 3 minutes, the ablation was performed. Once completed, the needle was removed intact. L4 Medial Branch Nerve RFA: The target area for the L4 medial branch is at the junction of the postero-lateral aspect of the superior articular process and the superior, posterior, and medial edge of the transverse process of L5. Under fluoroscopic guidance, a Radiofrequency needle was inserted until contact was made with os over the superior postero-lateral aspect of the pedicular shadow (target area). Sensory and motor testing was conducted to properly adjust the position of the needle. Once satisfactory placement of the needle was achieved, the numbing solution was slowly injected after negative aspiration for blood. 2.0 mL of the nerve block solution was injected without difficulty or complication. After waiting for at least 3 minutes, the ablation was performed. Once completed, the needle was removed intact. L5 Medial Branch Nerve RFA: The target area for the L5 medial branch is at the junction of the postero-lateral aspect of the superior articular process of S1 and the superior, posterior, and medial edge of the sacral ala. Under fluoroscopic guidance, a Radiofrequency needle was inserted until contact was made with os over the superior  postero-lateral aspect of the pedicular shadow (target area). Sensory and motor testing was conducted to properly adjust the position of the needle. Once satisfactory placement of the needle was achieved, the numbing solution was slowly injected after negative aspiration for blood. 2.0 mL of the nerve block solution was injected without difficulty or complication. After waiting for at least 3 minutes, the ablation was  performed. Once completed, the needle was removed intact.  Radiofrequency lesioning (ablation):  Radiofrequency Generator: NeuroTherm [id number] Sensory Stimulation Parameters: 50 Hz was used to locate & identify the nerve, making sure that the needle was positioned such that there was no sensory stimulation below 0.3 V or above 0.7 V. Motor Stimulation Parameters: 2 Hz was used to evaluate the motor component. Care was taken not to lesion any nerves that demonstrated motor stimulation of the lower extremities at an output of less than 2.5 times that of the sensory threshold, or a maximum of 2.0 V. Lesioning Technique Parameters: Standard Radiofrequency settings. (Not bipolar or pulsed.) Temperature Settings: 80 degrees C Lesioning time: 60 seconds Intra-operative Compliance: Compliant Materials & Medications: Needle(s) (Electrode/Cannula) Type: Teflon-coated, curved tip, Radiofrequency needle(s) Gauge: 22G Length: 10cm Numbing solution: 6 cc solution made of 5 cc of 0.2% ropivacaine, 1 cc of Decadron 10 mg/cc. 2 cc injected at each level above on the left prior to lesioning, after sensorimotor testing. The unused portion of the solution was discarded in the proper designated containers.  Once the entire procedure was completed, the treated area was cleaned, making sure to leave some of the prepping solution back to take advantage of its long term bactericidal properties.  Illustration of the posterior view of the lumbar spine and the posterior neural structures. Laminae of L2 through S1  are labeled. DPRL5, dorsal primary ramus of L5; DPRS1, dorsal primary ramus of S1; DPR3, dorsal primary ramus of L3; FJ, facet (zygapophyseal) joint L3-L4; I, inferior articular process of L4; LB1, lateral branch of dorsal primary ramus of L1; IAB, inferior articular branches from L3 medial branch (supplies L4-L5 facet joint); IBP, intermediate branch plexus; MB3, medial branch of dorsal primary ramus of L3; NR3, third lumbar nerve root; S, superior articular process of L5; SAB, superior articular branches from L4 (supplies L4-5 facet joint also); TP3, transverse process of L3.  Vitals:   07/25/19 0855 07/25/19 0905 07/25/19 0915 07/25/19 0925  BP: 129/84 (!) 143/90 130/86 127/89  Pulse:      Resp: Temp:  (!) 97.4 F (36.3 C)  (!) 97.3 F (36.3 C)  SpO2: 96% 100% 100% 100%  Weight:      Height:       Start Time: 0829 hrs. End Time: 0855 hrs.  Imaging Guidance (Spinal):          Type of Imaging Technique: Fluoroscopy Guidance (Spinal) Indication(s): Assistance in needle guidance and placement for procedures requiring needle placement in or near specific anatomical locations not easily accessible without such assistance. Exposure Time: Please see nurses notes. Contrast: None used. Fluoroscopic Guidance: I was personally present during the use of fluoroscopy. "Tunnel Vision Technique" used to obtain the best possible view of the target area. Parallax error corrected before commencing the procedure. "Direction-depth-direction" technique used to introduce the needle under continuous pulsed fluoroscopy. Once target was reached, antero-posterior, oblique, and lateral fluoroscopic projection used confirm needle placement in all planes. Images permanently stored in EMR. Interpretation: No contrast injected. I personally interpreted the imaging intraoperatively. Adequate needle placement confirmed in multiple planes. Permanent images saved into the patient's record.  Antibiotic  Prophylaxis:   Anti-infectives (From admission, onward)   None     Indication(s): None identified  Post-operative Assessment:  Post-procedure Vital Signs:  Pulse/HCG Rate: 7368 Temp: (!) 97.3 F (36.3 C) Resp: 18 BP: 127/89 SpO2: 100 %  EBL: None  Complications: No immediate post-treatment complications observed by team, or reported by  patient.  Note: The patient tolerated the entire procedure well. A repeat set of vitals were taken after the procedure and the patient was kept under observation following institutional policy, for this type of procedure. Post-procedural neurological assessment was performed, showing return to baseline, prior to discharge. The patient was provided with post-procedure discharge instructions, including a section on how to identify potential problems. Should any problems arise concerning this procedure, the patient was given instructions to immediately contact us, at any time, without hesitation. In any case, we plan to contact the patient by telephone for a follow-up status report regarding this interventional procedure.  Comments:  No additional relevant information.  5 out of 5 strength bilateral lower extremity: Plantar flexion, dorsiflexion, knee flexion, knee extension.   Plan of Care  Orders:  Orders Placed This Encounter  Procedures  . DG PAIN CLINIC C-ARM 1-60 MIN NO REPORT    Intraoperative interpretation by procedural physician at [facility].    Standing Status:   Standing    Number of Occurrences:   1    Order Specific Question:   Reason for exam:    Answer:   Assistance in needle guidance and placement for procedures requiring needle placement in or near specific anatomical locations not easily accessible without such assistance.   Medications ordered for procedure: Meds ordered this encounter  Medications  . lidocaine (XYLOCAINE) 2 % (with pres) injection 400 mg  . fentaNYL (SUBLIMAZE) injection 25-50 mcg    Make sure  Narcan is available in the pyxis when using this medication. In the event of respiratory depression (RR< 8/min): Titrate NARCAN (naloxone) in increments of 0.1 to 0.2 mg IV at 2-3 minute intervals, until desired degree of reversal.  . ropivacaine (PF) 2 mg/mL (0.2%) (NAROPIN) injection 9 mL  . dexamethasone (DECADRON) injection 10 mg  . tiZANidine (ZANAFLEX) 4 MG tablet    Sig: Take 1 tablet (4 mg total) by mouth 2 (two) times daily as needed for muscle spasms.    Dispense:  60 tablet    Refill:  0    Do not place this medication, or any other prescription from our practice, on "Automatic Refill". Patient may have prescription filled one day early if pharmacy is closed on scheduled refill date.   Medications administered: We administered lidocaine, fentaNYL, ropivacaine (PF) 2 mg/mL (0.2%), and dexamethasone.  See the medical record for exact dosing, route, and time of administration.  Follow-up plan:   Return in about 10 weeks (around 10/03/2019) for Post Procedure Evaluation, virtual.      Status post bilateral L3, L4, L5, S1 lumbar facet medial branch nerve blocks on 03/20/2018 helped 85% pain relief for approximately 1 year. Status post left L3, L4, L5 RFA on 07/11/2019, Right L3, L4, L5 RFA 07/25/19.     Recent Visits Date Type Provider Dept  07/11/19 Procedure visit Edward Jolly, MD [facility]  06/26/19 Telemedicine Edward Jolly, MD [facility]  06/21/19 Office Visit Edward Jolly, MD [facility]  Showing recent visits within past 90 days and meeting all other requirements Today's Visits Date Type Provider Dept  07/25/19 Procedure visit Edward Jolly, MD [facility]  Showing today's visits and meeting all other requirements Future Appointments No visits were found meeting these conditions. Showing future appointments within next 90 days and meeting all other requirements  Disposition: Discharge home  Discharge (Date  Time): 07/25/2019; 0926  hrs.   Primary Care Physician: [facility] Location[facility] Note by: Edward JollyBilal Omauri Boeve, MD Date: 07/25/2019; Time: 9:44 AM  Disclaimer:  Medicine is not an exact science. The only guarantee in medicine is that nothing is guaranteed. It is important to note that the decision to proceed with this intervention was based on the information collected from the patient. The Data and conclusions were drawn from the patient's questionnaire, the interview, and the physical examination. Because the information was provided in large part by the patient, it cannot be guaranteed that it has not been purposely or unconsciously manipulated. Every effort has been made to obtain as much relevant data as possible for this evaluation. It is important to note that the conclusions that lead to this procedure are derived in large part from the available data. Always take into account that the treatment will also be dependent on availability of resources and existing treatment guidelines, considered by other Pain Management Practitioners as being common knowledge and practice, at the time of the intervention. For Medico-Legal purposes, it is also important to point out that variation in procedural techniques and pharmacological choices are the acceptable norm. The indications, contraindications, technique, and results of the above procedure should only be interpreted and judged by a Board-Certified Interventional Pain Specialist with extensive familiarity and expertise in the same exact procedure and technique.

## 2019-08-08 DIAGNOSIS — F341 Dysthymic disorder: Secondary | ICD-10-CM | POA: Diagnosis not present

## 2019-08-08 DIAGNOSIS — F411 Generalized anxiety disorder: Secondary | ICD-10-CM | POA: Diagnosis not present

## 2019-08-13 ENCOUNTER — Ambulatory Visit: Payer: Medicaid Other | Admitting: Student in an Organized Health Care Education/Training Program

## 2019-08-15 ENCOUNTER — Ambulatory Visit
Payer: Medicaid Other | Attending: Student in an Organized Health Care Education/Training Program | Admitting: Student in an Organized Health Care Education/Training Program

## 2019-08-15 ENCOUNTER — Other Ambulatory Visit: Payer: Self-pay

## 2019-08-15 ENCOUNTER — Encounter: Payer: Self-pay | Admitting: Student in an Organized Health Care Education/Training Program

## 2019-08-15 VITALS — BP 136/97 | HR 84 | Temp 97.5°F | Resp 18 | Ht 67.0 in | Wt 175.0 lb

## 2019-08-15 DIAGNOSIS — G894 Chronic pain syndrome: Secondary | ICD-10-CM | POA: Insufficient documentation

## 2019-08-15 DIAGNOSIS — M48062 Spinal stenosis, lumbar region with neurogenic claudication: Secondary | ICD-10-CM | POA: Insufficient documentation

## 2019-08-15 DIAGNOSIS — M47816 Spondylosis without myelopathy or radiculopathy, lumbar region: Secondary | ICD-10-CM | POA: Diagnosis not present

## 2019-08-15 DIAGNOSIS — M961 Postlaminectomy syndrome, not elsewhere classified: Secondary | ICD-10-CM | POA: Diagnosis not present

## 2019-08-15 MED ORDER — ORPHENADRINE CITRATE 30 MG/ML IJ SOLN
INTRAMUSCULAR | Status: AC
  Filled 2019-08-15: qty 2

## 2019-08-15 MED ORDER — ORPHENADRINE CITRATE 30 MG/ML IJ SOLN
30.0000 mg | Freq: Once | INTRAMUSCULAR | Status: AC
  Administered 2019-08-15: 30 mg via INTRAMUSCULAR

## 2019-08-15 MED ORDER — KETOROLAC TROMETHAMINE 30 MG/ML IJ SOLN
30.0000 mg | Freq: Once | INTRAMUSCULAR | Status: AC
  Administered 2019-08-15: 30 mg via INTRAMUSCULAR

## 2019-08-15 MED ORDER — KETOROLAC TROMETHAMINE 30 MG/ML IJ SOLN
INTRAMUSCULAR | Status: AC
  Filled 2019-08-15: qty 1

## 2019-08-15 NOTE — Progress Notes (Signed)
Safety precautions to be maintained throughout the outpatient stay will include: orient to surroundings, keep bed in low position, maintain call bell within reach at all times, provide assistance with transfer out of bed and ambulation.  

## 2019-08-15 NOTE — Patient Instructions (Signed)
GENERAL RISKS AND COMPLICATIONS  What are the risk, side effects and possible complications? Generally speaking, most procedures are safe.  However, with any procedure there are risks, side effects, and the possibility of complications.  The risks and complications are dependent upon the sites that are lesioned, or the type of nerve block to be performed.  The closer the procedure is to the spine, the more serious the risks are.  Great care is taken when placing the radio frequency needles, block needles or lesioning probes, but sometimes complications can occur. 1. Infection: Any time there is an injection through the skin, there is a risk of infection.  This is why sterile conditions are used for these blocks.  There are four possible types of infection. 1. Localized skin infection. 2. Central Nervous System Infection-This can be in the form of Meningitis, which can be deadly. 3. Epidural Infections-This can be in the form of an epidural abscess, which can cause pressure inside of the spine, causing compression of the spinal cord with subsequent paralysis. This would require an emergency surgery to decompress, and there are no guarantees that the patient would recover from the paralysis. 4. Discitis-This is an infection of the intervertebral discs.  It occurs in about 1% of discography procedures.  It is difficult to treat and it may lead to surgery.        2. Pain: the needles have to go through skin and soft tissues, will cause soreness.       3. Damage to internal structures:  The nerves to be lesioned may be near blood vessels or    other nerves which can be potentially damaged.       4. Bleeding: Bleeding is more common if the patient is taking blood thinners such as  aspirin, Coumadin, Ticiid, Plavix, etc., or if he/she have some genetic predisposition  such as hemophilia. Bleeding into the spinal canal can cause compression of the spinal  cord with subsequent paralysis.  This would require an  emergency surgery to  decompress and there are no guarantees that the patient would recover from the  paralysis.       5. Pneumothorax:  Puncturing of a lung is a possibility, every time a needle is introduced in  the area of the chest or upper back.  Pneumothorax refers to free air around the  collapsed lung(s), inside of the thoracic cavity (chest cavity).  Another two possible  complications related to a similar event would include: Hemothorax and Chylothorax.   These are variations of the Pneumothorax, where instead of air around the collapsed  lung(s), you may have blood or chyle, respectively.       6. Spinal headaches: They may occur with any procedures in the area of the spine.       7. Persistent CSF (Cerebro-Spinal Fluid) leakage: This is a rare problem, but may occur  with prolonged intrathecal or epidural catheters either due to the formation of a fistulous  track or a dural tear.       8. Nerve damage: By working so close to the spinal cord, there is always a possibility of  nerve damage, which could be as serious as a permanent spinal cord injury with  paralysis.       9. Death:  Although rare, severe deadly allergic reactions known as "Anaphylactic  reaction" can occur to any of the medications used.      10. Worsening of the symptoms:  We can always make thing worse.    What are the chances of something like this happening? Chances of any of this occuring are extremely low.  By statistics, you have more of a chance of getting killed in a motor vehicle accident: while driving to the hospital than any of the above occurring .  Nevertheless, you should be aware that they are possibilities.  In general, it is similar to taking a shower.  Everybody knows that you can slip, hit your head and get killed.  Does that mean that you should not shower again?  Nevertheless always keep in mind that statistics do not mean anything if you happen to be on the wrong side of them.  Even if a procedure has a 1  (one) in a 1,000,000 (million) chance of going wrong, it you happen to be that one..Also, keep in mind that by statistics, you have more of a chance of having something go wrong when taking medications.  Who should not have this procedure? If you are on a blood thinning medication (e.g. Coumadin, Plavix, see list of "Blood Thinners"), or if you have an active infection going on, you should not have the procedure.  If you are taking any blood thinners, please inform your physician.  How should I prepare for this procedure?  Do not eat or drink anything at least six hours prior to the procedure.  Bring a driver with you .  It cannot be a taxi.  Come accompanied by an adult that can drive you back, and that is strong enough to help you if your legs get weak or numb from the local anesthetic.  Take all of your medicines the morning of the procedure with just enough water to swallow them.  If you have diabetes, make sure that you are scheduled to have your procedure done first thing in the morning, whenever possible.  If you have diabetes, take only half of your insulin dose and notify our nurse that you have done so as soon as you arrive at the clinic.  If you are diabetic, but only take blood sugar pills (oral hypoglycemic), then do not take them on the morning of your procedure.  You may take them after you have had the procedure.  Do not take aspirin or any aspirin-containing medications, at least eleven (11) days prior to the procedure.  They may prolong bleeding.  Wear loose fitting clothing that may be easy to take off and that you would not mind if it got stained with Betadine or blood.  Do not wear any jewelry or perfume  Remove any nail coloring.  It will interfere with some of our monitoring equipment.  NOTE: Remember that this is not meant to be interpreted as a complete list of all possible complications.  Unforeseen problems may occur.  BLOOD THINNERS The following drugs  contain aspirin or other products, which can cause increased bleeding during surgery and should not be taken for 2 weeks prior to and 1 week after surgery.  If you should need take something for relief of minor pain, you may take acetaminophen which is found in Tylenol,m Datril, Anacin-3 and Panadol. It is not blood thinner. The products listed below are.  Do not take any of the products listed below in addition to any listed on your instruction sheet.  A.P.C or A.P.C with Codeine Codeine Phosphate Capsules #3 Ibuprofen Ridaura  ABC compound Congesprin Imuran rimadil  Advil Cope Indocin Robaxisal  Alka-Seltzer Effervescent Pain Reliever and Antacid Coricidin or Coricidin-D  Indomethacin Rufen    Alka-Seltzer plus Cold Medicine Cosprin Ketoprofen S-A-C Tablets  Anacin Analgesic Tablets or Capsules Coumadin Korlgesic Salflex  Anacin Extra Strength Analgesic tablets or capsules CP-2 Tablets Lanoril Salicylate  Anaprox Cuprimine Capsules Levenox Salocol  Anexsia-D Dalteparin Magan Salsalate  Anodynos Darvon compound Magnesium Salicylate Sine-off  Ansaid Dasin Capsules Magsal Sodium Salicylate  Anturane Depen Capsules Marnal Soma  APF Arthritis pain formula Dewitt's Pills Measurin Stanback  Argesic Dia-Gesic Meclofenamic Sulfinpyrazone  Arthritis Bayer Timed Release Aspirin Diclofenac Meclomen Sulindac  Arthritis pain formula Anacin Dicumarol Medipren Supac  Analgesic (Safety coated) Arthralgen Diffunasal Mefanamic Suprofen  Arthritis Strength Bufferin Dihydrocodeine Mepro Compound Suprol  Arthropan liquid Dopirydamole Methcarbomol with Aspirin Synalgos  ASA tablets/Enseals Disalcid Micrainin Tagament  Ascriptin Doan's Midol Talwin  Ascriptin A/D Dolene Mobidin Tanderil  Ascriptin Extra Strength Dolobid Moblgesic Ticlid  Ascriptin with Codeine Doloprin or Doloprin with Codeine Momentum Tolectin  Asperbuf Duoprin Mono-gesic Trendar  Aspergum Duradyne Motrin or Motrin IB Triminicin  Aspirin  plain, buffered or enteric coated Durasal Myochrisine Trigesic  Aspirin Suppositories Easprin Nalfon Trillsate  Aspirin with Codeine Ecotrin Regular or Extra Strength Naprosyn Uracel  Atromid-S Efficin Naproxen Ursinus  Auranofin Capsules Elmiron Neocylate Vanquish  Axotal Emagrin Norgesic Verin  Azathioprine Empirin or Empirin with Codeine Normiflo Vitamin E  Azolid Emprazil Nuprin Voltaren  Bayer Aspirin plain, buffered or children's or timed BC Tablets or powders Encaprin Orgaran Warfarin Sodium  Buff-a-Comp Enoxaparin Orudis Zorpin  Buff-a-Comp with Codeine Equegesic Os-Cal-Gesic   Buffaprin Excedrin plain, buffered or Extra Strength Oxalid   Bufferin Arthritis Strength Feldene Oxphenbutazone   Bufferin plain or Extra Strength Feldene Capsules Oxycodone with Aspirin   Bufferin with Codeine Fenoprofen Fenoprofen Pabalate or Pabalate-SF   Buffets II Flogesic Panagesic   Buffinol plain or Extra Strength Florinal or Florinal with Codeine Panwarfarin   Buf-Tabs Flurbiprofen Penicillamine   Butalbital Compound Four-way cold tablets Penicillin   Butazolidin Fragmin Pepto-Bismol   Carbenicillin Geminisyn Percodan   Carna Arthritis Reliever Geopen Persantine   Carprofen Gold's salt Persistin   Chloramphenicol Goody's Phenylbutazone   Chloromycetin Haltrain Piroxlcam   Clmetidine heparin Plaquenil   Cllnoril Hyco-pap Ponstel   Clofibrate Hydroxy chloroquine Propoxyphen         Before stopping any of these medications, be sure to consult the physician who ordered them.  Some, such as Coumadin (Warfarin) are ordered to prevent or treat serious conditions such as "deep thrombosis", "pumonary embolisms", and other heart problems.  The amount of time that you may need off of the medication may also vary with the medication and the reason for which you were taking it.  If you are taking any of these medications, please make sure you notify your pain physician before you undergo any  procedures.         Moderate Conscious Sedation, Adult Sedation is the use of medicines to promote relaxation and relieve discomfort and anxiety. Moderate conscious sedation is a type of sedation. Under moderate conscious sedation, you are less alert than normal, but you are still able to respond to instructions, touch, or both. Moderate conscious sedation is used during short medical and dental procedures. It is milder than deep sedation, which is a type of sedation under which you cannot be easily woken up. It is also milder than general anesthesia, which is the use of medicines to make you unconscious. Moderate conscious sedation allows you to return to your regular activities sooner. Tell a health care provider about:  Any allergies you have.  All medicines you are   taking, including vitamins, herbs, eye drops, creams, and over-the-counter medicines.  Use of steroids (by mouth or creams).  Any problems you or family members have had with sedatives and anesthetic medicines.  Any blood disorders you have.  Any surgeries you have had.  Any medical conditions you have, such as sleep apnea.  Whether you are pregnant or may be pregnant.  Any use of cigarettes, alcohol, marijuana, or street drugs. What are the risks? Generally, this is a safe procedure. However, problems may occur, including:  Getting too much medicine (oversedation).  Nausea.  Allergic reaction to medicines.  Trouble breathing. If this happens, a breathing tube may be used to help with breathing. It will be removed when you are awake and breathing on your own.  Heart trouble.  Lung trouble. What happens before the procedure? Staying hydrated Follow instructions from your health care provider about hydration, which may include:  Up to 2 hours before the procedure - you may continue to drink clear liquids, such as water, clear fruit juice, black coffee, and plain tea. Eating and drinking  restrictions Follow instructions from your health care provider about eating and drinking, which may include:  8 hours before the procedure - stop eating heavy meals or foods such as meat, fried foods, or fatty foods.  6 hours before the procedure - stop eating light meals or foods, such as toast or cereal.  6 hours before the procedure - stop drinking milk or drinks that contain milk.  2 hours before the procedure - stop drinking clear liquids. Medicine Ask your health care provider about:  Changing or stopping your regular medicines. This is especially important if you are taking diabetes medicines or blood thinners.  Taking medicines such as aspirin and ibuprofen. These medicines can thin your blood. Do not take these medicines before your procedure if your health care provider instructs you not to.  Tests and exams  You will have a physical exam.  You may have blood tests done to show: ? How well your kidneys and liver are working. ? How well your blood can clot. General instructions  Plan to have someone take you home from the hospital or clinic.  If you will be going home right after the procedure, plan to have someone with you for 24 hours. What happens during the procedure?  An IV tube will be inserted into one of your veins.  Medicine to help you relax (sedative) will be given through the IV tube.  The medical or dental procedure will be performed. What happens after the procedure?  Your blood pressure, heart rate, breathing rate, and blood oxygen level will be monitored often until the medicines you were given have worn off.  Do not drive for 24 hours. This information is not intended to replace advice given to you by your health care provider. Make sure you discuss any questions you have with your health care provider. Document Revised: 12/24/2016 Document Reviewed: 05/03/2015 [employer] Patient Education  2020 [employer] Facet Blocks Patient  Information  Description: The facets are joints in the spine between the vertebrae.  Like any joints in the body, facets can become irritated and painful.  Arthritis can also effect the facets.  By injecting steroids and local anesthetic in and around these joints, we can temporarily block the nerve supply to them.  Steroids act directly on irritated nerves and tissues to reduce selling and inflammation which often leads to decreased pain.  Facet blocks may be done anywhere along   the spine from the neck to the low back depending upon the location of your pain.   After numbing the skin with local anesthetic (like Novocaine), a Pruett needle is passed onto the facet joints under x-ray guidance.  You may experience a sensation of pressure while this is being done.  The entire block usually lasts about 15-25 minutes.   Conditions which may be treated by facet blocks:   Low back/buttock pain  Neck/shoulder pain  Certain types of headaches  Preparation for the injection:  2. Do not eat any solid food or dairy products within 8 hours of your appointment. 3. You may drink clear liquid up to 3 hours before appointment.  Clear liquids include water, black coffee, juice or soda.  No milk or cream please. 4. You may take your regular medication, including pain medications, with a sip of water before your appointment.  Diabetics should hold regular insulin (if taken separately) and take 1/2 normal NPH dose the morning of the procedure.  Carry some sugar containing items with you to your appointment. 5. A driver must accompany you and be prepared to drive you home after your procedure. 6. Bring all your current medications with you. 7. An IV may be inserted and sedation may be given at the discretion of the physician. 8. A blood pressure cuff, EKG and other monitors will often be applied during the procedure.  Some patients may need to have extra oxygen administered for a short period. 9. You will be asked  to provide medical information, including your allergies and medications, prior to the procedure.  We must know immediately if you are taking blood thinners (like Coumadin/Warfarin) or if you are allergic to IV iodine contrast (dye).  We must know if you could possible be pregnant.  Possible side-effects:   Bleeding from needle site  Infection (rare, may require surgery)  Nerve injury (rare)  Numbness & tingling (temporary)  Difficulty urinating (rare, temporary)  Spinal headache (a headache worse with upright posture)  Light-headedness (temporary)  Pain at injection site (serveral days)  Decreased blood pressure (rare, temporary)  Weakness in arm/leg (temporary)  Pressure sensation in back/neck (temporary)   Call if you experience:   Fever/chills associated with headache or increased back/neck pain  Headache worsened by an upright position  New onset, weakness or numbness of an extremity below the injection site  Hives or difficulty breathing (go to the emergency room)  Inflammation or drainage at the injection site(s)  Severe back/neck pain greater than usual  New symptoms which are concerning to you  Please note:  Although the local anesthetic injected can often make your back or neck feel good for several hours after the injection, the pain will likely return. It takes 3-7 days for steroids to work.  You may not notice any pain relief for at least one week.  If effective, we will often do a series of 2-3 injections spaced 3-6 weeks apart to maximally decrease your pain.  After the initial series, you may be a candidate for a more permanent nerve block of the facets.  If you have any questions, please call [phone] [facility] 

## 2019-08-15 NOTE — Progress Notes (Signed)
PROVIDER NOTE: Information contained herein reflects review and annotations entered in association with encounter. Interpretation of such information and data should be left to medically-trained personnel. Information provided to patient can be located elsewhere in the medical record under "Patient Instructions". Document created using STT-dictation technology, any transcriptional errors that may result from process are unintentional.    Patient: Ryan [name]  Service Category: E/M  Provider: [name], MD  DOB: [birth year]  DOS: 08/15/2019  Specialty: Interventional Pain Management  MRN: [id number]  Setting: Ambulatory outpatient  PCP: [facility] [place]  Type: Established Patient    Referring Provider: [facility] [place]  Location: Office  Delivery: Face-to-face     HPI  Reason for encounter: Ryan [name], a 64 y.o. year old male, is here today for evaluation and management of his Lumbar spondylosis [M47.816]. Ryan [name] primary complain today is Back Pain (lower, right) Last encounter: Practice (07/25/2019). My last encounter with him was on 07/25/2019. Pertinent problems: Ryan [name] DDD (degenerative disc disease), lumbosacral; Spinal stenosis, lumbar region, with neurogenic claudication; Lumbar radiculopathy; Lumbar herniated disc; Lumbar facet arthropathy; Failed back surgical syndrome; and History of lumbar fusion on their pertinent problem list. Pain Assessment: Severity of Chronic pain is reported as a 6 /10. Location: Back Right, Lower/denies. Onset: More than a month ago. Quality: Burning. Timing: Constant. Modifying factor(s): sleeping; gabapentin knocks the edge off. Vitals:  height is _0  (1.702 m) and weight is 175 lb (79.4 kg). His temporal temperature is 97.5 F (36.4 C) (abnormal). His blood pressure is 136/97 (abnormal) and his pulse is 84. His respiration is 18 and oxygen saturation is 99%.    Patient follows up for postprocedural evaluation  status post L3, L4, L5 RFA on 07/11/2019, right L3, L4, L5 RFA on 07/25/2019.  While the lumbar radiofrequency ablation [name] helped his axial low back pain, patient states that the pain relief that he would receive after his lumbar facet medial branch nerve blocks was superior compared to the radiofrequency ablation.  Patient would like to repeat lumbar facet medial branch nerve blocks and given that he finds these therapeutic, can do these as needed in the future without pursuing further radiofrequency ablation.  Can also consider sprint peripheral nerve stimulation of lumbar medial branch.  Of note he [name] received approximately 75 to 80% pain relief for his low back pain for 8 to 10 weeks after his previous lumbar facet medial branch nerve blocks and also an improvement in his functional status.   ROS  Constitutional: Denies any fever or chills Gastrointestinal: No reported hemesis, hematochezia, vomiting, or acute GI distress Musculoskeletal: Denies any acute onset joint swelling, redness, loss of ROM, or weakness Neurological: No reported episodes of acute onset apraxia, aphasia, dysarthria, agnosia, amnesia, paralysis, loss of coordination, or loss of consciousness  Medication Review  ALPRAZolam, acetaminophen, albuterol, cetirizine, fluticasone, gabapentin, lisinopril, multivitamin, omeprazole, sildenafil, tamsulosin, and tiZANidine  History Review  Allergy: Ryan [name] [name] No Known Allergies. Drug: Ryan [name]  reports no history of drug use. Alcohol:  reports no history of alcohol use. Tobacco:  reports that he [name] been smoking cigarettes. He [name] a 60.00 pack-year smoking history. He [name] never used smokeless tobacco. Social: Ryan [name]  reports that he [name] been smoking cigarettes. He [name] a 60.00 pack-year smoking history. He [name] never used smokeless tobacco. He reports that he does not drink alcohol and does not use drugs. Medical:  [name] a past medical history of Arthritis, Asthma,  Chronic kidney  disease, COPD (chronic obstructive pulmonary disease) (HCC), Dyspnea, GERD (gastroesophageal reflux disease), Hyperlipidemia, Hypertension, Lower urinary tract symptoms (LUTS), Wears dentures, and Wears hearing aid. Surgical: Ryan [name]  [name] a past surgical history that includes Eye surgery; Hernia repair; Transforaminal lumbar interbody fusion (tlif) with pedicle screw fixation 1 level (Right, 06/18/2014); Myringotomy with tube placement (Bilateral, 03/28/2015); Back surgery; Esophagogastroduodenoscopy (egd) with propofol (N/A, 10/24/2015); Cataract extraction w/PHACO (Left, 01/06/2016); Tonsillectomy; Colonoscopy with propofol (N/A, 04/19/2016); and Sinusotomy (02/28/2019). Family: family history includes Arthritis in his mother; Diabetes in his father; Hypertension in his father and mother.  Laboratory Chemistry Profile   Renal Lab Results  Component Value Date   BUN 22 10/19/2017   CREATININE 0.95 10/19/2017   GFRAA >60 10/19/2017   GFRNONAA >60 10/19/2017     Hepatic Lab Results  Component Value Date   AST 18 10/19/2017   ALT 17 10/19/2017   ALBUMIN 4.2 10/19/2017   ALKPHOS 104 10/19/2017   LIPASE 77 (H) 10/19/2017     Electrolytes Lab Results  Component Value Date   NA 140 10/19/2017   K 4.9 10/19/2017   CL 107 10/19/2017   CALCIUM 9.5 10/19/2017     Bone No results found for: VD25OH, VD125OH2TOT, CH8527PO2, UM3536RW4, 25OHVITD1, 25OHVITD2, 25OHVITD3, TESTOFREE, TESTOSTERONE   Inflammation (CRP: Acute Phase) (ESR: Chronic Phase) No results found for: CRP, ESRSEDRATE, LATICACIDVEN     Note: Above Lab results reviewed.   Physical Exam  General appearance: Well nourished, well developed, and well hydrated. In no apparent acute distress Mental status: Alert, oriented x 3 (person, place, & time)       Respiratory: No evidence of acute respiratory distress Eyes: PERLA Vitals: BP (!) 136/97   Pulse 84   Temp (!) 97.5 F (36.4 C) (Temporal)   Resp 18   Ht _0  (1.702 m)    Wt 175 lb (79.4 kg)   SpO2 99%   BMI 27.41 kg/m  BMI: Estimated body mass index is 27.41 kg/m as calculated from the following:   Height as of this encounter: _1  (1.702 m).   Weight as of this encounter: 175 lb (79.4 kg). Ideal: Ideal body weight: 66.1 kg (145 lb 11.6 oz) Adjusted ideal body weight: 71.4 kg (157 lb 6.9 oz)  Lumbar Spine Area Exam  Skin & Axial Inspection: Well healed scar from previous spine surgery detected Alignment: Symmetrical Functional ROM: Improved after treatment however still positive for facet mediated pain with lumbar extension       Stability: No instability detected Muscle Tone/Strength: Functionally intact. No obvious neuro-muscular anomalies detected. Sensory (Neurological): Articular pain pattern  Provocative Tests: Hyperextension/rotation test: (+) bilaterally for facet joint pain. Lumbar quadrant test (Kemp's test): (+) bilaterally for facet joint pain.  Gait & Posture Assessment  Ambulation: Limited Gait: Antalgic gait (limping) Posture: Difficulty standing up straight, due to pain  Lower Extremity Exam    Side: Right lower extremity  Side: Left lower extremity  Stability: No instability observed          Stability: No instability observed          Skin & Extremity Inspection: Skin color, temperature, and hair growth are WNL. No peripheral edema or cyanosis. No masses, redness, swelling, asymmetry, or associated skin lesions. No contractures.  Skin & Extremity Inspection: Skin color, temperature, and hair growth are WNL. No peripheral edema or cyanosis. No masses, redness, swelling, asymmetry, or associated skin lesions. No contractures.  Functional ROM: Pain restricted ROM for hip  and knee joints          Functional ROM: Pain restricted ROM for hip and knee joints          Muscle Tone/Strength: Functionally intact. No obvious neuro-muscular anomalies detected.  Muscle Tone/Strength: Functionally intact. No obvious neuro-muscular anomalies  detected.  Sensory (Neurological): Unimpaired        Sensory (Neurological): Unimpaired        DTR: Patellar: deferred today Achilles: deferred today Plantar: deferred today  DTR: Patellar: deferred today Achilles: deferred today Plantar: deferred today  Palpation: No palpable anomalies  Palpation: No palpable anomalies    Assessment   Status Diagnosis  Persistent Persistent Persistent 1. Lumbar spondylosis   2. Lumbar facet arthropathy   3. Spinal stenosis, lumbar region, with neurogenic claudication   4. Failed back surgical syndrome   5. Chronic pain syndrome      Updated Problems: Problem  Lumbar Facet Arthropathy   status post L3, L4, L5 RFA on 07/11/2019, right L3, L4, L5 RFA on 07/25/2019.  While the lumbar radiofrequency ablation [name] helped his axial low back pain, patient states that the pain relief that he would receive after his lumbar facet medial branch nerve blocks was superior compared to the radiofrequency ablation. Repeat lumbar facets PRN   Failed Back Surgical Syndrome  History of Lumbar Fusion  Lumbar Herniated Disc  Ddd (Degenerative Disc Disease), Lumbosacral  Spinal Stenosis, Lumbar Region, With Neurogenic Claudication  Lumbar Radiculopathy  Chronic Pain Syndrome    Plan of Care   Ryan [name] [name] a current medication list which includes the following long-term medication(s): albuterol, cetirizine, fluticasone, and omeprazole.  Pharmacotherapy (Medications Ordered): Meds ordered this encounter  Medications  . orphenadrine (NORFLEX) injection 30 mg  . ketorolac (TORADOL) 30 MG/ML injection 30 mg   Orders:  Orders Placed This Encounter  Procedures  . LUMBAR FACET(MEDIAL BRANCH NERVE BLOCK) MBNB    Standing Status:   Future    Standing Expiration Date:   09/15/2019    Scheduling Instructions:     Procedure: Lumbar facet block (AKA.: Lumbosacral medial branch nerve block)     Side: Bilateral     Level: L3-4, L4-5, Facets (LL3, L4, L5,   Medial Branch Nerves)     Sedation: with.     Timeframe: ASAA    Order Specific Question:   Where will this procedure be performed?    Answer:   [facility]   Follow-up plan:   Return in about 1 week (around 08/22/2019) for B/L L3, 4, 5 Fcts , with sedation.     Status post bilateral L3, L4, L5, S1 lumbar facet medial branch nerve blocks on 03/20/2018 helped 85% pain relief for approximately 1 year. Status post left L3, L4, L5 RFA on 07/11/2019, Right L3, L4, L5 RFA 07/25/19. Not as helpful as lumbar facet MBBs- repeat PRN, consider SPRINT PNS      Recent Visits Date Type Provider Dept  07/25/19 Procedure visit [name], MD [facility]  07/11/19 Procedure visit [name], MD [facility]  06/26/19 Telemedicine [name], MD [facility]  06/21/19 Office Visit [name], MD [facility]  Showing recent visits within past 90 days and meeting all other requirements Today's Visits Date Type Provider Dept  08/15/19 Office Visit [name], MD [facility]  Showing today's visits and meeting all other requirements Future Appointments Date Type Provider Dept  10/30/19 Appointment [name], MD [facility]  Showing  future appointments within next 90 days and meeting all other requirements  I discussed the assessment and treatment plan with the patient. The patient was provided an opportunity to ask questions and all were answered. The patient agreed with the plan and demonstrated an understanding of the instructions.  Patient advised to call back or seek an in-person evaluation if the symptoms or condition worsens.  Duration of encounter: 30 minutes.  Note by: [name], MD Date: 08/15/2019; Time: 2:05 PM

## 2019-08-27 ENCOUNTER — Encounter: Payer: Self-pay | Admitting: Student in an Organized Health Care Education/Training Program

## 2019-08-27 ENCOUNTER — Ambulatory Visit
Admission: RE | Admit: 2019-08-27 | Discharge: 2019-08-27 | Disposition: A | Discharge status: Home / Self Care | Payer: Medicaid Other | Source: Ambulatory Visit | Attending: Student in an Organized Health Care Education/Training Program | Admitting: Student in an Organized Health Care Education/Training Program

## 2019-08-27 ENCOUNTER — Ambulatory Visit (HOSPITAL_BASED_OUTPATIENT_CLINIC_OR_DEPARTMENT_OTHER): Payer: Medicaid Other | Admitting: Student in an Organized Health Care Education/Training Program

## 2019-08-27 ENCOUNTER — Other Ambulatory Visit: Payer: Self-pay

## 2019-08-27 DIAGNOSIS — M47816 Spondylosis without myelopathy or radiculopathy, lumbar region: Secondary | ICD-10-CM | POA: Insufficient documentation

## 2019-08-27 DIAGNOSIS — G894 Chronic pain syndrome: Secondary | ICD-10-CM | POA: Insufficient documentation

## 2019-08-27 DIAGNOSIS — M4696 Unspecified inflammatory spondylopathy, lumbar region: Secondary | ICD-10-CM

## 2019-08-27 DIAGNOSIS — M48062 Spinal stenosis, lumbar region with neurogenic claudication: Secondary | ICD-10-CM

## 2019-08-27 MED ORDER — FENTANYL CITRATE (PF) 100 MCG/2ML IJ SOLN
INTRAMUSCULAR | Status: AC
  Filled 2019-08-27: qty 2

## 2019-08-27 MED ORDER — FENTANYL CITRATE (PF) 100 MCG/2ML IJ SOLN
25.0000 ug | INTRAMUSCULAR | Status: DC | PRN
  Administered 2019-08-27: 100 ug via INTRAVENOUS

## 2019-08-27 MED ORDER — LIDOCAINE HCL 2 % IJ SOLN
INTRAMUSCULAR | Status: AC
  Filled 2019-08-27: qty 20

## 2019-08-27 MED ORDER — DEXAMETHASONE SODIUM PHOSPHATE 10 MG/ML IJ SOLN
10.0000 mg | Freq: Once | INTRAMUSCULAR | Status: AC
  Administered 2019-08-27: 10 mg

## 2019-08-27 MED ORDER — ROPIVACAINE HCL 2 MG/ML IJ SOLN
9.0000 mL | Freq: Once | INTRAMUSCULAR | Status: AC
  Administered 2019-08-27: 9 mL via PERINEURAL

## 2019-08-27 MED ORDER — LIDOCAINE HCL 2 % IJ SOLN
20.0000 mL | Freq: Once | INTRAMUSCULAR | Status: AC
  Administered 2019-08-27: 400 mg

## 2019-08-27 MED ORDER — DEXAMETHASONE SODIUM PHOSPHATE 10 MG/ML IJ SOLN
INTRAMUSCULAR | Status: AC
  Filled 2019-08-27: qty 2

## 2019-08-27 MED ORDER — LACTATED RINGERS IV SOLN
1000.0000 mL | Freq: Once | INTRAVENOUS | Status: AC
  Administered 2019-08-27: 1000 mL via INTRAVENOUS

## 2019-08-27 MED ORDER — ROPIVACAINE HCL 2 MG/ML IJ SOLN
INTRAMUSCULAR | Status: AC
  Filled 2019-08-27: qty 10

## 2019-08-27 NOTE — Progress Notes (Signed)
Patient's Name: Ryan [name]  MRN: [id number]  Referring Provider: Edward Jolly, MD  DOB: [birth year]  [facility]P: [facility]  DOS: 08/27/2019  Note by: Edward Jolly, MD  Service setting: Ambulatory outpatient  Specialty: Interventional Pain Management  Patient type: Established  Location: [facility] (AMB) Pain Management Facility  Visit type: Interventional Procedure   Primary Reason for Visit: Interventional Pain Management Treatment. CC: Back Pain (lumbar bilateral) and Shoulder Pain (right )  Procedure:          Anesthesia, Analgesia, Anxiolysis:  Type: Lumbar Facet, Medial Branch Block(s) #3  (#1 done 03/20/2018, #2 04/25/2019, RFA performed June 2021 which was not helpful, will plan for therapeutic facets MBB) Primary Purpose: Therapeutic Region: Posterolateral Lumbosacral Spine Level: L3, L4, L5,  Medial Branch Level(s). Injecting these levels blocks the L3-4, L4-5 lumbar facet joints. Laterality: Bilateral  Type: Moderate (Conscious) Sedation combined with Local Anesthesia Indication(s): Analgesia and Anxiety Route: Intravenous (IV) IV Access: Secured Sedation: Meaningful verbal contact was maintained at all times during the procedure  Local Anesthetic: Lidocaine 1-2%  Position: Prone   Indications: 1. Lumbar spondylosis   2. Lumbar facet arthropathy   3. Chronic pain syndrome    Pain Score: Pre-procedure: 7 /10 Post-procedure: 0-No pain/10  Pre-op Assessment:  Ryan [name] is a 64 y.o. (year old), male patient, seen today for interventional treatment. He  has a past surgical history that includes Eye surgery; Hernia repair; Transforaminal lumbar interbody fusion (tlif) with pedicle screw fixation 1 level (Right, 06/18/2014); Myringotomy with tube placement (Bilateral, 03/28/2015); Back surgery; Esophagogastroduodenoscopy (egd) with propofol (N/A, 10/24/2015); Cataract extraction w/PHACO (Left, 01/06/2016); Tonsillectomy; Colonoscopy with propofol (N/A, 04/19/2016); and  Sinusotomy (02/28/2019). Ryan [name] has a current medication list which includes the following prescription(s): acetaminophen, albuterol, alprazolam, cetirizine, fluticasone, gabapentin, lisinopril, omeprazole, sildenafil, tamsulosin, and multivitamin, and the following Facility-Administered Medications: fentanyl. His primarily concern today is the Back Pain (lumbar bilateral) and Shoulder Pain (right )  Initial Vital Signs:  Pulse/HCG Rate: 73ECG Heart Rate: 66 (NSR) Temp: (!) 97.5 F (36.4 C) Resp: 16 BP: (!) 138/98 SpO2: 97 %  BMI: Estimated body mass index is 27.41 kg/m as calculated from the following:   Height as of this encounter: 5\' 7"  (1.702 m).   Weight as of this encounter: 175 lb (79.4 kg).  Risk Assessment: Allergies: Reviewed. He has No Known Allergies.  Allergy Precautions: None required Coagulopathies: Reviewed. None identified.  Blood-thinner therapy: None at this time Active Infection(s): Reviewed. None identified. Ryan [name] is afebrile  Site Confirmation: Ryan [name] was asked to confirm the procedure and laterality before marking the site Procedure checklist: Completed Consent: Before the procedure and under the influence of no sedative(s), amnesic(s), or anxiolytics, the patient was informed of the treatment options, risks and possible complications. To fulfill our ethical and legal obligations, as recommended by the American Medical Association's Code of Ethics, I have informed the patient of my clinical impression; the nature and purpose of the treatment or procedure; the risks, benefits, and possible complications of the intervention; the alternatives, including doing nothing; the risk(s) and benefit(s) of the alternative treatment(s) or procedure(s); and the risk(s) and benefit(s) of doing nothing. The patient was provided information about the general risks and possible complications associated with the procedure. These may include, but are not limited to: failure to  achieve desired goals, infection, bleeding, organ or nerve damage, allergic reactions, paralysis, and death. In addition, the patient was informed of those risks and complications associated to [facility]  procedures, such as failure to decrease pain; infection (i.e.: Meningitis, epidural or intraspinal abscess); bleeding (i.e.: epidural hematoma, subarachnoid hemorrhage, or any other type of intraspinal or peri-dural bleeding); organ or nerve damage (i.e.: Any type of peripheral nerve, nerve root, or spinal cord injury) with subsequent damage to sensory, motor, and/or autonomic systems, resulting in permanent pain, numbness, and/or weakness of one or several areas of the body; allergic reactions; (i.e.: anaphylactic reaction); and/or death. Furthermore, the patient was informed of those risks and complications associated with the medications. These include, but are not limited to: allergic reactions (i.e.: anaphylactic or anaphylactoid reaction(s)); adrenal axis suppression; blood sugar elevation that in diabetics may result in ketoacidosis or comma; water retention that in patients with history of congestive heart failure may result in shortness of breath, pulmonary edema, and decompensation with resultant heart failure; weight gain; swelling or edema; medication-induced neural toxicity; particulate matter embolism and blood vessel occlusion with resultant organ, and/or nervous system infarction; and/or aseptic necrosis of one or more joints. Finally, the patient was informed that Medicine is not an exact science; therefore, there is also the possibility of unforeseen or unpredictable risks and/or possible complications that may result in a catastrophic outcome. The patient indicated having understood very clearly. We have given the patient no guarantees and we have made no promises. Enough time was given to the patient to ask questions, all of which were answered to the patient's satisfaction. Ryan [name] has  indicated that he wanted to continue with the procedure. Attestation: I, the ordering provider, attest that I have discussed with the patient the benefits, risks, side-effects, alternatives, likelihood of achieving goals, and potential problems during recovery for the procedure that I have provided informed consent. Date  Time: 08/27/2019 10:13 AM  Pre-Procedure Preparation:  Monitoring: As per clinic protocol. Respiration, ETCO2, SpO2, BP, heart rate and rhythm monitor placed and checked for adequate function Safety Precautions: Patient was assessed for positional comfort and pressure points before starting the procedure. Time-out: I initiated and conducted the "Time-out" before starting the procedure, as per protocol. The patient was asked to participate by confirming the accuracy of the "Time Out" information. Verification of the correct person, site, and procedure were performed and confirmed by me, the nursing staff, and the patient. "Time-out" conducted as per Joint Commission's Universal Protocol (UP.01.01.01). Time: 1040  Description of Procedure:          Laterality: Bilateral. The procedure was performed in identical fashion on both sides. Levels:  L3, L4, L5,  Medial Branch Level(s) Area Prepped: Posterior Lumbosacral Region Prepping solution: ChloraPrep (2% chlorhexidine gluconate and 70% isopropyl alcohol) Safety Precautions: Aspiration looking for blood return was conducted prior to all injections. At no point did we inject any substances, as a needle was being advanced. Before injecting, the patient was told to immediately notify me if he was experiencing any new onset of "ringing in the ears, or metallic taste in the mouth". No attempts were made at seeking any paresthesias. Safe injection practices and needle disposal techniques used. Medications properly checked for expiration dates. SDV (single dose vial) medications used. After the completion of the procedure, all disposable  equipment used was discarded in the proper designated medical waste containers. Local Anesthesia: Protocol guidelines were followed. The patient was positioned over the fluoroscopy table. The area was prepped in the usual manner. The time-out was completed. The target area was identified using fluoroscopy. A 12-in long, straight, sterile hemostat was used with fluoroscopic guidance to locate the targets  for each level blocked. Once located, the skin was marked with an approved surgical skin marker. Once all sites were marked, the skin (epidermis, dermis, and hypodermis), as well as deeper tissues (fat, connective tissue and muscle) were infiltrated with a Derise amount of a short-acting local anesthetic, loaded on a 10cc syringe with a 25G, 1.5-in  Needle. An appropriate amount of time was allowed for local anesthetics to take effect before proceeding to the next step. Local Anesthetic: Lidocaine 2.0% The unused portion of the local anesthetic was discarded in the proper designated containers. Technical explanation of process:   L3 Medial Branch Nerve Block (MBB): The target area for the L3 medial branch is at the junction of the postero-lateral aspect of the superior articular process and the superior, posterior, and medial edge of the transverse process of L4. Under fluoroscopic guidance, a Quincke needle was inserted until contact was made with os over the superior postero-lateral aspect of the pedicular shadow (target area). After negative aspiration for blood,1 cc  mL of the nerve block solution was injected without difficulty or complication. The needle was removed intact. L4 Medial Branch Nerve Block (MBB): The target area for the L4 medial branch is at the junction of the postero-lateral aspect of the superior articular process and the superior, posterior, and medial edge of the transverse process of L5. Under fluoroscopic guidance, a Quincke needle was inserted until contact was made with os over the  superior postero-lateral aspect of the pedicular shadow (target area). After negative aspiration for blood,1cc mL of the nerve block solution was injected without difficulty or complication. The needle was removed intact. L5 Medial Branch Nerve Block (MBB): The target area for the L5 medial branch is at the junction of the postero-lateral aspect of the superior articular process and the superior, posterior, and medial edge of the sacral ala. Under fluoroscopic guidance, a Quincke needle was inserted until contact was made with os over the superior postero-lateral aspect of the pedicular shadow (target area). After negative aspiration for blood,1cc  mL of the nerve block solution was injected without difficulty or complication. The needle was removed intact.   Nerve block solution: 10 cc solution made of 8 cc of 0.2% ropivacaine, 2 cc of Decadron 10 mg/cc.  1-1.5 cc injected at each level above bilaterally  Procedural Needles: 22-gauge, 3.5-inch, Quincke needles used for all levels.  Once the entire procedure was completed, the treated area was cleaned, making sure to leave some of the prepping solution back to take advantage of its long term bactericidal properties.   Illustration of the posterior view of the lumbar spine and the posterior neural structures. Laminae of L2 through S1 are labeled. DPRL5, dorsal primary ramus of L5; DPRS1, dorsal primary ramus of S1; DPR3, dorsal primary ramus of L3; FJ, facet (zygapophyseal) joint L3-L4; I, inferior articular process of L4; LB1, lateral branch of dorsal primary ramus of L1; IAB, inferior articular branches from L3 medial branch (supplies L4-L5 facet joint); IBP, intermediate branch plexus; MB3, medial branch of dorsal primary ramus of L3; NR3, third lumbar nerve root; S, superior articular process of L5; SAB, superior articular branches from L4 (supplies L4-5 facet joint also); TP3, transverse process of L3.  Vitals:   08/27/19 1045 08/27/19 1050  08/27/19 1101 08/27/19 1112  BP: (!) 140/94 (!) 146/93 (!) 130/91 (!) 160/92  Pulse:      Resp:  12 12 14   Temp:   (!) 97.2 F (36.2 C)   TempSrc:  SpO2: 99% 98% 99% 98%  Weight:      Height:         Start Time: 1040 hrs. End Time: 1050 hrs.  Imaging Guidance (Spinal):          Type of Imaging Technique: Fluoroscopy Guidance (Spinal) Indication(s): Assistance in needle guidance and placement for procedures requiring needle placement in or near specific anatomical locations not easily accessible without such assistance. Exposure Time: Please see nurses notes. Contrast: None used. Fluoroscopic Guidance: I was personally present during the use of fluoroscopy. "Tunnel Vision Technique" used to obtain the best possible view of the target area. Parallax error corrected before commencing the procedure. "Direction-depth-direction" technique used to introduce the needle under continuous pulsed fluoroscopy. Once target was reached, antero-posterior, oblique, and lateral fluoroscopic projection used confirm needle placement in all planes. Images permanently stored in EMR. Interpretation: No contrast injected. I personally interpreted the imaging intraoperatively. Adequate needle placement confirmed in multiple planes. Permanent images saved into the patient's record.  Antibiotic Prophylaxis:   Anti-infectives (From admission, onward)   None     Indication(s): None identified  Post-operative Assessment:  Post-procedure Vital Signs:  Pulse/HCG Rate: 7378 Temp: (!) 97.2 F (36.2 C) Resp: 14 BP: (!) 160/92 SpO2: 98 %  EBL: None  Complications: No immediate post-treatment complications observed by team, or reported by patient.  Note: The patient tolerated the entire procedure well. A repeat set of vitals were taken after the procedure and the patient was kept under observation following institutional policy, for this type of procedure. Post-procedural neurological assessment was  performed, showing return to baseline, prior to discharge. The patient was provided with post-procedure discharge instructions, including a section on how to identify potential problems. Should any problems arise concerning this procedure, the patient was given instructions to immediately contact us, at any time, without hesitation. In any case, we plan to contact the patient by telephone for a follow-up status report regarding this interventional procedure.  Comments:  No additional relevant information.  Plan of Care    Imaging Orders     DG PAIN CLINIC C-ARM 1-60 MIN NO REPORT  Medications ordered for procedure: Meds ordered this encounter  Medications  . lidocaine (XYLOCAINE) 2 % (with pres) injection 400 mg  . lactated ringers infusion 1,000 mL  . fentaNYL (SUBLIMAZE) injection 25-50 mcg    Make sure Narcan is available in the pyxis when using this medication. In the event of respiratory depression (RR< 8/min): Titrate NARCAN (naloxone) in increments of 0.1 to 0.2 mg IV at 2-3 minute intervals, until desired degree of reversal.  . ropivacaine (PF) 2 mg/mL (0.2%) (NAROPIN) injection 9 mL  . dexamethasone (DECADRON) injection 10 mg  . dexamethasone (DECADRON) injection 10 mg   Medications administered: We administered lidocaine, lactated ringers, fentaNYL, ropivacaine (PF) 2 mg/mL (0.2%), dexamethasone, and dexamethasone.  See the medical record for exact dosing, route, and time of administration.  Disposition: Discharge home  Discharge Date & Time: 08/27/2019;   hrs.   Physician-requested Follow-up: Return in about 6 weeks (around 10/08/2019) for Post Procedure Evaluation, virtual.  Future Appointments  Date Time Provider Department Center  09/11/2019 10:00 AM Smitty CordsKaramalegos, Alexander J, DO [facility]  10/08/2019  3:00 PM Edward JollyLateef, Karsen Nakanishi, MD [facility] None  10/30/2019  8:40 AM Edward JollyLateef, Sheela Mcculley, MD [facility] None   Primary [facility] Physician: [facility] Note by: Edward JollyBilal Ammi Hutt, MD Date: 08/27/2019; Time: 11:18 AM  Disclaimer:  Medicine is not  an Chief Strategy Officer. The only guarantee in medicine is that nothing is guaranteed. It is important to note that the decision to proceed with this intervention was based on the information collected from the patient. The Data and conclusions were drawn from the patient's questionnaire, the interview, and the physical examination. Because the information was provided in large part by the patient, it cannot be guaranteed that it has not been purposely or unconsciously manipulated. Every effort has been made to obtain as much relevant data as possible for this evaluation. It is important to note that the conclusions that lead to this procedure are derived in large part from the available data. Always take into account that the treatment will also be dependent on availability of resources and existing treatment guidelines, considered by other Pain Management Practitioners as being common knowledge and practice, at the time of the intervention. For Medico-Legal purposes, it is also important to point out that variation in procedural techniques and pharmacological choices are the acceptable norm. The indications, contraindications, technique, and results of the above procedure should only be interpreted and judged by a Board-Certified Interventional Pain Specialist with extensive familiarity and expertise in the same exact procedure and technique.

## 2019-08-27 NOTE — Progress Notes (Signed)
Safety precautions to be maintained throughout the outpatient stay will include: orient to surroundings, keep bed in low position, maintain call bell within reach at all times, provide assistance with transfer out of bed and ambulation.  

## 2019-08-27 NOTE — Patient Instructions (Signed)

## 2019-08-28 ENCOUNTER — Telehealth: Payer: Self-pay

## 2019-08-28 NOTE — Telephone Encounter (Signed)
Post procedure phone call.  Patient states he is doing good.  

## 2019-08-29 DIAGNOSIS — F411 Generalized anxiety disorder: Secondary | ICD-10-CM | POA: Diagnosis not present

## 2019-08-29 DIAGNOSIS — F341 Dysthymic disorder: Secondary | ICD-10-CM | POA: Diagnosis not present

## 2019-08-31 DIAGNOSIS — F411 Generalized anxiety disorder: Secondary | ICD-10-CM | POA: Diagnosis not present

## 2019-08-31 DIAGNOSIS — F332 Major depressive disorder, recurrent severe without psychotic features: Secondary | ICD-10-CM | POA: Diagnosis not present

## 2019-08-31 DIAGNOSIS — F331 Major depressive disorder, recurrent, moderate: Secondary | ICD-10-CM | POA: Diagnosis not present

## 2019-09-10 ENCOUNTER — Inpatient Hospital Stay
Admission: EM | Admit: 2019-09-10 | Discharge: 2019-09-12 | DRG: 065 | Disposition: A | Discharge status: Home Health | Payer: Medicaid Other | Attending: Family Medicine | Admitting: Family Medicine

## 2019-09-10 ENCOUNTER — Inpatient Hospital Stay: Payer: Medicaid Other

## 2019-09-10 ENCOUNTER — Other Ambulatory Visit: Payer: Self-pay

## 2019-09-10 ENCOUNTER — Emergency Department: Payer: Medicaid Other

## 2019-09-10 DIAGNOSIS — I10 Essential (primary) hypertension: Secondary | ICD-10-CM | POA: Diagnosis present

## 2019-09-10 DIAGNOSIS — M549 Dorsalgia, unspecified: Secondary | ICD-10-CM | POA: Diagnosis present

## 2019-09-10 DIAGNOSIS — Z79899 Other long term (current) drug therapy: Secondary | ICD-10-CM | POA: Diagnosis not present

## 2019-09-10 DIAGNOSIS — Z981 Arthrodesis status: Secondary | ICD-10-CM | POA: Diagnosis not present

## 2019-09-10 DIAGNOSIS — F419 Anxiety disorder, unspecified: Secondary | ICD-10-CM | POA: Diagnosis present

## 2019-09-10 DIAGNOSIS — R52 Pain, unspecified: Secondary | ICD-10-CM | POA: Diagnosis not present

## 2019-09-10 DIAGNOSIS — Z833 Family history of diabetes mellitus: Secondary | ICD-10-CM | POA: Diagnosis not present

## 2019-09-10 DIAGNOSIS — M19011 Primary osteoarthritis, right shoulder: Secondary | ICD-10-CM | POA: Diagnosis not present

## 2019-09-10 DIAGNOSIS — S299XXA Unspecified injury of thorax, initial encounter: Secondary | ICD-10-CM | POA: Diagnosis not present

## 2019-09-10 DIAGNOSIS — Z8249 Family history of ischemic heart disease and other diseases of the circulatory system: Secondary | ICD-10-CM

## 2019-09-10 DIAGNOSIS — G894 Chronic pain syndrome: Secondary | ICD-10-CM | POA: Diagnosis present

## 2019-09-10 DIAGNOSIS — M542 Cervicalgia: Secondary | ICD-10-CM | POA: Diagnosis not present

## 2019-09-10 DIAGNOSIS — Z8261 Family history of arthritis: Secondary | ICD-10-CM | POA: Diagnosis not present

## 2019-09-10 DIAGNOSIS — Z20822 Contact with and (suspected) exposure to covid-19: Secondary | ICD-10-CM | POA: Diagnosis present

## 2019-09-10 DIAGNOSIS — K219 Gastro-esophageal reflux disease without esophagitis: Secondary | ICD-10-CM | POA: Diagnosis present

## 2019-09-10 DIAGNOSIS — R55 Syncope and collapse: Secondary | ICD-10-CM | POA: Diagnosis not present

## 2019-09-10 DIAGNOSIS — I63511 Cerebral infarction due to unspecified occlusion or stenosis of right middle cerebral artery: Principal | ICD-10-CM | POA: Diagnosis present

## 2019-09-10 DIAGNOSIS — G8194 Hemiplegia, unspecified affecting left nondominant side: Secondary | ICD-10-CM | POA: Diagnosis present

## 2019-09-10 DIAGNOSIS — F17210 Nicotine dependence, cigarettes, uncomplicated: Secondary | ICD-10-CM | POA: Diagnosis present

## 2019-09-10 DIAGNOSIS — S199XXA Unspecified injury of neck, initial encounter: Secondary | ICD-10-CM | POA: Diagnosis not present

## 2019-09-10 DIAGNOSIS — J449 Chronic obstructive pulmonary disease, unspecified: Secondary | ICD-10-CM | POA: Diagnosis present

## 2019-09-10 DIAGNOSIS — R29700 NIHSS score 0: Secondary | ICD-10-CM | POA: Diagnosis present

## 2019-09-10 DIAGNOSIS — R000 Tachycardia, unspecified: Secondary | ICD-10-CM | POA: Diagnosis not present

## 2019-09-10 DIAGNOSIS — I639 Cerebral infarction, unspecified: Secondary | ICD-10-CM | POA: Diagnosis not present

## 2019-09-10 DIAGNOSIS — S0990XA Unspecified injury of head, initial encounter: Secondary | ICD-10-CM | POA: Diagnosis not present

## 2019-09-10 LAB — CBC WITH DIFFERENTIAL/PLATELET
Abs Immature Granulocytes: 0.11 10*3/uL — ABNORMAL HIGH (ref 0.00–0.07)
Basophils Absolute: 0.1 10*3/uL (ref 0.0–0.1)
Basophils Relative: 1 %
Eosinophils Absolute: 0.1 10*3/uL (ref 0.0–0.5)
Eosinophils Relative: 1 %
HCT: 53.3 % — ABNORMAL HIGH (ref 39.0–52.0)
Hemoglobin: 18.1 g/dL — ABNORMAL HIGH (ref 13.0–17.0)
Immature Granulocytes: 1 %
Lymphocytes Relative: 19 %
Lymphs Abs: 3.6 10*3/uL (ref 0.7–4.0)
MCH: 30.3 pg (ref 26.0–34.0)
MCHC: 34 g/dL (ref 30.0–36.0)
MCV: 89.1 fL (ref 80.0–100.0)
Monocytes Absolute: 1.1 10*3/uL — ABNORMAL HIGH (ref 0.1–1.0)
Monocytes Relative: 6 %
Neutro Abs: 14 10*3/uL — ABNORMAL HIGH (ref 1.7–7.7)
Neutrophils Relative %: 72 %
Platelets: 293 10*3/uL (ref 150–400)
RBC: 5.98 MIL/uL — ABNORMAL HIGH (ref 4.22–5.81)
RDW: 13.8 % (ref 11.5–15.5)
WBC: 19 10*3/uL — ABNORMAL HIGH (ref 4.0–10.5)
nRBC: 0 % (ref 0.0–0.2)

## 2019-09-10 LAB — COMPREHENSIVE METABOLIC PANEL
ALT: 15 U/L (ref 0–44)
AST: 19 U/L (ref 15–41)
Albumin: 5 g/dL (ref 3.5–5.0)
Alkaline Phosphatase: 133 U/L — ABNORMAL HIGH (ref 38–126)
Anion gap: 15 (ref 5–15)
BUN: 14 mg/dL (ref 8–23)
CO2: 20 mmol/L — ABNORMAL LOW (ref 22–32)
Calcium: 9.3 mg/dL (ref 8.9–10.3)
Chloride: 102 mmol/L (ref 98–111)
Creatinine, Ser: 1.15 mg/dL (ref 0.61–1.24)
GFR calc Af Amer: >60 mL/min (ref >60)
GFR calc non Af Amer: >60 mL/min (ref >60)
Glucose, Bld: 122 mg/dL — ABNORMAL HIGH (ref 70–99)
Potassium: 3.9 mmol/L (ref 3.5–5.1)
Sodium: 137 mmol/L (ref 135–145)
Total Bilirubin: 1.2 mg/dL (ref 0.3–1.2)
Total Protein: 8.3 g/dL — ABNORMAL HIGH (ref 6.5–8.1)

## 2019-09-10 LAB — LIPID PANEL
Cholesterol: 185 mg/dL (ref 0–200)
HDL: 38 mg/dL — ABNORMAL LOW (ref >40)
LDL Cholesterol: 123 mg/dL — ABNORMAL HIGH (ref 0–99)
Total CHOL/HDL Ratio: 4.9 RATIO
Triglycerides: 122 mg/dL (ref <150)
VLDL: 24 mg/dL (ref 0–40)

## 2019-09-10 LAB — PROTIME-INR
INR: 1 (ref 0.8–1.2)
Prothrombin Time: 13.2 seconds (ref 11.4–15.2)

## 2019-09-10 LAB — TSH: TSH: 0.847 u[IU]/mL (ref 0.350–4.500)

## 2019-09-10 LAB — SARS CORONAVIRUS 2 BY RT PCR (HOSPITAL ORDER, PERFORMED IN ~~LOC~~ HOSPITAL LAB): SARS Coronavirus 2: NEGATIVE

## 2019-09-10 LAB — TROPONIN I (HIGH SENSITIVITY): Troponin I (High Sensitivity): 5 ng/L (ref <18)

## 2019-09-10 LAB — ETHANOL: Alcohol, Ethyl (B): <10 mg/dL (ref <10)

## 2019-09-10 LAB — APTT: aPTT: 37 seconds — ABNORMAL HIGH (ref 24–36)

## 2019-09-10 MED ORDER — ONDANSETRON HCL 4 MG/2ML IJ SOLN
4.0000 mg | Freq: Four times a day (QID) | INTRAMUSCULAR | Status: DC | PRN
  Administered 2019-09-11 – 2019-09-12 (×2): 4 mg via INTRAVENOUS
  Filled 2019-09-10 (×2): qty 2

## 2019-09-10 MED ORDER — KETOROLAC TROMETHAMINE 30 MG/ML IJ SOLN
30.0000 mg | Freq: Four times a day (QID) | INTRAMUSCULAR | Status: DC | PRN
  Administered 2019-09-10 – 2019-09-11 (×4): 30 mg via INTRAVENOUS
  Filled 2019-09-10 (×4): qty 1

## 2019-09-10 MED ORDER — GABAPENTIN 100 MG PO CAPS
100.0000 mg | ORAL_CAPSULE | Freq: Three times a day (TID) | ORAL | Status: DC
  Administered 2019-09-11 – 2019-09-12 (×5): 100 mg via ORAL
  Filled 2019-09-10 (×6): qty 1

## 2019-09-10 MED ORDER — ALPRAZOLAM 0.5 MG PO TABS
1.0000 mg | ORAL_TABLET | Freq: Two times a day (BID) | ORAL | Status: DC | PRN
  Administered 2019-09-10 – 2019-09-12 (×3): 1 mg via ORAL
  Filled 2019-09-10 (×3): qty 2

## 2019-09-10 MED ORDER — OXYCODONE HCL 5 MG PO TABS
5.0000 mg | ORAL_TABLET | Freq: Four times a day (QID) | ORAL | Status: DC | PRN
  Administered 2019-09-11 – 2019-09-12 (×4): 5 mg via ORAL
  Filled 2019-09-10 (×4): qty 1

## 2019-09-10 MED ORDER — FENTANYL CITRATE (PF) 100 MCG/2ML IJ SOLN
50.0000 ug | Freq: Once | INTRAMUSCULAR | Status: AC
  Administered 2019-09-10: 50 ug via INTRAVENOUS
  Filled 2019-09-10: qty 2

## 2019-09-10 MED ORDER — ASPIRIN EC 325 MG PO TBEC
325.0000 mg | DELAYED_RELEASE_TABLET | Freq: Every day | ORAL | Status: DC
  Administered 2019-09-11 – 2019-09-12 (×3): 325 mg via ORAL
  Filled 2019-09-10 (×4): qty 1

## 2019-09-10 MED ORDER — SODIUM CHLORIDE 0.9% FLUSH
3.0000 mL | Freq: Two times a day (BID) | INTRAVENOUS | Status: DC
  Administered 2019-09-10 – 2019-09-12 (×4): 3 mL via INTRAVENOUS

## 2019-09-10 MED ORDER — ACETAMINOPHEN 325 MG PO TABS: 650.0000 mg | ORAL_TABLET | Freq: Four times a day (QID) | ORAL | Status: DC | PRN

## 2019-09-10 MED ORDER — LORATADINE 10 MG PO TABS
10.0000 mg | ORAL_TABLET | Freq: Every day | ORAL | Status: DC
  Administered 2019-09-11 – 2019-09-12 (×3): 10 mg via ORAL
  Filled 2019-09-10 (×3): qty 1

## 2019-09-10 MED ORDER — LABETALOL HCL 5 MG/ML IV SOLN: 10.0000 mg | INTRAVENOUS | Status: DC | PRN

## 2019-09-10 MED ORDER — GADOBUTROL 1 MMOL/ML IV SOLN
7.0000 mL | Freq: Once | INTRAVENOUS | Status: AC | PRN
  Administered 2019-09-10: 7 mL via INTRAVENOUS

## 2019-09-10 MED ORDER — ATORVASTATIN CALCIUM 80 MG PO TABS
80.0000 mg | ORAL_TABLET | Freq: Every day | ORAL | Status: DC
  Administered 2019-09-11 – 2019-09-12 (×3): 80 mg via ORAL
  Filled 2019-09-10 (×3): qty 1

## 2019-09-10 MED ORDER — NICOTINE 21 MG/24HR TD PT24
21.0000 mg | MEDICATED_PATCH | Freq: Every day | TRANSDERMAL | Status: DC
  Administered 2019-09-11 – 2019-09-12 (×3): 21 mg via TRANSDERMAL
  Filled 2019-09-10 (×3): qty 1

## 2019-09-10 MED ORDER — TAMSULOSIN HCL 0.4 MG PO CAPS
0.8000 mg | ORAL_CAPSULE | Freq: Every day | ORAL | Status: DC
  Administered 2019-09-11 – 2019-09-12 (×3): 0.8 mg via ORAL
  Filled 2019-09-10 (×3): qty 2

## 2019-09-10 MED ORDER — ALBUTEROL SULFATE (2.5 MG/3ML) 0.083% IN NEBU: 2.5000 mg | INHALATION_SOLUTION | Freq: Four times a day (QID) | RESPIRATORY_TRACT | Status: DC | PRN

## 2019-09-10 MED ORDER — SODIUM CHLORIDE 0.9 % IV BOLUS
500.0000 mL | Freq: Once | INTRAVENOUS | Status: AC
  Administered 2019-09-10: 500 mL via INTRAVENOUS
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
## 2019-09-10 NOTE — ED Notes (Signed)
Pt speaking with deputy in complete sentences without shob
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
## 2019-09-11 ENCOUNTER — Ambulatory Visit: Payer: Self-pay | Admitting: Family Medicine

## 2019-09-11 ENCOUNTER — Inpatient Hospital Stay: Admit: 2019-09-11 | Payer: Medicaid Other

## 2019-09-11 ENCOUNTER — Inpatient Hospital Stay (HOSPITAL_COMMUNITY)
Admit: 2019-09-11 | Discharge: 2019-09-11 | Disposition: A | Discharge status: Home / Self Care | Payer: Medicaid Other | Attending: Internal Medicine | Admitting: Internal Medicine

## 2019-09-11 DIAGNOSIS — R55 Syncope and collapse: Secondary | ICD-10-CM

## 2019-09-11 DIAGNOSIS — I639 Cerebral infarction, unspecified: Secondary | ICD-10-CM

## 2019-09-11 LAB — ECHOCARDIOGRAM COMPLETE
AR max vel: 1.65 cm2
AV Area VTI: 1.2 cm2
AV Area mean vel: 1.51 cm2
AV Mean grad: 3 mmHg
AV Peak grad: 6.2 mmHg
Ao pk vel: 1.24 m/s
Area-P 1/2: 3.48 cm2
Height: 67 in
S' Lateral: 3.11 cm
Weight: 2608 oz

## 2019-09-11 LAB — CBC
HCT: 46.1 % (ref 39.0–52.0)
Hemoglobin: 16.2 g/dL (ref 13.0–17.0)
MCH: 31 pg (ref 26.0–34.0)
MCHC: 35.1 g/dL (ref 30.0–36.0)
MCV: 88.1 fL (ref 80.0–100.0)
Platelets: 242 10*3/uL (ref 150–400)
RBC: 5.23 MIL/uL (ref 4.22–5.81)
RDW: 13.9 % (ref 11.5–15.5)
WBC: 14.8 10*3/uL — ABNORMAL HIGH (ref 4.0–10.5)
nRBC: 0 % (ref 0.0–0.2)

## 2019-09-11 LAB — HIV ANTIBODY (ROUTINE TESTING W REFLEX): HIV Screen 4th Generation wRfx: NONREACTIVE

## 2019-09-11 LAB — BASIC METABOLIC PANEL
Anion gap: 12 (ref 5–15)
BUN: 17 mg/dL (ref 8–23)
CO2: 22 mmol/L (ref 22–32)
Calcium: 8.6 mg/dL — ABNORMAL LOW (ref 8.9–10.3)
Chloride: 99 mmol/L (ref 98–111)
Creatinine, Ser: 1.26 mg/dL — ABNORMAL HIGH (ref 0.61–1.24)
GFR calc Af Amer: >60 mL/min (ref >60)
GFR calc non Af Amer: >60 mL/min (ref >60)
Glucose, Bld: 155 mg/dL — ABNORMAL HIGH (ref 70–99)
Potassium: 3.5 mmol/L (ref 3.5–5.1)
Sodium: 133 mmol/L — ABNORMAL LOW (ref 135–145)

## 2019-09-11 LAB — HEMOGLOBIN A1C
Hgb A1c MFr Bld: 6 % — ABNORMAL HIGH (ref 4.8–5.6)
Mean Plasma Glucose: 125.5 mg/dL

## 2019-09-11 MED ORDER — HYDROMORPHONE HCL 1 MG/ML IJ SOLN
1.0000 mg | Freq: Once | INTRAMUSCULAR | Status: AC
  Administered 2019-09-11: 1 mg via INTRAVENOUS
  Filled 2019-09-11: qty 1

## 2019-09-11 MED ORDER — TRAZODONE HCL 50 MG PO TABS: 50.0000 mg | ORAL_TABLET | Freq: Every evening | ORAL | Status: DC | PRN

## 2019-09-11 NOTE — Plan of Care (Signed)

## 2019-09-11 NOTE — Progress Notes (Signed)
*  PRELIMINARY RESULTS* Echocardiogram 2D Echocardiogram has been performed.  Ryan [name] 09/11/2019, 11:35 AM

## 2019-09-11 NOTE — Progress Notes (Addendum)
   09/11/19 0700  Clinical Encounter Type  Visited With Patient  Visit Type Initial  Referral From Nurse  Consult/Referral To Chaplain  Chaplain responded to an OR for prayer. When chaplain arrived, patient looked puzzled when asked about prayer. He said I myself. Chaplain said ok, but patient continued talking and eventually the conversation led to prayer. Before chaplain prayed, patient said doctors told him that he had a stroke. Patient asked chaplain to pray for the pain he is experiencing in his back and chaplain did pray according to his request. After prayer, patient mentioned that he is stressed about taking care of 3 properties, taking care of his 48 yr old mother, who has dementia, and going to doctors' appts. Chaplain asked patient what he could do to relieve himself of some of the pressure he is under. After thinking for a while,he said maybe he can get Meals on Wheels for his mother, than his wife won't have to worry about cooking for her. Chaplain asked if someone can help with mowing the 3 properties and he mentioned having 3 sons. One son does do Tree surgeon suggested maybe he can help, plus he lives on one of the properties. Patient told this opportunity to talk about several things that are bothering him and chaplain hope she created a good listening ear. Patient said that he hasn't slept over the last two nights but did mention this to the nurse and she put a note on his chart to make his doctor aware. Chaplain will pass information about patient on to the chaplain who visits this unit.  Chaplain told patient that she would come back tomorrow and hopefully meet his wife. Patient said, I hope he (the doctor) will let me go today.

## 2019-09-11 NOTE — Progress Notes (Addendum)
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 Intravenous Q12H  . tamsulosin  0.8 mg Oral Daily    Continuous Infusions:   LOS: 1 day     Darlin Drop, MD [facility] Pager [phone]  If 7PM-7AM, please contact night-coverage [email or web address] Password [facility] 09/11/2019, 3:46 PM
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
## 2019-09-12 DIAGNOSIS — I639 Cerebral infarction, unspecified: Secondary | ICD-10-CM | POA: Diagnosis present

## 2019-09-12 LAB — GLUCOSE, CAPILLARY: Glucose-Capillary: 134 mg/dL — ABNORMAL HIGH (ref 70–99)

## 2019-09-12 MED ORDER — ATORVASTATIN CALCIUM 80 MG PO TABS: 80.0000 mg | ORAL_TABLET | Freq: Every day | ORAL | 3 refills | Status: DC

## 2019-09-12 MED ORDER — ASPIRIN 81 MG PO TBEC: 81.0000 mg | DELAYED_RELEASE_TABLET | Freq: Every day | ORAL | 11 refills | Status: DC

## 2019-09-12 MED ORDER — NICOTINE 21 MG/24HR TD PT24: 21.0000 mg | MEDICATED_PATCH | Freq: Every day | TRANSDERMAL | 2 refills | Status: DC

## 2019-09-12 MED ORDER — ASPIRIN EC 81 MG PO TBEC: 81.0000 mg | DELAYED_RELEASE_TABLET | Freq: Every day | ORAL | Status: DC

## 2019-09-12 MED ORDER — GABAPENTIN 100 MG PO CAPS: 100.0000 mg | ORAL_CAPSULE | Freq: Three times a day (TID) | ORAL | 2 refills | Status: DC

## 2019-09-12 MED ORDER — LISINOPRIL 10 MG PO TABS: 10.0000 mg | ORAL_TABLET | Freq: Every day | ORAL | 1 refills | Status: DC

## 2019-09-12 NOTE — Discharge Summary (Addendum)
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 Sheet for Patients:   BoilerBrush.com.cy  Fact Sheet for Healthcare Providers: https://pope.com/  This test is not yet approved or  cleared by the [place] FDA and has been authorized for detection and/or diagnosis of SARS-CoV-2 by FDA under an Emergency Use Authorization (EUA).  This EUA will remain in effect (meaning this test can be used) for the duration of the COVID-19 declaration under Section 564(b)(1) of the Act, 21 U.S.C. section 360bbb-3(b)(1), unless the authorization is terminated or revoked sooner.  Performed at [facility], [address] [place], [place] [address]      Labs:   CBC: Recent Labs  Lab 09/10/19 1623 09/11/19 0510  WBC 19.0* 14.8*  NEUTROABS 14.0*  --   HGB 18.1* 16.2  HCT  53.3* 46.1  MCV 89.1 88.1  PLT 293 242   Basic Metabolic Panel: Recent Labs  Lab 09/10/19 1623 09/11/19 0510  NA 137 133*  K 3.9 3.5  CL 102 99  CO2 20* 22  GLUCOSE 122* 155*  BUN 14 17  CREATININE 1.15 1.26*  CALCIUM 9.3 8.6*   Liver Function Tests: Recent Labs  Lab 09/10/19 1623   AST 19  ALT 15  ALKPHOS 133*  BILITOT 1.2  PROT 8.3*  ALBUMIN 5.0   BNP (last 3 results) No results for input(s): BNP in the last 8760 hours. Cardiac Enzymes: No results for input(s): CKTOTAL, CKMB, CKMBINDEX, TROPONINI in the last 168 hours. CBG: Recent Labs  Lab 09/12/19 0457  GLUCAP 134*   Hgb A1c Recent Labs    09/10/19 1752  HGBA1C 6.0*   Lipid Profile Recent Labs    09/10/19 1752  CHOL 185  HDL 38*  LDLCALC 123*  TRIG 122  CHOLHDL 4.9   Thyroid function studies Recent Labs    09/10/19 1752  TSH 0.847   Anemia work up No results for input(s): VITAMINB12, FOLATE, FERRITIN, TIBC, IRON, RETICCTPCT in the last 72 hours. Urinalysis    Component Value Date/Time   COLORURINE YELLOW (A) 10/19/2017 1646   APPEARANCEUR CLEAR (A) 10/19/2017 1646   APPEARANCEUR Cloudy 07/15/2011 1713   LABSPEC 1.019 10/19/2017 1646   LABSPEC 1.019 07/15/2011 1713   PHURINE 5.0 10/19/2017 1646   GLUCOSEU NEGATIVE 10/19/2017 1646   GLUCOSEU Negative 07/15/2011 1713   HGBUR NEGATIVE 10/19/2017 1646   BILIRUBINUR NEGATIVE 10/19/2017 1646   BILIRUBINUR Negative 07/15/2011 1713   KETONESUR NEGATIVE 10/19/2017 1646   PROTEINUR NEGATIVE 10/19/2017 1646   NITRITE NEGATIVE 10/19/2017 1646   LEUKOCYTESUR NEGATIVE 10/19/2017 1646   LEUKOCYTESUR Negative 07/15/2011 1713         Time coordinating discharge: Over 45 minutes  SIGNED: Kendell Bane, MD, FACP, FHM. [facility],  Please use [email or web address] to Page If 7PM-7AM, please contact night-coverage [email or web address] 09/12/2019, 9:55 AM

## 2019-09-12 NOTE — Plan of Care (Addendum)
Patient is being discharge home with home health, discharge instruction provided, iv removed tele removed , patient to pick up his medication from [facility]    Problem: Education: Goal: Knowledge of disease or condition will improve Outcome: Adequate for Discharge Goal: Knowledge of secondary prevention will improve Outcome: Adequate for Discharge Goal: Knowledge of patient specific risk factors addressed and post discharge goals established will improve Outcome: Adequate for Discharge
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
## 2019-09-13 DIAGNOSIS — F411 Generalized anxiety disorder: Secondary | ICD-10-CM | POA: Diagnosis not present

## 2019-09-13 DIAGNOSIS — F341 Dysthymic disorder: Secondary | ICD-10-CM | POA: Diagnosis not present

## 2019-09-14 DIAGNOSIS — M549 Dorsalgia, unspecified: Secondary | ICD-10-CM | POA: Diagnosis not present

## 2019-09-14 DIAGNOSIS — N189 Chronic kidney disease, unspecified: Secondary | ICD-10-CM | POA: Diagnosis not present

## 2019-09-14 DIAGNOSIS — J449 Chronic obstructive pulmonary disease, unspecified: Secondary | ICD-10-CM | POA: Diagnosis not present

## 2019-09-14 DIAGNOSIS — M19042 Primary osteoarthritis, left hand: Secondary | ICD-10-CM | POA: Diagnosis not present

## 2019-09-14 DIAGNOSIS — G894 Chronic pain syndrome: Secondary | ICD-10-CM | POA: Diagnosis not present

## 2019-09-14 DIAGNOSIS — I129 Hypertensive chronic kidney disease with stage 1 through stage 4 chronic kidney disease, or unspecified chronic kidney disease: Secondary | ICD-10-CM | POA: Diagnosis not present

## 2019-09-14 DIAGNOSIS — R2689 Other abnormalities of gait and mobility: Secondary | ICD-10-CM | POA: Diagnosis not present

## 2019-09-14 DIAGNOSIS — K219 Gastro-esophageal reflux disease without esophagitis: Secondary | ICD-10-CM | POA: Diagnosis not present

## 2019-09-14 DIAGNOSIS — R338 Other retention of urine: Secondary | ICD-10-CM | POA: Diagnosis not present

## 2019-09-14 DIAGNOSIS — I69398 Other sequelae of cerebral infarction: Secondary | ICD-10-CM | POA: Diagnosis not present

## 2019-09-14 DIAGNOSIS — R55 Syncope and collapse: Secondary | ICD-10-CM | POA: Diagnosis not present

## 2019-09-14 DIAGNOSIS — N401 Enlarged prostate with lower urinary tract symptoms: Secondary | ICD-10-CM | POA: Diagnosis not present

## 2019-09-14 DIAGNOSIS — E785 Hyperlipidemia, unspecified: Secondary | ICD-10-CM | POA: Diagnosis not present

## 2019-09-21 DIAGNOSIS — R2689 Other abnormalities of gait and mobility: Secondary | ICD-10-CM | POA: Diagnosis not present

## 2019-09-21 DIAGNOSIS — I129 Hypertensive chronic kidney disease with stage 1 through stage 4 chronic kidney disease, or unspecified chronic kidney disease: Secondary | ICD-10-CM | POA: Diagnosis not present

## 2019-09-21 DIAGNOSIS — N401 Enlarged prostate with lower urinary tract symptoms: Secondary | ICD-10-CM | POA: Diagnosis not present

## 2019-09-21 DIAGNOSIS — M549 Dorsalgia, unspecified: Secondary | ICD-10-CM | POA: Diagnosis not present

## 2019-09-21 DIAGNOSIS — M19042 Primary osteoarthritis, left hand: Secondary | ICD-10-CM | POA: Diagnosis not present

## 2019-09-21 DIAGNOSIS — G894 Chronic pain syndrome: Secondary | ICD-10-CM | POA: Diagnosis not present

## 2019-09-21 DIAGNOSIS — I69398 Other sequelae of cerebral infarction: Secondary | ICD-10-CM | POA: Diagnosis not present

## 2019-09-21 DIAGNOSIS — R55 Syncope and collapse: Secondary | ICD-10-CM | POA: Diagnosis not present

## 2019-09-21 DIAGNOSIS — R338 Other retention of urine: Secondary | ICD-10-CM | POA: Diagnosis not present

## 2019-09-21 DIAGNOSIS — N189 Chronic kidney disease, unspecified: Secondary | ICD-10-CM | POA: Diagnosis not present

## 2019-09-21 DIAGNOSIS — K219 Gastro-esophageal reflux disease without esophagitis: Secondary | ICD-10-CM | POA: Diagnosis not present

## 2019-09-21 DIAGNOSIS — J449 Chronic obstructive pulmonary disease, unspecified: Secondary | ICD-10-CM | POA: Diagnosis not present

## 2019-09-21 DIAGNOSIS — E785 Hyperlipidemia, unspecified: Secondary | ICD-10-CM | POA: Diagnosis not present

## 2019-09-26 DIAGNOSIS — R338 Other retention of urine: Secondary | ICD-10-CM | POA: Diagnosis not present

## 2019-09-26 DIAGNOSIS — N401 Enlarged prostate with lower urinary tract symptoms: Secondary | ICD-10-CM | POA: Diagnosis not present

## 2019-09-26 DIAGNOSIS — I69398 Other sequelae of cerebral infarction: Secondary | ICD-10-CM | POA: Diagnosis not present

## 2019-09-26 DIAGNOSIS — R55 Syncope and collapse: Secondary | ICD-10-CM | POA: Diagnosis not present

## 2019-09-26 DIAGNOSIS — G894 Chronic pain syndrome: Secondary | ICD-10-CM | POA: Diagnosis not present

## 2019-09-26 DIAGNOSIS — M549 Dorsalgia, unspecified: Secondary | ICD-10-CM | POA: Diagnosis not present

## 2019-09-26 DIAGNOSIS — R2689 Other abnormalities of gait and mobility: Secondary | ICD-10-CM | POA: Diagnosis not present

## 2019-09-26 DIAGNOSIS — J449 Chronic obstructive pulmonary disease, unspecified: Secondary | ICD-10-CM | POA: Diagnosis not present

## 2019-09-26 DIAGNOSIS — E785 Hyperlipidemia, unspecified: Secondary | ICD-10-CM | POA: Diagnosis not present

## 2019-09-26 DIAGNOSIS — N189 Chronic kidney disease, unspecified: Secondary | ICD-10-CM | POA: Diagnosis not present

## 2019-09-26 DIAGNOSIS — I129 Hypertensive chronic kidney disease with stage 1 through stage 4 chronic kidney disease, or unspecified chronic kidney disease: Secondary | ICD-10-CM | POA: Diagnosis not present

## 2019-09-26 DIAGNOSIS — M19042 Primary osteoarthritis, left hand: Secondary | ICD-10-CM | POA: Diagnosis not present

## 2019-09-26 DIAGNOSIS — K219 Gastro-esophageal reflux disease without esophagitis: Secondary | ICD-10-CM | POA: Diagnosis not present

## 2019-10-03 DIAGNOSIS — N401 Enlarged prostate with lower urinary tract symptoms: Secondary | ICD-10-CM | POA: Diagnosis not present

## 2019-10-03 DIAGNOSIS — J449 Chronic obstructive pulmonary disease, unspecified: Secondary | ICD-10-CM | POA: Diagnosis not present

## 2019-10-03 DIAGNOSIS — R2689 Other abnormalities of gait and mobility: Secondary | ICD-10-CM | POA: Diagnosis not present

## 2019-10-03 DIAGNOSIS — I69398 Other sequelae of cerebral infarction: Secondary | ICD-10-CM | POA: Diagnosis not present

## 2019-10-03 DIAGNOSIS — R338 Other retention of urine: Secondary | ICD-10-CM | POA: Diagnosis not present

## 2019-10-03 DIAGNOSIS — E785 Hyperlipidemia, unspecified: Secondary | ICD-10-CM | POA: Diagnosis not present

## 2019-10-03 DIAGNOSIS — M549 Dorsalgia, unspecified: Secondary | ICD-10-CM | POA: Diagnosis not present

## 2019-10-03 DIAGNOSIS — G894 Chronic pain syndrome: Secondary | ICD-10-CM | POA: Diagnosis not present

## 2019-10-03 DIAGNOSIS — R55 Syncope and collapse: Secondary | ICD-10-CM | POA: Diagnosis not present

## 2019-10-03 DIAGNOSIS — I129 Hypertensive chronic kidney disease with stage 1 through stage 4 chronic kidney disease, or unspecified chronic kidney disease: Secondary | ICD-10-CM | POA: Diagnosis not present

## 2019-10-03 DIAGNOSIS — K219 Gastro-esophageal reflux disease without esophagitis: Secondary | ICD-10-CM | POA: Diagnosis not present

## 2019-10-03 DIAGNOSIS — M19042 Primary osteoarthritis, left hand: Secondary | ICD-10-CM | POA: Diagnosis not present

## 2019-10-03 DIAGNOSIS — N189 Chronic kidney disease, unspecified: Secondary | ICD-10-CM | POA: Diagnosis not present

## 2019-10-04 ENCOUNTER — Encounter: Payer: Self-pay | Admitting: Student in an Organized Health Care Education/Training Program

## 2019-10-08 ENCOUNTER — Other Ambulatory Visit: Payer: Self-pay

## 2019-10-08 ENCOUNTER — Encounter: Payer: Self-pay | Admitting: Student in an Organized Health Care Education/Training Program

## 2019-10-08 ENCOUNTER — Ambulatory Visit
Payer: Medicaid Other | Attending: Student in an Organized Health Care Education/Training Program | Admitting: Student in an Organized Health Care Education/Training Program

## 2019-10-08 DIAGNOSIS — M47816 Spondylosis without myelopathy or radiculopathy, lumbar region: Secondary | ICD-10-CM | POA: Diagnosis not present

## 2019-10-08 DIAGNOSIS — M5137 Other intervertebral disc degeneration, lumbosacral region: Secondary | ICD-10-CM | POA: Diagnosis not present

## 2019-10-08 DIAGNOSIS — Z981 Arthrodesis status: Secondary | ICD-10-CM

## 2019-10-08 DIAGNOSIS — M961 Postlaminectomy syndrome, not elsewhere classified: Secondary | ICD-10-CM | POA: Diagnosis not present

## 2019-10-08 DIAGNOSIS — G894 Chronic pain syndrome: Secondary | ICD-10-CM

## 2019-10-08 DIAGNOSIS — M48062 Spinal stenosis, lumbar region with neurogenic claudication: Secondary | ICD-10-CM | POA: Diagnosis not present

## 2019-10-08 DIAGNOSIS — M5416 Radiculopathy, lumbar region: Secondary | ICD-10-CM
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
## 2019-10-10 DIAGNOSIS — R2689 Other abnormalities of gait and mobility: Secondary | ICD-10-CM | POA: Diagnosis not present

## 2019-10-10 DIAGNOSIS — E785 Hyperlipidemia, unspecified: Secondary | ICD-10-CM | POA: Diagnosis not present

## 2019-10-10 DIAGNOSIS — M549 Dorsalgia, unspecified: Secondary | ICD-10-CM | POA: Diagnosis not present

## 2019-10-10 DIAGNOSIS — N189 Chronic kidney disease, unspecified: Secondary | ICD-10-CM | POA: Diagnosis not present

## 2019-10-10 DIAGNOSIS — J449 Chronic obstructive pulmonary disease, unspecified: Secondary | ICD-10-CM | POA: Diagnosis not present

## 2019-10-10 DIAGNOSIS — G894 Chronic pain syndrome: Secondary | ICD-10-CM | POA: Diagnosis not present

## 2019-10-10 DIAGNOSIS — I129 Hypertensive chronic kidney disease with stage 1 through stage 4 chronic kidney disease, or unspecified chronic kidney disease: Secondary | ICD-10-CM | POA: Diagnosis not present

## 2019-10-10 DIAGNOSIS — M19042 Primary osteoarthritis, left hand: Secondary | ICD-10-CM | POA: Diagnosis not present

## 2019-10-10 DIAGNOSIS — R338 Other retention of urine: Secondary | ICD-10-CM | POA: Diagnosis not present

## 2019-10-10 DIAGNOSIS — I69398 Other sequelae of cerebral infarction: Secondary | ICD-10-CM | POA: Diagnosis not present

## 2019-10-10 DIAGNOSIS — R55 Syncope and collapse: Secondary | ICD-10-CM | POA: Diagnosis not present

## 2019-10-10 DIAGNOSIS — N401 Enlarged prostate with lower urinary tract symptoms: Secondary | ICD-10-CM | POA: Diagnosis not present

## 2019-10-10 DIAGNOSIS — K219 Gastro-esophageal reflux disease without esophagitis: Secondary | ICD-10-CM | POA: Diagnosis not present

## 2019-10-15 ENCOUNTER — Telehealth: Payer: Self-pay

## 2019-10-15 NOTE — Telephone Encounter (Signed)
Insurance denied Facets. State does not meet medical necessity.

## 2019-10-23 ENCOUNTER — Encounter: Payer: Self-pay | Admitting: Family Medicine

## 2019-10-23 ENCOUNTER — Ambulatory Visit (INDEPENDENT_AMBULATORY_CARE_PROVIDER_SITE_OTHER): Payer: Medicaid Other | Admitting: Family Medicine

## 2019-10-23 ENCOUNTER — Other Ambulatory Visit: Payer: Self-pay

## 2019-10-23 VITALS — BP 140/80 | HR 81 | Temp 97.8°F | Resp 16 | Ht 67.0 in | Wt 172.6 lb

## 2019-10-23 DIAGNOSIS — J432 Centrilobular emphysema: Secondary | ICD-10-CM

## 2019-10-23 DIAGNOSIS — G894 Chronic pain syndrome: Secondary | ICD-10-CM

## 2019-10-23 DIAGNOSIS — R7303 Prediabetes: Secondary | ICD-10-CM

## 2019-10-23 DIAGNOSIS — Z981 Arthrodesis status: Secondary | ICD-10-CM | POA: Diagnosis not present

## 2019-10-23 DIAGNOSIS — E782 Mixed hyperlipidemia: Secondary | ICD-10-CM | POA: Diagnosis not present

## 2019-10-23 DIAGNOSIS — Z8673 Personal history of transient ischemic attack (TIA), and cerebral infarction without residual deficits: Secondary | ICD-10-CM

## 2019-10-23 DIAGNOSIS — Z23 Encounter for immunization: Secondary | ICD-10-CM | POA: Diagnosis not present

## 2019-10-23 DIAGNOSIS — N401 Enlarged prostate with lower urinary tract symptoms: Secondary | ICD-10-CM | POA: Diagnosis not present

## 2019-10-23 DIAGNOSIS — M961 Postlaminectomy syndrome, not elsewhere classified: Secondary | ICD-10-CM

## 2019-10-23 DIAGNOSIS — M5416 Radiculopathy, lumbar region: Secondary | ICD-10-CM | POA: Diagnosis not present

## 2019-10-23 DIAGNOSIS — I10 Essential (primary) hypertension: Secondary | ICD-10-CM

## 2019-10-23 DIAGNOSIS — E663 Overweight: Secondary | ICD-10-CM

## 2019-10-23 DIAGNOSIS — R350 Frequency of micturition: Secondary | ICD-10-CM

## 2019-10-23 DIAGNOSIS — E785 Hyperlipidemia, unspecified: Secondary | ICD-10-CM | POA: Insufficient documentation

## 2019-10-23 MED ORDER — TAMSULOSIN HCL 0.4 MG PO CAPS: 0.8000 mg | ORAL_CAPSULE | Freq: Two times a day (BID) | ORAL | 1 refills | Status: DC

## 2019-10-23 MED ORDER — ATORVASTATIN CALCIUM 80 MG PO TABS: 80.0000 mg | ORAL_TABLET | Freq: Every day | ORAL | 1 refills | Status: DC

## 2019-10-23 MED ORDER — LISINOPRIL 20 MG PO TABS: 20.0000 mg | ORAL_TABLET | Freq: Every day | ORAL | 1 refills | Status: DC

## 2019-10-23 NOTE — Progress Notes (Signed)
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 pain syndrome   Relevant Medications   gabapentin (NEURONTIN) 400 MG capsule   Centrilobular emphysema (HCC)    Other Visit Diagnoses    Needs flu shot       Relevant Orders   Flu Vaccine QUAD 36+ mos IM   Benign prostatic hyperplasia with urinary frequency       Relevant Medications   tamsulosin (FLOMAX) 0.4 MG CAPS capsule      #PreDM Encourage  lifestyle management, A1c 6.0  #BPH Continue Flomax, re order, 0.4 BID  #COPD/ Tobacco Encourage smoking cessation Future consider maintenance therapy  #Chronic Back Pain / Pain Syndrome Lumbar stenosis/radiculopathy Followed  by [facility] pain management On Gabapentin Injections per pain management  #Hypertension #Hyperlipidemia History of CVA without residual deficits Refilled his maintenance medication, atorvastatin, continue Aspirin, inc dose of Lisinopril 10 to , reviewed BP readings  Completed form from DMV, cleared him to resume driving, without restrictions, discussed precautions.  Meds ordered this encounter  Medications  . atorvastatin (LIPITOR) 80 MG tablet    Sig: Take 1 tablet (80 mg total) by mouth at bedtime.    Dispense:  90 tablet    Refill:  1  . lisinopril (ZESTRIL) 20 MG tablet    Sig: Take 1 tablet (20 mg total) by mouth daily.    Dispense:  90 tablet    Refill:  1  . tamsulosin (FLOMAX) 0.4 MG CAPS capsule    Sig: Take 2 capsules (0.8 mg total) by mouth in the morning and at bedtime.    Dispense:  180 capsule    Refill:  1      Follow up plan: Return in about 3 months (around 01/22/2020) for 3 month  HTN.  Saralyn Pilar, DO [facility] 10/23/2019, 10:47 AM

## 2019-10-23 NOTE — Patient Instructions (Addendum)
Thank you for coming to the office today.  Please call us back to leave [place] the brand name and dates on your COVID19 vaccine card, so we can update.  Mail in the [facility] paperwork  Inc lisinopril from 10 to 20mg  new rx ordered  Please schedule a Follow-up Appointment to: Return in about 3 months (around 01/22/2020) for 3 month  HTN.  If you have any other questions or concerns, please feel free to call the office or send a message through MyChart. You may also schedule an earlier appointment if necessary.  Additionally, you may be receiving a survey about your experience at our office within a few days to 1 week by e-mail or mail. We value your feedback.  01/24/2020, DO [facility], [facility]

## 2019-10-25 ENCOUNTER — Telehealth: Payer: Self-pay | Admitting: Family Medicine

## 2019-10-25 DIAGNOSIS — N401 Enlarged prostate with lower urinary tract symptoms: Secondary | ICD-10-CM

## 2019-10-25 MED ORDER — TAMSULOSIN HCL 0.4 MG PO CAPS: 0.4000 mg | ORAL_CAPSULE | Freq: Two times a day (BID) | ORAL | 1 refills | Status: DC

## 2019-10-25 NOTE — Telephone Encounter (Signed)
Called pharmacy, corrected the rx, it should have been 0.4mg  twice a day, not 0.8 twice a day. Previous historical rx was listed as 0.8 and edited the rx to re order it was sent incorrectly. Rx is now correct.  Ryan Pilar, DO [facility] 10/25/2019, 1:34 PM

## 2019-10-25 NOTE — Telephone Encounter (Signed)
Ryan [name], from [facility], called for clarification on the pts tamsolusin, She states that the prescription was sent in as two in the morning and two at night. S/he states that she is concerned because she usually only see it as 2 times a day. Please advise .

## 2019-10-30 ENCOUNTER — Encounter: Payer: Self-pay | Admitting: Student in an Organized Health Care Education/Training Program

## 2019-10-30 ENCOUNTER — Other Ambulatory Visit: Payer: Self-pay

## 2019-10-30 ENCOUNTER — Ambulatory Visit
Payer: Medicaid Other | Attending: Student in an Organized Health Care Education/Training Program | Admitting: Student in an Organized Health Care Education/Training Program

## 2019-10-30 VITALS — BP 123/88 | HR 72 | Temp 98.1°F | Resp 20 | Ht 67.0 in | Wt 174.0 lb

## 2019-10-30 DIAGNOSIS — M48062 Spinal stenosis, lumbar region with neurogenic claudication: Secondary | ICD-10-CM | POA: Diagnosis not present

## 2019-10-30 DIAGNOSIS — G894 Chronic pain syndrome: Secondary | ICD-10-CM | POA: Insufficient documentation

## 2019-10-30 DIAGNOSIS — Z981 Arthrodesis status: Secondary | ICD-10-CM | POA: Diagnosis not present

## 2019-10-30 DIAGNOSIS — M47816 Spondylosis without myelopathy or radiculopathy, lumbar region: Secondary | ICD-10-CM | POA: Diagnosis not present

## 2019-10-30 DIAGNOSIS — M5416 Radiculopathy, lumbar region: Secondary | ICD-10-CM | POA: Insufficient documentation

## 2019-10-30 DIAGNOSIS — Z9889 Other specified postprocedural states: Secondary | ICD-10-CM | POA: Insufficient documentation

## 2019-10-30 DIAGNOSIS — M5137 Other intervertebral disc degeneration, lumbosacral region: Secondary | ICD-10-CM | POA: Diagnosis not present

## 2019-10-30 DIAGNOSIS — M961 Postlaminectomy syndrome, not elsewhere classified: Secondary | ICD-10-CM | POA: Diagnosis not present

## 2019-10-30 NOTE — Progress Notes (Signed)
PROVIDER NOTE: Information contained herein reflects review and annotations entered in association with encounter. Interpretation of such information and data should be left to medically-trained personnel. Information provided to patient can be located elsewhere in the medical record under "Patient Instructions". Document created using STT-dictation technology, any transcriptional errors that may result from process are unintentional.    Patient: Ryan [name]  Service Category: E/M  Provider: [name], [name]  DOB: [birth year]  DOS: 10/30/2019  Specialty: Interventional Pain Management  MRN: [id number]  Setting: Ambulatory outpatient  PCP: Olin Hauser, DO  Type: Established Patient    Referring Provider: [facility] [place]  Location: Office  Delivery: Face-to-face     HPI  Mr. Ryan [name], a 64 y.o. year old male, is here today because of his Lumbar spondylosis [M47.816]. Ryan [name] primary complain today is Back Pain (low) Last encounter: My last encounter with him was on 08/27/2019. Pertinent problems: Ryan [name] has DDD (degenerative disc disease), lumbosacral; Spinal stenosis, lumbar region, with neurogenic claudication; Lumbar radiculopathy; Lumbar facet arthropathy; Failed back surgical syndrome; and History of lumbar fusion on their pertinent problem list. Pain Assessment: Severity of Chronic pain is reported as a 7 /10. Location: Back Lower/sometimes radiates down legs. Onset: More than a month ago. Quality: Numbness, Burning, Tingling, Other (Comment) (stinging). Timing: Constant. Modifying factor(s): injections. Vitals:  height is '5\' 7"'  (1.702 m) and weight is 174 lb (78.9 kg). His temperature is 98.1 F (36.7 C). His blood pressure is 123/88 and his pulse is 72. His respiration is 20 and oxygen saturation is 100%.   Reason for encounter: worsening of previously known (established) problem   Low back pain that worsens with lumbar extension related to facet arthropathy and  lumbar spinal stenosis.  Patient finds symptomatic pain relief from lumbar facet medial branch nerve blocks bilaterally at L3, L4, L5.  Unfortunately we have tried lumbar radiofrequency ablation in the past which was not effective.  Future considerations would include sprint peripheral nerve stimulation of lumbar medial branch.  At this point, patient would like to pursue lumbar facet medial branch nerve blocks.  ROS  Constitutional: Denies any fever or chills Gastrointestinal: No reported hemesis, hematochezia, vomiting, or acute GI distress Musculoskeletal: Denies any acute onset joint swelling, redness, loss of ROM, or weakness Neurological: No reported episodes of acute onset apraxia, aphasia, dysarthria, agnosia, amnesia, paralysis, loss of coordination, or loss of consciousness  Medication Review  ALPRAZolam, acetaminophen, albuterol, aspirin, atorvastatin, cetirizine, gabapentin, lisinopril, nicotine, sildenafil, and tamsulosin  History Review  Allergy: Ryan [name] has No Known Allergies. Drug: Ryan [name]  reports no history of drug use. Alcohol:  reports no history of alcohol use. Tobacco:  reports that he has been smoking cigarettes. He has a 60.00 pack-year smoking history. He uses smokeless tobacco. Social: Ryan [name]  reports that he has been smoking cigarettes. He has a 60.00 pack-year smoking history. He uses smokeless tobacco. He reports that he does not drink alcohol and does not use drugs. Medical:  has a past medical history of Arthritis, Asthma, Chronic kidney disease, COPD (chronic obstructive pulmonary disease) ([place]), Dyspnea, GERD (gastroesophageal reflux disease), Hyperlipidemia, Hypertension, Lower urinary tract symptoms (LUTS), Prostate disease, Stroke [facility]), Thyroid disease, Wears dentures, and Wears hearing aid. Surgical: Ryan [name]  has a past surgical history that includes Eye surgery; Hernia repair; Transforaminal lumbar interbody fusion (tlif) with pedicle screw fixation 1  level (Right, 06/18/2014); Myringotomy with tube placement (Bilateral, 03/28/2015); Back surgery; Esophagogastroduodenoscopy (egd) with propofol (N/A,  10/24/2015); Cataract extraction w/PHACO (Left, 01/06/2016); Tonsillectomy; Colonoscopy with propofol (N/A, 04/19/2016); and Sinusotomy (02/28/2019). Family: family history includes Arthritis in his mother; Diabetes in his father; Hypertension in his father and mother.  Laboratory Chemistry Profile   Renal Lab Results  Component Value Date   BUN 17 09/11/2019   CREATININE 1.26 (H) 09/11/2019   GFRAA >60 09/11/2019   GFRNONAA >60 09/11/2019     Hepatic Lab Results  Component Value Date   AST 19 09/10/2019   ALT 15 09/10/2019   ALBUMIN 5.0 09/10/2019   ALKPHOS 133 (H) 09/10/2019   LIPASE 77 (H) 10/19/2017     Electrolytes Lab Results  Component Value Date   NA 133 (L) 09/11/2019   K 3.5 09/11/2019   CL 99 09/11/2019   CALCIUM 8.6 (L) 09/11/2019     Bone No results found for: VD25OH, VD125OH2TOT, QG9201EO7, HQ1975OI3, 25OHVITD1, 25OHVITD2, 25OHVITD3, TESTOFREE, TESTOSTERONE   Inflammation (CRP: Acute Phase) (ESR: Chronic Phase) No results found for: CRP, ESRSEDRATE, LATICACIDVEN     Note: Above Lab results reviewed.  Recent Imaging Review  ECHOCARDIOGRAM COMPLETE    ECHOCARDIOGRAM REPORT       Patient Name:   Ryan [name] Date of Exam: 09/11/2019 Medical Rec #:  [id number]    Height:       67.0 in Accession #:    [id number]   Weight:       163.0 lb Date of Birth:  1955-06-23   BSA:          1.854 m Patient Age:    14 years     BP:           126/85 mmHg Patient Gender: M            HR:           89 bpm. Exam Location:  [facility]  Procedure: 2D Echo, Color Doppler and Cardiac Doppler  Indications:     R55 Syncope   History:         Patient has no prior history of Echocardiogram examinations.                  COPD; Risk Factors:Hypertension, Dyslipidemia and Current                  Smoker.   Sonographer:     Charmayne Sheer RDCS  (AE) Referring Phys:  [id number] [place] Diagnosing Phys: Nelva Bush [name]    Sonographer Comments: Technically difficult study due to poor echo windows. Image acquisition challenging due to respiratory motion and Image acquisition challenging due to COPD. IMPRESSIONS   1. Left ventricular ejection fraction, by estimation, is >55%. The left ventricle has normal function. Left ventricular endocardial border not optimally defined to evaluate regional wall motion. Left ventricular diastolic parameters are indeterminate.  2. Right ventricular systolic function was not well visualized. The right ventricular size is not well visualized. Mildly increased right ventricular wall thickness. Tricuspid regurgitation signal is inadequate for assessing PA pressure.  3. The mitral valve is normal in structure. No evidence of mitral valve regurgitation. No evidence of mitral stenosis.  4. Unable to determine valve morphology due to image quality. Aortic valve regurgitation is not visualized. No aortic stenosis is present.  5. The inferior vena cava is normal in size with <50% respiratory variability, suggesting right atrial pressure of 8 mmHg.  FINDINGS  Left Ventricle: Left ventricular ejection fraction, by estimation, is >55%. The left ventricle has normal function. Left ventricular endocardial  border not optimally defined to evaluate regional wall motion. The left ventricular internal cavity size was  normal in size. There is no left ventricular hypertrophy. Left ventricular diastolic parameters are indeterminate.  Right Ventricle: The right ventricular size is not well visualized. Mildly increased right ventricular wall thickness. Right ventricular systolic function was not well visualized. Tricuspid regurgitation signal is inadequate for assessing PA pressure.  Left Atrium: Left atrial size was not well visualized.  Right Atrium: Right atrial size was not well visualized.  Pericardium: Trivial  pericardial effusion is present.  Mitral Valve: The mitral valve is normal in structure. No evidence of mitral valve regurgitation. No evidence of mitral valve stenosis. MV peak gradient, 1.6 mmHg. The mean mitral valve gradient is 1.0 mmHg.  Tricuspid Valve: The tricuspid valve is grossly normal. Tricuspid valve regurgitation is not demonstrated.  Aortic Valve: Unable to determine valve morphology due to image quality. Aortic valve regurgitation is not visualized. No aortic stenosis is present. Aortic valve mean gradient measures 3.0 mmHg. Aortic valve peak gradient measures 6.2 mmHg. Aortic valve  area, by VTI measures 1.20 cm.  Pulmonic Valve: The pulmonic valve was not well visualized. Pulmonic valve regurgitation is not visualized. No evidence of pulmonic stenosis.  Aorta: The aortic root is normal in size and structure.  Pulmonary Artery: The pulmonary artery is not well seen.  Venous: The inferior vena cava is normal in size with less than 50% respiratory variability, suggesting right atrial pressure of 8 mmHg.  IAS/Shunts: No atrial level shunt detected by color flow Doppler.    LEFT VENTRICLE PLAX 2D LVIDd:         4.51 cm  Diastology LVIDs:         3.11 cm  LV e' lateral:   5.11 cm/s LV PW:         1.00 cm  LV E/e' lateral: 12.3 LV IVS:        0.87 cm  LV e' medial:    8.81 cm/s LVOT diam:     2.10 cm  LV E/e' medial:  7.2 LV SV:         26 LV SV Index:   14 LVOT Area:     3.46 cm    LEFT ATRIUM         Index LA diam:    2.70 cm 1.46 cm/m  AORTIC VALVE                   PULMONIC VALVE AV Area (Vmax):    1.65 cm    PV Vmax:       1.05 m/s AV Area (Vmean):   1.51 cm    PV Vmean:      61.800 cm/s AV Area (VTI):     1.20 cm    PV VTI:        0.169 m AV Vmax:           124.00 cm/s PV Peak grad:  4.4 mmHg AV Vmean:          86.200 cm/s PV Mean grad:  2.0 mmHg AV VTI:            0.220 m AV Peak Grad:      6.2 mmHg AV Mean Grad:      3.0 mmHg LVOT Vmax:          59.10 cm/s LVOT Vmean:        37.600 cm/s LVOT VTI:          0.076  m LVOT/AV VTI ratio: 0.35   AORTA Ao Root diam: 2.70 cm  MITRAL VALVE MV Area (PHT): 3.48 cm    SHUNTS MV Peak grad:  1.6 mmHg    Systemic VTI:  0.08 m MV Mean grad:  1.0 mmHg    Systemic Diam: 2.10 cm MV Vmax:       0.64 m/s MV Vmean:      47.5 cm/s MV Decel Time: 218 msec MV E velocity: 63.10 cm/s MV A velocity: 73.20 cm/s MV E/A ratio:  0.86  Ryan [name] Electronically signed by Nelva Bush [name] Signature Date/Time: 09/11/2019/12:48:51 PM      Final   Note: Reviewed        Physical Exam  General appearance: Well nourished, well developed, and well hydrated. In no apparent acute distress Mental status: Alert, oriented x 3 (person, place, & time)       Respiratory: No evidence of acute respiratory distress Eyes: PERLA Vitals: BP 123/88   Pulse 72   Temp 98.1 F (36.7 C)   Resp 20   Ht '5\' 7"'  (1.702 m)   Wt 174 lb (78.9 kg)   SpO2 100%   BMI 27.25 kg/m  BMI: Estimated body mass index is 27.25 kg/m as calculated from the following:   Height as of this encounter: '5\' 7"'  (1.702 m).   Weight as of this encounter: 174 lb (78.9 kg). Ideal: Ideal body weight: 66.1 kg (145 lb 11.6 oz) Adjusted ideal body weight: 71.2 kg (157 lb 0.6 oz)   Lumbar Spine Area Exam  Skin & Axial Inspection: Well healed scar from previous spine surgery detected Alignment: Symmetrical Functional ROM: Improved after treatment however still positive for facet mediated pain with lumbar extension       Stability: No instability detected Muscle Tone/Strength: Functionally intact. No obvious neuro-muscular anomalies detected. Sensory (Neurological): Articular pain pattern  Provocative Tests: Hyperextension/rotation test: (+) bilaterally for facet joint pain. Lumbar quadrant test (Kemp's test): (+) bilaterally for facet joint pain.  Gait & Posture Assessment  Ambulation: Limited Gait: Antalgic gait  (limping) Posture: Difficulty standing up straight, due to pain  Lower Extremity Exam    Side: Right lower extremity  Side: Left lower extremity  Stability: No instability observed          Stability: No instability observed          Skin & Extremity Inspection: Skin color, temperature, and hair growth are WNL. No peripheral edema or cyanosis. No masses, redness, swelling, asymmetry, or associated skin lesions. No contractures.  Skin & Extremity Inspection: Skin color, temperature, and hair growth are WNL. No peripheral edema or cyanosis. No masses, redness, swelling, asymmetry, or associated skin lesions. No contractures.  Functional ROM: Pain restricted ROM for hip and knee joints          Functional ROM: Pain restricted ROM for hip and knee joints          Muscle Tone/Strength: Functionally intact. No obvious neuro-muscular anomalies detected.  Muscle Tone/Strength: Functionally intact. No obvious neuro-muscular anomalies detected.  Sensory (Neurological): Unimpaired        Sensory (Neurological): Unimpaired        DTR: Patellar: deferred today Achilles: deferred today Plantar: deferred today  DTR: Patellar: deferred today Achilles: deferred today Plantar: deferred today  Palpation: No palpable anomalies  Palpation: No palpable anomalies     Assessment   Status Diagnosis  Having a Flare-up Having a Flare-up Having a Flare-up 1. Lumbar spondylosis   2. Lumbar  facet arthropathy   3. Spinal stenosis, lumbar region, with neurogenic claudication   4. Failed back surgical syndrome   5. DDD (degenerative disc disease), lumbosacral   6. History of lumbar fusion   7. Lumbar radiculopathy (R L2/3)   8. Chronic pain syndrome   9. Status post lumbar surgery       Plan of Care   Ryan [name] has a current medication list which includes the following long-term medication(s): albuterol, atorvastatin, cetirizine, gabapentin, and lisinopril.  Continue gabapentin as  prescribed.  Proceed with lumbar facet medial branch nerve blocks as below.  Continue with home stretching exercises.  Orders:  Orders Placed This Encounter  Procedures  . LUMBAR FACET(MEDIAL BRANCH NERVE BLOCK) MBNB    Standing Status:   Future    Standing Expiration Date:   11/30/2019    Scheduling Instructions:     Procedure: Lumbar facet block (AKA.: Lumbosacral medial branch nerve block)     Side: Bilateral     Level: L3-4, L4-5, Facets (L3, L4, L5, Medial Branch Nerves)     Sedation: Patient's choice.     Timeframe: ASAA    Order Specific Question:   Where will this procedure be performed?    Answer:   [facility]   Follow-up plan:   Return in about 2 weeks (around 11/13/2019) for B/L L3, 4, 5 Facets , with sedation.     Status post bilateral L3, L4, L5, S1 lumbar facet medial branch nerve blocks on 03/20/2018 helped 85% pain relief for approximately 1 year. Status post left L3, L4, L5 RFA on 07/11/2019, Right L3, L4, L5 RFA 07/25/19. Not as helpful as lumbar facet MBBs- repeat PRN, consider SPRINT PNS        Recent Visits Date Type Provider Dept  10/08/19 Telemedicine [name], [name] [facility]  08/27/19 Procedure visit [name], [name] [facility]  08/15/19 Office Visit [name], [name] [facility]  Showing recent visits within past 90 days and meeting all other requirements Today's Visits Date Type Provider Dept  10/30/19 Office Visit [name], [name] [facility]  Showing today's visits and meeting all other requirements Future Appointments No visits were found meeting these conditions. Showing future appointments within next 90 days and meeting all other requirements  I discussed the assessment and treatment plan with the patient. The patient was provided an opportunity to ask questions and all were answered. The patient agreed with the plan and demonstrated an understanding of the instructions.  Patient advised to call  back or seek an in-person evaluation if the symptoms or condition worsens.  Duration of encounter: 70mnutes.  Note by: BGillis Santa [name] Date: 10/30/2019; Time: 8:48 AM

## 2019-10-30 NOTE — Progress Notes (Signed)
Safety precautions to be maintained throughout the outpatient stay will include: orient to surroundings, keep bed in low position, maintain call bell within reach at all times, provide assistance with transfer out of bed and ambulation.  

## 2019-10-30 NOTE — Patient Instructions (Signed)

## 2019-10-31 DIAGNOSIS — J310 Chronic rhinitis: Secondary | ICD-10-CM | POA: Diagnosis not present

## 2019-11-05 ENCOUNTER — Telehealth: Payer: Self-pay

## 2019-11-05 NOTE — Telephone Encounter (Signed)
Lets wait and resubmit when able

## 2019-11-05 NOTE — Telephone Encounter (Signed)
Per his insurance rep, he was denied for the lumbar facets on 10/15/19 and we have to wait 30 days to resubmit or you can do an appeal. The time frame for peer to peer is over. We can wait until 10/20 and I can resubmit this request.

## 2019-11-05 NOTE — Telephone Encounter (Signed)
Left patient a vm making him aware of what is going on.

## 2019-11-20 ENCOUNTER — Other Ambulatory Visit: Payer: Self-pay | Admitting: Family Medicine

## 2019-11-20 DIAGNOSIS — J432 Centrilobular emphysema: Secondary | ICD-10-CM

## 2019-11-20 MED ORDER — ALBUTEROL SULFATE HFA 108 (90 BASE) MCG/ACT IN AERS: 2.0000 | INHALATION_SPRAY | Freq: Four times a day (QID) | RESPIRATORY_TRACT | 2 refills | Status: DC | PRN

## 2019-11-23 DIAGNOSIS — F411 Generalized anxiety disorder: Secondary | ICD-10-CM | POA: Diagnosis not present

## 2019-11-23 DIAGNOSIS — F331 Major depressive disorder, recurrent, moderate: Secondary | ICD-10-CM | POA: Diagnosis not present

## 2019-12-03 ENCOUNTER — Ambulatory Visit
Admission: RE | Admit: 2019-12-03 | Discharge: 2019-12-03 | Disposition: A | Discharge status: Home / Self Care | Payer: Medicaid Other | Source: Ambulatory Visit | Attending: Student in an Organized Health Care Education/Training Program | Admitting: Student in an Organized Health Care Education/Training Program

## 2019-12-03 ENCOUNTER — Ambulatory Visit (HOSPITAL_BASED_OUTPATIENT_CLINIC_OR_DEPARTMENT_OTHER): Payer: Medicaid Other | Admitting: Student in an Organized Health Care Education/Training Program

## 2019-12-03 ENCOUNTER — Other Ambulatory Visit: Payer: Self-pay

## 2019-12-03 ENCOUNTER — Encounter: Payer: Self-pay | Admitting: Student in an Organized Health Care Education/Training Program

## 2019-12-03 DIAGNOSIS — G894 Chronic pain syndrome: Secondary | ICD-10-CM | POA: Diagnosis not present

## 2019-12-03 DIAGNOSIS — M47816 Spondylosis without myelopathy or radiculopathy, lumbar region: Secondary | ICD-10-CM

## 2019-12-03 DIAGNOSIS — M48062 Spinal stenosis, lumbar region with neurogenic claudication: Secondary | ICD-10-CM

## 2019-12-03 DIAGNOSIS — M4696 Unspecified inflammatory spondylopathy, lumbar region: Secondary | ICD-10-CM | POA: Diagnosis not present

## 2019-12-03 MED ORDER — ROPIVACAINE HCL 2 MG/ML IJ SOLN
9.0000 mL | Freq: Once | INTRAMUSCULAR | Status: AC
  Administered 2019-12-03: 9 mL via PERINEURAL

## 2019-12-03 MED ORDER — FENTANYL CITRATE (PF) 100 MCG/2ML IJ SOLN
INTRAMUSCULAR | Status: AC
  Filled 2019-12-03: qty 2

## 2019-12-03 MED ORDER — DEXAMETHASONE SODIUM PHOSPHATE 10 MG/ML IJ SOLN
INTRAMUSCULAR | Status: AC
  Filled 2019-12-03: qty 2

## 2019-12-03 MED ORDER — DEXAMETHASONE SODIUM PHOSPHATE 10 MG/ML IJ SOLN
10.0000 mg | Freq: Once | INTRAMUSCULAR | Status: AC
  Administered 2019-12-03: 10 mg

## 2019-12-03 MED ORDER — LIDOCAINE HCL 2 % IJ SOLN
INTRAMUSCULAR | Status: AC
  Filled 2019-12-03: qty 20

## 2019-12-03 MED ORDER — ROPIVACAINE HCL 2 MG/ML IJ SOLN
INTRAMUSCULAR | Status: AC
  Filled 2019-12-03: qty 20

## 2019-12-03 MED ORDER — LIDOCAINE HCL 2 % IJ SOLN
20.0000 mL | Freq: Once | INTRAMUSCULAR | Status: AC
  Administered 2019-12-03: 400 mg

## 2019-12-03 MED ORDER — FENTANYL CITRATE (PF) 100 MCG/2ML IJ SOLN
25.0000 ug | INTRAMUSCULAR | Status: AC | PRN
  Administered 2019-12-03: 25 ug via INTRAVENOUS
  Administered 2019-12-03: 50 ug via INTRAVENOUS
  Administered 2019-12-03: 25 ug via INTRAVENOUS

## 2019-12-03 NOTE — Progress Notes (Signed)
Patient's Name: Ryan [name]  MRN: [id number]  Referring Provider: Edward Jolly, MD  DOB: [birth year]  PCP: Smitty Cords, DO  DOS: 12/03/2019  Note by: Edward Jolly, MD  Service setting: Ambulatory outpatient  Specialty: Interventional Pain Management  Patient type: Established  Location: [facility] (AMB) Pain Management Facility  Visit type: Interventional Procedure   Primary Reason for Visit: Interventional Pain Management Treatment. CC: Back Pain (lumbar bilateral )  Procedure:          Anesthesia, Analgesia, Anxiolysis:  Type: Lumbar Facet, Medial Branch Block(s) #4  (#1 done 03/20/2018, #2 04/25/2019, RFA performed June 2021 which was not helpful, will plan for therapeutic facets MBB, #3 done 08/27/19) Primary Purpose: Therapeutic Region: Posterolateral Lumbosacral Spine Level: L3, L4, L5,  Medial Branch Level(s). Injecting these levels blocks the L3-4, L4-5 lumbar facet joints. Laterality: Bilateral  Type: Moderate (Conscious) Sedation combined with Local Anesthesia Indication(s): Analgesia and Anxiety Route: Intravenous (IV) IV Access: Secured Sedation: Meaningful verbal contact was maintained at all times during the procedure  Local Anesthetic: Lidocaine 1-2%  Position: Prone   Indications: 1. Lumbar spondylosis   2. Lumbar facet arthropathy   3. Spinal stenosis, lumbar region, with neurogenic claudication   4. Chronic pain syndrome    Pain Score: Pre-procedure: 7 /10 Post-procedure: 3 /10  Pre-op Assessment:  Mr. Holston is a 64 y.o. (year old), male patient, seen today for interventional treatment. He  has a past surgical history that includes Eye surgery; Hernia repair; Transforaminal lumbar interbody fusion (tlif) with pedicle screw fixation 1 level (Right, 06/18/2014); Myringotomy with tube placement (Bilateral, 03/28/2015); Back surgery; Esophagogastroduodenoscopy (egd) with propofol (N/A, 10/24/2015); Cataract extraction w/PHACO (Left, 01/06/2016); Tonsillectomy;  Colonoscopy with propofol (N/A, 04/19/2016); and Sinusotomy (02/28/2019). Mr. Rafferty has a current medication list which includes the following prescription(s): acetaminophen, albuterol, alprazolam, aspirin, atorvastatin, cetirizine, gabapentin, lisinopril, nicotine, sildenafil, and tamsulosin. His primarily concern today is the Back Pain (lumbar bilateral )  Initial Vital Signs:  Pulse/HCG Rate: 79  Temp: 98.4 F (36.9 C) Resp: 16 BP: 105/66 SpO2: 98 %  BMI: Estimated body mass index is 27.25 kg/m as calculated from the following:   Height as of this encounter: 5\' 7"  (1.702 m).   Weight as of this encounter: 174 lb (78.9 kg).  Risk Assessment: Allergies: Reviewed. He has No Known Allergies.  Allergy Precautions: None required Coagulopathies: Reviewed. None identified.  Blood-thinner therapy: None at this time Active Infection(s): Reviewed. None identified. Mr. Siefert is afebrile  Site Confirmation: Mr. Weatherall was asked to confirm the procedure and laterality before marking the site Procedure checklist: Completed Consent: Before the procedure and under the influence of no sedative(s), amnesic(s), or anxiolytics, the patient was informed of the treatment options, risks and possible complications. To fulfill our ethical and legal obligations, as recommended by the American Medical Association's Code of Ethics, I have informed the patient of my clinical impression; the nature and purpose of the treatment or procedure; the risks, benefits, and possible complications of the intervention; the alternatives, including doing nothing; the risk(s) and benefit(s) of the alternative treatment(s) or procedure(s); and the risk(s) and benefit(s) of doing nothing. The patient was provided information about the general risks and possible complications associated with the procedure. These may include, but are not limited to: failure to achieve desired goals, infection, bleeding, organ or nerve damage, allergic  reactions, paralysis, and death. In addition, the patient was informed of those risks and complications associated to Spine-related procedures, such as failure to decrease pain;  infection (i.e.: Meningitis, epidural or intraspinal abscess); bleeding (i.e.: epidural hematoma, subarachnoid hemorrhage, or any other type of intraspinal or peri-dural bleeding); organ or nerve damage (i.e.: Any type of peripheral nerve, nerve root, or spinal cord injury) with subsequent damage to sensory, motor, and/or autonomic systems, resulting in permanent pain, numbness, and/or weakness of one or several areas of the body; allergic reactions; (i.e.: anaphylactic reaction); and/or death. Furthermore, the patient was informed of those risks and complications associated with the medications. These include, but are not limited to: allergic reactions (i.e.: anaphylactic or anaphylactoid reaction(s)); adrenal axis suppression; blood sugar elevation that in diabetics may result in ketoacidosis or comma; water retention that in patients with history of congestive heart failure may result in shortness of breath, pulmonary edema, and decompensation with resultant heart failure; weight gain; swelling or edema; medication-induced neural toxicity; particulate matter embolism and blood vessel occlusion with resultant organ, and/or nervous system infarction; and/or aseptic necrosis of one or more joints. Finally, the patient was informed that Medicine is not an exact science; therefore, there is also the possibility of unforeseen or unpredictable risks and/or possible complications that may result in a catastrophic outcome. The patient indicated having understood very clearly. We have given the patient no guarantees and we have made no promises. Enough time was given to the patient to ask questions, all of which were answered to the patient's satisfaction. Mr. Bonaventure has indicated that he wanted to continue with the procedure. Attestation: I,  the ordering provider, attest that I have discussed with the patient the benefits, risks, side-effects, alternatives, likelihood of achieving goals, and potential problems during recovery for the procedure that I have provided informed consent. Date  Time: 12/03/2019 11:05 AM  Pre-Procedure Preparation:  Monitoring: As per clinic protocol. Respiration, ETCO2, SpO2, BP, heart rate and rhythm monitor placed and checked for adequate function Safety Precautions: Patient was assessed for positional comfort and pressure points before starting the procedure. Time-out: I initiated and conducted the "Time-out" before starting the procedure, as per protocol. The patient was asked to participate by confirming the accuracy of the "Time Out" information. Verification of the correct person, site, and procedure were performed and confirmed by me, the nursing staff, and the patient. "Time-out" conducted as per Joint Commission's Universal Protocol (UP.01.01.01). Time: 1139  Description of Procedure:          Laterality: Bilateral. The procedure was performed in identical fashion on both sides. Levels:  L3, L4, L5,  Medial Branch Level(s) Area Prepped: Posterior Lumbosacral Region Prepping solution: ChloraPrep (2% chlorhexidine gluconate and 70% isopropyl alcohol) Safety Precautions: Aspiration looking for blood return was conducted prior to all injections. At no point did we inject any substances, as a needle was being advanced. Before injecting, the patient was told to immediately notify me if he was experiencing any new onset of "ringing in the ears, or metallic taste in the mouth". No attempts were made at seeking any paresthesias. Safe injection practices and needle disposal techniques used. Medications properly checked for expiration dates. SDV (single dose vial) medications used. After the completion of the procedure, all disposable equipment used was discarded in the proper designated medical waste  containers. Local Anesthesia: Protocol guidelines were followed. The patient was positioned over the fluoroscopy table. The area was prepped in the usual manner. The time-out was completed. The target area was identified using fluoroscopy. A 12-in long, straight, sterile hemostat was used with fluoroscopic guidance to locate the targets for each level blocked. Once located, the  skin was marked with an approved surgical skin marker. Once all sites were marked, the skin (epidermis, dermis, and hypodermis), as well as deeper tissues (fat, connective tissue and muscle) were infiltrated with a Kronk amount of a short-acting local anesthetic, loaded on a 10cc syringe with a 25G, 1.5-in  Needle. An appropriate amount of time was allowed for local anesthetics to take effect before proceeding to the next step. Local Anesthetic: Lidocaine 2.0% The unused portion of the local anesthetic was discarded in the proper designated containers. Technical explanation of process:   L3 Medial Branch Nerve Block (MBB): The target area for the L3 medial branch is at the junction of the postero-lateral aspect of the superior articular process and the superior, posterior, and medial edge of the transverse process of L4. Under fluoroscopic guidance, a Quincke needle was inserted until contact was made with os over the superior postero-lateral aspect of the pedicular shadow (target area). After negative aspiration for blood,1 cc  mL of the nerve block solution was injected without difficulty or complication. The needle was removed intact. L4 Medial Branch Nerve Block (MBB): The target area for the L4 medial branch is at the junction of the postero-lateral aspect of the superior articular process and the superior, posterior, and medial edge of the transverse process of L5. Under fluoroscopic guidance, a Quincke needle was inserted until contact was made with os over the superior postero-lateral aspect of the pedicular shadow (target  area). After negative aspiration for blood,1cc mL of the nerve block solution was injected without difficulty or complication. The needle was removed intact. L5 Medial Branch Nerve Block (MBB): The target area for the L5 medial branch is at the junction of the postero-lateral aspect of the superior articular process and the superior, posterior, and medial edge of the sacral ala. Under fluoroscopic guidance, a Quincke needle was inserted until contact was made with os over the superior postero-lateral aspect of the pedicular shadow (target area). After negative aspiration for blood,1cc  mL of the nerve block solution was injected without difficulty or complication. The needle was removed intact.   Nerve block solution: 10 cc solution made of 8 cc of 0.2% ropivacaine, 2 cc of Decadron 10 mg/cc.  1-1.5 cc injected at each level above bilaterally  Procedural Needles: 22-gauge, 3.5-inch, Quincke needles used for all levels.  Once the entire procedure was completed, the treated area was cleaned, making sure to leave some of the prepping solution back to take advantage of its long term bactericidal properties.   Illustration of the posterior view of the lumbar spine and the posterior neural structures. Laminae of L2 through S1 are labeled. DPRL5, dorsal primary ramus of L5; DPRS1, dorsal primary ramus of S1; DPR3, dorsal primary ramus of L3; FJ, facet (zygapophyseal) joint L3-L4; I, inferior articular process of L4; LB1, lateral branch of dorsal primary ramus of L1; IAB, inferior articular branches from L3 medial branch (supplies L4-L5 facet joint); IBP, intermediate branch plexus; MB3, medial branch of dorsal primary ramus of L3; NR3, third lumbar nerve root; S, superior articular process of L5; SAB, superior articular branches from L4 (supplies L4-5 facet joint also); TP3, transverse process of L3.  Vitals:   12/03/19 1135 12/03/19 1140 12/03/19 1145 12/03/19 1150  BP: 137/78 119/80 114/78 108/76  Pulse:  76 74 80 78  Resp:      Temp:      TempSrc:      SpO2: 96% 98% 100% 98%  Weight:      Height:  Start Time: 1139 hrs. End Time: 1148 hrs.  Imaging Guidance (Spinal):          Type of Imaging Technique: Fluoroscopy Guidance (Spinal) Indication(s): Assistance in needle guidance and placement for procedures requiring needle placement in or near specific anatomical locations not easily accessible without such assistance. Exposure Time: Please see nurses notes. Contrast: None used. Fluoroscopic Guidance: I was personally present during the use of fluoroscopy. "Tunnel Vision Technique" used to obtain the best possible view of the target area. Parallax error corrected before commencing the procedure. "Direction-depth-direction" technique used to introduce the needle under continuous pulsed fluoroscopy. Once target was reached, antero-posterior, oblique, and lateral fluoroscopic projection used confirm needle placement in all planes. Images permanently stored in EMR. Interpretation: No contrast injected. I personally interpreted the imaging intraoperatively. Adequate needle placement confirmed in multiple planes. Permanent images saved into the patient's record.  Antibiotic Prophylaxis:   Anti-infectives (From admission, onward)   None     Indication(s): None identified  Post-operative Assessment:  Post-procedure Vital Signs:  Pulse/HCG Rate: 78 (nsr)  Temp: 98.4 F (36.9 C) Resp: 16 BP: 108/76 SpO2: 98 %  EBL: None  Complications: No immediate post-treatment complications observed by team, or reported by patient.  Note: The patient tolerated the entire procedure well. A repeat set of vitals were taken after the procedure and the patient was kept under observation following institutional policy, for this type of procedure. Post-procedural neurological assessment was performed, showing return to baseline, prior to discharge. The patient was provided with post-procedure discharge  instructions, including a section on how to identify potential problems. Should any problems arise concerning this procedure, the patient was given instructions to immediately contact us, at any time, without hesitation. In any case, we plan to contact the patient by telephone for a follow-up status report regarding this interventional procedure.  Comments:  No additional relevant information.  Plan of Care    Imaging Orders     DG PAIN CLINIC C-ARM 1-60 MIN NO REPORT  Medications ordered for procedure: Meds ordered this encounter  Medications  . lidocaine (XYLOCAINE) 2 % (with pres) injection 400 mg  . fentaNYL (SUBLIMAZE) injection 25-50 mcg    Make sure Narcan is available in the pyxis when using this medication. In the event of respiratory depression (RR< 8/min): Titrate NARCAN (naloxone) in increments of 0.1 to 0.2 mg IV at 2-3 minute intervals, until desired degree of reversal.  . ropivacaine (PF) 2 mg/mL (0.2%) (NAROPIN) injection 9 mL  . ropivacaine (PF) 2 mg/mL (0.2%) (NAROPIN) injection 9 mL  . dexamethasone (DECADRON) injection 10 mg  . dexamethasone (DECADRON) injection 10 mg   Medications administered: We administered lidocaine, fentaNYL, ropivacaine (PF) 2 mg/mL (0.2%), ropivacaine (PF) 2 mg/mL (0.2%), dexamethasone, and dexamethasone.  See the medical record for exact dosing, route, and time of administration.  Disposition: Discharge home  Discharge Date & Time: 12/03/2019;   hrs.   Physician-requested Follow-up: Return in about 9 weeks (around 02/04/2020) for Post Procedure Evaluation, virtual.  Future Appointments  Date Time Provider Department Center  01/24/2020 11:20 AM Smitty Cords, DO [facility]  01/28/2020  3:00 PM Edward Jolly, MD [facility] None   Primary Care Physician: Smitty Cords, DO Location: [facility] Note by: Edward Jolly, MD Date: 12/03/2019; Time: 12:03 PM  Disclaimer:  Medicine is not an  exact science. The only guarantee in medicine is that nothing is guaranteed. It is important to note that the decision to proceed with this  intervention was based on the information collected from the patient. The Data and conclusions were drawn from the patient's questionnaire, the interview, and the physical examination. Because the information was provided in large part by the patient, it cannot be guaranteed that it has not been purposely or unconsciously manipulated. Every effort has been made to obtain as much relevant data as possible for this evaluation. It is important to note that the conclusions that lead to this procedure are derived in large part from the available data. Always take into account that the treatment will also be dependent on availability of resources and existing treatment guidelines, considered by other Pain Management Practitioners as being common knowledge and practice, at the time of the intervention. For Medico-Legal purposes, it is also important to point out that variation in procedural techniques and pharmacological choices are the acceptable norm. The indications, contraindications, technique, and results of the above procedure should only be interpreted and judged by a Board-Certified Interventional Pain Specialist with extensive familiarity and expertise in the same exact procedure and technique.

## 2019-12-03 NOTE — Patient Instructions (Signed)
____________________________________________________________________________________________  Post-Procedure Discharge Instructions  Instructions:  Apply ice:   Purpose: This will minimize any swelling and discomfort after procedure.   When: Day of procedure, as soon as you get home.  How: Fill a plastic sandwich bag with crushed ice. Cover it with a Skerritt towel and apply to injection site.  How long: (15 min on, 15 min off) Apply for 15 minutes then remove x 15 minutes.  Repeat sequence on day of procedure, until you go to bed.  Apply heat:   Purpose: To treat any soreness and discomfort from the procedure.  When: Starting the next day after the procedure.  How: Apply heat to procedure site starting the day following the procedure.  How long: May continue to repeat daily, until discomfort goes away.  Food intake: Start with clear liquids (like water) and advance to regular food, as tolerated.   Physical activities: Keep activities to a minimum for the first 8 hours after the procedure. After that, then as tolerated.  Driving: If you have received any sedation, be responsible and do not drive. You are not allowed to drive for 24 hours after having sedation.  Blood thinner: (Applies only to those taking blood thinners) You may restart your blood thinner 6 hours after your procedure.  Insulin: (Applies only to Diabetic patients taking insulin) As soon as you can eat, you may resume your normal dosing schedule.  Infection prevention: Keep procedure site clean and dry. Shower daily and clean area with soap and water.  Post-procedure Pain Diary: Extremely important that this be done correctly and accurately. Recorded information will be used to determine the next step in treatment. For the purpose of accuracy, follow these rules:  Evaluate only the area treated. Do not report or include pain from an untreated area. For the purpose of this evaluation, ignore all other areas of pain,  except for the treated area.  After your procedure, avoid taking a long nap and attempting to complete the pain diary after you wake up. Instead, set your alarm clock to go off every hour, on the hour, for the initial 8 hours after the procedure. Document the duration of the numbing medicine, and the relief you are getting from it.  Do not go to sleep and attempt to complete it later. It will not be accurate. If you received sedation, it is likely that you were given a medication that may cause amnesia. Because of this, completing the diary at a later time may cause the information to be inaccurate. This information is needed to plan your care.  Follow-up appointment: Keep your post-procedure follow-up evaluation appointment after the procedure (usually 2 weeks for most procedures, 6 weeks for radiofrequencies). DO NOT FORGET to bring you pain diary with you.   Expect: (What should I expect to see with my procedure?)  From numbing medicine (AKA: Local Anesthetics): Numbness or decrease in pain. You may also experience some weakness, which if present, could last for the duration of the local anesthetic.  Onset: Full effect within 15 minutes of injected.  Duration: It will depend on the type of local anesthetic used. On the average, 1 to 8 hours.   From steroids (Applies only if steroids were used): Decrease in swelling or inflammation. Once inflammation is improved, relief of the pain will follow.  Onset of benefits: Depends on the amount of swelling present. The more swelling, the longer it will take for the benefits to be seen. In some cases, up to 10 days.    Duration: Steroids will stay in the system x 2 weeks. Duration of benefits will depend on multiple posibilities including persistent irritating factors.  Side-effects: If present, they may typically last 2 weeks (the duration of the steroids).  Frequent: Cramps (if they occur, drink Gatorade and take over-the-counter Magnesium 450-500 mg  once to twice a day); water retention with temporary weight gain; increases in blood sugar; decreased immune system response; increased appetite.  Occasional: Facial flushing (red, warm cheeks); mood swings; menstrual changes.  Uncommon: Long-term decrease or suppression of natural hormones; bone thinning. (These are more common with higher doses or more frequent use. This is why we prefer that our patients avoid having any injection therapies in other practices.)   Very Rare: Severe mood changes; psychosis; aseptic necrosis.  From procedure: Some discomfort is to be expected once the numbing medicine wears off. This should be minimal if ice and heat are applied as instructed.  Call if: (When should I call?)  You experience numbness and weakness that gets worse with time, as opposed to wearing off.  New onset bowel or bladder incontinence. (Applies only to procedures done in the spine)  Emergency Numbers:  Durning business hours (Monday - Thursday, 8:00 AM - 4:00 PM) (Friday, 9:00 AM - 12:00 Noon): [phone]  After hours: [phone]  NOTE: If you are having a problem and are unable connect with, or to talk to a provider, then go to your nearest urgent care or emergency department. If the problem is serious and urgent, please call 911. ____________________________________________________________________________________________    

## 2019-12-03 NOTE — Progress Notes (Signed)
Safety precautions to be maintained throughout the outpatient stay will include: orient to surroundings, keep bed in low position, maintain call bell within reach at all times, provide assistance with transfer out of bed and ambulation.  

## 2019-12-04 ENCOUNTER — Telehealth: Payer: Self-pay

## 2019-12-04 NOTE — Telephone Encounter (Signed)
Called pp. No answer, left message to call if needed. °

## 2019-12-14 DIAGNOSIS — F411 Generalized anxiety disorder: Secondary | ICD-10-CM | POA: Diagnosis not present

## 2019-12-14 DIAGNOSIS — F341 Dysthymic disorder: Secondary | ICD-10-CM | POA: Diagnosis not present

## 2020-01-22 ENCOUNTER — Ambulatory Visit: Payer: Medicaid Other | Admitting: Family Medicine

## 2020-01-22 DIAGNOSIS — H2513 Age-related nuclear cataract, bilateral: Secondary | ICD-10-CM | POA: Diagnosis not present

## 2020-01-23 ENCOUNTER — Telehealth: Payer: Self-pay | Admitting: *Deleted

## 2020-01-23 NOTE — Telephone Encounter (Signed)
Called patient for pre visit assessment. No answer. LVM.

## 2020-01-24 ENCOUNTER — Other Ambulatory Visit: Payer: Self-pay

## 2020-01-24 ENCOUNTER — Ambulatory Visit (INDEPENDENT_AMBULATORY_CARE_PROVIDER_SITE_OTHER): Payer: Medicaid Other | Admitting: Family Medicine

## 2020-01-24 ENCOUNTER — Encounter: Payer: Self-pay | Admitting: Family Medicine

## 2020-01-24 ENCOUNTER — Encounter: Payer: Self-pay | Admitting: Student in an Organized Health Care Education/Training Program

## 2020-01-24 VITALS — BP 158/86 | HR 69 | Temp 97.6°F | Resp 16 | Ht 68.0 in | Wt 172.0 lb

## 2020-01-24 DIAGNOSIS — H9191 Unspecified hearing loss, right ear: Secondary | ICD-10-CM | POA: Diagnosis not present

## 2020-01-24 DIAGNOSIS — M25561 Pain in right knee: Secondary | ICD-10-CM

## 2020-01-24 DIAGNOSIS — M25461 Effusion, right knee: Secondary | ICD-10-CM | POA: Diagnosis not present

## 2020-01-24 DIAGNOSIS — M1711 Unilateral primary osteoarthritis, right knee: Secondary | ICD-10-CM | POA: Diagnosis not present

## 2020-01-24 DIAGNOSIS — H9113 Presbycusis, bilateral: Secondary | ICD-10-CM

## 2020-01-24 NOTE — Progress Notes (Signed)
Subjective:    Patient ID: Ryan [name], male    DOB: Dec 23, 1955, 64 y.o.   MRN: [id number]  Ryan [name] is a 64 y.o. male presenting on 01/24/2020 for Hypertension and Knee Pain (Sharp stabbing pain Onset 6 day and referral for hearing loss for a while more on right side has hearing aid on Left side)   HPI   Right Knee Pain / Swelling, Twisting Injury Reports acute onset 6 days ago he was walking dog and they took off and it twisted him and knocked him off his feet, he did not have a traumatic fall, but it caused a loud "pop" and twisted his Right knee, hurts on both sides inner and outside with significant swelling. He still has some popping, he had mild bruising on inner side of knee. He is using cane to ambulate, he has some crutches, he tried a knee sleeve compression without relief has not tried ice. He took ibuprofen and gabapentin without relief. - he had history of prior fall with knee injury in past, but not recently it was not bothering him much after that but now this is most severe knee pain. He has not had significant locking of knee.  Hearing Loss, R worse Bilateral presbycusis Chronic issue had seen [facility] in past, Dr Jenne Campus. He has had L hearing aid in place with good results, but now R is worsening. Request return for re-evaluation  Depression screen [facility] 2/9 01/24/2020 10/30/2019 10/23/2019  Decreased Interest 0 0 0  Down, Depressed, Hopeless 0 0 1  PHQ - 2 Score 0 0 1  Altered sleeping - - 0  Tired, decreased energy - - 0  Change in appetite - - 0  Feeling bad or failure about yourself  - - 0  Trouble concentrating - - 0  Moving slowly or fidgety/restless - - 0  Suicidal thoughts - - 0  PHQ-9 Score - - 1  Difficult doing work/chores - - Not difficult at all    Social History   Tobacco Use  . Smoking status: Current Every Day Smoker    Packs/day: 1.50    Years: 40.00    Pack years: 60.00    Types: Cigarettes  . Smokeless tobacco: Current User  .  Tobacco comment: has cut back to 1PPD  Substance Use Topics  . Alcohol use: Yes    Alcohol/week: 0.0 standard drinks    Comment: ONCE OR TWICE A YR  . Drug use: No    Review of Systems Per HPI unless specifically indicated above     Objective:    BP (!) 158/86   Pulse 69   Temp 97.6 F (36.4 C)   Resp 16   Ht 5\' 8"  (1.727 m)   Wt 172 lb (78 kg)   SpO2 98%   BMI 26.15 kg/m   Wt Readings from Last 3 Encounters:  01/24/20 172 lb (78 kg)  12/03/19 174 lb (78.9 kg)  10/30/19 174 lb (78.9 kg)    Physical Exam Vitals and nursing note reviewed.  Constitutional:      General: He is not in acute distress.    Appearance: He is well-developed and well-nourished. He is not diaphoretic.     Comments: Well-appearing, comfortable, cooperative  HENT:     Head: Normocephalic and atraumatic.     Mouth/Throat:     Mouth: Oropharynx is clear and moist.  Eyes:     General:        Right eye: No  discharge.        Left eye: No discharge.     Conjunctiva/sclera: Conjunctivae normal.  Cardiovascular:     Rate and Rhythm: Normal rate.  Pulmonary:     Effort: Pulmonary effort is normal.  Musculoskeletal:        General: No edema.     Comments: Right Knee Inspection: significant swelling effusion R knee without ecchymosis or erythema. Asymmetrical Palpation: Tender bilateral knee inner and outer. Worse inner aspect. No obvious crepitus in joint ROM: Reduced extension, intact flexion Special Testing: Lachman / Valgus/Varus tests negative with intact ligaments (ACL, MCL, LCL) but did reproduce pain. Standing Thessaly meniscus test reproduced significant pain but no instability, worse internal  Strength: 5/5 intact knee flex/ext, ankle dorsi/plantarflex Neurovascular: distally intact sensation light touch and pulses   Skin:    General: Skin is warm and dry.     Findings: No erythema or rash.  Neurological:     Mental Status: He is alert and oriented to person, place, and time.   Psychiatric:        Mood and Affect: Mood and affect normal.        Behavior: Behavior normal.     Comments: Well groomed, good eye contact, normal speech and thoughts    Results for orders placed or performed during the hospital encounter of 09/10/19  SARS Coronavirus 2 by RT PCR (hospital order, performed in [facility] hospital lab) Nasopharyngeal Nasopharyngeal Swab   Specimen: Nasopharyngeal Swab  Result Value Ref Range   SARS Coronavirus 2 NEGATIVE NEGATIVE  CBC with Differential  Result Value Ref Range   WBC 19.0 (H) 4.0 - 10.5 K/uL   RBC 5.98 (H) 4.22 - 5.81 MIL/uL   Hemoglobin 18.1 (H) 13.0 - 17.0 g/dL   HCT 78.2 (H) 95.6 - 21.3 %   MCV 89.1 80.0 - 100.0 fL   MCH 30.3 26.0 - 34.0 pg   MCHC 34.0 30.0 - 36.0 g/dL   RDW 08.6 57.8 - 46.9 %   Platelets 293 150 - 400 K/uL   nRBC 0.0 0.0 - 0.2 %   Neutrophils Relative % 72 %   Neutro Abs 14.0 (H) 1.7 - 7.7 K/uL   Lymphocytes Relative 19 %   Lymphs Abs 3.6 0.7 - 4.0 K/uL   Monocytes Relative 6 %   Monocytes Absolute 1.1 (H) 0.1 - 1.0 K/uL   Eosinophils Relative 1 %   Eosinophils Absolute 0.1 0.0 - 0.5 K/uL   Basophils Relative 1 %   Basophils Absolute 0.1 0.0 - 0.1 K/uL   Immature Granulocytes 1 %   Abs Immature Granulocytes 0.11 (H) 0.00 - 0.07 K/uL  Comprehensive metabolic panel  Result Value Ref Range   Sodium 137 135 - 145 mmol/L   Potassium 3.9 3.5 - 5.1 mmol/L   Chloride 102 98 - 111 mmol/L   CO2 20 (L) 22 - 32 mmol/L   Glucose, Bld 122 (H) 70 - 99 mg/dL   BUN 14 8 - 23 mg/dL   Creatinine, Ser 6.29 0.61 - 1.24 mg/dL   Calcium 9.3 8.9 - 52.8 mg/dL   Total Protein 8.3 (H) 6.5 - 8.1 g/dL   Albumin 5.0 3.5 - 5.0 g/dL   AST 19 15 - 41 U/L   ALT 15 0 - 44 U/L   Alkaline Phosphatase 133 (H) 38 - 126 U/L   Total Bilirubin 1.2 0.3 - 1.2 mg/dL   GFR calc non Af Amer >60 >60 mL/min   GFR calc Af Amer >  60 >60 mL/min   Anion gap 15 5 - 15  Ethanol  Result Value Ref Range   Alcohol, Ethyl (B) <10 <10 mg/dL   Protime-INR  Result Value Ref Range   Prothrombin Time 13.2 11.4 - 15.2 seconds   INR 1.0 0.8 - 1.2  APTT  Result Value Ref Range   aPTT 37 (H) 24 - 36 seconds  Lipid panel  Result Value Ref Range   Cholesterol 185 0 - 200 mg/dL   Triglycerides 161122 <096<150 mg/dL   HDL 38 (L) >04>40 mg/dL   Total CHOL/HDL Ratio 4.9 RATIO   VLDL 24 0 - 40 mg/dL   LDL Cholesterol 540123 (H) 0 - 99 mg/dL  Hemoglobin J8JA1c  Result Value Ref Range   Hgb A1c MFr Bld 6.0 (H) 4.8 - 5.6 %   Mean Plasma Glucose 125.5 mg/dL  TSH  Result Value Ref Range   TSH 0.847 0.350 - 4.500 uIU/mL  CBC  Result Value Ref Range   WBC 14.8 (H) 4.0 - 10.5 K/uL   RBC 5.23 4.22 - 5.81 MIL/uL   Hemoglobin 16.2 13.0 - 17.0 g/dL   HCT 19.146.1 47.839.0 - 29.552.0 %   MCV 88.1 80.0 - 100.0 fL   MCH 31.0 26.0 - 34.0 pg   MCHC 35.1 30.0 - 36.0 g/dL   RDW 62.113.9 30.811.5 - 65.715.5 %   Platelets 242 150 - 400 K/uL   nRBC 0.0 0.0 - 0.2 %  Basic metabolic panel  Result Value Ref Range   Sodium 133 (L) 135 - 145 mmol/L   Potassium 3.5 3.5 - 5.1 mmol/L   Chloride 99 98 - 111 mmol/L   CO2 22 22 - 32 mmol/L   Glucose, Bld 155 (H) 70 - 99 mg/dL   BUN 17 8 - 23 mg/dL   Creatinine, Ser 8.461.26 (H) 0.61 - 1.24 mg/dL   Calcium 8.6 (L) 8.9 - 10.3 mg/dL   GFR calc non Af Amer >60 >60 mL/min   GFR calc Af Amer >60 >60 mL/min   Anion gap 12 5 - 15  HIV Antibody (routine testing w rflx)  Result Value Ref Range   HIV Screen 4th Generation wRfx Non Reactive Non Reactive  Glucose, capillary  Result Value Ref Range   Glucose-Capillary 134 (H) 70 - 99 mg/dL  ECHOCARDIOGRAM COMPLETE  Result Value Ref Range   Weight 2,608 oz   Height 67 in   BP 126/85 mmHg   Ao pk vel 1.24 m/s   AV Area VTI 1.20 cm2   AR max vel 1.65 cm2   AV Mean grad 3.0 mmHg   AV Peak grad 6.2 mmHg   S' Lateral 3.11 cm   AV Area mean vel 1.51 cm2   Area-P 1/2 3.48 cm2  Troponin I (High Sensitivity)  Result Value Ref Range   Troponin I (High Sensitivity) 5 <18 ng/L      Assessment &  Plan:   Problem List Items Addressed This Visit   None   Visit Diagnoses    Pain and swelling of right knee    -  Primary   Relevant Orders   Ambulatory referral to Orthopedic Surgery   Hearing reduced, right       Relevant Orders   Ambulatory referral to Audiology   Presbycusis of both ears       Relevant Orders   Ambulatory referral to Audiology      Acute R medial vs generalized Knee pain and swelling with known twisting acute  injury 6 days ago No traumatic fall Past history of falls and knee injury but no confirmed diagnosis in past Osteoarthritis multiple joints known  Concern for meniscus vs ligament injury given acute twisting injury mechanism, especially with persistent pain/swelling, exam today is supportive of possible mensicus injury  - Able to bear weight, no knee instability or locking but he has significant limitation in ambulation due to pain, using cane / crutches - No prior history of knee surgery, arthroscopy  Plan: 1. Urgent referral to [facility] today. Gave him walk in hours if he cannot wait until apt scheduled.   UPDATE - He was scheduled for TODAY Thurs 12/30 at 3:00pm at [facility] through their Urgent Care, but it is still an appointment.  may warrant further imaging x-ray vs future MRI  2. Use supportive care, RICE therapy, compression, avoid significant weight bearing, Tylenol / NSAID PRN, on gabapentin  --------------------------  #Hearing loss Chronic problem, gradual worse, now R worsening Hearing aid L ear Referral to [facility]   Orders Placed This Encounter  Procedures  . Ambulatory referral to Orthopedic Surgery    Referral Priority:   Urgent    Referral Type:   Surgical    Referral Reason:   Specialty Services Required    Requested Specialty:   Orthopedic Surgery    Number of Visits Requested:   1  . Ambulatory referral to Audiology    Referral Priority:   Routine    Referral Type:   Audiology  Exam    Referral Reason:   Specialty Services Required    Number of Visits Requested:   1     No orders of the defined types were placed in this encounter.     Follow up plan: Return if symptoms worsen or fail to improve.   Saralyn Pilar, DO [facility] 01/24/2020, 11:57 AM

## 2020-01-24 NOTE — Patient Instructions (Addendum)
Thank you for coming to the office today.  [facility] referral placed, they should call you soon.   Urgent referral placed today. If you can;t wait can go there now during walk in hours. Otherwise they will call and schedule you soon  [facility] Address: [address] [place], [place] [address]  Phone: [phone]   Open now Thursday 8AM-9PM  Friday [place] Eve) 8:30AM-4:30PM Holiday hours  Saturday ([place] Year's Day) 8:30AM-4:30PM Holiday hours  Sunday 9AM-9PM  Monday 8AM-9PM  Tuesday 8AM-9PM  Wednesday 8AM-9PM   Please schedule a Follow-up Appointment to: Return if symptoms worsen or fail to improve.  If you have any other questions or concerns, please feel free to call the office or send a message through MyChart. You may also schedule an earlier appointment if necessary.  Additionally, you may be receiving a survey about your experience at our office within a few days to 1 week by e-mail or mail. We value your feedback.  Saralyn Pilar, DO [facility], [place]

## 2020-01-28 ENCOUNTER — Encounter: Payer: Self-pay | Admitting: Student in an Organized Health Care Education/Training Program

## 2020-01-28 ENCOUNTER — Ambulatory Visit
Payer: Medicaid Other | Attending: Student in an Organized Health Care Education/Training Program | Admitting: Student in an Organized Health Care Education/Training Program

## 2020-01-28 ENCOUNTER — Other Ambulatory Visit: Payer: Self-pay

## 2020-01-28 DIAGNOSIS — M5137 Other intervertebral disc degeneration, lumbosacral region: Secondary | ICD-10-CM

## 2020-01-28 DIAGNOSIS — G894 Chronic pain syndrome: Secondary | ICD-10-CM

## 2020-01-28 DIAGNOSIS — M961 Postlaminectomy syndrome, not elsewhere classified: Secondary | ICD-10-CM

## 2020-01-28 DIAGNOSIS — M48062 Spinal stenosis, lumbar region with neurogenic claudication: Secondary | ICD-10-CM | POA: Diagnosis not present

## 2020-01-28 DIAGNOSIS — M47816 Spondylosis without myelopathy or radiculopathy, lumbar region: Secondary | ICD-10-CM | POA: Diagnosis not present

## 2020-01-28 NOTE — Progress Notes (Signed)
Patient: Ryan [name]  Service Category: E/M  Provider: [name], MD  DOB: 1955/10/06  DOS: 01/28/2020  Location: Office  MRN: [id number]  Setting: Ambulatory outpatient  Referring Provider: Nobie [name] *  Type: Established Patient  Specialty: Interventional Pain Management  PCP: Ryan Hauser, DO  Location: Home  Delivery: TeleHealth     Virtual Encounter - Pain Management PROVIDER NOTE: Information contained herein reflects review and annotations entered in association with encounter. Interpretation of such information and data should be left to medically-trained personnel. Information provided to patient can be located elsewhere in the medical record under "Patient Instructions". Document created using STT-dictation technology, any transcriptional errors that may result from process are unintentional.    Contact & Pharmacy Preferred: [phone] Home: [phone] (home) Mobile: There is no such number on file (mobile). E-mail: [email or web address]  [facility] [address] ([address]), [place] [address] [place])  [address] Phone: [phone] Fax: [phone]   Pre-screening  Ryan [name] offered "in-person" vs "virtual" encounter. He indicated preferring virtual for this encounter.   Reason COVID-19*  Social distancing based on CDC and AMA recommendations.   I contacted Ryan [name] on 01/28/2020 via telephone.      I clearly identified myself as 14/03/2020, MD. I verified that I was speaking with the correct person using two identifiers (Name: Ryan [name], and date of birth: 1955-03-06).  Consent I sought verbal advanced consent from 65/31/1957 for virtual visit interactions. I informed Ryan [name] of possible security and privacy concerns, risks, and limitations associated with providing "not-in-person" medical evaluation and management services. I also informed Ryan [name] of the availability of "in-person"  appointments. Finally, I informed him that there would be a charge for the virtual visit and that he could be  personally, fully or partially, financially responsible for it. Ryan [name] expressed understanding and agreed to proceed.   Historic Elements   Ryan [name] is a 65 y.o. year old, male patient evaluated today after our last contact on 12/03/2019. Ryan [name]  has a past medical history of Arthritis, Asthma, Chronic kidney disease, COPD (chronic obstructive pulmonary disease) ([place]), Dyspnea, GERD (gastroesophageal reflux disease), Hyperlipidemia, Hypertension, Lower urinary tract symptoms (LUTS), Prostate disease, Stroke [facility]), Thyroid disease, Wears dentures, and Wears hearing aid. He also  has a past surgical history that includes Eye surgery; Hernia repair; Transforaminal lumbar interbody fusion (tlif) with pedicle screw fixation 1 level (Right, 06/18/2014); Myringotomy with tube placement (Bilateral, 03/28/2015); Back surgery; Esophagogastroduodenoscopy (egd) with propofol (N/A, 10/24/2015); Cataract extraction w/PHACO (Left, 01/06/2016); Tonsillectomy; Colonoscopy with propofol (N/A, 04/19/2016); and Sinusotomy (02/28/2019). Ryan [name] has a current medication list which includes the following prescription(s): acetaminophen, albuterol, alprazolam, aspirin, atorvastatin, cetirizine, gabapentin, lisinopril, lisinopril, nicotine, sildenafil, and tamsulosin. He  reports that he has been smoking cigarettes. He has a 60.00 pack-year smoking history. He uses smokeless tobacco. He reports current alcohol use. He reports that he does not use drugs. Ryan [name] has No Known Allergies.   HPI  Today, he is being contacted for a post-procedure assessment.   Post-Procedure Evaluation  Procedure (12/03/2019):  Type: Lumbar Facet, Medial Branch Block(s) #4  (#1 done 03/20/2018, #2 04/25/2019, RFA performed June 2021 which was not helpful, will plan for therapeutic facets MBB, #3 done 08/27/19) Primary Purpose:  Therapeutic Region: Posterolateral Lumbosacral Spine Level: L3, L4, L5,  Medial Branch Level(s). Injecting these levels blocks the L3-4, L4-5 lumbar facet joints. Laterality: Bilateral  Sedation:  Please see nurses note.  Effectiveness during initial hour after procedure(Ultra-Short Term Relief): 100 %   Local anesthetic used: Long-acting (4-6 hours) Effectiveness: Defined as any analgesic benefit obtained secondary to the administration of local anesthetics. This carries significant diagnostic value as to the etiological location, or anatomical origin, of the pain. Duration of benefit is expected to coincide with the duration of the local anesthetic used.  Effectiveness during initial 4-6 hours after procedure(Short-Term Relief): 100 %   Long-term benefit: Defined as any relief past the pharmacologic duration of the local anesthetics.  Effectiveness past the initial 6 hours after procedure(Long-Term Relief): 50 % (5 weeks)   Current benefits: Defined as benefit that persist at this time.   Analgesia:  <50% better Function: Back to baseline ROM: Back to baseline   Laboratory Chemistry Profile   Renal Lab Results  Component Value Date   BUN 17 09/11/2019   CREATININE 1.26 (H) 09/11/2019   GFRAA >60 09/11/2019   GFRNONAA >60 09/11/2019     Hepatic Lab Results  Component Value Date   AST 19 09/10/2019   ALT 15 09/10/2019   ALBUMIN 5.0 09/10/2019   ALKPHOS 133 (H) 09/10/2019   LIPASE 77 (H) 10/19/2017     Electrolytes Lab Results  Component Value Date   NA 133 (L) 09/11/2019   K 3.5 09/11/2019   CL 99 09/11/2019   CALCIUM 8.6 (L) 09/11/2019     Bone No results found for: VD25OH, VD125OH2TOT, ZO1096EA5, WU9811BJ4, 25OHVITD1, 25OHVITD2, 25OHVITD3, TESTOFREE, TESTOSTERONE   Inflammation (CRP: Acute Phase) (ESR: Chronic Phase) No results found for: CRP, ESRSEDRATE, LATICACIDVEN     Note: Above Lab results reviewed.   Assessment  The primary encounter diagnosis was  Lumbar spondylosis. Diagnoses of Lumbar facet arthropathy, Spinal stenosis, lumbar region, with neurogenic claudication, Failed back surgical syndrome, DDD (degenerative disc disease), lumbosacral, and Chronic pain syndrome were also pertinent to this visit.  Plan of Care  Mr. TALHA ISER has a current medication list which includes the following long-term medication(s): albuterol, atorvastatin, cetirizine, gabapentin, lisinopril, and lisinopril.   Orders:  Orders Placed This Encounter  Procedures  . LUMBAR FACET(MEDIAL BRANCH NERVE BLOCK) MBNB    Standing Status:   Future    Standing Expiration Date:   02/28/2020    Scheduling Instructions:     Procedure: Lumbar facet block (AKA.: Lumbosacral medial branch nerve block)     Side: Bilateral     Level: L3-4, L4-5,  Facets (3, L4, L5, Medial Branch Nerves)     Sedation: Patient's choice.     Timeframe: ASAA    Order Specific Question:   Where will this procedure be performed?    Answer:   [facility]   Follow-up plan:   Return in about 5 weeks (around 03/03/2020) for B/L L3,4,5 Fcts with sedation.     Status post bilateral L3, L4, L5, S1 lumbar facet medial branch nerve blocks on 03/20/2018 helped 85% pain relief for approximately 1 year. Status post left L3, L4, L5 RFA on 07/11/2019, Right L3, L4, L5 RFA 07/25/19. Not as helpful as lumbar facet MBBs- repeat PRN, consider SPRINT PNS         Recent Visits Date Type Provider Dept  12/03/19 Procedure visit Ryan Santa, MD [facility]  10/30/19 Office Visit Ryan Santa, MD [facility]  Showing recent visits within past 90 days and meeting all other requirements Today's Visits Date Type Provider Dept  01/28/20 Telemedicine Ryan Santa, MD [facility]  Showing today's visits and meeting all other requirements Future Appointments No visits were found meeting these conditions. Showing future appointments within next 90 days and meeting all other  requirements  I discussed the assessment and treatment plan with the patient. The patient was provided an opportunity to ask questions and all were answered. The patient agreed with the plan and demonstrated an understanding of the instructions.  Patient advised to call back or seek an in-person evaluation if the symptoms or condition worsens.  Duration of encounter: 56mnutes.  Note by: BGillis Santa MD Date: 01/28/2020; Time: 1:06 PM

## 2020-02-12 DIAGNOSIS — H906 Mixed conductive and sensorineural hearing loss, bilateral: Secondary | ICD-10-CM | POA: Diagnosis not present

## 2020-02-13 ENCOUNTER — Encounter: Payer: Self-pay | Admitting: Student in an Organized Health Care Education/Training Program

## 2020-02-13 ENCOUNTER — Other Ambulatory Visit: Payer: Self-pay

## 2020-02-13 ENCOUNTER — Ambulatory Visit
Admission: RE | Admit: 2020-02-13 | Discharge: 2020-02-13 | Disposition: A | Discharge status: Home / Self Care | Payer: Medicaid Other | Source: Ambulatory Visit | Attending: Student in an Organized Health Care Education/Training Program | Admitting: Student in an Organized Health Care Education/Training Program

## 2020-02-13 ENCOUNTER — Ambulatory Visit (HOSPITAL_BASED_OUTPATIENT_CLINIC_OR_DEPARTMENT_OTHER): Payer: Medicaid Other | Admitting: Student in an Organized Health Care Education/Training Program

## 2020-02-13 DIAGNOSIS — M48062 Spinal stenosis, lumbar region with neurogenic claudication: Secondary | ICD-10-CM | POA: Diagnosis present

## 2020-02-13 DIAGNOSIS — M5137 Other intervertebral disc degeneration, lumbosacral region: Secondary | ICD-10-CM | POA: Insufficient documentation

## 2020-02-13 DIAGNOSIS — G894 Chronic pain syndrome: Secondary | ICD-10-CM | POA: Diagnosis not present

## 2020-02-13 DIAGNOSIS — M47816 Spondylosis without myelopathy or radiculopathy, lumbar region: Secondary | ICD-10-CM | POA: Insufficient documentation

## 2020-02-13 DIAGNOSIS — M51379 Other intervertebral disc degeneration, lumbosacral region without mention of lumbar back pain or lower extremity pain: Secondary | ICD-10-CM

## 2020-02-13 MED ORDER — LIDOCAINE HCL (PF) 2 % IJ SOLN
INTRAMUSCULAR | Status: AC
  Filled 2020-02-13: qty 10

## 2020-02-13 MED ORDER — ROPIVACAINE HCL 2 MG/ML IJ SOLN
9.0000 mL | Freq: Once | INTRAMUSCULAR | Status: AC
  Administered 2020-02-13: 9 mL via PERINEURAL

## 2020-02-13 MED ORDER — FENTANYL CITRATE (PF) 100 MCG/2ML IJ SOLN
25.0000 ug | INTRAMUSCULAR | Status: AC | PRN
  Administered 2020-02-13 (×2): 50 ug via INTRAVENOUS

## 2020-02-13 MED ORDER — DEXAMETHASONE SODIUM PHOSPHATE 10 MG/ML IJ SOLN
10.0000 mg | Freq: Once | INTRAMUSCULAR | Status: AC
  Administered 2020-02-13: 10 mg

## 2020-02-13 MED ORDER — LIDOCAINE HCL 2 % IJ SOLN
20.0000 mL | Freq: Once | INTRAMUSCULAR | Status: AC
  Administered 2020-02-13: 200 mg

## 2020-02-13 MED ORDER — FENTANYL CITRATE (PF) 100 MCG/2ML IJ SOLN
INTRAMUSCULAR | Status: AC
  Filled 2020-02-13: qty 2

## 2020-02-13 MED ORDER — ROPIVACAINE HCL 2 MG/ML IJ SOLN
INTRAMUSCULAR | Status: AC
  Filled 2020-02-13: qty 20

## 2020-02-13 MED ORDER — DEXAMETHASONE SODIUM PHOSPHATE 10 MG/ML IJ SOLN
INTRAMUSCULAR | Status: AC
  Filled 2020-02-13: qty 2

## 2020-02-13 NOTE — Patient Instructions (Signed)
____________________________________________________________________________________________  Post-Procedure Discharge Instructions  Instructions:  Apply ice:   Purpose: This will minimize any swelling and discomfort after procedure.   When: Day of procedure, as soon as you get home.  How: Fill a plastic sandwich bag with crushed ice. Cover it with a Arechiga towel and apply to injection site.  How long: (15 min on, 15 min off) Apply for 15 minutes then remove x 15 minutes.  Repeat sequence on day of procedure, until you go to bed.  Apply heat:   Purpose: To treat any soreness and discomfort from the procedure.  When: Starting the next day after the procedure.  How: Apply heat to procedure site starting the day following the procedure.  How long: May continue to repeat daily, until discomfort goes away.  Food intake: Start with clear liquids (like water) and advance to regular food, as tolerated.   Physical activities: Keep activities to a minimum for the first 8 hours after the procedure. After that, then as tolerated.  Driving: If you have received any sedation, be responsible and do not drive. You are not allowed to drive for 24 hours after having sedation.  Blood thinner: (Applies only to those taking blood thinners) You may restart your blood thinner 6 hours after your procedure.  Insulin: (Applies only to Diabetic patients taking insulin) As soon as you can eat, you may resume your normal dosing schedule.  Infection prevention: Keep procedure site clean and dry. Shower daily and clean area with soap and water.  Post-procedure Pain Diary: Extremely important that this be done correctly and accurately. Recorded information will be used to determine the next step in treatment. For the purpose of accuracy, follow these rules:  Evaluate only the area treated. Do not report or include pain from an untreated area. For the purpose of this evaluation, ignore all other areas of pain,  except for the treated area.  After your procedure, avoid taking a long nap and attempting to complete the pain diary after you wake up. Instead, set your alarm clock to go off every hour, on the hour, for the initial 8 hours after the procedure. Document the duration of the numbing medicine, and the relief you are getting from it.  Do not go to sleep and attempt to complete it later. It will not be accurate. If you received sedation, it is likely that you were given a medication that may cause amnesia. Because of this, completing the diary at a later time may cause the information to be inaccurate. This information is needed to plan your care.  Follow-up appointment: Keep your post-procedure follow-up evaluation appointment after the procedure (usually 2 weeks for most procedures, 6 weeks for radiofrequencies). DO NOT FORGET to bring you pain diary with you.   Expect: (What should I expect to see with my procedure?)  From numbing medicine (AKA: Local Anesthetics): Numbness or decrease in pain. You may also experience some weakness, which if present, could last for the duration of the local anesthetic.  Onset: Full effect within 15 minutes of injected.  Duration: It will depend on the type of local anesthetic used. On the average, 1 to 8 hours.   From steroids (Applies only if steroids were used): Decrease in swelling or inflammation. Once inflammation is improved, relief of the pain will follow.  Onset of benefits: Depends on the amount of swelling present. The more swelling, the longer it will take for the benefits to be seen. In some cases, up to 10 days.    Duration: Steroids will stay in the system x 2 weeks. Duration of benefits will depend on multiple posibilities including persistent irritating factors.  Side-effects: If present, they may typically last 2 weeks (the duration of the steroids).  Frequent: Cramps (if they occur, drink Gatorade and take over-the-counter Magnesium 450-500 mg  once to twice a day); water retention with temporary weight gain; increases in blood sugar; decreased immune system response; increased appetite.  Occasional: Facial flushing (red, warm cheeks); mood swings; menstrual changes.  Uncommon: Long-term decrease or suppression of natural hormones; bone thinning. (These are more common with higher doses or more frequent use. This is why we prefer that our patients avoid having any injection therapies in other practices.)   Very Rare: Severe mood changes; psychosis; aseptic necrosis.  From procedure: Some discomfort is to be expected once the numbing medicine wears off. This should be minimal if ice and heat are applied as instructed.  Call if: (When should I call?)  You experience numbness and weakness that gets worse with time, as opposed to wearing off.  New onset bowel or bladder incontinence. (Applies only to procedures done in the spine)  Emergency Numbers:  Durning business hours (Monday - Thursday, 8:00 AM - 4:00 PM) (Friday, 9:00 AM - 12:00 Noon): [phone]  After hours: [phone]  NOTE: If you are having a problem and are unable connect with, or to talk to a provider, then go to your nearest urgent care or emergency department. If the problem is serious and urgent, please call 911. ____________________________________________________________________________________________    

## 2020-02-13 NOTE — Progress Notes (Signed)
Safety precautions to be maintained throughout the outpatient stay will include: orient to surroundings, keep bed in low position, maintain call bell within reach at all times, provide assistance with transfer out of bed and ambulation.  

## 2020-02-13 NOTE — Progress Notes (Signed)
Patient's Name: Ryan [name]  MRN: [id number]  Referring Provider: Edward Jolly, MD  DOB: [birth year]  PCP: Smitty Cords, DO  DOS: 02/13/2020  Note by: Edward Jolly, MD  Service setting: Ambulatory outpatient  Specialty: Interventional Pain Management  Patient type: Established  Location: [facility] (AMB) Pain Management Facility  Visit type: Interventional Procedure   Primary Reason for Visit: Interventional Pain Management Treatment. CC: Back Pain  Procedure:          Anesthesia, Analgesia, Anxiolysis:  Type: Lumbar Facet, Medial Branch Block(s) #1 2022 Primary Purpose: Therapeutic Region: Posterolateral Lumbosacral Spine Level: L3, L4, L5,  Medial Branch Level(s). Injecting these levels blocks the L3-4, L4-5 lumbar facet joints. Laterality: Bilateral  Type: Moderate (Conscious) Sedation combined with Local Anesthesia Indication(s): Analgesia and Anxiety Route: Intravenous (IV) IV Access: Secured Sedation: Meaningful verbal contact was maintained at all times during the procedure  Local Anesthetic: Lidocaine 1-2%  Position: Prone   Indications: 1. Lumbar spondylosis   2. Lumbar facet arthropathy   3. Spinal stenosis, lumbar region, with neurogenic claudication   4. DDD (degenerative disc disease), lumbosacral   5. Chronic pain syndrome    Pain Score: Pre-procedure: 6 /10 Post-procedure: 2  (standing)/10  Pre-op Assessment:  Ryan [name] is a 65 y.o. (year old), male patient, seen today for interventional treatment. He  has a past surgical history that includes Eye surgery; Hernia repair; Transforaminal lumbar interbody fusion (tlif) with pedicle screw fixation 1 level (Right, 06/18/2014); Myringotomy with tube placement (Bilateral, 03/28/2015); Back surgery; Esophagogastroduodenoscopy (egd) with propofol (N/A, 10/24/2015); Cataract extraction w/PHACO (Left, 01/06/2016); Tonsillectomy; Colonoscopy with propofol (N/A, 04/19/2016); and Sinusotomy (02/28/2019). Ryan [name] has a current  medication list which includes the following prescription(s): acetaminophen, albuterol, alprazolam, aspirin, atorvastatin, cetirizine, gabapentin, lisinopril, lisinopril, nicotine, sildenafil, and tamsulosin. His primarily concern today is the Back Pain  Initial Vital Signs:  Pulse/HCG Rate: 95ECG Heart Rate: 94 Temp: 97.8 F (36.6 C) Resp: 16 BP: 129/88 SpO2: 99 %  BMI: Estimated body mass index is 26.94 kg/m as calculated from the following:   Height as of this encounter: 5\' 7"  (1.702 m).   Weight as of this encounter: 172 lb (78 kg).  Risk Assessment: Allergies: Reviewed. He has No Known Allergies.  Allergy Precautions: None required Coagulopathies: Reviewed. None identified.  Blood-thinner therapy: None at this time Active Infection(s): Reviewed. None identified. Ryan [name] is afebrile  Site Confirmation: Ryan [name] was asked to confirm the procedure and laterality before marking the site Procedure checklist: Completed Consent: Before the procedure and under the influence of no sedative(s), amnesic(s), or anxiolytics, the patient was informed of the treatment options, risks and possible complications. To fulfill our ethical and legal obligations, as recommended by the American Medical Association's Code of Ethics, I have informed the patient of my clinical impression; the nature and purpose of the treatment or procedure; the risks, benefits, and possible complications of the intervention; the alternatives, including doing nothing; the risk(s) and benefit(s) of the alternative treatment(s) or procedure(s); and the risk(s) and benefit(s) of doing nothing. The patient was provided information about the general risks and possible complications associated with the procedure. These may include, but are not limited to: failure to achieve desired goals, infection, bleeding, organ or nerve damage, allergic reactions, paralysis, and death. In addition, the patient was informed of those risks and  complications associated to Spine-related procedures, such as failure to decrease pain; infection (i.e.: Meningitis, epidural or intraspinal abscess); bleeding (i.e.: epidural hematoma, subarachnoid hemorrhage, or any other  type of intraspinal or peri-dural bleeding); organ or nerve damage (i.e.: Any type of peripheral nerve, nerve root, or spinal cord injury) with subsequent damage to sensory, motor, and/or autonomic systems, resulting in permanent pain, numbness, and/or weakness of one or several areas of the body; allergic reactions; (i.e.: anaphylactic reaction); and/or death. Furthermore, the patient was informed of those risks and complications associated with the medications. These include, but are not limited to: allergic reactions (i.e.: anaphylactic or anaphylactoid reaction(s)); adrenal axis suppression; blood sugar elevation that in diabetics may result in ketoacidosis or comma; water retention that in patients with history of congestive heart failure may result in shortness of breath, pulmonary edema, and decompensation with resultant heart failure; weight gain; swelling or edema; medication-induced neural toxicity; particulate matter embolism and blood vessel occlusion with resultant organ, and/or nervous system infarction; and/or aseptic necrosis of one or more joints. Finally, the patient was informed that Medicine is not an exact science; therefore, there is also the possibility of unforeseen or unpredictable risks and/or possible complications that may result in a catastrophic outcome. The patient indicated having understood very clearly. We have given the patient no guarantees and we have made no promises. Enough time was given to the patient to ask questions, all of which were answered to the patient's satisfaction. Ryan [name] has indicated that he wanted to continue with the procedure. Attestation: I, the ordering provider, attest that I have discussed with the patient the benefits, risks,  side-effects, alternatives, likelihood of achieving goals, and potential problems during recovery for the procedure that I have provided informed consent. Date  Time: 02/13/2020 10:15 AM  Pre-Procedure Preparation:  Monitoring: As per clinic protocol. Respiration, ETCO2, SpO2, BP, heart rate and rhythm monitor placed and checked for adequate function Safety Precautions: Patient was assessed for positional comfort and pressure points before starting the procedure. Time-out: I initiated and conducted the "Time-out" before starting the procedure, as per protocol. The patient was asked to participate by confirming the accuracy of the "Time Out" information. Verification of the correct person, site, and procedure were performed and confirmed by me, the nursing staff, and the patient. "Time-out" conducted as per Joint Commission's Universal Protocol (UP.01.01.01). Time: 1057  Description of Procedure:          Laterality: Bilateral. The procedure was performed in identical fashion on both sides. Levels:  L3, L4, L5,  Medial Branch Level(s) Area Prepped: Posterior Lumbosacral Region Prepping solution: ChloraPrep (2% chlorhexidine gluconate and 70% isopropyl alcohol) Safety Precautions: Aspiration looking for blood return was conducted prior to all injections. At no point did we inject any substances, as a needle was being advanced. Before injecting, the patient was told to immediately notify me if he was experiencing any new onset of "ringing in the ears, or metallic taste in the mouth". No attempts were made at seeking any paresthesias. Safe injection practices and needle disposal techniques used. Medications properly checked for expiration dates. SDV (single dose vial) medications used. After the completion of the procedure, all disposable equipment used was discarded in the proper designated medical waste containers. Local Anesthesia: Protocol guidelines were followed. The patient was positioned over the  fluoroscopy table. The area was prepped in the usual manner. The time-out was completed. The target area was identified using fluoroscopy. A 12-in long, straight, sterile hemostat was used with fluoroscopic guidance to locate the targets for each level blocked. Once located, the skin was marked with an approved surgical skin marker. Once all sites were marked, the skin (  epidermis, dermis, and hypodermis), as well as deeper tissues (fat, connective tissue and muscle) were infiltrated with a Montanari amount of a short-acting local anesthetic, loaded on a 10cc syringe with a 25G, 1.5-in  Needle. An appropriate amount of time was allowed for local anesthetics to take effect before proceeding to the next step. Local Anesthetic: Lidocaine 2.0% The unused portion of the local anesthetic was discarded in the proper designated containers. Technical explanation of process:   L3 Medial Branch Nerve Block (MBB): The target area for the L3 medial branch is at the junction of the postero-lateral aspect of the superior articular process and the superior, posterior, and medial edge of the transverse process of L4. Under fluoroscopic guidance, a Quincke needle was inserted until contact was made with os over the superior postero-lateral aspect of the pedicular shadow (target area). After negative aspiration for blood,1 cc  mL of the nerve block solution was injected without difficulty or complication. The needle was removed intact. L4 Medial Branch Nerve Block (MBB): The target area for the L4 medial branch is at the junction of the postero-lateral aspect of the superior articular process and the superior, posterior, and medial edge of the transverse process of L5. Under fluoroscopic guidance, a Quincke needle was inserted until contact was made with os over the superior postero-lateral aspect of the pedicular shadow (target area). After negative aspiration for blood,1cc mL of the nerve block solution was injected without  difficulty or complication. The needle was removed intact. L5 Medial Branch Nerve Block (MBB): The target area for the L5 medial branch is at the junction of the postero-lateral aspect of the superior articular process and the superior, posterior, and medial edge of the sacral ala. Under fluoroscopic guidance, a Quincke needle was inserted until contact was made with os over the superior postero-lateral aspect of the pedicular shadow (target area). After negative aspiration for blood,1cc  mL of the nerve block solution was injected without difficulty or complication. The needle was removed intact.   Nerve block solution: 10 cc solution made of 8 cc of 0.2% ropivacaine, 2 cc of Decadron 10 mg/cc.  1-1.5 cc injected at each level above bilaterally  Procedural Needles: 22-gauge, 3.5-inch, Quincke needles used for all levels.  Once the entire procedure was completed, the treated area was cleaned, making sure to leave some of the prepping solution back to take advantage of its long term bactericidal properties.   Illustration of the posterior view of the lumbar spine and the posterior neural structures. Laminae of L2 through S1 are labeled. DPRL5, dorsal primary ramus of L5; DPRS1, dorsal primary ramus of S1; DPR3, dorsal primary ramus of L3; FJ, facet (zygapophyseal) joint L3-L4; I, inferior articular process of L4; LB1, lateral branch of dorsal primary ramus of L1; IAB, inferior articular branches from L3 medial branch (supplies L4-L5 facet joint); IBP, intermediate branch plexus; MB3, medial branch of dorsal primary ramus of L3; NR3, third lumbar nerve root; S, superior articular process of L5; SAB, superior articular branches from L4 (supplies L4-5 facet joint also); TP3, transverse process of L3.  Vitals:   02/13/20 1110 02/13/20 1120 02/13/20 1130 02/13/20 1140  BP: (!) 141/92 131/88 (!) 127/96 132/85  Pulse:  95 97 98  Resp: 12 16 17 16   Temp:   (!) 97.4 F (36.3 C)   SpO2: 97% 99% 98% 98%   Weight:      Height:         Start Time: 1057 hrs. End Time: 1110 hrs.  Imaging Guidance (Spinal):          Type of Imaging Technique: Fluoroscopy Guidance (Spinal) Indication(s): Assistance in needle guidance and placement for procedures requiring needle placement in or near specific anatomical locations not easily accessible without such assistance. Exposure Time: Please see nurses notes. Contrast: None used. Fluoroscopic Guidance: I was personally present during the use of fluoroscopy. "Tunnel Vision Technique" used to obtain the best possible view of the target area. Parallax error corrected before commencing the procedure. "Direction-depth-direction" technique used to introduce the needle under continuous pulsed fluoroscopy. Once target was reached, antero-posterior, oblique, and lateral fluoroscopic projection used confirm needle placement in all planes. Images permanently stored in EMR. Interpretation: No contrast injected. I personally interpreted the imaging intraoperatively. Adequate needle placement confirmed in multiple planes. Permanent images saved into the patient's record.   Post-operative Assessment:  Post-procedure Vital Signs:  Pulse/HCG Rate: 9892 Temp: (!) 97.4 F (36.3 C) Resp: 16 BP: 132/85 SpO2: 98 %  EBL: None  Complications: No immediate post-treatment complications observed by team, or reported by patient.  Note: The patient tolerated the entire procedure well. A repeat set of vitals were taken after the procedure and the patient was kept under observation following institutional policy, for this type of procedure. Post-procedural neurological assessment was performed, showing return to baseline, prior to discharge. The patient was provided with post-procedure discharge instructions, including a section on how to identify potential problems. Should any problems arise concerning this procedure, the patient was given instructions to immediately contact us, at  any time, without hesitation. In any case, we plan to contact the patient by telephone for a follow-up status report regarding this interventional procedure.  Comments:  No additional relevant information.  Plan of Care    Imaging Orders     DG PAIN CLINIC C-ARM 1-60 MIN NO REPORT  Medications ordered for procedure: Meds ordered this encounter  Medications  . lidocaine (XYLOCAINE) 2 % (with pres) injection 400 mg  . fentaNYL (SUBLIMAZE) injection 25-50 mcg    Make sure Narcan is available in the pyxis when using this medication. In the event of respiratory depression (RR< 8/min): Titrate NARCAN (naloxone) in increments of 0.1 to 0.2 mg IV at 2-3 minute intervals, until desired degree of reversal.  . ropivacaine (PF) 2 mg/mL (0.2%) (NAROPIN) injection 9 mL  . ropivacaine (PF) 2 mg/mL (0.2%) (NAROPIN) injection 9 mL  . dexamethasone (DECADRON) injection 10 mg  . dexamethasone (DECADRON) injection 10 mg   Medications administered: We administered lidocaine, fentaNYL, ropivacaine (PF) 2 mg/mL (0.2%), ropivacaine (PF) 2 mg/mL (0.2%), dexamethasone, and dexamethasone.  See the medical record for exact dosing, route, and time of administration.  Disposition: Discharge home  Discharge Date & Time: 02/13/2020;   hrs.   Physician-requested Follow-up: Return in about 8 weeks (around 04/09/2020) for Post Procedure Evaluation, virtual.  Future Appointments  Date Time Provider Department Center  03/11/2020  3:40 PM Edward JollyLateef, Taliah Porche, MD [facility]   Primary Care Physician: Smitty CordsKaramalegos, Alexander J, DO [facility] Note by: Edward JollyBilal Kayleana Waites, MD Date: 02/13/2020; Time: 12:22 PM  Disclaimer:  Medicine is not an exact science. The only guarantee in medicine is that nothing is guaranteed. It is important to note that the decision to proceed with this intervention was based on the information collected from the patient. The Data and conclusions were drawn from the  patient's questionnaire, the interview, and the physical examination. Because the information was provided in large part by the patient, it  cannot be guaranteed that it has not been purposely or unconsciously manipulated. [name] effort has been made to obtain as much relevant data as possible for this evaluation. It is important to note that the conclusions that lead to this procedure are derived in large part from the available data. Always take into account that the treatment will also be dependent on availability of resources and existing treatment guidelines, considered by other Pain Management Practitioners as being common knowledge and practice, at the time of the intervention. For Medico-Legal purposes, it is also important to point out that variation in procedural techniques and pharmacological choices are the acceptable norm. The indications, contraindications, technique, and results of the above procedure should only be interpreted and judged by a Board-Certified Interventional Pain Specialist with extensive familiarity and expertise in the same exact procedure and technique.

## 2020-02-14 ENCOUNTER — Telehealth: Payer: Self-pay | Admitting: *Deleted

## 2020-02-14 NOTE — Telephone Encounter (Signed)
Called for post procedure check. Denies any issues. 

## 2020-02-19 ENCOUNTER — Telehealth: Payer: Self-pay

## 2020-02-19 DIAGNOSIS — J0131 Acute recurrent sphenoidal sinusitis: Secondary | ICD-10-CM | POA: Diagnosis not present

## 2020-02-19 DIAGNOSIS — H903 Sensorineural hearing loss, bilateral: Secondary | ICD-10-CM | POA: Diagnosis not present

## 2020-02-19 NOTE — Telephone Encounter (Signed)
Copied from CRM [id number]. Topic: General - Other >> Feb 19, 2020 10:56 AM Ryan [name] E wrote: Reason for CRM: Pt turned in his paperwork to the [facility] that was completed by his PCP but they returned it and stated it requires emidiate attention/ Pt stated something was not completed correctly/ please advise asap/ it needs faxed back to [facility] after correcting asap    I just tried calling the patient and he did not answer and the voicemail box is not accepting messages right now. It appears that the only information about DMV paperwork was completed by Dr Kirtland Bouchard. back in Sept of 2021.

## 2020-02-20 DIAGNOSIS — F341 Dysthymic disorder: Secondary | ICD-10-CM | POA: Diagnosis not present

## 2020-02-20 DIAGNOSIS — F411 Generalized anxiety disorder: Secondary | ICD-10-CM | POA: Diagnosis not present

## 2020-03-03 ENCOUNTER — Ambulatory Visit: Payer: Self-pay | Admitting: *Deleted

## 2020-03-03 NOTE — Telephone Encounter (Signed)
Pt scheduled for 03/04/2020 with Dr.Rumball

## 2020-03-03 NOTE — Telephone Encounter (Signed)
Wife Angelique Blonder called in for husband.   He has been having watery diarrhea for 3 days now.  He is eating and drinking but everything is going straight through him.  He has been on an antibiotic from the ENT doctor for an infection along with prednisone.  As soon as he finished the antibiotic this diarrhea started.  I let her know thee was not a provider in the office today so I was going to send a message high hpriority because the staff is in the office.   She was agreeable to this.  She can be reached at [phone], her cell.  I sent these notes to [facility] high priority.    Reason for Disposition . [1] SEVERE diarrhea (e.g., 7 or more times / day more than normal) AND [2] age > 60 years  Answer Assessment - Initial Assessment Questions 1. DIARRHEA SEVERITY: "How bad is the diarrhea?" "How many extra stools have you had in the past 24 hours than normal?"    - NO DIARRHEA (SCALE 0)   - MILD (SCALE 1-3): Few loose or mushy BMs; increase of 1-3 stools over normal daily number of stools; mild increase in ostomy output.   -  MODERATE (SCALE 4-7): Increase of 4-6 stools daily over normal; moderate increase in ostomy output. * SEVERE (SCALE 8-10; OR 'WORST POSSIBLE'): Increase of 7 or more stools daily over normal; moderate increase in ostomy output; incontinence.     Wife calling in for husband.   3 days of diarrhea going straight through him.   Taking Pepto Bismol but not helping.   He gets real hot when he goes to the bathroom.   2. ONSET: "When did the diarrhea begin?"      3 days ago 3. BM CONSISTENCY: "How loose or watery is the diarrhea?"      Very watery He has been on antibiotic and prednisone.   He had an infection in his head and was put on antibiotic.    He is eating  But it is going through him. 4. VOMITING: "Are you also vomiting?" If Yes, ask: "How many times in the past 24 hours?"      No vomiting 5. ABDOMINAL PAIN: "Are you having any abdominal pain?" If Yes,  ask: "What does it feel like?" (e.g., crampy, dull, intermittent, constant)      No 6. ABDOMINAL PAIN SEVERITY: If present, ask: "How bad is the pain?"  (e.g., Scale 1-10; mild, moderate, or severe)   - MILD (1-3): doesn't interfere with normal activities, abdomen soft and not tender to touch    - MODERATE (4-7): interferes with normal activities or awakens from sleep, tender to touch    - SEVERE (8-10): excruciating pain, doubled over, unable to do any normal activities       Severe 7. ORAL INTAKE: If vomiting, "Have you been able to drink liquids?" "How much fluids have you had in the past 24 hours?"     He is drinking water and eating but it's going straight through. 8. HYDRATION: "Any signs of dehydration?" (e.g., dry mouth [not just dry lips], too weak to stand, dizziness, new weight loss) "When did you last urinate?"     No    9. EXPOSURE: "Have you traveled to a foreign country recently?" "Have you been exposed to anyone with diarrhea?" "Could you have eaten any food that was spoiled?"     No 10. ANTIBIOTIC USE: "Are you taking antibiotics now or have you  taken antibiotics in the past 2 months?"       Yes  Antibiotic for ENT infection 11. OTHER SYMPTOMS: "Do you have any other symptoms?" (e.g., fever, blood in stool)       No fever 12. PREGNANCY: "Is there any chance you are pregnant?" "When was your last menstrual period?"       N/A  Protocols used: DIARRHEA-A-AH

## 2020-03-03 NOTE — Telephone Encounter (Signed)
If he can't keep anything down, he will likely need to go to ER for fluids, otherwise can we please see if someone has any availability today or tomorrow to be seen in person.

## 2020-03-04 ENCOUNTER — Other Ambulatory Visit: Payer: Self-pay

## 2020-03-04 ENCOUNTER — Ambulatory Visit (INDEPENDENT_AMBULATORY_CARE_PROVIDER_SITE_OTHER): Payer: Medicaid Other | Admitting: Family Medicine

## 2020-03-04 ENCOUNTER — Encounter: Payer: Self-pay | Admitting: Family Medicine

## 2020-03-04 DIAGNOSIS — R195 Other fecal abnormalities: Secondary | ICD-10-CM | POA: Insufficient documentation

## 2020-03-04 NOTE — Assessment & Plan Note (Signed)
Likely 2/2 antibiotics from recent sinus infection, self-improving.  No red flags on history or exam to concern for obstruction, infection, ischemia, hernia. Recommend probiotics to build back gut flora and plenty of soluble fiber. F/u if no better.

## 2020-03-04 NOTE — Progress Notes (Signed)
   SUBJECTIVE:   CHIEF COMPLAINT / HPI:   Patient Active Problem List   Diagnosis Date Noted  . Loose stools 03/04/2020  . Overweight (BMI 25.0-29.9) 10/23/2019  . History of CVA (cerebrovascular accident) without residual deficits 10/23/2019  . Pre-diabetes 10/23/2019  . Mixed hyperlipidemia 10/23/2019  . Chronic pain syndrome 08/15/2019  . Lumbar facet arthropathy 03/07/2018  . 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 History of lumbar fusion 03/07/2018  . DDD (degenerative disc disease), lumbosacral 06/02/2014  . Spinal stenosis, lumbar region, with neurogenic claudication 06/02/2014  . Lumbar radiculopathy 06/02/2014  . Centrilobular emphysema (HCC) 04/19/2014  . Chronic LBP 04/19/2014  . Essential hypertension 04/19/2014  . Compulsive tobacco user syndrome 05/22/2013  . 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   ABDOMINAL ISSUES - noted constipation after starting antibiotic and prednisone for sinus infection a week ago. Took laxative but then noted diarrhea 4 days after discontinuing antibiotic for about 5 days. - improving with pepto bismol - 2 BMs today, Type 1 and 5 on stool chart.  Duration: days Nature: ill-defined Location: diffuse  Radiation: no Frequency: occasional Alleviating factors: pepto bismol Aggravating factors: chili last night Treatments attempted: pepto bismol Constipation: no Diarrhea: yes, though improved Mucous in the stool: no Heartburn: no Bloating:no Nausea: no Vomiting: no Melena or hematochezia: no Rash: no Jaundice: no Fever: no Weight loss: no   OBJECTIVE:   BP 111/72   Pulse 75   Temp 98.1 F (36.7 C)   Ht 5\' 8"  (1.727 m)   Wt 171 lb (77.6 kg)   SpO2 97%   BMI 26.00 kg/m   Gen: well appearing, in NAD Card: RRR Lungs: CTAB Abd: soft, NTND, +BS. No organomegaly. Negative murphy sign. No tenderness over McBurney's point,  ASSESSMENT/PLAN:   Loose stools Likely 2/2 antibiotics from recent sinus  infection, self-improving.  No red flags on history or exam to concern for obstruction, infection, ischemia, hernia. Recommend probiotics to build back gut flora and plenty of soluble fiber. F/u if no better.    , DO

## 2020-03-04 NOTE — Patient Instructions (Signed)
It was great to see you!  Our plans for today:  - try yogurt or probiotics for your bowel movements. - Eating plenty of fiber (whole wheat, fruits, vegetables, avocado, oats, peas, beans) can help keep you regular. - If you are still having issues in the next week or two after trying this, come back to see [place].   Take care and seek immediate care sooner if you develop any concerns.   Dr. Linwood Dibbles

## 2020-03-05 DIAGNOSIS — F411 Generalized anxiety disorder: Secondary | ICD-10-CM | POA: Diagnosis not present

## 2020-03-05 DIAGNOSIS — F331 Major depressive disorder, recurrent, moderate: Secondary | ICD-10-CM | POA: Diagnosis not present

## 2020-03-07 ENCOUNTER — Encounter: Payer: Self-pay | Admitting: Student in an Organized Health Care Education/Training Program

## 2020-03-11 ENCOUNTER — Other Ambulatory Visit: Payer: Self-pay

## 2020-03-11 ENCOUNTER — Encounter: Payer: Self-pay | Admitting: Student in an Organized Health Care Education/Training Program

## 2020-03-11 ENCOUNTER — Ambulatory Visit
Payer: Medicaid Other | Attending: Student in an Organized Health Care Education/Training Program | Admitting: Student in an Organized Health Care Education/Training Program

## 2020-03-11 DIAGNOSIS — M47816 Spondylosis without myelopathy or radiculopathy, lumbar region: Secondary | ICD-10-CM

## 2020-03-11 DIAGNOSIS — G894 Chronic pain syndrome: Secondary | ICD-10-CM | POA: Diagnosis not present

## 2020-03-11 DIAGNOSIS — M48062 Spinal stenosis, lumbar region with neurogenic claudication: Secondary | ICD-10-CM

## 2020-03-11 NOTE — Progress Notes (Signed)
Patient: Ryan [name]  Service Category: E/M  Provider: [name], MD  DOB: 02-May-1955  DOS: 03/11/2020  Location: Office  MRN: [id number]  Setting: Ambulatory outpatient  Referring Provider: Nobie [name] *  Type: Established Patient  Specialty: Interventional Pain Management  PCP: Ryan Hauser, DO  Location: Home  Delivery: TeleHealth     Virtual Encounter - Pain Management PROVIDER NOTE: Information contained herein reflects review and annotations entered in association with encounter. Interpretation of such information and data should be left to medically-trained personnel. Information provided to patient can be located elsewhere in the medical record under "Patient Instructions". Document created using STT-dictation technology, any transcriptional errors that may result from process are unintentional.    Contact & Pharmacy Preferred: [phone] Home: [phone] (home) Mobile: There is no such number on file (mobile). E-mail: [facility] [address] ([address]), [place] [address] [place]) [place] [address] Phone: [phone] Fax: [phone]   Pre-screening  Ryan [name] offered "in-person" vs "virtual" encounter. He indicated preferring virtual for this encounter.   Reason COVID-19*  Social distancing based on CDC and AMA recommendations.   I contacted Ryan [name] on 03/11/2020 via video conference.      I clearly identified myself as [name], MD. I verified that I was speaking with the correct person using two identifiers (Name: Ryan [name], and date of birth: 29-Aug-1955).  Consent I sought verbal advanced consent from Ryan [name] for virtual visit interactions. I informed Ryan [name] of possible security and privacy concerns, risks, and limitations associated with providing "not-in-person" medical evaluation and management services. I also informed Ryan [name] of the availability of  "in-person" appointments. Finally, I informed him that there would be a charge for the virtual visit and that he could be  personally, fully or partially, financially responsible for it. Ryan [name] expressed understanding and agreed to proceed.   Historic Elements   Ryan [name] is a 65 y.o. year old, male patient evaluated today after our last contact on 02/13/2020. Ryan [name]  has a past medical history of Anxiety, Arthritis, Asthma, Chronic kidney disease, COPD (chronic obstructive pulmonary disease) ([place]), Dyspnea, GERD (gastroesophageal reflux disease), Hyperlipidemia, Hypertension, Lower urinary tract symptoms (LUTS), Prostate disease, Stroke [facility]), Thyroid disease, Wears dentures, and Wears hearing aid. He also  has a past surgical history that includes Eye surgery; Hernia repair; Transforaminal lumbar interbody fusion (tlif) with pedicle screw fixation 1 level (Right, 06/18/2014); Myringotomy with tube placement (Bilateral, 03/28/2015); Back surgery; Esophagogastroduodenoscopy (egd) with propofol (N/A, 10/24/2015); Cataract extraction w/PHACO (Left, 01/06/2016); Tonsillectomy; Colonoscopy with propofol (N/A, 04/19/2016); and Sinusotomy (02/28/2019). Ryan [name] has a current medication list which includes the following prescription(s): acetaminophen, albuterol, alprazolam, aspirin, atorvastatin, cetirizine, gabapentin, lisinopril, lisinopril, nicotine, sildenafil, and tamsulosin. He  reports that he has been smoking cigarettes. He has a 60.00 pack-year smoking history. He uses smokeless tobacco. He reports current alcohol use. He reports that he does not use drugs. Ryan [name] has No Known Allergies.   HPI  Today, he is being contacted for a post-procedure assessment.   Post-Procedure Evaluation  Procedure (02/13/2020):   Type: Lumbar Facet, Medial Branch Block(s) #1 2022 Primary Purpose: Therapeutic Region: Posterolateral Lumbosacral Spine Level: L3, L4, L5,  Medial Branch Level(s). Injecting these  levels blocks the L3-4, L4-5 lumbar facet joints. Laterality: Bilateral  Sedation: Please see nurses note.  Effectiveness during initial hour after procedure(Ultra-Short Term Relief): 100 %   Local anesthetic  used: Long-acting (4-6 hours) Effectiveness: Defined as any analgesic benefit obtained secondary to the administration of local anesthetics. This carries significant diagnostic value as to the etiological location, or anatomical origin, of the pain. Duration of benefit is expected to coincide with the duration of the local anesthetic used.  Effectiveness during initial 4-6 hours after procedure(Short-Term Relief): 100 % .  Long-term benefit: Defined as any relief past the pharmacologic duration of the local anesthetics.  Effectiveness past the initial 6 hours after procedure(Long-Term Relief): 60 % for 3 weeks  Current benefits: Defined as benefit that persist at this time.   Analgesia:  <50% better Function: Ryan [name] reports improvement in function ROM: Ryan [name] reports improvement in ROM   Laboratory Chemistry Profile   Renal Lab Results  Component Value Date   BUN 17 09/11/2019   CREATININE 1.26 (H) 09/11/2019   GFRAA >60 09/11/2019   GFRNONAA >60 09/11/2019     Hepatic Lab Results  Component Value Date   AST 19 09/10/2019   ALT 15 09/10/2019   ALBUMIN 5.0 09/10/2019   ALKPHOS 133 (H) 09/10/2019   LIPASE 77 (H) 10/19/2017     Electrolytes Lab Results  Component Value Date   NA 133 (L) 09/11/2019   K 3.5 09/11/2019   CL 99 09/11/2019   CALCIUM 8.6 (L) 09/11/2019     Bone No results found for: VD25OH, VD125OH2TOT, GQ6761PJ0, DT2671IW5, 25OHVITD1, 25OHVITD2, 25OHVITD3, TESTOFREE, TESTOSTERONE   Inflammation (CRP: Acute Phase) (ESR: Chronic Phase) No results found for: CRP, ESRSEDRATE, LATICACIDVEN     Note: Above Lab results reviewed.   Assessment  The primary encounter diagnosis was Lumbar spondylosis. Diagnoses of Lumbar facet arthropathy, Spinal  stenosis, lumbar region, with neurogenic claudication, and Chronic pain syndrome were also pertinent to this visit.  Plan of Care  Ryan [name] has a current medication list which includes the following long-term medication(s): albuterol, atorvastatin, cetirizine, gabapentin, lisinopril, and lisinopril. Repeat lumbar facet medial branch nerve block, therapeutic every 3 months.,  Plan for repeat after April 1.  Patient endorses that the facet medial branch nerve blocks provide better pain relief and improvement in functional status when compared with previous RFA.  Informed patient that we want to be mindful of annual neuraxial steroid exposure. Orders:  Orders Placed This Encounter  Procedures  . LUMBAR FACET(MEDIAL BRANCH NERVE BLOCK) MBNB    Standing Status:   Future    Standing Expiration Date:   04/08/2020    Scheduling Instructions:     Procedure: Lumbar facet block (AKA.: Lumbosacral medial branch nerve block)     Side: Bilateral     Level: L3-4, L4-5,  Facets ( L3, L4, L5,Medial Branch Nerves)     Sedation: with     Timeframe: After April 1    Order Specific Question:   Where will this procedure be performed?    Answer:   [facility]   Follow-up plan:   Return in about 7 weeks (around 04/29/2020).     Status post bilateral L3, L4, L5, S1 lumbar facet medial branch nerve blocks on 03/20/2018 helped 85% pain relief for approximately 1 year. Status post left L3, L4, L5 RFA on 07/11/2019, Right L3, L4, L5 RFA 07/25/19. Not as helpful as lumbar facet MBBs- repeat PRN, consider SPRINT PNS          Recent Visits Date Type Provider Dept  02/13/20 Procedure visit [name], MD [facility]  01/28/20 Telemedicine [name], MD [facility]  Showing recent visits within past 90 days and meeting all other requirements Today's Visits Date Type Provider Dept  03/11/20 Telemedicine [name], MD [facility]  Showing today's visits and meeting all  other requirements Future Appointments No visits were found meeting these conditions. Showing future appointments within next 90 days and meeting all other requirements  I discussed the assessment and treatment plan with the patient. The patient was provided an opportunity to ask questions and all were answered. The patient agreed with the plan and demonstrated an understanding of the instructions.  Patient advised to call back or seek an in-person evaluation if the symptoms or condition worsens.  Duration of encounter: 20mnutes.  Note by: BGillis Santa MD Date: 03/11/2020; Time: 3:17 PM

## 2020-03-18 ENCOUNTER — Other Ambulatory Visit: Payer: Self-pay | Admitting: Family Medicine

## 2020-03-18 DIAGNOSIS — J432 Centrilobular emphysema: Secondary | ICD-10-CM

## 2020-03-19 NOTE — Telephone Encounter (Signed)
Requested Prescriptions  Pending Prescriptions Disp Refills  . PROAIR HFA 108 (90 Base) MCG/ACT inhaler [Pharmacy Med Name: ProAir HFA 108 (90 Base) MCG/ACT Inhalation Aerosol Solution] 9 g 0    Sig: INHALE 2 PUFFS BY MOUTH EVERY 6 HOURS AS NEEDED FOR WHEEZING FOR SHORTNESS OF BREATH     Pulmonology:  Beta Agonists Failed - 03/18/2020  5:26 PM      Failed - One inhaler should last at least one month. If the patient is requesting refills earlier, contact the patient to check for uncontrolled symptoms.      Passed - Valid encounter within last 12 months    Recent Outpatient Visits          2 weeks ago Loose stools   [facility] Caro Laroche, DO   1 month ago Pain and swelling of right knee   [facility] [place], Netta Neat, DO   4 months ago Essential hypertension   [facility] [place], Netta Neat, [place]

## 2020-04-23 ENCOUNTER — Ambulatory Visit (HOSPITAL_BASED_OUTPATIENT_CLINIC_OR_DEPARTMENT_OTHER): Payer: Medicaid Other | Admitting: Student in an Organized Health Care Education/Training Program

## 2020-04-23 ENCOUNTER — Encounter: Payer: Self-pay | Admitting: Student in an Organized Health Care Education/Training Program

## 2020-04-23 ENCOUNTER — Other Ambulatory Visit: Payer: Self-pay

## 2020-04-23 ENCOUNTER — Ambulatory Visit
Admission: RE | Admit: 2020-04-23 | Discharge: 2020-04-23 | Disposition: A | Discharge status: Home / Self Care | Payer: Medicaid Other | Source: Ambulatory Visit | Attending: Student in an Organized Health Care Education/Training Program | Admitting: Student in an Organized Health Care Education/Training Program

## 2020-04-23 VITALS — BP 157/85 | HR 78 | Temp 97.3°F | Resp 12 | Ht 67.0 in | Wt 171.0 lb

## 2020-04-23 DIAGNOSIS — M47816 Spondylosis without myelopathy or radiculopathy, lumbar region: Secondary | ICD-10-CM | POA: Insufficient documentation

## 2020-04-23 DIAGNOSIS — G894 Chronic pain syndrome: Secondary | ICD-10-CM | POA: Diagnosis not present

## 2020-04-23 MED ORDER — FENTANYL CITRATE (PF) 100 MCG/2ML IJ SOLN
25.0000 ug | INTRAMUSCULAR | Status: DC | PRN
  Administered 2020-04-23: 100 ug via INTRAVENOUS

## 2020-04-23 MED ORDER — MIDAZOLAM HCL 5 MG/5ML IJ SOLN
INTRAMUSCULAR | Status: AC
  Filled 2020-04-23: qty 5

## 2020-04-23 MED ORDER — LIDOCAINE HCL 2 % IJ SOLN
INTRAMUSCULAR | Status: AC
  Filled 2020-04-23: qty 20

## 2020-04-23 MED ORDER — LIDOCAINE HCL 2 % IJ SOLN
20.0000 mL | Freq: Once | INTRAMUSCULAR | Status: AC
  Administered 2020-04-23: 400 mg

## 2020-04-23 MED ORDER — MIDAZOLAM HCL 5 MG/5ML IJ SOLN
1.0000 mg | INTRAMUSCULAR | Status: DC | PRN
  Administered 2020-04-23: 2 mg via INTRAVENOUS

## 2020-04-23 MED ORDER — ROPIVACAINE HCL 2 MG/ML IJ SOLN
9.0000 mL | Freq: Once | INTRAMUSCULAR | Status: AC
  Administered 2020-04-23: 9 mL via PERINEURAL

## 2020-04-23 MED ORDER — ROPIVACAINE HCL 2 MG/ML IJ SOLN
INTRAMUSCULAR | Status: AC
  Filled 2020-04-23: qty 20

## 2020-04-23 MED ORDER — FENTANYL CITRATE (PF) 100 MCG/2ML IJ SOLN
INTRAMUSCULAR | Status: AC
  Filled 2020-04-23: qty 2

## 2020-04-23 MED ORDER — DEXAMETHASONE SODIUM PHOSPHATE 10 MG/ML IJ SOLN
10.0000 mg | Freq: Once | INTRAMUSCULAR | Status: AC
  Administered 2020-04-23: 10 mg

## 2020-04-23 MED ORDER — DEXAMETHASONE SODIUM PHOSPHATE 10 MG/ML IJ SOLN
INTRAMUSCULAR | Status: AC
  Filled 2020-04-23: qty 2

## 2020-04-23 NOTE — Progress Notes (Signed)
Safety precautions to be maintained throughout the outpatient stay will include: orient to surroundings, keep bed in low position, maintain call bell within reach at all times, provide assistance with transfer out of bed and ambulation.  

## 2020-04-23 NOTE — Patient Instructions (Signed)

## 2020-04-23 NOTE — Progress Notes (Signed)
Patient's Name: Ryan [name]  MRN: [id number]  Referring Provider: Saralyn Pilar *  DOB: March 06, 1955  PCP: Smitty Cords, DO  DOS: 04/23/2020  Note by: Edward Jolly, MD  Service setting: Ambulatory outpatient  Specialty: Interventional Pain Management  Patient type: Established  Location: [facility] (AMB) Pain Management Facility  Visit type: Interventional Procedure   Primary Reason for Visit: Interventional Pain Management Treatment. CC: Back Pain (Lumbar bilateral )  Procedure:          Anesthesia, Analgesia, Anxiolysis:  Type: Lumbar Facet, Medial Branch Block(s) #2 2022 Primary Purpose: Therapeutic Region: Posterolateral Lumbosacral Spine Level: L3, L4, L5,  Medial Branch Level(s). Injecting these levels blocks the L3-4, L4-5 lumbar facet joints. Laterality: Bilateral  Type: Moderate (Conscious) Sedation combined with Local Anesthesia Indication(s): Analgesia and Anxiety Route: Intravenous (IV) IV Access: Secured Sedation: Meaningful verbal contact was maintained at all times during the procedure  Local Anesthetic: Lidocaine 1-2%  Position: Prone   Indications: 1. Lumbar spondylosis   2. Lumbar facet arthropathy   3. Chronic pain syndrome    Pain Score: Pre-procedure: 6 /10 Post-procedure: 2 /10  Pre-op Assessment:  Mr. Churilla is a 65 y.o. (year old), male patient, seen today for interventional treatment. He  has a past surgical history that includes Eye surgery; Hernia repair; Transforaminal lumbar interbody fusion (tlif) with pedicle screw fixation 1 level (Right, 06/18/2014); Myringotomy with tube placement (Bilateral, 03/28/2015); Back surgery; Esophagogastroduodenoscopy (egd) with propofol (N/A, 10/24/2015); Cataract extraction w/PHACO (Left, 01/06/2016); Tonsillectomy; Colonoscopy with propofol (N/A, 04/19/2016); and Sinusotomy (02/28/2019). Mr. Preslar has a current medication list which includes the following prescription(s): acetaminophen, alprazolam, aspirin,  atorvastatin, cetirizine, gabapentin, lisinopril, nicotine, proair hfa, sildenafil, tamsulosin, and lisinopril, and the following Facility-Administered Medications: fentanyl and midazolam. His primarily concern today is the Back Pain (Lumbar bilateral )  Initial Vital Signs:  Pulse/HCG Rate: 72ECG Heart Rate: 79 Temp: 97.7 F (36.5 C) Resp: 16 BP: (!) 150/99 SpO2: 98 %  BMI: Estimated body mass index is 26.78 kg/m as calculated from the following:   Height as of this encounter: 5\' 7"  (1.702 m).   Weight as of this encounter: 171 lb (77.6 kg).  Risk Assessment: Allergies: Reviewed. He has No Known Allergies.  Allergy Precautions: None required Coagulopathies: Reviewed. None identified.  Blood-thinner therapy: None at this time Active Infection(s): Reviewed. None identified. Mr. Caras is afebrile  Site Confirmation: Mr. Trier was asked to confirm the procedure and laterality before marking the site Procedure checklist: Completed Consent: Before the procedure and under the influence of no sedative(s), amnesic(s), or anxiolytics, the patient was informed of the treatment options, risks and possible complications. To fulfill our ethical and legal obligations, as recommended by the American Medical Association's Code of Ethics, I have informed the patient of my clinical impression; the nature and purpose of the treatment or procedure; the risks, benefits, and possible complications of the intervention; the alternatives, including doing nothing; the risk(s) and benefit(s) of the alternative treatment(s) or procedure(s); and the risk(s) and benefit(s) of doing nothing. The patient was provided information about the general risks and possible complications associated with the procedure. These may include, but are not limited to: failure to achieve desired goals, infection, bleeding, organ or nerve damage, allergic reactions, paralysis, and death. In addition, the patient was informed of those risks  and complications associated to Spine-related procedures, such as failure to decrease pain; infection (i.e.: Meningitis, epidural or intraspinal abscess); bleeding (i.e.: epidural hematoma, subarachnoid hemorrhage, or any other type of intraspinal  or peri-dural bleeding); organ or nerve damage (i.e.: Any type of peripheral nerve, nerve root, or spinal cord injury) with subsequent damage to sensory, motor, and/or autonomic systems, resulting in permanent pain, numbness, and/or weakness of one or several areas of the body; allergic reactions; (i.e.: anaphylactic reaction); and/or death. Furthermore, the patient was informed of those risks and complications associated with the medications. These include, but are not limited to: allergic reactions (i.e.: anaphylactic or anaphylactoid reaction(s)); adrenal axis suppression; blood sugar elevation that in diabetics may result in ketoacidosis or comma; water retention that in patients with history of congestive heart failure may result in shortness of breath, pulmonary edema, and decompensation with resultant heart failure; weight gain; swelling or edema; medication-induced neural toxicity; particulate matter embolism and blood vessel occlusion with resultant organ, and/or nervous system infarction; and/or aseptic necrosis of one or more joints. Finally, the patient was informed that Medicine is not an exact science; therefore, there is also the possibility of unforeseen or unpredictable risks and/or possible complications that may result in a catastrophic outcome. The patient indicated having understood very clearly. We have given the patient no guarantees and we have made no promises. Enough time was given to the patient to ask questions, all of which were answered to the patient's satisfaction. Mr. Clack has indicated that he wanted to continue with the procedure. Attestation: I, the ordering provider, attest that I have discussed with the patient the benefits, risks,  side-effects, alternatives, likelihood of achieving goals, and potential problems during recovery for the procedure that I have provided informed consent. Date  Time: 04/23/2020 10:52 AM  Pre-Procedure Preparation:  Monitoring: As per clinic protocol. Respiration, ETCO2, SpO2, BP, heart rate and rhythm monitor placed and checked for adequate function Safety Precautions: Patient was assessed for positional comfort and pressure points before starting the procedure. Time-out: I initiated and conducted the "Time-out" before starting the procedure, as per protocol. The patient was asked to participate by confirming the accuracy of the "Time Out" information. Verification of the correct person, site, and procedure were performed and confirmed by me, the nursing staff, and the patient. "Time-out" conducted as per Joint Commission's Universal Protocol (UP.01.01.01). Time: 1136  Description of Procedure:          Laterality: Bilateral. The procedure was performed in identical fashion on both sides. Levels:  L3, L4, L5,  Medial Branch Level(s) Area Prepped: Posterior Lumbosacral Region Prepping solution: ChloraPrep (2% chlorhexidine gluconate and 70% isopropyl alcohol) Safety Precautions: Aspiration looking for blood return was conducted prior to all injections. At no point did we inject any substances, as a needle was being advanced. Before injecting, the patient was told to immediately notify me if he was experiencing any new onset of "ringing in the ears, or metallic taste in the mouth". No attempts were made at seeking any paresthesias. Safe injection practices and needle disposal techniques used. Medications properly checked for expiration dates. SDV (single dose vial) medications used. After the completion of the procedure, all disposable equipment used was discarded in the proper designated medical waste containers. Local Anesthesia: Protocol guidelines were followed. The patient was positioned over the  fluoroscopy table. The area was prepped in the usual manner. The time-out was completed. The target area was identified using fluoroscopy. A 12-in long, straight, sterile hemostat was used with fluoroscopic guidance to locate the targets for each level blocked. Once located, the skin was marked with an approved surgical skin marker. Once all sites were marked, the skin (epidermis, dermis, and  hypodermis), as well as deeper tissues (fat, connective tissue and muscle) were infiltrated with a [place] amount of a short-acting local anesthetic, loaded on a 10cc syringe with a 25G, 1.5-in  Needle. An appropriate amount of time was allowed for local anesthetics to take effect before proceeding to the next step. Local Anesthetic: Lidocaine 2.0% The unused portion of the local anesthetic was discarded in the proper designated containers. Technical explanation of process:   L3 Medial Branch Nerve Block (MBB): The target area for the L3 medial branch is at the junction of the postero-lateral aspect of the superior articular process and the superior, posterior, and medial edge of the transverse process of L4. Under fluoroscopic guidance, a Quincke needle was inserted until contact was made with os over the superior postero-lateral aspect of the pedicular shadow (target area). After negative aspiration for blood,1 cc  mL of the nerve block solution was injected without difficulty or complication. The needle was removed intact. L4 Medial Branch Nerve Block (MBB): The target area for the L4 medial branch is at the junction of the postero-lateral aspect of the superior articular process and the superior, posterior, and medial edge of the transverse process of L5. Under fluoroscopic guidance, a Quincke needle was inserted until contact was made with os over the superior postero-lateral aspect of the pedicular shadow (target area). After negative aspiration for blood,1cc mL of the nerve block solution was injected without  difficulty or complication. The needle was removed intact. L5 Medial Branch Nerve Block (MBB): The target area for the L5 medial branch is at the junction of the postero-lateral aspect of the superior articular process and the superior, posterior, and medial edge of the sacral ala. Under fluoroscopic guidance, a Quincke needle was inserted until contact was made with os over the superior postero-lateral aspect of the pedicular shadow (target area). After negative aspiration for blood,1cc  mL of the nerve block solution was injected without difficulty or complication. The needle was removed intact.   Nerve block solution: 10 cc solution made of 8 cc of 0.2% ropivacaine, 2 cc of Decadron 10 mg/cc.  1-1.5 cc injected at each level above bilaterally  Procedural Needles: 22-gauge, 3.5-inch, Quincke needles used for all levels.  Once the entire procedure was completed, the treated area was cleaned, making sure to leave some of the prepping solution back to take advantage of its long term bactericidal properties.   Illustration of the posterior view of the lumbar spine and the posterior neural structures. Laminae of L2 through S1 are labeled. DPRL5, dorsal primary ramus of L5; DPRS1, dorsal primary ramus of S1; DPR3, dorsal primary ramus of L3; FJ, facet (zygapophyseal) joint L3-L4; I, inferior articular process of L4; LB1, lateral branch of dorsal primary ramus of L1; IAB, inferior articular branches from L3 medial branch (supplies L4-L5 facet joint); IBP, intermediate branch plexus; MB3, medial branch of dorsal primary ramus of L3; NR3, third lumbar nerve root; S, superior articular process of L5; SAB, superior articular branches from L4 (supplies L4-5 facet joint also); TP3, transverse process of L3.  Vitals:   04/23/20 1158 04/23/20 1208 04/23/20 1218 04/23/20 1228  BP: (!) 161/79 (!) 149/104 (!) 162/80 (!) 157/85  Pulse:      Resp: 12 16 14 12   Temp: 97.8 F (36.6 C)   (!) 97.3 F (36.3 C)   TempSrc:      SpO2: 100% 100% 100% 100%  Weight:      Height:  Start Time: 1136 hrs. End Time: 1149 hrs.  Imaging Guidance (Spinal):          Type of Imaging Technique: Fluoroscopy Guidance (Spinal) Indication(s): Assistance in needle guidance and placement for procedures requiring needle placement in or near specific anatomical locations not easily accessible without such assistance. Exposure Time: Please see nurses notes. Contrast: None used. Fluoroscopic Guidance: I was personally present during the use of fluoroscopy. "Tunnel Vision Technique" used to obtain the best possible view of the target area. Parallax error corrected before commencing the procedure. "Direction-depth-direction" technique used to introduce the needle under continuous pulsed fluoroscopy. Once target was reached, antero-posterior, oblique, and lateral fluoroscopic projection used confirm needle placement in all planes. Images permanently stored in EMR. Interpretation: No contrast injected. I personally interpreted the imaging intraoperatively. Adequate needle placement confirmed in multiple planes. Permanent images saved into the patient's record.   Post-operative Assessment:  Post-procedure Vital Signs:  Pulse/HCG Rate: 7869 Temp: (!) 97.3 F (36.3 C) Resp: 12 BP: (!) 157/85 SpO2: 100 %  EBL: None  Complications: No immediate post-treatment complications observed by team, or reported by patient.  Note: The patient tolerated the entire procedure well. A repeat set of vitals were taken after the procedure and the patient was kept under observation following institutional policy, for this type of procedure. Post-procedural neurological assessment was performed, showing return to baseline, prior to discharge. The patient was provided with post-procedure discharge instructions, including a section on how to identify potential problems. Should any problems arise concerning this procedure, the patient was  given instructions to immediately contact us, at any time, without hesitation. In any case, we plan to contact the patient by telephone for a follow-up status report regarding this interventional procedure.  Comments:  No additional relevant information.  Plan of Care    Imaging Orders     DG PAIN CLINIC C-ARM 1-60 MIN NO REPORT  Medications ordered for procedure: Meds ordered this encounter  Medications  . lidocaine (XYLOCAINE) 2 % (with pres) injection 400 mg  . midazolam (VERSED) 5 MG/5ML injection 1-2 mg    Make sure Flumazenil is available in the pyxis when using this medication. If oversedation occurs, administer 0.2 mg IV over 15 sec. If after 45 sec no response, administer 0.2 mg again over 1 min; may repeat at 1 min intervals; not to exceed 4 doses (1 mg)  . fentaNYL (SUBLIMAZE) injection 25-50 mcg    Make sure Narcan is available in the pyxis when using this medication. In the event of respiratory depression (RR< 8/min): Titrate NARCAN (naloxone) in increments of 0.1 to 0.2 mg IV at 2-3 minute intervals, until desired degree of reversal.  . ropivacaine (PF) 2 mg/mL (0.2%) (NAROPIN) injection 9 mL  . dexamethasone (DECADRON) injection 10 mg  . ropivacaine (PF) 2 mg/mL (0.2%) (NAROPIN) injection 9 mL  . dexamethasone (DECADRON) injection 10 mg   Medications administered: We administered lidocaine, midazolam, fentaNYL, ropivacaine (PF) 2 mg/mL (0.2%), dexamethasone, ropivacaine (PF) 2 mg/mL (0.2%), and dexamethasone.  See the medical record for exact dosing, route, and time of administration.  Disposition: Discharge home  Discharge Date & Time: 04/23/2020; 1230 hrs.   Physician-requested Follow-up: Return in about 10 weeks (around 07/02/2020) for Post Procedure Evaluation, virtual.  Future Appointments  Date Time Provider Department Center  07/01/2020  3:20 PM Edward Jolly, MD [facility] None   Primary Care Physician: Smitty Cords, DO Location: [facility] Out[facility]  Management Facility Note by: Edward Jolly, MD Date: 04/23/2020; Time: 12:48  PM  Disclaimer:  Medicine is not an Chief Strategy Officer. The only guarantee in medicine is that nothing is guaranteed. It is important to note that the decision to proceed with this intervention was based on the information collected from the patient. The Data and conclusions were drawn from the patient's questionnaire, the interview, and the physical examination. Because the information was provided in large part by the patient, it cannot be guaranteed that it has not been purposely or unconsciously manipulated. Every effort has been made to obtain as much relevant data as possible for this evaluation. It is important to note that the conclusions that lead to this procedure are derived in large part from the available data. Always take into account that the treatment will also be dependent on availability of resources and existing treatment guidelines, considered by other Pain Management Practitioners as being common knowledge and practice, at the time of the intervention. For Medico-Legal purposes, it is also important to point out that variation in procedural techniques and pharmacological choices are the acceptable norm. The indications, contraindications, technique, and results of the above procedure should only be interpreted and judged by a Board-Certified Interventional Pain Specialist with extensive familiarity and expertise in the same exact procedure and technique.

## 2020-04-24 ENCOUNTER — Telehealth: Payer: Self-pay | Admitting: *Deleted

## 2020-04-24 NOTE — Telephone Encounter (Signed)
Spoke with patient's wife, she states Ryan [name] is still sleeping.  States he did very well on yesterday.  Will let him know that I called and if he has any questions or concerns he will call the office.

## 2020-05-14 DIAGNOSIS — R399 Unspecified symptoms and signs involving the genitourinary system: Secondary | ICD-10-CM | POA: Diagnosis not present

## 2020-05-14 DIAGNOSIS — Z125 Encounter for screening for malignant neoplasm of prostate: Secondary | ICD-10-CM | POA: Diagnosis not present

## 2020-05-14 DIAGNOSIS — N529 Male erectile dysfunction, unspecified: Secondary | ICD-10-CM | POA: Diagnosis not present

## 2020-05-15 ENCOUNTER — Other Ambulatory Visit: Payer: Self-pay | Admitting: Family Medicine

## 2020-05-15 DIAGNOSIS — J432 Centrilobular emphysema: Secondary | ICD-10-CM

## 2020-05-15 DIAGNOSIS — I10 Essential (primary) hypertension: Secondary | ICD-10-CM

## 2020-05-15 NOTE — Telephone Encounter (Signed)
Requested Prescriptions  Pending Prescriptions Disp Refills  . PROAIR HFA 108 (90 Base) MCG/ACT inhaler [Pharmacy Med Name: ProAir HFA 108 (90 Base) MCG/ACT Inhalation Aerosol Solution] 9 g 0    Sig: INHALE 2 PUFFS BY MOUTH EVERY 6 HOURS AS NEEDED FOR WHEEZING FOR SHORTNESS OF BREATH     Pulmonology:  Beta Agonists Failed - 05/15/2020  9:52 AM      Failed - One inhaler should last at least one month. If the patient is requesting refills earlier, contact the patient to check for uncontrolled symptoms.      Passed - Valid encounter within last 12 months    Recent Outpatient Visits          2 months ago Loose stools   [facility] Caro Laroche, DO   3 months ago Pain and swelling of right knee   [facility] [place], Netta Neat, DO   6 months ago Essential hypertension   [facility] [place], Netta Neat, DO      Future Appointments            In 1 month Edward Jolly, MD [facility]           . lisinopril (ZESTRIL) 20 MG tablet [Pharmacy Med Name: Lisinopril 20 MG Oral Tablet] 90 tablet 0    Sig: Take 1 tablet by mouth once daily     Cardiovascular:  ACE Inhibitors Failed - 05/15/2020  9:52 AM      Failed - Cr in normal range and within 180 days    Creatinine  Date Value Ref Range Status  07/15/2011 1.15 0.60 - 1.30 mg/dL Final   Creatinine, Ser  Date Value Ref Range Status  09/11/2019 1.26 (H) 0.61 - 1.24 mg/dL Final         Failed - K in normal range and within 180 days    Potassium  Date Value Ref Range Status  09/11/2019 3.5 3.5 - 5.1 mmol/L Final  07/15/2011 4.3 3.5 - 5.1 mmol/L Final         Failed - Last BP in normal range    BP Readings from Last 1 Encounters:  04/23/20 (!) 157/85         Passed - Patient is not pregnant      Passed - Valid encounter within last 6 months    Recent Outpatient Visits          2 months ago Loose stools   [facility] Caro Laroche, DO   3 months ago Pain and swelling of right knee   [facility] Smitty Cords, DO   6 months ago Essential hypertension   [facility] [place], Netta Neat, DO      Future Appointments            In 1 month Edward Jolly, MD [facility]

## 2020-06-13 ENCOUNTER — Other Ambulatory Visit: Payer: Self-pay | Admitting: Family Medicine

## 2020-06-13 DIAGNOSIS — E782 Mixed hyperlipidemia: Secondary | ICD-10-CM

## 2020-06-13 NOTE — Telephone Encounter (Signed)
Requested Prescriptions  Pending Prescriptions Disp Refills  . atorvastatin (LIPITOR) 80 MG tablet [Pharmacy Med Name: Atorvastatin Calcium 80 MG Oral Tablet] 90 tablet 0    Sig: TAKE 1 TABLET BY MOUTH AT BEDTIME     Cardiovascular:  Antilipid - Statins Failed - 06/13/2020  1:48 PM      Failed - LDL in normal range and within 360 days    LDL Cholesterol  Date Value Ref Range Status  09/10/2019 123 (H) 0 - 99 mg/dL Final    Comment:           Total Cholesterol/HDL:CHD Risk Coronary Heart Disease Risk Table                     Men   Women  1/2 Average Risk   3.4   3.3  Average Risk       5.0   4.4  2 X Average Risk   9.6   7.1  3 X Average Risk  23.4   11.0        Use the calculated Patient Ratio above and the CHD Risk Table to determine the patient's CHD Risk.        ATP III CLASSIFICATION (LDL):  <100     mg/dL   Optimal  762-263  mg/dL   Near or Above                    Optimal  130-159  mg/dL   Borderline  335-456  mg/dL   High  >256     mg/dL   Very High Performed at [facility], [address] [place], [place] [address]          Failed - HDL in normal range and within 360 days    HDL  Date Value Ref Range Status  09/10/2019 38 (L) >40 mg/dL Final         Passed - Total Cholesterol in normal range and within 360 days    Cholesterol  Date Value Ref Range Status  09/10/2019 185 0 - 200 mg/dL Final         Passed - Triglycerides in normal range and within 360 days    Triglycerides  Date Value Ref Range Status  09/10/2019 122 <150 mg/dL Final         Passed - Patient is not pregnant      Passed - Valid encounter within last 12 months    Recent Outpatient Visits          3 months ago Loose stools   [facility] Caro Laroche, DO   4 months ago Pain and swelling of right knee   [facility] [place], Netta Neat, DO   7 months ago Essential hypertension   [facility] [place], Netta Neat, DO       Future Appointments            In 2 weeks Edward Jolly, MD [facility]

## 2020-06-17 DIAGNOSIS — Z79899 Other long term (current) drug therapy: Secondary | ICD-10-CM | POA: Diagnosis not present

## 2020-06-17 DIAGNOSIS — F331 Major depressive disorder, recurrent, moderate: Secondary | ICD-10-CM | POA: Diagnosis not present

## 2020-06-17 DIAGNOSIS — F411 Generalized anxiety disorder: Secondary | ICD-10-CM | POA: Diagnosis not present

## 2020-06-26 ENCOUNTER — Other Ambulatory Visit: Payer: Self-pay | Admitting: Family Medicine

## 2020-06-26 DIAGNOSIS — J432 Centrilobular emphysema: Secondary | ICD-10-CM

## 2020-06-30 ENCOUNTER — Encounter: Payer: Self-pay | Admitting: Student in an Organized Health Care Education/Training Program

## 2020-07-01 ENCOUNTER — Other Ambulatory Visit: Payer: Self-pay

## 2020-07-01 ENCOUNTER — Encounter: Payer: Self-pay | Admitting: Student in an Organized Health Care Education/Training Program

## 2020-07-01 ENCOUNTER — Ambulatory Visit
Payer: Medicaid Other | Attending: Student in an Organized Health Care Education/Training Program | Admitting: Student in an Organized Health Care Education/Training Program

## 2020-07-01 DIAGNOSIS — G894 Chronic pain syndrome: Secondary | ICD-10-CM

## 2020-07-01 DIAGNOSIS — M47816 Spondylosis without myelopathy or radiculopathy, lumbar region: Secondary | ICD-10-CM

## 2020-07-01 NOTE — Progress Notes (Signed)
Patient: Ryan [name]  Service Category: E/M  Provider: [name], MD  DOB: 1955/03/02  DOS: 07/01/2020  Location: Office  MRN: [id number]  Setting: Ambulatory outpatient  Referring Provider: Nobie Putnam *  Type: Established Patient  Specialty: Interventional Pain Management  PCP: Olin Hauser, DO  Location: Home  Delivery: TeleHealth     Virtual Encounter - Pain Management PROVIDER NOTE: Information contained herein reflects review and annotations entered in association with encounter. Interpretation of such information and data should be left to medically-trained personnel. Information provided to patient can be located elsewhere in the medical record under "Patient Instructions". Document created using STT-dictation technology, any transcriptional errors that may result from process are unintentional.    Contact & Pharmacy Preferred: [phone] Home: [phone] (home) Mobile: There is no such number on file (mobile). E-mail: [email or web address]  [facility] [address] ([address]), [place] [address] [place] [place] [address] Phone: [phone] Fax: [phone]   Pre-screening  Ryan [name] offered "in-person" vs "virtual" encounter. He indicated preferring virtual for this encounter.   Reason COVID-19*  Social distancing based on CDC and AMA recommendations.   I contacted Ryan [name] on 07/01/2020 via video conference.      I clearly identified myself as 65/07/2020, MD. I verified that I was speaking with the correct person using two identifiers (Name: Ryan [name], and date of birth: 10/03/1955).  Consent I sought verbal advanced consent from 65/31/1957 for virtual visit interactions. I informed Ryan [name] of possible security and privacy concerns, risks, and limitations associated with providing "not-in-person" medical evaluation and management services. I also informed Ryan [name] of the availability of  "in-person" appointments. Finally, I informed him that there would be a charge for the virtual visit and that he could be  personally, fully or partially, financially responsible for it. [name] expressed understanding and agreed to proceed.   Historic Elements   Ryan [name] is a 65 y.o. year old, male patient evaluated today after our last contact on 04/23/2020. Ryan [name]  has a past medical history of Anxiety, Arthritis, Asthma, Chronic kidney disease, COPD (chronic obstructive pulmonary disease) ([place]), Dyspnea, GERD (gastroesophageal reflux disease), Hyperlipidemia, Hypertension, Lower urinary tract symptoms (LUTS), Prostate disease, Stroke [facility]), Thyroid disease, Wears dentures, and Wears hearing aid. He also  has a past surgical history that includes Eye surgery; Hernia repair; Transforaminal lumbar interbody fusion (tlif) with pedicle screw fixation 1 level (Right, 06/18/2014); Myringotomy with tube placement (Bilateral, 03/28/2015); Back surgery; Esophagogastroduodenoscopy (egd) with propofol (N/A, 10/24/2015); Cataract extraction w/PHACO (Left, 01/06/2016); Tonsillectomy; Colonoscopy with propofol (N/A, 04/19/2016); and Sinusotomy (02/28/2019). Ryan [name] has a current medication list which includes the following prescription(s): acetaminophen, alprazolam, aspirin, atorvastatin, cetirizine, gabapentin, lisinopril, lisinopril, nicotine, proair hfa, sildenafil, and tamsulosin. He  reports that he has been smoking cigarettes. He has a 60.00 pack-year smoking history. He uses smokeless tobacco. He reports current alcohol use. He reports that he does not use drugs. Ryan [name] has No Known Allergies.   HPI  Today, he is being contacted for a post-procedure assessment.   Post-Procedure Evaluation  Procedure (04/23/2020):   Type: Lumbar Facet, Medial Branch Block(s) #2 2022 Primary Purpose: Therapeutic Region: Posterolateral Lumbosacral Spine Level: L3, L4, L5,  Medial Branch Level(s). Injecting these  levels blocks the L3-4, L4-5 lumbar facet joints. Laterality: Bilateral  Sedation: Please see nurses note.  Effectiveness during initial hour after procedure(Ultra-Short Term Relief): 100 %  Local anesthetic  used: Long-acting (4-6 hours) Effectiveness: Defined as any analgesic benefit obtained secondary to the administration of local anesthetics. This carries significant diagnostic value as to the etiological location, or anatomical origin, of the pain. Duration of benefit is expected to coincide with the duration of the local anesthetic used.  Effectiveness during initial 4-6 hours after procedure(Short-Term Relief): 100 %   Long-term benefit: Defined as any relief past the pharmacologic duration of the local anesthetics.  Effectiveness past the initial 6 hours after procedure(Long-Term Relief): 60 % (6 weeks) .  Current benefits: Defined as benefit that persist at this time.   Analgesia:  <50% better Function: Ryan [name] reports improvement in function ROM: Ryan [name] reports improvement in ROM   Laboratory Chemistry Profile   Renal Lab Results  Component Value Date   BUN 17 09/11/2019   CREATININE 1.26 (H) 09/11/2019   GFRAA >60 09/11/2019   GFRNONAA >60 09/11/2019     Hepatic Lab Results  Component Value Date   AST 19 09/10/2019   ALT 15 09/10/2019   ALBUMIN 5.0 09/10/2019   ALKPHOS 133 (H) 09/10/2019   LIPASE 77 (H) 10/19/2017     Electrolytes Lab Results  Component Value Date   NA 133 (L) 09/11/2019   K 3.5 09/11/2019   CL 99 09/11/2019   CALCIUM 8.6 (L) 09/11/2019     Bone No results found for: VD25OH, VD125OH2TOT, QB1694HW3, UU8280KL4, 25OHVITD1, 25OHVITD2, 25OHVITD3, TESTOFREE, TESTOSTERONE   Inflammation (CRP: Acute Phase) (ESR: Chronic Phase) No results found for: CRP, ESRSEDRATE, LATICACIDVEN     Note: Above Lab results reviewed.   Assessment  The primary encounter diagnosis was Lumbar spondylosis. Diagnoses of Lumbar facet arthropathy and Chronic  pain syndrome were also pertinent to this visit.  Plan of Care   Ryan [name] has a current medication list which includes the following long-term medication(s): atorvastatin, cetirizine, gabapentin, lisinopril, lisinopril, and proair hfa.   Orders:  Orders Placed This Encounter  Procedures  . LUMBAR FACET(MEDIAL BRANCH NERVE BLOCK) MBNB    Standing Status:   Future    Standing Expiration Date:   07/31/2020    Scheduling Instructions:     Procedure: Lumbar facet block (AKA.: Lumbosacral medial branch nerve block)     Side: Bilateral     Level: L3-4, L4-5,Facets (L3, L4, L5, Medial Branch Nerves)     Sedation: with     Timeframe: ASAA    Order Specific Question:   Where will this procedure be performed?    Answer:   [facility]   Follow-up plan:   Return in about 4 weeks (around 07/29/2020) for B/L L3, 4, 5 MBNB , with sedation.     Status post bilateral L3, L4, L5, S1 lumbar facet medial branch nerve blocks on 03/20/2018 helped 85% pain relief for approximately 1 year. Status post left L3, L4, L5 RFA on 07/11/2019, Right L3, L4, L5 RFA 07/25/19. Not as helpful as lumbar facet MBBs- repeat PRN, consider SPRINT PNS           Recent Visits Date Type Provider Dept  04/23/20 Procedure visit [name], MD [facility]  Showing recent visits within past 90 days and meeting all other requirements Today's Visits Date Type Provider Dept  07/01/20 Telemedicine [name], MD [facility]  Showing today's visits and meeting all other requirements Future Appointments No visits were found meeting these conditions. Showing future appointments within next 90 days and meeting all other requirements  I discussed the assessment  and treatment plan with the patient. The patient was provided an opportunity to ask questions and all were answered. The patient agreed with the plan and demonstrated an understanding of the instructions.  Patient advised to call back or seek  an in-person evaluation if the symptoms or condition worsens.  Duration of encounter: 22mnutes.  Note by: BGillis Santa MD Date: 07/01/2020; Time: 2:55 PM

## 2020-07-04 ENCOUNTER — Other Ambulatory Visit: Payer: Self-pay

## 2020-07-04 ENCOUNTER — Emergency Department: Payer: Medicaid Other

## 2020-07-04 DIAGNOSIS — Z5321 Procedure and treatment not carried out due to patient leaving prior to being seen by health care provider: Secondary | ICD-10-CM | POA: Diagnosis not present

## 2020-07-04 DIAGNOSIS — R519 Headache, unspecified: Secondary | ICD-10-CM | POA: Diagnosis not present

## 2020-07-04 LAB — CBC WITH DIFFERENTIAL/PLATELET
Abs Immature Granulocytes: 0.03 10*3/uL (ref 0.00–0.07)
Basophils Absolute: 0.1 10*3/uL (ref 0.0–0.1)
Basophils Relative: 1 %
Eosinophils Absolute: 0.3 10*3/uL (ref 0.0–0.5)
Eosinophils Relative: 2 %
HCT: 45.9 % (ref 39.0–52.0)
Hemoglobin: 15.5 g/dL (ref 13.0–17.0)
Immature Granulocytes: 0 %
Lymphocytes Relative: 31 %
Lymphs Abs: 3.3 10*3/uL (ref 0.7–4.0)
MCH: 31.7 pg (ref 26.0–34.0)
MCHC: 33.8 g/dL (ref 30.0–36.0)
MCV: 93.9 fL (ref 80.0–100.0)
Monocytes Absolute: 0.6 10*3/uL (ref 0.1–1.0)
Monocytes Relative: 6 %
Neutro Abs: 6.5 10*3/uL (ref 1.7–7.7)
Neutrophils Relative %: 60 %
Platelets: 225 10*3/uL (ref 150–400)
RBC: 4.89 MIL/uL (ref 4.22–5.81)
RDW: 13.5 % (ref 11.5–15.5)
WBC: 10.8 10*3/uL — ABNORMAL HIGH (ref 4.0–10.5)
nRBC: 0 % (ref 0.0–0.2)

## 2020-07-04 LAB — COMPREHENSIVE METABOLIC PANEL
ALT: 16 U/L (ref 0–44)
AST: 19 U/L (ref 15–41)
Albumin: 4.6 g/dL (ref 3.5–5.0)
Alkaline Phosphatase: 102 U/L (ref 38–126)
Anion gap: 9 (ref 5–15)
BUN: 10 mg/dL (ref 8–23)
CO2: 27 mmol/L (ref 22–32)
Calcium: 9 mg/dL (ref 8.9–10.3)
Chloride: 105 mmol/L (ref 98–111)
Creatinine, Ser: 0.96 mg/dL (ref 0.61–1.24)
GFR, Estimated: >60 mL/min (ref >60)
Glucose, Bld: 97 mg/dL (ref 70–99)
Potassium: 3.7 mmol/L (ref 3.5–5.1)
Sodium: 141 mmol/L (ref 135–145)
Total Bilirubin: 0.5 mg/dL (ref 0.3–1.2)
Total Protein: 7.2 g/dL (ref 6.5–8.1)

## 2020-07-04 NOTE — ED Triage Notes (Signed)
Pt presents to ER c/o headache x3 weeks, and he has been hypertensive.  Pt states he had stroke in august last august and is worried about it being something wrong.  Pt states he has been taking BC powder without relief.  Pt denies any other neurological symptoms since the HA started.  Pt A&O x4 at this time.

## 2020-07-05 ENCOUNTER — Emergency Department
Admission: EM | Admit: 2020-07-05 | Discharge: 2020-07-05 | Discharge status: Against Medical Advice | Payer: Medicaid Other | Attending: Emergency Medicine | Admitting: Emergency Medicine

## 2020-07-05 MED ORDER — ACETAMINOPHEN 500 MG PO TABS
ORAL_TABLET | ORAL | Status: AC
  Administered 2020-07-05: 1000 mg via ORAL
  Filled 2020-07-05: qty 2

## 2020-07-05 MED ORDER — ACETAMINOPHEN 500 MG PO TABS: 1000.0000 mg | ORAL_TABLET | Freq: Once | ORAL | Status: AC

## 2020-07-05 NOTE — ED Notes (Signed)
Pt updated multiple times regarding wait. Pt ambulatory without difficulty.

## 2020-07-05 NOTE — ED Notes (Signed)
Informed by ed tech that pt is leaving.

## 2020-07-10 DIAGNOSIS — F341 Dysthymic disorder: Secondary | ICD-10-CM | POA: Diagnosis not present

## 2020-07-10 DIAGNOSIS — F411 Generalized anxiety disorder: Secondary | ICD-10-CM | POA: Diagnosis not present

## 2020-07-17 ENCOUNTER — Other Ambulatory Visit: Payer: Self-pay | Admitting: Family Medicine

## 2020-07-17 DIAGNOSIS — J432 Centrilobular emphysema: Secondary | ICD-10-CM

## 2020-07-17 NOTE — Telephone Encounter (Signed)
Interface surescripts requesting refill. Receipt confirmed by pharmacy 06/26/20 at 2:10 pm

## 2020-07-18 DIAGNOSIS — F332 Major depressive disorder, recurrent severe without psychotic features: Secondary | ICD-10-CM | POA: Diagnosis not present

## 2020-07-18 DIAGNOSIS — F411 Generalized anxiety disorder: Secondary | ICD-10-CM | POA: Diagnosis not present

## 2020-07-23 ENCOUNTER — Ambulatory Visit (HOSPITAL_BASED_OUTPATIENT_CLINIC_OR_DEPARTMENT_OTHER): Payer: Medicaid Other | Admitting: Student in an Organized Health Care Education/Training Program

## 2020-07-23 ENCOUNTER — Encounter: Payer: Self-pay | Admitting: Student in an Organized Health Care Education/Training Program

## 2020-07-23 ENCOUNTER — Ambulatory Visit
Admission: RE | Admit: 2020-07-23 | Discharge: 2020-07-23 | Disposition: A | Discharge status: Home / Self Care | Payer: Medicaid Other | Source: Ambulatory Visit | Attending: Student in an Organized Health Care Education/Training Program | Admitting: Student in an Organized Health Care Education/Training Program

## 2020-07-23 ENCOUNTER — Other Ambulatory Visit: Payer: Self-pay

## 2020-07-23 DIAGNOSIS — M47816 Spondylosis without myelopathy or radiculopathy, lumbar region: Secondary | ICD-10-CM | POA: Insufficient documentation

## 2020-07-23 DIAGNOSIS — G894 Chronic pain syndrome: Secondary | ICD-10-CM | POA: Insufficient documentation

## 2020-07-23 MED ORDER — DEXAMETHASONE SODIUM PHOSPHATE 10 MG/ML IJ SOLN
INTRAMUSCULAR | Status: AC
  Filled 2020-07-23: qty 2

## 2020-07-23 MED ORDER — DEXAMETHASONE SODIUM PHOSPHATE 10 MG/ML IJ SOLN
10.0000 mg | Freq: Once | INTRAMUSCULAR | Status: AC
  Administered 2020-07-23: 10 mg

## 2020-07-23 MED ORDER — ROPIVACAINE HCL 2 MG/ML IJ SOLN
9.0000 mL | Freq: Once | INTRAMUSCULAR | Status: AC
  Administered 2020-07-23: 9 mL via PERINEURAL

## 2020-07-23 MED ORDER — ROPIVACAINE HCL 2 MG/ML IJ SOLN
INTRAMUSCULAR | Status: AC
  Filled 2020-07-23: qty 20

## 2020-07-23 MED ORDER — MIDAZOLAM HCL 5 MG/5ML IJ SOLN
INTRAMUSCULAR | Status: AC
  Filled 2020-07-23: qty 5

## 2020-07-23 MED ORDER — MIDAZOLAM HCL 5 MG/5ML IJ SOLN
1.0000 mg | INTRAMUSCULAR | Status: DC | PRN
  Administered 2020-07-23: 3 mg via INTRAVENOUS

## 2020-07-23 MED ORDER — LIDOCAINE HCL 2 % IJ SOLN
INTRAMUSCULAR | Status: AC
  Filled 2020-07-23: qty 20

## 2020-07-23 MED ORDER — LIDOCAINE HCL 2 % IJ SOLN
20.0000 mL | Freq: Once | INTRAMUSCULAR | Status: AC
  Administered 2020-07-23: 400 mg

## 2020-07-23 NOTE — Progress Notes (Signed)
Safety precautions to be maintained throughout the outpatient stay will include: orient to surroundings, keep bed in low position, maintain call bell within reach at all times, provide assistance with transfer out of bed and ambulation.  

## 2020-07-23 NOTE — Patient Instructions (Signed)
____________________________________________________________________________________________  Post-Procedure Discharge Instructions  Instructions:  Apply ice:   Purpose: This will minimize any swelling and discomfort after procedure.   When: Day of procedure, as soon as you get home.  How: Fill a plastic sandwich bag with crushed ice. Cover it with a Noy towel and apply to injection site.  How long: (15 min on, 15 min off) Apply for 15 minutes then remove x 15 minutes.  Repeat sequence on day of procedure, until you go to bed.  Apply heat:   Purpose: To treat any soreness and discomfort from the procedure.  When: Starting the next day after the procedure.  How: Apply heat to procedure site starting the day following the procedure.  How long: May continue to repeat daily, until discomfort goes away.  Food intake: Start with clear liquids (like water) and advance to regular food, as tolerated.   Physical activities: Keep activities to a minimum for the first 8 hours after the procedure. After that, then as tolerated.  Driving: If you have received any sedation, be responsible and do not drive. You are not allowed to drive for 24 hours after having sedation.  Blood thinner: (Applies only to those taking blood thinners) You may restart your blood thinner 6 hours after your procedure.  Insulin: (Applies only to Diabetic patients taking insulin) As soon as you can eat, you may resume your normal dosing schedule.  Infection prevention: Keep procedure site clean and dry. Shower daily and clean area with soap and water.  Post-procedure Pain Diary: Extremely important that this be done correctly and accurately. Recorded information will be used to determine the next step in treatment. For the purpose of accuracy, follow these rules:  Evaluate only the area treated. Do not report or include pain from an untreated area. For the purpose of this evaluation, ignore all other areas of pain,  except for the treated area.  After your procedure, avoid taking a long nap and attempting to complete the pain diary after you wake up. Instead, set your alarm clock to go off every hour, on the hour, for the initial 8 hours after the procedure. Document the duration of the numbing medicine, and the relief you are getting from it.  Do not go to sleep and attempt to complete it later. It will not be accurate. If you received sedation, it is likely that you were given a medication that may cause amnesia. Because of this, completing the diary at a later time may cause the information to be inaccurate. This information is needed to plan your care.  Follow-up appointment: Keep your post-procedure follow-up evaluation appointment after the procedure (usually 2 weeks for most procedures, 6 weeks for radiofrequencies). DO NOT FORGET to bring you pain diary with you.   Expect: (What should I expect to see with my procedure?)  From numbing medicine (AKA: Local Anesthetics): Numbness or decrease in pain. You may also experience some weakness, which if present, could last for the duration of the local anesthetic.  Onset: Full effect within 15 minutes of injected.  Duration: It will depend on the type of local anesthetic used. On the average, 1 to 8 hours.   From steroids (Applies only if steroids were used): Decrease in swelling or inflammation. Once inflammation is improved, relief of the pain will follow.  Onset of benefits: Depends on the amount of swelling present. The more swelling, the longer it will take for the benefits to be seen. In some cases, up to 10 days.    Duration: Steroids will stay in the system x 2 weeks. Duration of benefits will depend on multiple posibilities including persistent irritating factors.  Side-effects: If present, they may typically last 2 weeks (the duration of the steroids).  Frequent: Cramps (if they occur, drink Gatorade and take over-the-counter Magnesium 450-500 mg  once to twice a day); water retention with temporary weight gain; increases in blood sugar; decreased immune system response; increased appetite.  Occasional: Facial flushing (red, warm cheeks); mood swings; menstrual changes.  Uncommon: Long-term decrease or suppression of natural hormones; bone thinning. (These are more common with higher doses or more frequent use. This is why we prefer that our patients avoid having any injection therapies in other practices.)   Very Rare: Severe mood changes; psychosis; aseptic necrosis.  From procedure: Some discomfort is to be expected once the numbing medicine wears off. This should be minimal if ice and heat are applied as instructed.  Call if: (When should I call?)  You experience numbness and weakness that gets worse with time, as opposed to wearing off.  New onset bowel or bladder incontinence. (Applies only to procedures done in the spine)  Emergency Numbers:  Durning business hours (Monday - Thursday, 8:00 AM - 4:00 PM) (Friday, 9:00 AM - 12:00 Noon): [phone]  After hours: [phone]  NOTE: If you are having a problem and are unable connect with, or to talk to a provider, then go to your nearest urgent care or emergency department. If the problem is serious and urgent, please call 911. ____________________________________________________________________________________________    

## 2020-07-23 NOTE — Progress Notes (Signed)
Patient's Name: Ryan [name]  MRN: [id number]  Referring Provider: Edward Jolly, MD  DOB: [birth year]  PCP: Smitty Cords, DO  DOS: 07/23/2020  Note by: Edward Jolly, MD  Service setting: Ambulatory outpatient  Specialty: Interventional Pain Management  Patient type: Established  Location: [facility] (AMB) Pain Management Facility  Visit type: Interventional Procedure   Primary Reason for Visit: Interventional Pain Management Treatment. CC: Back Pain (Bilateral lumbar )  Procedure:          Anesthesia, Analgesia, Anxiolysis:  Type: Lumbar Facet, Medial Branch Block(s) #3 2022 Primary Purpose: Therapeutic Region: Posterolateral Lumbosacral Spine Level: L3, L4, L5,  Medial Branch Level(s). Injecting these levels blocks the L3-4, L4-5 lumbar facet joints. Laterality: Bilateral  Type: Anxiolysis: IV Versed only Route: Intravenous (IV) IV Access: Secured Sedation: Meaningful verbal contact was maintained at all times during the procedure  Local Anesthetic: Lidocaine 1-2%  Position: Prone   Indications: 1. Lumbar spondylosis   2. Lumbar facet arthropathy   3. Chronic pain syndrome    Pain Score: Pre-procedure: 7 /10 Post-procedure: 3  (moving and getting in wheelchair)/10  Pre-op Assessment:  Ryan [name] is a 65 y.o. (year old), male patient, seen today for interventional treatment. He  has a past surgical history that includes Eye surgery; Hernia repair; Transforaminal lumbar interbody fusion (tlif) with pedicle screw fixation 1 level (Right, 06/18/2014); Myringotomy with tube placement (Bilateral, 03/28/2015); Back surgery; Esophagogastroduodenoscopy (egd) with propofol (N/A, 10/24/2015); Cataract extraction w/PHACO (Left, 01/06/2016); Tonsillectomy; Colonoscopy with propofol (N/A, 04/19/2016); and Sinusotomy (02/28/2019). Ryan [name] has a current medication list which includes the following prescription(s): acetaminophen, alprazolam, aspirin, atorvastatin, cetirizine, desvenlafaxine,  gabapentin, lisinopril, nicotine, proair hfa, sildenafil, tamsulosin, and lisinopril, and the following Facility-Administered Medications: midazolam. His primarily concern today is the Back Pain (Bilateral lumbar )  Initial Vital Signs:  Pulse/HCG Rate: 88ECG Heart Rate: 71 Temp: (!) 97.1 F (36.2 C) Resp: 16 BP: 116/77 SpO2: 99 %  BMI: Estimated body mass index is 26.63 kg/m as calculated from the following:   Height as of this encounter:  (1.702 m).   Weight as of this encounter: 170 lb (77.1 kg).  Risk Assessment: Allergies: Reviewed. He has No Known Allergies.  Allergy Precautions: None required Coagulopathies: Reviewed. None identified.  Blood-thinner therapy: None at this time Active Infection(s): Reviewed. None identified. Ryan [name] is afebrile  Site Confirmation: Ryan [name] was asked to confirm the procedure and laterality before marking the site Procedure checklist: Completed Consent: Before the procedure and under the influence of no sedative(s), amnesic(s), or anxiolytics, the patient was informed of the treatment options, risks and possible complications. To fulfill our ethical and legal obligations, as recommended by the American Medical Association's Code of Ethics, I have informed the patient of my clinical impression; the nature and purpose of the treatment or procedure; the risks, benefits, and possible complications of the intervention; the alternatives, including doing nothing; the risk(s) and benefit(s) of the alternative treatment(s) or procedure(s); and the risk(s) and benefit(s) of doing nothing. The patient was provided information about the general risks and possible complications associated with the procedure. These may include, but are not limited to: failure to achieve desired goals, infection, bleeding, organ or nerve damage, allergic reactions, paralysis, and death. In addition, the patient was informed of those risks and complications associated to  Spine-related procedures, such as failure to decrease pain; infection (i.e.: Meningitis, epidural or intraspinal abscess); bleeding (i.e.: epidural hematoma, subarachnoid hemorrhage, or any other type of intraspinal or peri-dural bleeding);  organ or nerve damage (i.e.: Any type of peripheral nerve, nerve root, or spinal cord injury) with subsequent damage to sensory, motor, and/or autonomic systems, resulting in permanent pain, numbness, and/or weakness of one or several areas of the body; allergic reactions; (i.e.: anaphylactic reaction); and/or death. Furthermore, the patient was informed of those risks and complications associated with the medications. These include, but are not limited to: allergic reactions (i.e.: anaphylactic or anaphylactoid reaction(s)); adrenal axis suppression; blood sugar elevation that in diabetics may result in ketoacidosis or comma; water retention that in patients with history of congestive heart failure may result in shortness of breath, pulmonary edema, and decompensation with resultant heart failure; weight gain; swelling or edema; medication-induced neural toxicity; particulate matter embolism and blood vessel occlusion with resultant organ, and/or nervous system infarction; and/or aseptic necrosis of one or more joints. Finally, the patient was informed that Medicine is not an exact science; therefore, there is also the possibility of unforeseen or unpredictable risks and/or possible complications that may result in a catastrophic outcome. The patient indicated having understood very clearly. We have given the patient no guarantees and we have made no promises. Enough time was given to the patient to ask questions, all of which were answered to the patient's satisfaction. Ryan [name] has indicated that he wanted to continue with the procedure. Attestation: I, the ordering provider, attest that I have discussed with the patient the benefits, risks, side-effects, alternatives,  likelihood of achieving goals, and potential problems during recovery for the procedure that I have provided informed consent. Date  Time: 07/23/2020 10:39 AM  Pre-Procedure Preparation:  Monitoring: As per clinic protocol. Respiration, ETCO2, SpO2, BP, heart rate and rhythm monitor placed and checked for adequate function Safety Precautions: Patient was assessed for positional comfort and pressure points before starting the procedure. Time-out: I initiated and conducted the "Time-out" before starting the procedure, as per protocol. The patient was asked to participate by confirming the accuracy of the "Time Out" information. Verification of the correct person, site, and procedure were performed and confirmed by me, the nursing staff, and the patient. "Time-out" conducted as per Joint Commission's Universal Protocol (UP.01.01.01). Time: 1123  Description of Procedure:          Laterality: Bilateral. The procedure was performed in identical fashion on both sides. Levels:  L3, L4, L5,  Medial Branch Level(s) Area Prepped: Posterior Lumbosacral Region Prepping solution: ChloraPrep (2% chlorhexidine gluconate and 70% isopropyl alcohol) Safety Precautions: Aspiration looking for blood return was conducted prior to all injections. At no point did we inject any substances, as a needle was being advanced. Before injecting, the patient was told to immediately notify me if he was experiencing any new onset of "ringing in the ears, or metallic taste in the mouth". No attempts were made at seeking any paresthesias. Safe injection practices and needle disposal techniques used. Medications properly checked for expiration dates. SDV (single dose vial) medications used. After the completion of the procedure, all disposable equipment used was discarded in the proper designated medical waste containers. Local Anesthesia: Protocol guidelines were followed. The patient was positioned over the fluoroscopy table. The area  was prepped in the usual manner. The time-out was completed. The target area was identified using fluoroscopy. A 12-in long, straight, sterile hemostat was used with fluoroscopic guidance to locate the targets for each level blocked. Once located, the skin was marked with an approved surgical skin marker. Once all sites were marked, the skin (epidermis, dermis, and hypodermis), as well  as deeper tissues (fat, connective tissue and muscle) were infiltrated with a Adelson amount of a short-acting local anesthetic, loaded on a 10cc syringe with a 25G, 1.5-in  Needle. An appropriate amount of time was allowed for local anesthetics to take effect before proceeding to the next step. Local Anesthetic: Lidocaine 2.0% The unused portion of the local anesthetic was discarded in the proper designated containers. Technical explanation of process:   L3 Medial Branch Nerve Block (MBB): The target area for the L3 medial branch is at the junction of the postero-lateral aspect of the superior articular process and the superior, posterior, and medial edge of the transverse process of L4. Under fluoroscopic guidance, a Quincke needle was inserted until contact was made with os over the superior postero-lateral aspect of the pedicular shadow (target area). After negative aspiration for blood,1 cc  mL of the nerve block solution was injected without difficulty or complication. The needle was removed intact. L4 Medial Branch Nerve Block (MBB): The target area for the L4 medial branch is at the junction of the postero-lateral aspect of the superior articular process and the superior, posterior, and medial edge of the transverse process of L5. Under fluoroscopic guidance, a Quincke needle was inserted until contact was made with os over the superior postero-lateral aspect of the pedicular shadow (target area). After negative aspiration for blood,1cc mL of the nerve block solution was injected without difficulty or complication. The  needle was removed intact. L5 Medial Branch Nerve Block (MBB): The target area for the L5 medial branch is at the junction of the postero-lateral aspect of the superior articular process and the superior, posterior, and medial edge of the sacral ala. Under fluoroscopic guidance, a Quincke needle was inserted until contact was made with os over the superior postero-lateral aspect of the pedicular shadow (target area). After negative aspiration for blood,1cc  mL of the nerve block solution was injected without difficulty or complication. The needle was removed intact.   Nerve block solution: 10 cc solution made of 8 cc of 0.2% ropivacaine, 2 cc of Decadron 10 mg/cc.  1-1.5 cc injected at each level above bilaterally  Procedural Needles: 22-gauge, 3.5-inch, Quincke needles used for all levels.  Once the entire procedure was completed, the treated area was cleaned, making sure to leave some of the prepping solution back to take advantage of its long term bactericidal properties.   Illustration of the posterior view of the lumbar spine and the posterior neural structures. Laminae of L2 through S1 are labeled. DPRL5, dorsal primary ramus of L5; DPRS1, dorsal primary ramus of S1; DPR3, dorsal primary ramus of L3; FJ, facet (zygapophyseal) joint L3-L4; I, inferior articular process of L4; LB1, lateral branch of dorsal primary ramus of L1; IAB, inferior articular branches from L3 medial branch (supplies L4-L5 facet joint); IBP, intermediate branch plexus; MB3, medial branch of dorsal primary ramus of L3; NR3, third lumbar nerve root; S, superior articular process of L5; SAB, superior articular branches from L4 (supplies L4-5 facet joint also); TP3, transverse process of L3.  Vitals:   07/23/20 1135 07/23/20 1145 07/23/20 1155 07/23/20 1205  BP: 119/79 118/88 120/83 121/65  Pulse:    74  Resp: 18 16 16 16   Temp:      TempSrc:      SpO2: 98% 100% 98% 99%  Weight:      Height:         Start Time: 1123  hrs. End Time: 1135 hrs.  Imaging Guidance (Spinal):  Type of Imaging Technique: Fluoroscopy Guidance (Spinal) Indication(s): Assistance in needle guidance and placement for procedures requiring needle placement in or near specific anatomical locations not easily accessible without such assistance. Exposure Time: Please see nurses notes. Contrast: None used. Fluoroscopic Guidance: I was personally present during the use of fluoroscopy. "Tunnel Vision Technique" used to obtain the best possible view of the target area. Parallax error corrected before commencing the procedure. "Direction-depth-direction" technique used to introduce the needle under continuous pulsed fluoroscopy. Once target was reached, antero-posterior, oblique, and lateral fluoroscopic projection used confirm needle placement in all planes. Images permanently stored in EMR. Interpretation: No contrast injected. I personally interpreted the imaging intraoperatively. Adequate needle placement confirmed in multiple planes. Permanent images saved into the patient's record.   Post-operative Assessment:  Post-procedure Vital Signs:  Pulse/HCG Rate: 74 (NSR)75 (Nsr) Temp: (!) 97.1 F (36.2 C) Resp: 16 BP: 121/65 SpO2: 99 %  EBL: None  Complications: No immediate post-treatment complications observed by team, or reported by patient.  Note: The patient tolerated the entire procedure well. A repeat set of vitals were taken after the procedure and the patient was kept under observation following institutional policy, for this type of procedure. Post-procedural neurological assessment was performed, showing return to baseline, prior to discharge. The patient was provided with post-procedure discharge instructions, including a section on how to identify potential problems. Should any problems arise concerning this procedure, the patient was given instructions to immediately contact us, at any time, without hesitation. In any  case, we plan to contact the patient by telephone for a follow-up status report regarding this interventional procedure.  Comments:  No additional relevant information.  Plan of Care   Imaging Orders  DG PAIN CLINIC C-ARM 1-60 MIN NO REPORT   Medications ordered for procedure: Meds ordered this encounter  Medications   lidocaine (XYLOCAINE) 2 % (with pres) injection 400 mg   midazolam (VERSED) 5 MG/5ML injection 1-2 mg    Make sure Flumazenil is available in the pyxis when using this medication. If oversedation occurs, administer 0.2 mg IV over 15 sec. If after 45 sec no response, administer 0.2 mg again over 1 min; may repeat at 1 min intervals; not to exceed 4 doses (1 mg)   dexamethasone (DECADRON) injection 10 mg   ropivacaine (PF) 2 mg/mL (0.2%) (NAROPIN) injection 9 mL   dexamethasone (DECADRON) injection 10 mg   ropivacaine (PF) 2 mg/mL (0.2%) (NAROPIN) injection 9 mL   Medications administered: We administered lidocaine, midazolam, dexamethasone, ropivacaine (PF) 2 mg/mL (0.2%), dexamethasone, and ropivacaine (PF) 2 mg/mL (0.2%).  See the medical record for exact dosing, route, and time of administration.  Disposition: Discharge home  Discharge Date & Time: 07/23/2020; 1213 hrs.   Physician-requested Follow-up: Return in about 8 weeks (around 09/17/2020) for Post Procedure Evaluation, virtual.  Future Appointments  Date Time Provider Department Center  09/17/2020  3:20 PM Edward Jolly, MD [facility] None    Primary Care Physician: Smitty Cords, DO Location: [facility] Note by: Edward Jolly, MD Date: 07/23/2020; Time: 12:20 PM  Disclaimer:  Medicine is not an exact science. The only guarantee in medicine is that nothing is guaranteed. It is important to note that the decision to proceed with this intervention was based on the information collected from the patient. The Data and conclusions were drawn from the patient's  questionnaire, the interview, and the physical examination. Because the information was provided in large part by the patient, it cannot be guaranteed  that it has not been purposely or unconsciously manipulated. Every effort has been made to obtain as much relevant data as possible for this evaluation. It is important to note that the conclusions that lead to this procedure are derived in large part from the available data. Always take into account that the treatment will also be dependent on availability of resources and existing treatment guidelines, considered by other Pain Management Practitioners as being common knowledge and practice, at the time of the intervention. For Medico-Legal purposes, it is also important to point out that variation in procedural techniques and pharmacological choices are the acceptable norm. The indications, contraindications, technique, and results of the above procedure should only be interpreted and judged by a Board-Certified Interventional Pain Specialist with extensive familiarity and expertise in the same exact procedure and technique.

## 2020-07-24 ENCOUNTER — Telehealth: Payer: Self-pay

## 2020-07-24 DIAGNOSIS — F341 Dysthymic disorder: Secondary | ICD-10-CM | POA: Diagnosis not present

## 2020-07-24 DIAGNOSIS — F411 Generalized anxiety disorder: Secondary | ICD-10-CM | POA: Diagnosis not present

## 2020-07-24 IMAGING — MR MR LUMBAR SPINE W/O CM
5 series · 30 of 48 positions shown · non-contrast
Comparison: Radiography 11/22/2017. CT 10/19/2017. MRI 04/23/2013.

CLINICAL DATA: Previous L5-S1 diskectomy and fusion. Low back pain
radiating to the right leg with numbness and tingling over the last
2-3 months.

EXAM:
MRI LUMBAR SPINE WITHOUT CONTRAST
TECHNIQUE: Multiplanar, multisequence MR imaging of the lumbar spine was
performed. No intravenous contrast was administered.

[Series 9: T2 · sagittal · 4.0mm · 0.81mm/px · 6 of 18 slices shown (1 of 2)]
[im 1/18]
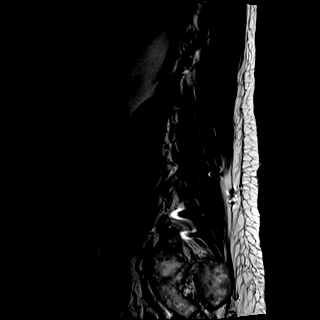
[im 4/18]
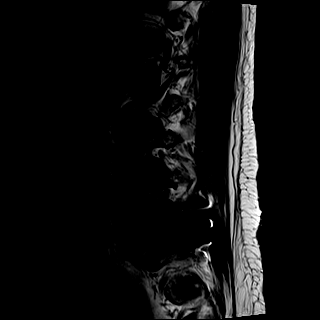
[im 7/18]
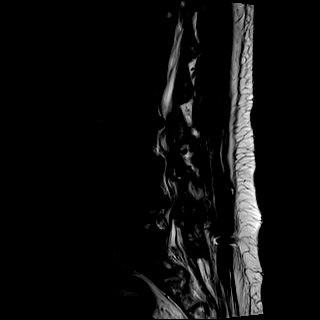
[im 11/18]
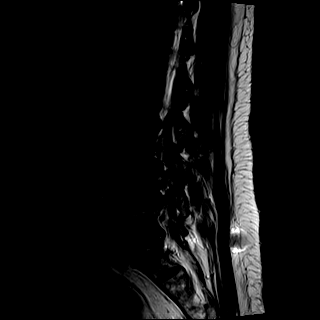
[im 14/18]
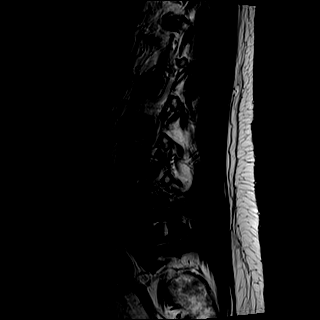
[im 18/18]
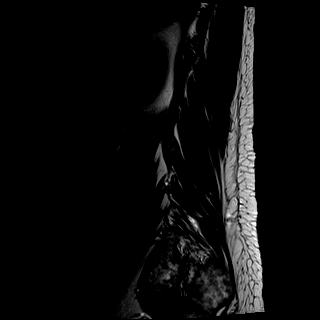

[Series 10: T1 · sagittal · 4.0mm · 0.81mm/px · 7 of 18 slices shown (1 of 2)]
[im 1/18]
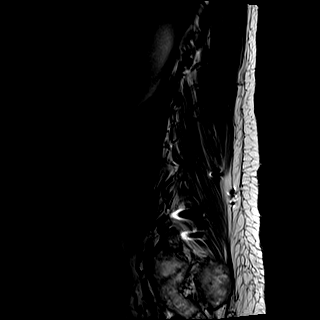
[im 3/18]
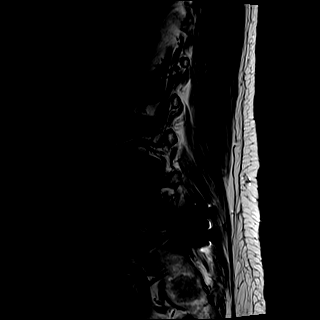
[im 6/18]
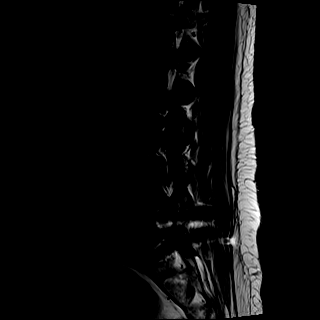
[im 9/18]
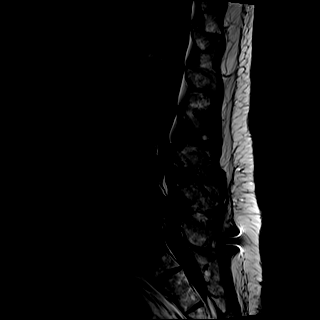
[im 12/18]
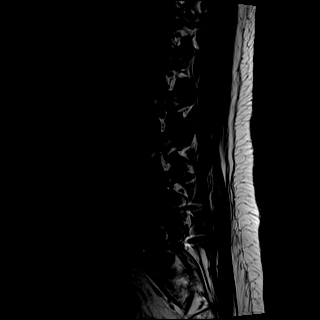
[im 15/18]
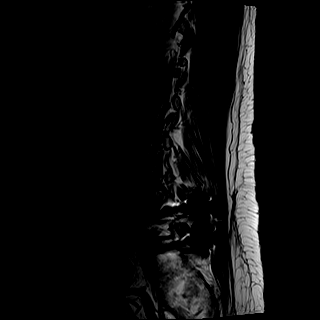
[im 18/18]
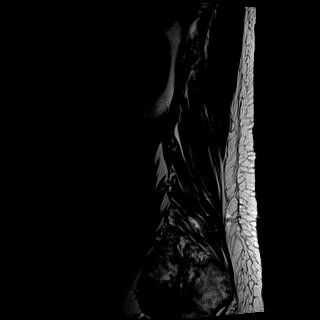

[Series 11: STIR · sagittal · 4.0mm · 0.41mm/px · 1 of 18 slices shown]
[im 1/18]
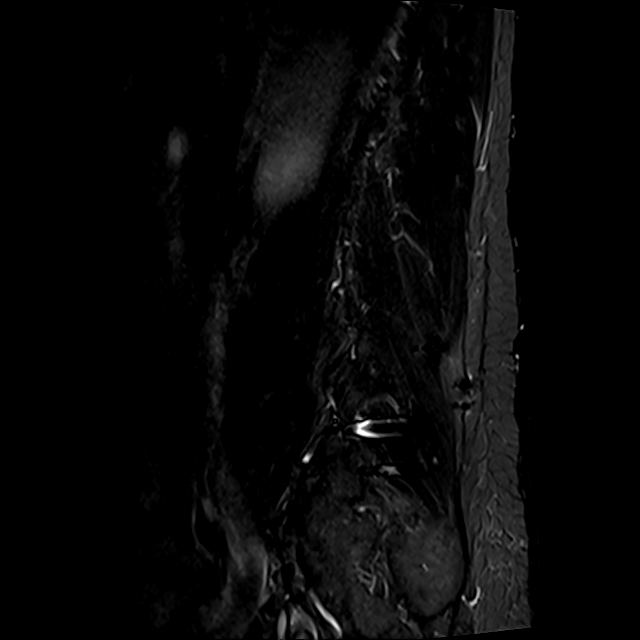

[Series 12: T2 · axial · 4.0mm · 0.78mm/px · z∈[-211,-6]mm · 8 of 36 slices shown (2 of 2)]
[im 1/36]
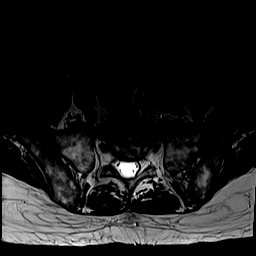
[im 6/36]
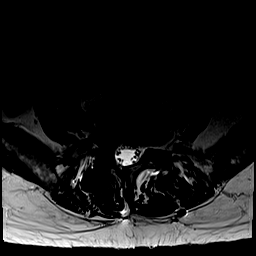
[im 11/36]
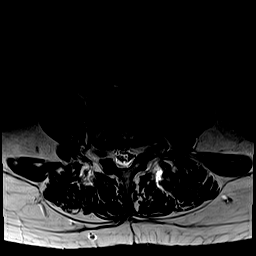
[im 17/36]
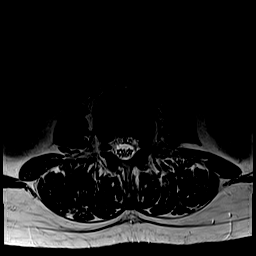
[im 19/36]
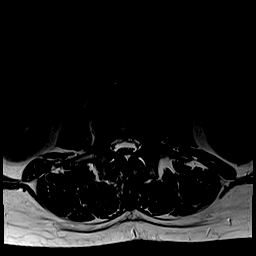
[im 25/36]
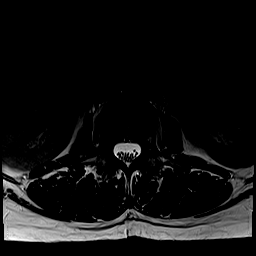
[im 30/36]
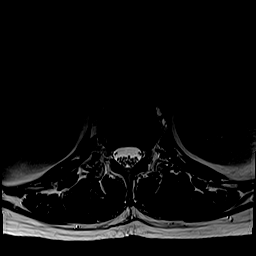
[im 36/36]
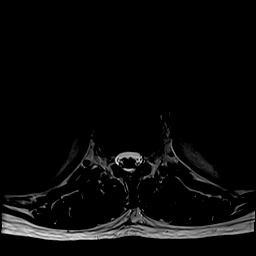

[Series 13: T1 · axial · 4.0mm · 0.39mm/px · z∈[-211,-6]mm · 8 of 36 slices shown (2 of 2)]
[im 1/36]
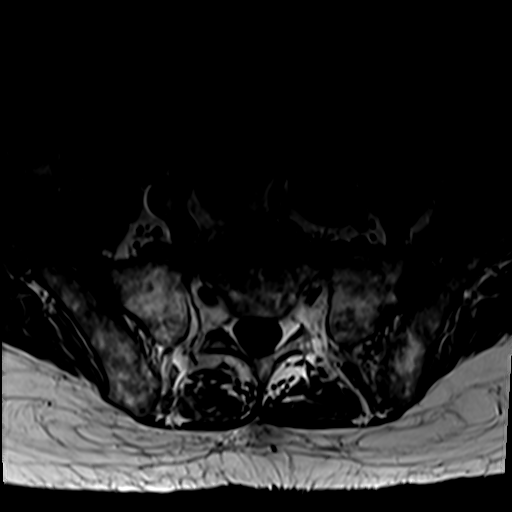
[im 6/36]
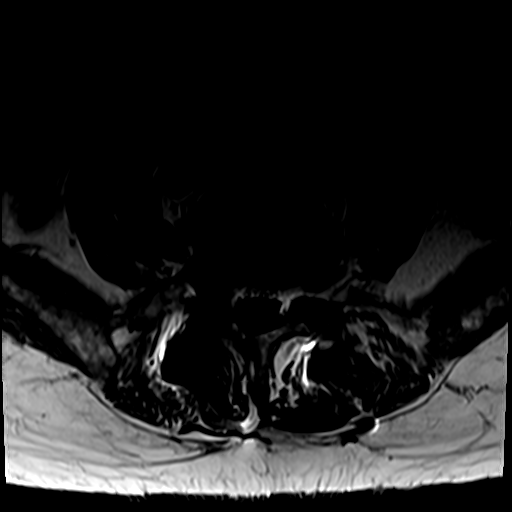
[im 11/36]
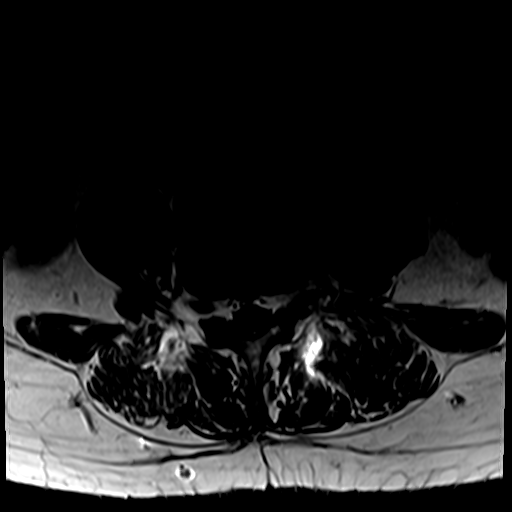
[im 17/36]
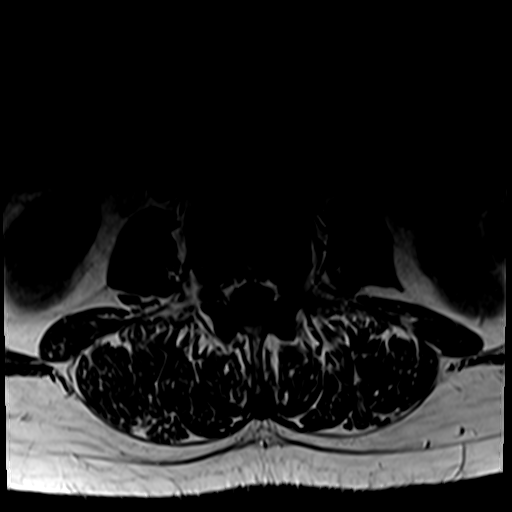
[im 19/36]
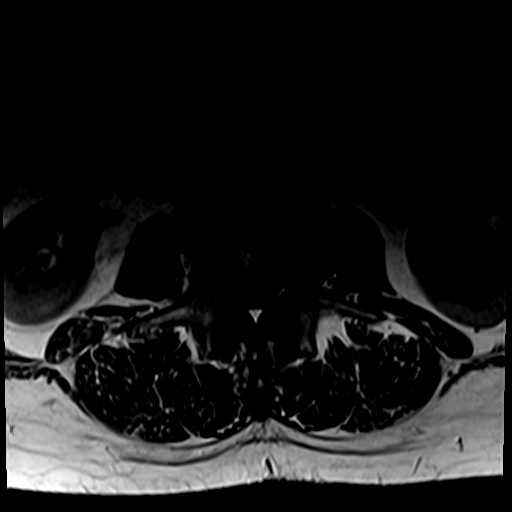
[im 25/36]
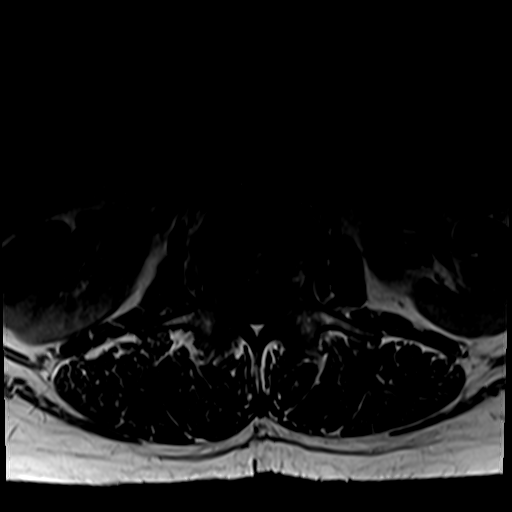
[im 30/36]
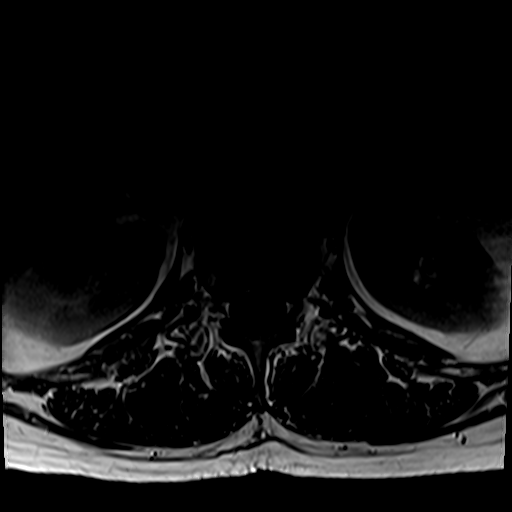
[im 36/36]
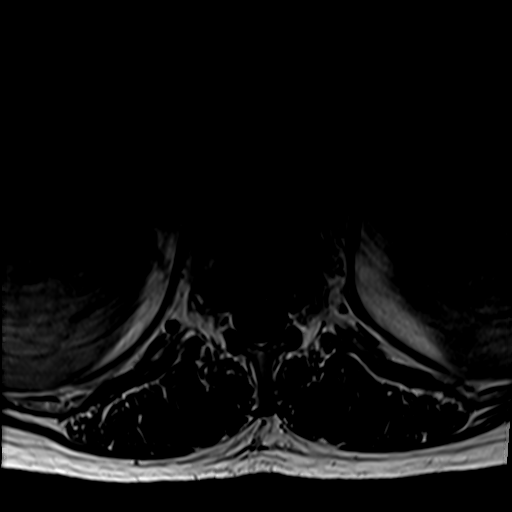

[30 of 48 positions shown; findings below may reference images not displayed]

FINDINGS: Segmentation:  5 lumbar type vertebral bodies.

Alignment:  4 mm anterolisthesis L3-4.  4 mm retrolisthesis L4-5.

Vertebrae: Chronic discogenic marrow changes in the lower lumbar
spine. No acute bone finding.

Conus medullaris and cauda equina: Conus extends to the L1 level.
Conus and cauda equina appear normal.

Paraspinal and other soft tissues: Negative

Disc levels:

No significant finding at T12-L1 or L1-2.

L2-3: Worsening of disease at this level with a circumferential
protrusion of the disc more prominent towards the right. This
indents the thecal sac. There is mild facet and ligamentous
hypertrophy. Lateral recess narrowing and foraminal narrowing right
more than left. Definite neural compression is not established, but
findings at this level could be symptomatic.

L3-4: Bilateral facet arthropathy with 4 mm of anterolisthesis.
Bulging of the disc. Stenosis of both lateral recesses, which could
cause neural compression. Slight worsening since 2850.

L4-5: 4 mm of retrolisthesis. Chronic disc degeneration with loss of
disc height. Endplate osteophytes and minor disc bulging. Mild
lateral recess narrowing without visible neural compression. Mild
bilateral foraminal narrowing without visible compression of the
exiting L4 nerves.

L5-S1: Interval posterior decompression, diskectomy and fusion
procedure. No complicating feature. Wide patency of the canal.
Chronic foraminal narrowing on the right that would have some
potential to affect the exiting L5 nerve.
IMPRESSION: Newly seen circumferential disc protrusion at L2-3 more prominent
towards the right. Mild facet and ligamentous hypertrophy. Narrowing
of the lateral recesses and foramina right more than left. Definite
neural compression is not established, but there is some potential
that right-sided nerve compression or irritation could occur at this
level.

Slight worsening of bilateral lateral recess stenosis at L3-4 that
could possibly be symptomatic on either side.

Similar appearance of bilateral lateral recess and foraminal
narrowing at L4-5 but without likely neural compression.

Previous decompression and fusion at L5-S1. Good patency of the
central canal. Some foraminal narrowing on the right which could
affect the right L5 nerve..

## 2020-07-24 NOTE — Telephone Encounter (Signed)
Post procedure phone call.  LM 

## 2020-07-31 ENCOUNTER — Emergency Department
Admission: EM | Admit: 2020-07-31 | Discharge: 2020-07-31 | Disposition: A | Discharge status: Home / Self Care | Payer: Medicaid Other | Attending: Emergency Medicine | Admitting: Emergency Medicine

## 2020-07-31 ENCOUNTER — Other Ambulatory Visit: Payer: Self-pay

## 2020-07-31 ENCOUNTER — Encounter: Payer: Self-pay | Admitting: Emergency Medicine

## 2020-07-31 DIAGNOSIS — R030 Elevated blood-pressure reading, without diagnosis of hypertension: Secondary | ICD-10-CM | POA: Diagnosis present

## 2020-07-31 DIAGNOSIS — J449 Chronic obstructive pulmonary disease, unspecified: Secondary | ICD-10-CM | POA: Insufficient documentation

## 2020-07-31 DIAGNOSIS — R42 Dizziness and giddiness: Secondary | ICD-10-CM | POA: Insufficient documentation

## 2020-07-31 DIAGNOSIS — R519 Headache, unspecified: Secondary | ICD-10-CM | POA: Insufficient documentation

## 2020-07-31 DIAGNOSIS — F17210 Nicotine dependence, cigarettes, uncomplicated: Secondary | ICD-10-CM | POA: Insufficient documentation

## 2020-07-31 DIAGNOSIS — I10 Essential (primary) hypertension: Secondary | ICD-10-CM

## 2020-07-31 DIAGNOSIS — I129 Hypertensive chronic kidney disease with stage 1 through stage 4 chronic kidney disease, or unspecified chronic kidney disease: Secondary | ICD-10-CM | POA: Diagnosis not present

## 2020-07-31 DIAGNOSIS — Z79899 Other long term (current) drug therapy: Secondary | ICD-10-CM | POA: Insufficient documentation

## 2020-07-31 DIAGNOSIS — Z7982 Long term (current) use of aspirin: Secondary | ICD-10-CM | POA: Insufficient documentation

## 2020-07-31 DIAGNOSIS — J45909 Unspecified asthma, uncomplicated: Secondary | ICD-10-CM | POA: Insufficient documentation

## 2020-07-31 DIAGNOSIS — Z7951 Long term (current) use of inhaled steroids: Secondary | ICD-10-CM | POA: Insufficient documentation

## 2020-07-31 DIAGNOSIS — N189 Chronic kidney disease, unspecified: Secondary | ICD-10-CM | POA: Diagnosis not present

## 2020-07-31 LAB — BASIC METABOLIC PANEL
Anion gap: 8 (ref 5–15)
BUN: 11 mg/dL (ref 8–23)
CO2: 26 mmol/L (ref 22–32)
Calcium: 8.9 mg/dL (ref 8.9–10.3)
Chloride: 107 mmol/L (ref 98–111)
Creatinine, Ser: 0.88 mg/dL (ref 0.61–1.24)
GFR, Estimated: >60 mL/min (ref >60)
Glucose, Bld: 97 mg/dL (ref 70–99)
Potassium: 3.8 mmol/L (ref 3.5–5.1)
Sodium: 141 mmol/L (ref 135–145)

## 2020-07-31 LAB — CBC
HCT: 44.2 % (ref 39.0–52.0)
Hemoglobin: 14.8 g/dL (ref 13.0–17.0)
MCH: 31.7 pg (ref 26.0–34.0)
MCHC: 33.5 g/dL (ref 30.0–36.0)
MCV: 94.6 fL (ref 80.0–100.0)
Platelets: 201 10*3/uL (ref 150–400)
RBC: 4.67 MIL/uL (ref 4.22–5.81)
RDW: 14.4 % (ref 11.5–15.5)
WBC: 8.5 10*3/uL (ref 4.0–10.5)
nRBC: 0 % (ref 0.0–0.2)

## 2020-07-31 LAB — URINALYSIS, COMPLETE (UACMP) WITH MICROSCOPIC
Bacteria, UA: NONE SEEN
Bilirubin Urine: NEGATIVE
Glucose, UA: NEGATIVE mg/dL
Hgb urine dipstick: NEGATIVE
Ketones, ur: NEGATIVE mg/dL
Leukocytes,Ua: NEGATIVE
Nitrite: NEGATIVE
Protein, ur: NEGATIVE mg/dL
Specific Gravity, Urine: 1.016 (ref 1.005–1.030)
pH: 7 (ref 5.0–8.0)

## 2020-07-31 MED ORDER — LISINOPRIL 40 MG PO TABS: 40.0000 mg | ORAL_TABLET | Freq: Every day | ORAL | 1 refills | Status: DC

## 2020-07-31 MED ORDER — LISINOPRIL 10 MG PO TABS
10.0000 mg | ORAL_TABLET | Freq: Once | ORAL | Status: AC
  Administered 2020-07-31: 10 mg via ORAL
  Filled 2020-07-31: qty 1

## 2020-07-31 NOTE — Discharge Instructions (Addendum)
I want to increase your lisinopril to 40 mg a day.  You can take your 20 mg pills 2 a day or you can get the prescription filled I will write you for 40 mg once a day.  I want you to follow-up with your primary care doctor tomorrow or next week at the latest.  Please return here at once if you have any further symptoms of headache lightheadedness etc.  Be sure to continue to record your blood pressure twice a day and take it to your doctor.

## 2020-07-31 NOTE — ED Provider Notes (Signed)
[facility] Emergency Department Provider Note   ____________________________________________   Event Date/Time   First MD Initiated Contact with Patient 07/31/20 1731     (approximate)  I have reviewed the triage vital signs and the nursing notes.   HISTORY  Chief Complaint Hypertension    HPI Ryan [name] is a 65 y.o. male who reports he woke up this morning and took his lisinopril about 4 hours later than usually.  His blood pressure was high he took it at 239/151.  He was dizzy and had a headache at the time.  He now feels well his blood pressure had gone down to the 140s and is now back up to 167 systolic.  Patient reports he has not been eating a lot of salt and he did have some alcohol a few nights ago but not last night.  He reports he only drinks 2 or 3 times a year.  He has had been having a lot of stress lately.         Past Medical History:  Diagnosis Date   Anxiety    Arthritis    left hand   Asthma    Chronic kidney disease    HAS HAD KIDNEY STONE 2015-- JUST PASSED   COPD (chronic obstructive pulmonary disease) (HCC)    Dyspnea    GERD (gastroesophageal reflux disease)    TAKES TUMS FOR RELIEF   Hyperlipidemia    Hypertension    Improved, No longer on meds   Lower urinary tract symptoms (LUTS)    Prostate disease    Stroke [facility])    Thyroid disease    Wears dentures    hass full upper plate, not wearing, broken   Wears hearing aid     Patient Active Problem List   Diagnosis Date Noted   Loose stools 03/04/2020   Overweight (BMI 25.0-29.9) 10/23/2019   History of CVA (cerebrovascular accident) without residual deficits 10/23/2019   Pre-diabetes 10/23/2019   Mixed hyperlipidemia 10/23/2019   Chronic pain syndrome 08/15/2019   Lumbar spondylosis 03/07/2018   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   History of lumbar fusion 03/07/2018   DDD (degenerative disc disease), lumbosacral 06/02/2014   Spinal stenosis, lumbar  region, with neurogenic claudication 06/02/2014   Lumbar radiculopathy 06/02/2014   Centrilobular emphysema (HCC) 04/19/2014   Chronic LBP 04/19/2014   Essential hypertension 04/19/2014   Compulsive tobacco user syndrome 05/22/2013   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    Past Surgical History:  Procedure Laterality Date   BACK SURGERY     CATARACT EXTRACTION W/PHACO Left 01/06/2016   Procedure: CATARACT EXTRACTION PHACO AND INTRAOCULAR LENS PLACEMENT (IOC);  Surgeon: Nevada Crane, MD;  Location: [facility];  Service: Ophthalmology;  Laterality: Left;  LEFT   COLONOSCOPY WITH PROPOFOL N/A 04/19/2016   Procedure: COLONOSCOPY WITH PROPOFOL;  Surgeon: Scot Jun, MD;  Location: [facility] ENDOSCOPY;  Service: Endoscopy;  Laterality: N/A;   ESOPHAGOGASTRODUODENOSCOPY (EGD) WITH PROPOFOL N/A 10/24/2015   Procedure: ESOPHAGOGASTRODUODENOSCOPY (EGD) WITH PROPOFOL;  Surgeon: Scot Jun, MD;  Location: [facility] ENDOSCOPY;  Service: Endoscopy;  Laterality: N/A;   EYE SURGERY     HERNIA REPAIR     ERRONEOUS UMBILICAL HERNIA   2011   MYRINGOTOMY WITH TUBE PLACEMENT Bilateral 03/28/2015   Procedure: MYRINGOTOMY WITH TUBE PLACEMENT;  Surgeon: Linus Salmons, MD;  Location: [facility]ERY [facility];  Service: ENT;  Laterality: Bilateral;  BUTTERFLY TUBE   SINUSOTOMY  02/28/2019   TONSILLECTOMY  TRANSFORAMINAL LUMBAR INTERBODY FUSION (TLIF) WITH PEDICLE SCREW FIXATION 1 LEVEL Right 06/18/2014   Procedure: TRANSFORAMINAL LUMBAR INTERBODY FUSION (TLIF) WITH PEDICLE SCREW FIXATION 1 LEVEL LUMBAR 5 -SACRAL 1;  Surgeon: Aliene Beamsandy Kritzer, MD;  Location: [facility] NEURO ORS;  Service: Neurosurgery;  Laterality: Right;  Right transforaminal lumbar interbody fusion with interbody prosthesis and percutaneous pedicle screws Lumbar 5 to Sacral 1    Prior to Admission medications   Medication Sig Start Date End Date Taking? Authorizing Provider  lisinopril (ZESTRIL) 40 MG tablet Take 1 tablet  (40 mg total) by mouth daily. 07/31/20  Yes Arnaldo NatalMalinda, Concetta Guion F, MD  acetaminophen (TYLENOL) 325 MG tablet Take 650 mg by mouth every 6 (six) hours as needed for mild pain or headache.     [provider]  ALPRAZolam Prudy Feeler(XANAX) 1 MG tablet Take 1 mg by mouth 2 (two) times daily as needed for anxiety.     [provider]  aspirin EC 81 MG EC tablet Take 1 tablet (81 mg total) by mouth daily. Swallow whole. 09/13/19   Shahmehdi, Gemma PayorSeyed A, MD  atorvastatin (LIPITOR) 80 MG tablet TAKE 1 TABLET BY MOUTH AT BEDTIME 06/13/20   Karamalegos, Netta NeatAlexander J, DO  cetirizine (ZYRTEC) 10 MG tablet Take 10 mg by mouth daily.    [provider]  desvenlafaxine (PRISTIQ) 50 MG 24 hr tablet Take 50 mg by mouth daily. 07/18/20   [provider]  gabapentin (NEURONTIN) 400 MG capsule Take 400 mg by mouth in the morning and at bedtime. 400 mg twice a day as per patient --psychiatrist changed that Normally takes 400 mg daily 09/29/19   [provider]  lisinopril (ZESTRIL) 10 MG tablet Take by mouth. Patient not taking: Reported on 07/23/2020 06/04/19   [provider]  lisinopril (ZESTRIL) 20 MG tablet Take 1 tablet by mouth once daily 05/15/20   Karamalegos, Netta NeatAlexander J, DO  nicotine (NICODERM CQ - DOSED IN MG/24 HOURS) 21 mg/24hr patch Place 1 patch (21 mg total) onto the skin daily. 09/13/19   ShahmehdiGemma Payor, Seyed A, MD  PROAIR HFA 108 [phone](90 Base) MCG/ACT inhaler INHALE 2 PUFFS BY MOUTH EVERY 6 HOURS AS NEEDED FOR WHEEZING OR SHORTNESS OF BREATH 06/26/20   Karamalegos, Netta NeatAlexander J, DO  sildenafil (REVATIO) 20 MG tablet Take 20-100 mg by mouth as needed (ED).     [provider]  tamsulosin (FLOMAX) 0.4 MG CAPS capsule Take 1 capsule (0.4 mg total) by mouth in the morning and at bedtime. 10/25/19   Smitty CordsKaramalegos, Alexander J, DO    Allergies Patient has no known allergies.  Family History  Problem Relation Age of Onset   Arthritis Mother    Hypertension Mother    Diabetes Father     Hypertension Father     Social History Social History   Tobacco Use   Smoking status: Every Day    Packs/day: 1.50    Years: 40.00    Pack years: 60.00    Types: Cigarettes   Smokeless tobacco: Current   Tobacco comments:    has cut back to 1PPD  Substance Use Topics   Alcohol use: Yes    Alcohol/week: 0.0 standard drinks    Comment: ONCE OR TWICE A YR   Drug use: No    Review of Systems Currently Constitutional: No fever/chills Eyes: No visual changes. ENT: No sore throat. Cardiovascular: Denies chest pain. Respiratory: Denies shortness of breath. Gastrointestinal: No abdominal pain.  No nausea, no vomiting.  No diarrhea.  No constipation. Genitourinary:  Negative for dysuria. Musculoskeletal: Negative for back pain. Skin: Negative for rash. Neurological: Negative for headaches, focal weakness   ____________________________________________   PHYSICAL EXAM:  VITAL SIGNS: ED Triage Vitals  Enc Vitals Group     BP 07/31/20 1533 (!) 151/97     Pulse Rate 07/31/20 1533 61     Resp 07/31/20 1533 16     Temp 07/31/20 1533 98.4 F (36.9 C)     Temp Source 07/31/20 1533 Oral     SpO2 07/31/20 1533 98 %     Weight 07/31/20 1534 170 lb (77.1 kg)     Height 07/31/20 1534 5\' 7"  (1.702 m)     Head Circumference --      Peak Flow --      Pain Score 07/31/20 1533 5     Pain Loc --      Pain Edu? --      Excl. in GC? --     Constitutional: Alert and oriented. Well appearing and in no acute distress. Eyes: Conjunctivae are normal. P approximately equal the left one is teardrop shaped due to an old injury 20 years ago RL. EOMI. Head: Atraumatic. Nose: No congestion/rhinnorhea. Mouth/Throat: Mucous membranes are moist.  Oropharynx non-erythematous. Neck: No stridor.   Cardiovascular: Normal rate, regular rhythm. Grossly normal heart sounds.  Good peripheral circulation. Respiratory: Normal respiratory effort.  No retractions. Lungs CTAB. Gastrointestinal: Soft and  nontender. No distention. No abdominal bruits.  Musculoskeletal: No lower extremity tenderness nor edema.   Neurologic:  Normal speech and language. No gross focal neurologic deficits are appreciated. No gait instability. Skin:  Skin is warm, dry and intact. No rash noted.   ____________________________________________   LABS (all labs ordered are listed, but only abnormal results are displayed)  Labs Reviewed  URINALYSIS, COMPLETE (UACMP) WITH MICROSCOPIC - Abnormal; Notable for the following components:      Result Value   Color, Urine YELLOW (*)    APPearance CLEAR (*)    All other components within normal limits  BASIC METABOLIC PANEL  CBC  CBG MONITORING, ED   ____________________________________________  EKG   ____________________________________________  RADIOLOGY 10/01/20, personally viewed and evaluated these images (plain radiographs) as part of my medical decision making, as well as reviewing the written report by the radiologist.  ED MD interpretation: EKG read interpreted by me shows normal sinus rhythm rate of 61 left axis the computer is reading left anterior hemiblock which appears to be correct there is no obvious acute ST-T changes.  Official radiology report(s): No results found.  ____________________________________________   PROCEDURES  Procedure(s) performed (including Critical Care):  Procedures   ____________________________________________   INITIAL IMPRESSION / ASSESSMENT AND PLAN / ED COURSE  As part of my medical decision making, I reviewed the records available on chart review from the hospital.  I also reviewed the EKG.  Patient is now back to baseline although his blood pressure is creeping up again.  It was quite high at home and he was symptomatic.  In view of the height of his blood pressure and the symptoms he had at home and the fact his blood pressures going back up again I believe I will increase his lisinopril.  He  is already taking his blood pressure at least twice a day.  I will have him record these readings and follow-up with his primary care doctor as soon as possible tomorrow or next week at the latest.  He will return here for further  problems.              ____________________________________________   FINAL CLINICAL IMPRESSION(S) / ED DIAGNOSES  Final diagnoses:  Hypertension, unspecified type     ED Discharge Orders          Ordered    lisinopril (ZESTRIL) 40 MG tablet  Daily        07/31/20 1919             Note:  This document was prepared using Dragon voice recognition software and may include unintentional dictation errors.    Arnaldo Natal, MD 07/31/20 Jerene Bears

## 2020-07-31 NOTE — ED Notes (Signed)
Pt to ED stating he slept late today and was "a few hours late" taking Lisinopril for HTN. His BP was high on home BP machine but he doesn't remember number. He states BP has come down since arrived to ER. Current BP is 186/93. Pt denies CP, SOB, dizziness. Endorses slight HA which is a normal daily occurrence for him.

## 2020-07-31 NOTE — ED Triage Notes (Signed)
Pt reports that he took his B/P before coming and it was 239/151 he took his Lisinopril before coming. He was dizzy and had a headache. B/P is now 151/97

## 2020-08-02 ENCOUNTER — Emergency Department
Admission: EM | Admit: 2020-08-02 | Discharge: 2020-08-02 | Disposition: A | Discharge status: Home / Self Care | Payer: Medicaid Other | Attending: Emergency Medicine | Admitting: Emergency Medicine

## 2020-08-02 ENCOUNTER — Encounter: Payer: Self-pay | Admitting: Emergency Medicine

## 2020-08-02 ENCOUNTER — Other Ambulatory Visit: Payer: Self-pay

## 2020-08-02 DIAGNOSIS — Y902 Blood alcohol level of 40-59 mg/100 ml: Secondary | ICD-10-CM | POA: Insufficient documentation

## 2020-08-02 DIAGNOSIS — F99 Mental disorder, not otherwise specified: Secondary | ICD-10-CM | POA: Diagnosis not present

## 2020-08-02 DIAGNOSIS — Z046 Encounter for general psychiatric examination, requested by authority: Secondary | ICD-10-CM | POA: Diagnosis not present

## 2020-08-02 DIAGNOSIS — R4182 Altered mental status, unspecified: Secondary | ICD-10-CM | POA: Insufficient documentation

## 2020-08-02 DIAGNOSIS — Z79899 Other long term (current) drug therapy: Secondary | ICD-10-CM | POA: Diagnosis not present

## 2020-08-02 DIAGNOSIS — J449 Chronic obstructive pulmonary disease, unspecified: Secondary | ICD-10-CM | POA: Diagnosis not present

## 2020-08-02 DIAGNOSIS — F1024 Alcohol dependence with alcohol-induced mood disorder: Secondary | ICD-10-CM | POA: Diagnosis not present

## 2020-08-02 DIAGNOSIS — I129 Hypertensive chronic kidney disease with stage 1 through stage 4 chronic kidney disease, or unspecified chronic kidney disease: Secondary | ICD-10-CM | POA: Insufficient documentation

## 2020-08-02 DIAGNOSIS — F1094 Alcohol use, unspecified with alcohol-induced mood disorder: Secondary | ICD-10-CM | POA: Diagnosis present

## 2020-08-02 DIAGNOSIS — Z20822 Contact with and (suspected) exposure to covid-19: Secondary | ICD-10-CM | POA: Insufficient documentation

## 2020-08-02 DIAGNOSIS — F17210 Nicotine dependence, cigarettes, uncomplicated: Secondary | ICD-10-CM | POA: Insufficient documentation

## 2020-08-02 DIAGNOSIS — N189 Chronic kidney disease, unspecified: Secondary | ICD-10-CM | POA: Diagnosis not present

## 2020-08-02 DIAGNOSIS — J45909 Unspecified asthma, uncomplicated: Secondary | ICD-10-CM | POA: Diagnosis not present

## 2020-08-02 DIAGNOSIS — Z7982 Long term (current) use of aspirin: Secondary | ICD-10-CM | POA: Insufficient documentation

## 2020-08-02 DIAGNOSIS — R45850 Homicidal ideations: Secondary | ICD-10-CM | POA: Diagnosis not present

## 2020-08-02 LAB — BASIC METABOLIC PANEL
Anion gap: 11 (ref 5–15)
BUN: 14 mg/dL (ref 8–23)
CO2: 24 mmol/L (ref 22–32)
Calcium: 8.9 mg/dL (ref 8.9–10.3)
Chloride: 106 mmol/L (ref 98–111)
Creatinine, Ser: 1.04 mg/dL (ref 0.61–1.24)
GFR, Estimated: >60 mL/min (ref >60)
Glucose, Bld: 129 mg/dL — ABNORMAL HIGH (ref 70–99)
Potassium: 3.2 mmol/L — ABNORMAL LOW (ref 3.5–5.1)
Sodium: 141 mmol/L (ref 135–145)

## 2020-08-02 LAB — RESP PANEL BY RT-PCR (FLU A&B, COVID) ARPGX2
Influenza A by PCR: NEGATIVE
Influenza B by PCR: NEGATIVE
SARS Coronavirus 2 by RT PCR: NEGATIVE

## 2020-08-02 LAB — CBC
HCT: 45.2 % (ref 39.0–52.0)
Hemoglobin: 15.2 g/dL (ref 13.0–17.0)
MCH: 32.3 pg (ref 26.0–34.0)
MCHC: 33.6 g/dL (ref 30.0–36.0)
MCV: 96.2 fL (ref 80.0–100.0)
Platelets: 239 10*3/uL (ref 150–400)
RBC: 4.7 MIL/uL (ref 4.22–5.81)
RDW: 14.2 % (ref 11.5–15.5)
WBC: 16.1 10*3/uL — ABNORMAL HIGH (ref 4.0–10.5)
nRBC: 0 % (ref 0.0–0.2)

## 2020-08-02 LAB — ETHANOL: Alcohol, Ethyl (B): 57 mg/dL — ABNORMAL HIGH (ref <10)

## 2020-08-02 NOTE — ED Notes (Signed)
Pt given meal tray.

## 2020-08-02 NOTE — ED Notes (Signed)
Pt discharged home.  VS stable.  All belongings returned to patient.   

## 2020-08-02 NOTE — ED Notes (Signed)
Pt has called for a ride and is dressing for discharge.
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
## 2020-08-02 NOTE — Consult Note (Signed)
[facility]  08/02/2020 11:10 AM Ryan [name]  MRN:  [id number]  Method of visit?: Face to Face   Principal Problem: <principal problem not specified> Discharge Diagnoses: Active Problems:   Alcohol-induced mood disorder (HCC)   Subjective: Patient presents to the Emergency Department following alcohol intoxication during which he made threats to wife; has no history of harm or aggressions towards others. Patient without recollection of prior threats at this time; did have alcohol on board following evening. Patient is currently alert and oriented, without slurred voice following commands; no evidence of withdrawal and presents calm and cooperative. Patient states having chronic back pain due to prior surgery in which patient follows-up with outpatient pain clinic. Patient also states seeing a therapist outpatient. Patient intermittently teary-eyed when speaking about chronic pain, but self-soothes appropriately. Patient denies any suicidal or homicidal ideation; denies hallucinations or delusions. Patient states wanting to proceed home and continue with his outpatient follow-up for therapeutic management. Patient without current risk factors or need for inpatient therapy, psychiatrically stable for discharge.  Total Time spent with patient: one hour  Past Psychiatric History: Anxiety, alcohol abuse   Past Medical History:  Past Medical History:  Diagnosis Date   Anxiety    Arthritis    left hand   Asthma    Chronic kidney disease    HAS HAD KIDNEY STONE 2015-- JUST PASSED   COPD (chronic obstructive pulmonary disease) (HCC)    Dyspnea    GERD (gastroesophageal reflux disease)    TAKES TUMS FOR RELIEF   Hyperlipidemia    Hypertension    Improved, No longer on meds   Lower urinary tract symptoms (LUTS)    Prostate disease    Stroke [facility](HCC)    Thyroid disease    Wears dentures    hass full upper plate, not wearing, broken   Wears hearing aid     Past Surgical History:   Procedure Laterality Date   BACK SURGERY     CATARACT EXTRACTION W/PHACO Left 01/06/2016   Procedure: CATARACT EXTRACTION PHACO AND INTRAOCULAR LENS PLACEMENT (IOC);  Surgeon: Nevada CraneBradley Mark King, MD;  [facility];  Service: Ophthalmology;  Laterality: Left;  LEFT   COLONOSCOPY WITH PROPOFOL N/A 04/19/2016   Procedure: COLONOSCOPY WITH PROPOFOL;  Surgeon: Scot Junobert T Elliott, MD;  [facility];  Service: Endoscopy;  Laterality: N/A;   ESOPHAGOGASTRODUODENOSCOPY (EGD) WITH PROPOFOL N/A 10/24/2015   Procedure: ESOPHAGOGASTRODUODENOSCOPY (EGD) WITH PROPOFOL;  Surgeon: Scot Junobert T Elliott, MD;  [facility];  Service: Endoscopy;  Laterality: N/A;   EYE SURGERY     HERNIA REPAIR     ERRONEOUS UMBILICAL HERNIA   2011   MYRINGOTOMY WITH TUBE PLACEMENT Bilateral 03/28/2015   Procedure: MYRINGOTOMY WITH TUBE PLACEMENT;  Surgeon: Linus Salmonshapman McQueen, MD;  [facility];  Service: ENT;  Laterality: Bilateral;  BUTTERFLY TUBE   SINUSOTOMY  02/28/2019   TONSILLECTOMY     TRANSFORAMINAL LUMBAR INTERBODY FUSION (TLIF) WITH PEDICLE SCREW FIXATION 1 LEVEL Right 06/18/2014   Procedure: TRANSFORAMINAL LUMBAR INTERBODY FUSION (TLIF) WITH PEDICLE SCREW FIXATION 1 LEVEL LUMBAR 5 -SACRAL 1;  Surgeon: Aliene Beamsandy Kritzer, MD;  [facility]: [facility];  Service: Neurosurgery;  Laterality: Right;  Right transforaminal lumbar interbody fusion with interbody prosthesis and percutaneous pedicle screws Lumbar 5 to Sacral 1   Family History:  Family History  Problem Relation Age of Onset   Arthritis Mother    Hypertension Mother    Diabetes Father    Hypertension Father  Family Psychiatric  History: No known psychiatric family history. Social History:  Social History   Substance and Sexual Activity  Alcohol Use Yes   Alcohol/week: 0.0 standard drinks   Comment: ONCE OR TWICE A YR     Social History   Substance and Sexual Activity  Drug Use No    Social History    Socioeconomic History   Marital status: Married    Spouse name: Not on file   Number of children: Not on file   Years of education: Not on file   Highest education level: Not on file  Occupational History   Not on file  Tobacco Use   Smoking status: Every Day    Packs/day: 1.50    Years: 40.00    Pack years: 60.00    Types: Cigarettes   Smokeless tobacco: Current   Tobacco comments:    has cut back to 1PPD  Substance and Sexual Activity   Alcohol use: Yes    Alcohol/week: 0.0 standard drinks    Comment: ONCE OR TWICE A YR   Drug use: No   Sexual activity: Not on file  Other Topics Concern   Not on file  Social History Narrative   Not on file   Social Determinants of Health   Financial Resource Strain: Not on file  Food Insecurity: Not on file  Transportation Needs: Not on file  Physical Activity: Not on file  Stress: Not on file  Social Connections: Not on file    Tobacco Cessation:  N/A, patient does not currently use tobacco products  Current Medications: No current facility-administered medications for this encounter.   Current Outpatient Medications  Medication Sig Dispense Refill   acetaminophen (TYLENOL) 325 MG tablet Take 650 mg by mouth every 6 (six) hours as needed for mild pain or headache.      ALPRAZolam (XANAX) 1 MG tablet Take 1 mg by mouth 2 (two) times daily as needed for anxiety.      aspirin EC 81 MG EC tablet Take 1 tablet (81 mg total) by mouth daily. Swallow whole. 30 tablet 11   atorvastatin (LIPITOR) 80 MG tablet TAKE 1 TABLET BY MOUTH AT BEDTIME 90 tablet 0   cetirizine (ZYRTEC) 10 MG tablet Take 10 mg by mouth daily.     desvenlafaxine (PRISTIQ) 50 MG 24 hr tablet Take 50 mg by mouth daily.     gabapentin (NEURONTIN) 400 MG capsule Take 400 mg by mouth in the morning and at bedtime. 400 mg twice a day as per patient --psychiatrist changed that Normally takes 400 mg daily     lisinopril (ZESTRIL) 10 MG tablet Take by mouth. (Patient not  taking: Reported on 07/23/2020)     lisinopril (ZESTRIL) 20 MG tablet Take 1 tablet by mouth once daily 90 tablet 0   lisinopril (ZESTRIL) 40 MG tablet Take 1 tablet (40 mg total) by mouth daily. 30 tablet 1   nicotine (NICODERM CQ - DOSED IN MG/24 HOURS) 21 mg/24hr patch Place 1 patch (21 mg total) onto the skin daily. 28 patch 2   PROAIR HFA 108 (90 Base) MCG/ACT inhaler INHALE 2 PUFFS BY MOUTH EVERY 6 HOURS AS NEEDED FOR WHEEZING OR SHORTNESS OF BREATH 9 g 0   sildenafil (REVATIO) 20 MG tablet Take 20-100 mg by mouth as needed (ED).      tamsulosin (FLOMAX) 0.4 MG CAPS capsule Take 1 capsule (0.4 mg total) by mouth in the morning and at bedtime. 180 capsule 1   PTA  Medications: (Not in a hospital admission)   Musculoskeletal: Strength & Muscle Tone: within normal limits Gait & Station: normal Patient leans: N/A  Psychiatric Specialty Exam: Physical Exam Vitals and nursing note reviewed.  Constitutional:      Appearance: Normal appearance.  HENT:     Head: Normocephalic.     Nose: Nose normal.  Pulmonary:     Effort: Pulmonary effort is normal.  Musculoskeletal:        General: Normal range of motion.     Cervical back: Normal range of motion.  Neurological:     General: No focal deficit present.     Mental Status: He is alert and oriented to person, place, and time.  Psychiatric:        Attention and Perception: Attention and perception normal.        Mood and Affect: Mood is depressed.        Speech: Speech normal.        Behavior: Behavior normal. Behavior is cooperative.        Thought Content: Thought content normal.        Cognition and Memory: Cognition and memory normal.        Judgment: Judgment normal.    Review of Systems  Psychiatric/Behavioral:  Positive for depression and substance abuse.   All other systems reviewed and are negative.  Blood pressure (!) 92/59, pulse 70, temperature 97.6 F (36.4 C), temperature source Oral, resp. rate 18, SpO2 93 %.There  is no height or weight on file to calculate BMI.  General Appearance:  Appropriate  Eye Contact:  Good  Speech:  Normal Rate  Volume:  Normal  Mood:  Appropriate, euthymic  Affect: Appropriate  Thought Process:  Coherent  Orientation:  Full (Time, Place, and Person)  Thought Content:  WDL  Suicidal Thoughts:  No  Homicidal Thoughts:  No  Memory:  Immediate: Good Recent: Good Long-term: Good  Judgement:  Good  Insight:  Fair  Psychomotor Activity:  Normal  Concentration:  Concentration: Good and Attention Span: Good  Recall:  Good  Fund of Knowledge:  Good  Language:  Good  Akathisia:  No  Handed:  Right  AIMS (if indicated):     Assets:  Desire for Improvement Resilience Social Support  ADL's:  Intact  Cognition:  WNL  Sleep:         Physical Exam: Physical Exam Vitals and nursing note reviewed.  Constitutional:      Appearance: Normal appearance.  HENT:     Head: Normocephalic.     Nose: Nose normal.  Pulmonary:     Effort: Pulmonary effort is normal.  Musculoskeletal:        General: Normal range of motion.     Cervical back: Normal range of motion.  Neurological:     General: No focal deficit present.     Mental Status: He is alert and oriented to person, place, and time.  Psychiatric:        Attention and Perception: Attention and perception normal.        Mood and Affect: Mood is depressed.        Speech: Speech normal.        Behavior: Behavior normal. Behavior is cooperative.        Thought Content: Thought content normal.        Cognition and Memory: Cognition and memory normal.        Judgment: Judgment normal.   Review of Systems  Psychiatric/Behavioral:  Positive  for depression and substance abuse.   All other systems reviewed and are negative. Blood pressure (!) 92/59, pulse 70, temperature 97.6 F (36.4 C), temperature source Oral, resp. rate 18, SpO2 93 %. There is no height or weight on file to calculate BMI.   Demographic Factors:   Male and Caucasian  Loss Factors: NA  Historical Factors: NA  Risk Reduction Factors:   Sense of responsibility to family, Living with another person, especially a relative, Positive social support, and Positive therapeutic relationship  Continued Clinical Symptoms:  Depression, mild  Cognitive Features That Contribute To Risk:  None    Suicide Risk:  Minimal: No identifiable suicidal ideation.  Patients presenting with no risk factors but with morbid ruminations; may be classified as minimal risk based on the severity of the depressive symptoms.   Plan Of Care/Follow-up recommendations:  Alcohol induced mood disorder: -Continue Pristiq 50 mg daily -Discontinue Xanax 1 mg BID PRN -Refrain from alcohol and drug use -Attend 12-step program with a sponsor Activity:  as tolerated Diet:  heart healthy diet  Disposition: discharge home Nanine Means, NP 08/02/2020, 11:10 AM

## 2020-08-02 NOTE — ED Notes (Addendum)
Pt belongings placed in pt bag are as follows:  Blue jeans Black belt [employer] White socks [employer]  Pt counted his own money in his wallet - $155 in cash

## 2020-08-02 NOTE — ED Notes (Signed)
Pt expressing to this RN he is worried about his belongings. Pt stating he has $1,000 in his pants pocket. This RN assuring pt that when he was dressed out in triage his belongings are placed in a bag with his name on it and secured in our locked unit.

## 2020-08-02 NOTE — Discharge Instructions (Addendum)
[facility]  Mental health service in [place], [place] Address: [address], [place], [place] [address] Hours:  Closed ? Opens 8AM Mon  

## 2020-08-02 NOTE — ED Notes (Signed)
Pt given graham crackers and phone to use

## 2020-08-02 NOTE — ED Triage Notes (Signed)
Pt in via [facility] under IVC. Pt admits to drinking a "quart of liquor too much". Denies SI/HI

## 2020-08-03 NOTE — ED Provider Notes (Signed)
[facility] Emergency Department Provider Note   ____________________________________________   Event Date/Time   First MD Initiated Contact with Patient 08/02/20 [phone]     (approximate)  I have reviewed the triage vital signs and the nursing notes.   HISTORY  Chief Complaint Mental Health Problem    HPI Ryan [name] is a 65 y.o. male with the below stated past medical history who presents complaining of alcohol intoxication and requesting resources for rehabilitation.  Is that he has been drinking a quart of liquor per day.  Patient was placed under IVC by law enforcement for making homicidal statements towards his wife.  Patient currently denies any vision changes, tinnitus, difficulty speaking, facial droop, sore throat, chest pain, shortness of breath, abdominal pain, nausea/vomiting/diarrhea, dysuria, or weakness/numbness/paresthesias in any extremity          Past Medical History:  Diagnosis Date   Anxiety    Arthritis    left hand   Asthma    Chronic kidney disease    HAS HAD KIDNEY STONE 2015-- JUST PASSED   COPD (chronic obstructive pulmonary disease) (HCC)    Dyspnea    GERD (gastroesophageal reflux disease)    TAKES TUMS FOR RELIEF   Hyperlipidemia    Hypertension    Improved, No longer on meds   Lower urinary tract symptoms (LUTS)    Prostate disease    Stroke [facility])    Thyroid disease    Wears dentures    hass full upper plate, not wearing, broken   Wears hearing aid     Patient Active Problem List   Diagnosis Date Noted   Alcohol-induced mood disorder (HCC) 08/02/2020   History of CVA (cerebrovascular accident) without residual deficits 10/23/2019   Pre-diabetes 10/23/2019   Mixed hyperlipidemia 10/23/2019   Chronic pain syndrome 08/15/2019   Lumbar spondylosis 03/07/2018   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   History of lumbar fusion 03/07/2018   DDD (degenerative disc disease), lumbosacral 06/02/2014   Spinal  stenosis, lumbar region, with neurogenic claudication 06/02/2014   Lumbar radiculopathy 06/02/2014   Centrilobular emphysema (HCC) 04/19/2014   Chronic LBP 04/19/2014   Essential hypertension 04/19/2014   Compulsive tobacco user syndrome 05/22/2013   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    Past Surgical History:  Procedure Laterality Date   BACK SURGERY     CATARACT EXTRACTION W/PHACO Left 01/06/2016   Procedure: CATARACT EXTRACTION PHACO AND INTRAOCULAR LENS PLACEMENT (IOC);  Surgeon: Nevada Crane, MD;  Location: [facility];  Service: Ophthalmology;  Laterality: Left;  LEFT   COLONOSCOPY WITH PROPOFOL N/A 04/19/2016   Procedure: COLONOSCOPY WITH PROPOFOL;  Surgeon: Scot Jun, MD;  Location: [facility] END[facility]COPY;  Service: Endoscopy;  Laterality: N/A;   ESOPHAGOGASTRODUODENOSCOPY (EGD) WITH PROPOFOL N/A 10/24/2015   Procedure: ESOPHAGOGASTRODUODENOSCOPY (EGD) WITH PROPOFOL;  Surgeon: Scot Jun, MD;  [facility] END[facility]COPY;  Service: Endoscopy;  Laterality: N/A;   EYE SURGERY     HERNIA REPAIR     ERRONEOUS UMBILICAL HERNIA   2011   MYRINGOTOMY WITH TUBE PLACEMENT Bilateral 03/28/2015   Procedure: MYRINGOTOMY WITH TUBE PLACEMENT;  Surgeon: Linus Salmons, MD;  Location: [facility];  Service: ENT;  Laterality: Bilateral;  BUTTERFLY TUBE   SINUSOTOMY  02/28/2019   TONSILLECTOMY     TRANSFORAMINAL LUMBAR INTERBODY FUSION (TLIF) WITH PEDICLE SCREW FIXATION 1 LEVEL Right 06/18/2014   Procedure: TRANSFORAMINAL LUMBAR INTERBODY FUSION (TLIF) WITH PEDICLE SCREW FIXATION 1 LEVEL LUMBAR 5 -SACRAL 1;  Surgeon: Aliene Beamsandy Kritzer, MD;  [facility];  Service: Neurosurgery;  Laterality: Right;  Right transforaminal lumbar interbody fusion with interbody prosthesis and percutaneous pedicle screws Lumbar 5 to Sacral 1    Prior to Admission medications   Medication Sig Start Date End Date Taking? Authorizing Provider  aspirin EC 81 MG EC tablet Take 1  tablet (81 mg total) by mouth daily. Swallow whole. 09/13/19   Shahmehdi, Gemma PayorSeyed A, MD  atorvastatin (LIPITOR) 80 MG tablet TAKE 1 TABLET BY MOUTH AT BEDTIME 06/13/20   Karamalegos, Netta NeatAlexander J, DO  cetirizine (ZYRTEC) 10 MG tablet Take 10 mg by mouth daily.    [provider]  desvenlafaxine (PRISTIQ) 50 MG 24 hr tablet Take 50 mg by mouth daily. 07/18/20   [provider]  gabapentin (NEURONTIN) 400 MG capsule Take 400 mg by mouth in the morning and at bedtime. 400 mg twice a day as per patient --psychiatrist changed that Normally takes 400 mg daily 09/29/19   [provider]  lisinopril (ZESTRIL) 20 MG tablet Take 1 tablet by mouth once daily 05/15/20   Karamalegos, Netta NeatAlexander J, DO  lisinopril (ZESTRIL) 40 MG tablet Take 1 tablet (40 mg total) by mouth daily. 07/31/20   Arnaldo NatalMalinda, Paul F, MD  nicotine (NICODERM CQ - DOSED IN MG/24 HOURS) 21 mg/24hr patch Place 1 patch (21 mg total) onto the skin daily. 09/13/19   ShahmehdiGemma Payor, Seyed A, MD  PROAIR HFA 108 [phone](90 Base) MCG/ACT inhaler INHALE 2 PUFFS BY MOUTH EVERY 6 HOURS AS NEEDED FOR WHEEZING OR SHORTNESS OF BREATH 06/26/20   Karamalegos, Netta NeatAlexander J, DO  sildenafil (REVATIO) 20 MG tablet Take 20-100 mg by mouth as needed (ED).     [provider]  tamsulosin (FLOMAX) 0.4 MG CAPS capsule Take 1 capsule (0.4 mg total) by mouth in the morning and at bedtime. 10/25/19   Smitty CordsKaramalegos, Alexander J, DO    Allergies Patient has no known allergies.  Family History  Problem Relation Age of Onset   Arthritis Mother    Hypertension Mother    Diabetes Father    Hypertension Father     Social History Social History   Tobacco Use   Smoking status: Every Day    Packs/day: 1.50    Years: 40.00    Pack years: 60.00    Types: Cigarettes   Smokeless tobacco: Current   Tobacco comments:    has cut back to 1PPD  Substance Use Topics   Alcohol use: Yes    Alcohol/week: 0.0 standard drinks    Comment: ONCE OR TWICE A YR   Drug use:  No    Review of Systems Constitutional: No fever/chills Eyes: No visual changes. ENT: No sore throat. Cardiovascular: Denies chest pain. Respiratory: Denies shortness of breath. Gastrointestinal: No abdominal pain.  No nausea, no vomiting.  No diarrhea. Genitourinary: Negative for dysuria. Musculoskeletal: Negative for acute arthralgias Skin: Negative for rash. Neurological: Negative for headaches, weakness/numbness/paresthesias in any extremity Psychiatric: Negative for suicidal ideation/homicidal ideation   ____________________________________________   PHYSICAL EXAM:  VITAL SIGNS: ED Triage Vitals  Enc Vitals Group     BP 08/02/20 0703 (!) 92/59     Pulse Rate 08/02/20 0703 70     Resp 08/02/20 0703 18     Temp 08/02/20 0703 97.6 F (36.4 C)     Temp Source 08/02/20 0703 Oral     SpO2 08/02/20 0703 93 %     Weight --      Height --  Head Circumference --      Peak Flow --      Pain Score 08/02/20 0710 0     Pain Loc --      Pain Edu? --      Excl. in GC? --    Constitutional: Alert and oriented. Well appearing and in no acute distress. Eyes: Conjunctivae are normal. PERRL. Head: Atraumatic. Nose: No congestion/rhinnorhea. Mouth/Throat: Mucous membranes are moist. Neck: No stridor Cardiovascular: Grossly normal heart sounds.  Good peripheral circulation. Respiratory: Normal respiratory effort.  No retractions. Gastrointestinal: Soft and nontender. No distention. Musculoskeletal: No obvious deformities Neurologic: Slurred speech and language. No gross focal neurologic deficits are appreciated. Skin:  Skin is warm and dry. No rash noted. Psychiatric: Mood and affect are normal. Speech and behavior are normal.  ____________________________________________   LABS (all labs ordered are listed, but only abnormal results are displayed)  Labs Reviewed  ETHANOL - Abnormal; Notable for the following components:      Result Value   Alcohol, Ethyl (B) 57 (*)     All other components within normal limits  CBC - Abnormal; Notable for the following components:   WBC 16.1 (*)    All other components within normal limits  BASIC METABOLIC PANEL - Abnormal; Notable for the following components:   Potassium 3.2 (*)    Glucose, Bld 129 (*)    All other components within normal limits  RESP PANEL BY RT-PCR (FLU A&B, COVID) [id number]   PROCEDURES  Procedure(s) performed (including Critical Care):  .1-3 Lead EKG Interpretation  Date/Time: 08/03/2020 7:23 AM Performed by: Merwyn Katos, MD Authorized by: Merwyn Katos, MD     Interpretation: normal     ECG rate:  82   ECG rate assessment: normal     Rhythm: sinus rhythm     Ectopy: none     Conduction: normal     ____________________________________________   INITIAL IMPRESSION / ASSESSMENT AND PLAN / ED COURSE  As part of my medical decision making, I reviewed the following data within the electronic medical record, if available:  Nursing notes reviewed and incorporated, Labs reviewed, EKG interpreted, Old chart reviewed, Radiograph reviewed and Notes from prior ED visits reviewed and incorporated        Presents with altered mental status. +Slurred, sluggish behavior. Stated EtOH intoxication. Airway maintained. Unlikely intracranial bleed, opioid intoxication or coingestion, sepsis, hypothyroidism. Suspect likely transient course of intoxication with expected  improvement of symptoms as patient metabolizes offending agent.  Plan: frequent reassessments  Reassessment Note: Time: 3 hours since initial presentation. Evaluation: Frequent mental status exams showed improving symptoms and evidence that the patients AMS was secondary to intoxication.  Patient provided outpatient resources for rehabilitation.  Pt able to ambulate without difficulty and PO tolerant. Plan DC home with ride and return precautions. Disposition: Discharge home        ____________________________________________   FINAL CLINICAL IMPRESSION(S) / ED DIAGNOSES  Final diagnoses:  Alcohol-induced mood disorder [facility])     ED Discharge Orders          Ordered    Increase activity slowly        08/02/20 1116    Diet - low sodium heart healthy        08/02/20 1116    Discharge instructions       Comments: Follow up with [facility]   08/02/20 1116             Note:  This document was prepared using  [employer] and may include unintentional dictation errors.    Merwyn Katos, MD 08/03/20 [phone]

## 2020-08-05 ENCOUNTER — Other Ambulatory Visit: Payer: Self-pay | Admitting: Family Medicine

## 2020-08-05 DIAGNOSIS — J432 Centrilobular emphysema: Secondary | ICD-10-CM

## 2020-08-06 ENCOUNTER — Ambulatory Visit (INDEPENDENT_AMBULATORY_CARE_PROVIDER_SITE_OTHER): Payer: Medicaid Other | Admitting: Family Medicine

## 2020-08-06 ENCOUNTER — Other Ambulatory Visit: Payer: Self-pay | Admitting: Family Medicine

## 2020-08-06 ENCOUNTER — Other Ambulatory Visit: Payer: Self-pay

## 2020-08-06 ENCOUNTER — Encounter: Payer: Self-pay | Admitting: Family Medicine

## 2020-08-06 VITALS — BP 136/81 | HR 81 | Ht 67.0 in | Wt 170.9 lb

## 2020-08-06 DIAGNOSIS — J432 Centrilobular emphysema: Secondary | ICD-10-CM

## 2020-08-06 DIAGNOSIS — I10 Essential (primary) hypertension: Secondary | ICD-10-CM | POA: Diagnosis not present

## 2020-08-06 DIAGNOSIS — E782 Mixed hyperlipidemia: Secondary | ICD-10-CM

## 2020-08-06 MED ORDER — PROAIR HFA 108 (90 BASE) MCG/ACT IN AERS: INHALATION_SPRAY | RESPIRATORY_TRACT | 3 refills | Status: DC

## 2020-08-06 MED ORDER — LISINOPRIL 40 MG PO TABS: 40.0000 mg | ORAL_TABLET | Freq: Every day | ORAL | 1 refills | Status: DC

## 2020-08-06 MED ORDER — ATORVASTATIN CALCIUM 80 MG PO TABS: 80.0000 mg | ORAL_TABLET | Freq: Every day | ORAL | 3 refills | Status: DC

## 2020-08-06 NOTE — Patient Instructions (Addendum)
Thank you for coming to the office today.  Keep on current BP medication  Please schedule a Follow-up Appointment to: Return in about 4 months (around 12/07/2020) for 4 month AM apt Follow-up HTN, labs after chemistry.  If you have any other questions or concerns, please feel free to call the office or send a message through MyChart. You may also schedule an earlier appointment if necessary.  Additionally, you may be receiving a survey about your experience at our office within a few days to 1 week by e-mail or mail. We value your feedback.  Saralyn Pilar, DO [facility], [place]

## 2020-08-06 NOTE — Assessment & Plan Note (Addendum)
Improved HTN now on higher dose Lisinopril, headaches improved - Home BP readings improved 120-130s  Hx CVA    Plan:  1.  Continue current BP regimen Lisinopril 40mg  daily (newly increased by ED) 2. Encourage improved lifestyle - low sodium diet, regular exercise 3. Continue monitor BP outside office, bring readings to next visit, if persistently >140/90 or new symptoms notify office sooner 4. Follow-up 4 months, chemistry

## 2020-08-06 NOTE — Progress Notes (Signed)
Subjective:    Patient ID: Ryan [name], male    DOB: 03-20-1955, 65 y.o.   MRN: [id number]  Ryan [name] is a 65 y.o. male presenting on 08/06/2020 for Hospitalization Follow-up and Hypertension   HPI  ED FOLLOW-UP VISIT  Hospital/Location: [facility] Date of ED Visit: 07/31/20 and 08/02/20  Reason for Presenting to ED: Hypertension  FOLLOW-UP  - ED provider note and record have been reviewed - Patient presents today about 4 days after recent ED visit. Brief summary of recent course, patient had symptoms of headache high blood pressure, presented to ED on 7/7 and 7/9 with alcohol, testing in ED with labs with chemistry CBC urine EKG, he went back on 7/9 with alcohol intoxication K was mild low, treated. - HTN improved on higher dose Lisinopril 10 to 40mg  daily and headaches reduced.  - Today reports overall has done well after discharge from ED. Symptoms of headaches have resolved . BP improved - Headaches improved on higher blood pressure medication now, on less tylenol for headaches.  - Changes to current meds on discharge: Lisinopril 40mg  (dose inc from 10mg )   I have reviewed the discharge medication list, and have reconciled the current and discharge medications today.    Depression screen [facility] 2/9 01/24/2020 10/30/2019 10/23/2019  Decreased Interest 0 0 0  Down, Depressed, Hopeless 0 0 1  PHQ - 2 Score 0 0 1  Altered sleeping - - 0  Tired, decreased energy - - 0  Change in appetite - - 0  Feeling bad or failure about yourself  - - 0  Trouble concentrating - - 0  Moving slowly or fidgety/restless - - 0  Suicidal thoughts - - 0  PHQ-9 Score - - 1  Difficult doing work/chores - - Not difficult at all    Social History   Tobacco Use   Smoking status: Every Day    Packs/day: 1.50    Years: 40.00    Pack years: 60.00    Types: Cigarettes   Smokeless tobacco: Current   Tobacco comments:    has cut back to 1PPD  Substance Use Topics   Alcohol use: Yes    Alcohol/week:  0.0 standard drinks    Comment: ONCE OR TWICE A YR   Drug use: No    Review of Systems Per HPI unless specifically indicated above     Objective:    BP 136/81   Pulse 81   Ht 5\' 7"  (1.702 m)   Wt 170 lb 13.9 oz (77.5 kg)   SpO2 99%   BMI 26.76 kg/m   Wt Readings from Last 3 Encounters:  08/06/20 170 lb 13.9 oz (77.5 kg)  07/31/20 170 lb (77.1 kg)  07/23/20 170 lb (77.1 kg)    Physical Exam Vitals and nursing note reviewed.  Constitutional:      General: He is not in acute distress.    Appearance: He is well-developed. He is not diaphoretic.     Comments: Well-appearing, comfortable, cooperative  HENT:     Head: Normocephalic and atraumatic.  Eyes:     General:        Right eye: No discharge.        Left eye: No discharge.     Conjunctiva/sclera: Conjunctivae normal.  Neck:     Thyroid: No thyromegaly.  Cardiovascular:     Rate and Rhythm: Normal rate and regular rhythm.     Pulses: Normal pulses.     Heart sounds: Normal heart sounds.  No murmur heard. Pulmonary:     Effort: Pulmonary effort is normal. No respiratory distress.     Breath sounds: Normal breath sounds. No wheezing or rales.  Musculoskeletal:        General: Normal range of motion.     Cervical back: Normal range of motion and neck supple.  Lymphadenopathy:     Cervical: No cervical adenopathy.  Skin:    General: Skin is warm and dry.     Findings: No erythema or rash.  Neurological:     Mental Status: He is alert and oriented to person, place, and time. Mental status is at baseline.  Psychiatric:        Behavior: Behavior normal.     Comments: Well groomed, good eye contact, normal speech and thoughts      Results for orders placed or performed during the hospital encounter of 08/02/20  Resp Panel by RT-PCR (Flu A&B, Covid) Nasopharyngeal Swab   Specimen: Nasopharyngeal Swab; Nasopharyngeal(NP) swabs in vial transport medium  Result Value Ref Range   SARS Coronavirus 2 by RT PCR NEGATIVE  NEGATIVE   Influenza A by PCR NEGATIVE NEGATIVE   Influenza B by PCR NEGATIVE NEGATIVE  Ethanol  Result Value Ref Range   Alcohol, Ethyl (B) 57 (H) <10 mg/dL  CBC  Result Value Ref Range   WBC 16.1 (H) 4.0 - 10.5 K/uL   RBC 4.70 4.22 - 5.81 MIL/uL   Hemoglobin 15.2 13.0 - 17.0 g/dL   HCT 63.0 16.0 - 10.9 %   MCV 96.2 80.0 - 100.0 fL   MCH 32.3 26.0 - 34.0 pg   MCHC 33.6 30.0 - 36.0 g/dL   RDW 32.3 55.7 - 32.2 %   Platelets 239 150 - 400 K/uL   nRBC 0.0 0.0 - 0.2 %  Basic metabolic panel  Result Value Ref Range   Sodium 141 135 - 145 mmol/L   Potassium 3.2 (L) 3.5 - 5.1 mmol/L   Chloride 106 98 - 111 mmol/L   CO2 24 22 - 32 mmol/L   Glucose, Bld 129 (H) 70 - 99 mg/dL   BUN 14 8 - 23 mg/dL   Creatinine, Ser 0.25 0.61 - 1.24 mg/dL   Calcium 8.9 8.9 - 42.7 mg/dL   GFR, Estimated >06 >23 mL/min   Anion gap 11 5 - 15      Assessment & Plan:   Problem List Items Addressed This Visit     Essential hypertension - Primary    Improved HTN now on higher dose Lisinopril, headaches improved - Home BP readings improved 120-130s  Hx CVA    Plan:  1.  Continue current BP regimen Lisinopril 40mg  daily (newly increased by ED) 2. Encourage improved lifestyle - low sodium diet, regular exercise 3. Continue monitor BP outside office, bring readings to next visit, if persistently >140/90 or new symptoms notify office sooner 4. Follow-up 4 months, chemistry        Relevant Medications   lisinopril (ZESTRIL) 40 MG tablet    Meds ordered this encounter  Medications   lisinopril (ZESTRIL) 40 MG tablet    Sig: Take 1 tablet (40 mg total) by mouth daily.    Dispense:  90 tablet    Refill:  1    Increase to 90 pill count       Follow up plan: Return in about 4 months (around 12/07/2020) for 4 month AM apt Follow-up HTN, labs after chemistry.   12/09/2020, DO [facility]  [facility] 08/06/2020, 9:12 AM

## 2020-08-11 DIAGNOSIS — H903 Sensorineural hearing loss, bilateral: Secondary | ICD-10-CM | POA: Diagnosis not present

## 2020-08-11 DIAGNOSIS — H9212 Otorrhea, left ear: Secondary | ICD-10-CM | POA: Diagnosis not present

## 2020-09-10 DIAGNOSIS — F331 Major depressive disorder, recurrent, moderate: Secondary | ICD-10-CM | POA: Diagnosis not present

## 2020-09-10 DIAGNOSIS — F411 Generalized anxiety disorder: Secondary | ICD-10-CM | POA: Diagnosis not present

## 2020-09-16 ENCOUNTER — Telehealth: Payer: Self-pay

## 2020-09-16 NOTE — Telephone Encounter (Signed)
Attempted to call patient for pre virtual appointment questions.  Unable to leave voicemail because the mailbox has not been activated.

## 2020-09-17 ENCOUNTER — Other Ambulatory Visit: Payer: Self-pay

## 2020-09-17 ENCOUNTER — Ambulatory Visit
Payer: Medicaid Other | Attending: Student in an Organized Health Care Education/Training Program | Admitting: Student in an Organized Health Care Education/Training Program

## 2020-09-17 ENCOUNTER — Encounter: Payer: Self-pay | Admitting: Student in an Organized Health Care Education/Training Program

## 2020-09-17 DIAGNOSIS — G894 Chronic pain syndrome: Secondary | ICD-10-CM | POA: Diagnosis not present

## 2020-09-17 DIAGNOSIS — M47816 Spondylosis without myelopathy or radiculopathy, lumbar region: Secondary | ICD-10-CM

## 2020-09-17 NOTE — Progress Notes (Signed)
Patient: Ryan [name]  Service Category: E/M  Provider: [name], MD  DOB: May 11, 1955  DOS: 09/17/2020  Location: Office  MRN: [id number]  Setting: Ambulatory outpatient  Referring Provider: Nobie Putnam *  Type: Established Patient  Specialty: Interventional Pain Management  PCP: Olin Hauser, DO  Location: Remote location  Delivery: TeleHealth     Virtual Encounter - Pain Management PROVIDER NOTE: Information contained herein reflects review and annotations entered in association with encounter. Interpretation of such information and data should be left to medically-trained personnel. Information provided to patient can be located elsewhere in the medical record under "Patient Instructions". Document created using STT-dictation technology, any transcriptional errors that may result from process are unintentional.    Contact & Pharmacy Preferred: [phone] Home: [phone] (home) Mobile: [phone] (mobile) E-mail: [email or web address]  [place] ([address] [place]) [place] [address] Phone: [phone] Fax: [phone]   Pre-screening  Mr. Niznik offered "in-person" vs "virtual" encounter. He indicated preferring virtual for this encounter.   Reason COVID-19*  Social distancing based on CDC and AMA recommendations.   I contacted RAEKWON WINKOWSKI on 09/17/2020 via video conference.      I clearly identified myself as [name], MD. I verified that I was speaking with the correct person using two identifiers (Name: TREVEN HOLTMAN, and date of birth: 04/15/1955).  Consent I sought verbal advanced consent from Ryan [name] for virtual visit interactions. I informed Mr. Hissong of possible security and privacy concerns, risks, and limitations associated with providing "not-in-person" medical evaluation and management services. I also informed Mr. Fant of the availability of "in-person"  appointments. Finally, I informed him that there would be a charge for the virtual visit and that he could be  personally, fully or partially, financially responsible for it. Mr. Keaney expressed understanding and agreed to proceed.   Historic Elements   Mr. DIVANTE KOTCH is a 65 y.o. year old, male patient evaluated today after our last contact on 07/23/2020. Mr. Brisbin  has a past medical history of Anxiety, Arthritis, Asthma, Chronic kidney disease, COPD (chronic obstructive pulmonary disease) ([place]), Dyspnea, GERD (gastroesophageal reflux disease), Hyperlipidemia, Hypertension, Lower urinary tract symptoms (LUTS), Prostate disease, Stroke [facility]), Thyroid disease, Wears dentures, and Wears hearing aid. He also  has a past surgical history that includes Eye surgery; Hernia repair; Transforaminal lumbar interbody fusion (tlif) with pedicle screw fixation 1 level (Right, 06/18/2014); Myringotomy with tube placement (Bilateral, 03/28/2015); Back surgery; Esophagogastroduodenoscopy (egd) with propofol (N/A, 10/24/2015); Cataract extraction w/PHACO (Left, 01/06/2016); Tonsillectomy; Colonoscopy with propofol (N/A, 04/19/2016); and Sinusotomy (02/28/2019). Mr. Beaumier has a current medication list which includes the following prescription(s): aspirin, atorvastatin, cetirizine, gabapentin, lisinopril, nicotine, proair hfa, sildenafil, and tamsulosin. He  reports that he has been smoking cigarettes. He has a 60.00 pack-year smoking history. He uses smokeless tobacco. He reports current alcohol use. He reports that he does not use drugs. Mr. Zurn has No Known Allergies.   HPI  Today, he is being contacted for a post-procedure assessment.  ED admission/hospitalization for alcohol intoxication on 08/02/2020. Otherwise, patient received satisfactory response from his lumbar facet medial branch nerve block done on 07/23/2020.  He has these done every 3 months.    Post-Procedure Evaluation  Procedure (07/23/2020):  Type: Lumbar  Facet, Medial Branch Block(s) #3 2022 Primary Purpose: Therapeutic Region: Posterolateral Lumbosacral Spine Level: L3, L4, L5,  Medial Branch Level(s). Injecting these levels blocks the L3-4, L4-5 lumbar  facet joints. Laterality: Bilateral  Anxiolysis: Please see nurses note.  Effectiveness during initial hour after procedure (Ultra-Short Term Relief): 100 %   Local anesthetic used: Long-acting (4-6 hours) Effectiveness: Defined as any analgesic benefit obtained secondary to the administration of local anesthetics. This carries significant diagnostic value as to the etiological location, or anatomical origin, of the pain. Duration of benefit is expected to coincide with the duration of the local anesthetic used.  Effectiveness during initial 4-6 hours after procedure (Short-Term Relief): 100 %   Long-term benefit: Defined as any relief past the pharmacologic duration of the local anesthetics.  Effectiveness past the initial 6 hours after procedure (Long-Term Relief): 80% for first 4 weeks  Benefits, current: Defined as benefit present at the time of this evaluation.   Analgesia:  50%    Laboratory Chemistry Profile   Renal Lab Results  Component Value Date   BUN 14 08/02/2020   CREATININE 1.04 08/02/2020   GFRAA >60 09/11/2019   GFRNONAA >60 08/02/2020    Hepatic Lab Results  Component Value Date   AST 19 07/04/2020   ALT 16 07/04/2020   ALBUMIN 4.6 07/04/2020   ALKPHOS 102 07/04/2020   LIPASE 77 (H) 10/19/2017    Electrolytes Lab Results  Component Value Date   NA 141 08/02/2020   K 3.2 (L) 08/02/2020   CL 106 08/02/2020   CALCIUM 8.9 08/02/2020    Bone No results found for: VD25OH, VD125OH2TOT, HQ7591MB8, GY6599JT7, 25OHVITD1, 25OHVITD2, 25OHVITD3, TESTOFREE, TESTOSTERONE  Inflammation (CRP: Acute Phase) (ESR: Chronic Phase) No results found for: CRP, ESRSEDRATE, LATICACIDVEN       Note: Above Lab results reviewed.   Assessment  The primary encounter diagnosis  was Lumbar spondylosis. Diagnoses of Lumbar facet arthropathy and Chronic pain syndrome were also pertinent to this visit.  Plan of Care    Mr. ADRIENE PADULA has a current medication list which includes the following long-term medication(s): atorvastatin, cetirizine, gabapentin, lisinopril, and proair hfa.    Orders:  Orders Placed This Encounter  Procedures   LUMBAR FACET(MEDIAL BRANCH NERVE BLOCK) MBNB    Standing Status:   Future    Standing Expiration Date:   10/18/2020    Scheduling Instructions:     Procedure: Lumbar facet block (AKA.: Lumbosacral medial branch nerve block)     Side: Bilateral     Level: L3-4, L4-5, facets (L3, L4, L5, Medial Branch Nerves)     Sedation: with     Timeframe: ASAA    Order Specific Question:   Where will this procedure be performed?    Answer:   [facility]   Follow-up plan:   Return in about 4 weeks (around 10/15/2020) for B/L L3, 4, 5 Facets , moderate sedation (ECT suite).     Status post bilateral L3, L4, L5, S1 lumbar facet medial branch nerve blocks on 03/20/2018 helped 85% pain relief for approximately 1 year. Status post left L3, L4, L5 RFA on 07/11/2019, Right L3, L4, L5 RFA 07/25/19. Not as helpful as lumbar facet MBBs- repeat PRN, consider SPRINT PNS            Recent Visits Date Type Provider Dept  07/23/20 Procedure visit [name], MD [facility]  07/01/20 Telemedicine [name], MD [facility]  Showing recent visits within past 90 days and meeting all other requirements Today's Visits Date Type Provider Dept  09/17/20 Telemedicine [name], MD [facility]  Showing today's visits and meeting all other requirements Future  Appointments No visits were found meeting these conditions. Showing future appointments within next 90 days and meeting all other requirements I discussed the assessment and treatment plan with the patient. The patient was provided an opportunity to ask questions  and all were answered. The patient agreed with the plan and demonstrated an understanding of the instructions.  Patient advised to call back or seek an in-person evaluation if the symptoms or condition worsens.  Duration of encounter: 63mnutes.  Note by: BGillis Santa MD Date: 09/17/2020; Time: 9:56 AM

## 2020-10-06 ENCOUNTER — Ambulatory Visit: Payer: Medicaid Other | Admitting: Student in an Organized Health Care Education/Training Program

## 2020-10-08 DIAGNOSIS — F331 Major depressive disorder, recurrent, moderate: Secondary | ICD-10-CM | POA: Diagnosis not present

## 2020-10-08 DIAGNOSIS — F411 Generalized anxiety disorder: Secondary | ICD-10-CM | POA: Diagnosis not present

## 2020-10-15 ENCOUNTER — Ambulatory Visit (HOSPITAL_BASED_OUTPATIENT_CLINIC_OR_DEPARTMENT_OTHER): Payer: Medicaid Other | Admitting: Student in an Organized Health Care Education/Training Program

## 2020-10-15 ENCOUNTER — Encounter: Payer: Self-pay | Admitting: Student in an Organized Health Care Education/Training Program

## 2020-10-15 ENCOUNTER — Other Ambulatory Visit: Payer: Self-pay | Admitting: Student in an Organized Health Care Education/Training Program

## 2020-10-15 ENCOUNTER — Ambulatory Visit
Admission: RE | Admit: 2020-10-15 | Discharge: 2020-10-15 | Disposition: A | Discharge status: Home / Self Care | Payer: Medicaid Other | Source: Ambulatory Visit | Attending: Student in an Organized Health Care Education/Training Program | Admitting: Student in an Organized Health Care Education/Training Program

## 2020-10-15 ENCOUNTER — Other Ambulatory Visit: Payer: Self-pay

## 2020-10-15 DIAGNOSIS — G894 Chronic pain syndrome: Secondary | ICD-10-CM

## 2020-10-15 DIAGNOSIS — M47816 Spondylosis without myelopathy or radiculopathy, lumbar region: Secondary | ICD-10-CM | POA: Diagnosis not present

## 2020-10-15 DIAGNOSIS — R52 Pain, unspecified: Secondary | ICD-10-CM

## 2020-10-15 MED ORDER — DEXAMETHASONE SODIUM PHOSPHATE 10 MG/ML IJ SOLN
10.0000 mg | Freq: Once | INTRAMUSCULAR | Status: AC
  Administered 2020-10-15: 10 mg

## 2020-10-15 MED ORDER — DEXAMETHASONE SODIUM PHOSPHATE 10 MG/ML IJ SOLN
INTRAMUSCULAR | Status: AC
  Filled 2020-10-15: qty 1

## 2020-10-15 MED ORDER — MIDAZOLAM HCL 5 MG/5ML IJ SOLN
INTRAMUSCULAR | Status: AC
  Filled 2020-10-15: qty 5

## 2020-10-15 MED ORDER — LACTATED RINGERS IV SOLN
1000.0000 mL | Freq: Once | INTRAVENOUS | Status: AC
  Administered 2020-10-15: 1000 mL via INTRAVENOUS

## 2020-10-15 MED ORDER — ROPIVACAINE HCL 2 MG/ML IJ SOLN
9.0000 mL | Freq: Once | INTRAMUSCULAR | Status: AC
  Administered 2020-10-15: 9 mL via PERINEURAL

## 2020-10-15 MED ORDER — MIDAZOLAM HCL 2 MG/2ML IJ SOLN
1.0000 mg | INTRAMUSCULAR | Status: DC | PRN
  Administered 2020-10-15: 2 mg via INTRAVENOUS

## 2020-10-15 MED ORDER — FENTANYL CITRATE (PF) 100 MCG/2ML IJ SOLN
INTRAMUSCULAR | Status: AC
  Filled 2020-10-15: qty 2

## 2020-10-15 MED ORDER — LIDOCAINE HCL (PF) 2 % IJ SOLN
INTRAMUSCULAR | Status: AC
  Filled 2020-10-15: qty 20

## 2020-10-15 MED ORDER — FENTANYL CITRATE (PF) 100 MCG/2ML IJ SOLN
25.0000 ug | INTRAMUSCULAR | Status: DC | PRN
  Administered 2020-10-15: 75 ug via INTRAVENOUS

## 2020-10-15 MED ORDER — LIDOCAINE HCL 2 % IJ SOLN
20.0000 mL | Freq: Once | INTRAMUSCULAR | Status: AC
  Administered 2020-10-15: 400 mg
  Filled 2020-10-15: qty 20

## 2020-10-15 NOTE — Progress Notes (Signed)
Safety precautions to be maintained throughout the outpatient stay will include: orient to surroundings, keep bed in low position, maintain call bell within reach at all times, provide assistance with transfer out of bed and ambulation.  

## 2020-10-15 NOTE — Patient Instructions (Signed)
____________________________________________________________________________________________  Post-Procedure Discharge Instructions  Instructions:  Apply ice:   Purpose: This will minimize any swelling and discomfort after procedure.   When: Day of procedure, as soon as you get home.  How: Fill a plastic sandwich bag with crushed ice. Cover it with a Terry towel and apply to injection site.  How long: (15 min on, 15 min off) Apply for 15 minutes then remove x 15 minutes.  Repeat sequence on day of procedure, until you go to bed.  Apply heat:   Purpose: To treat any soreness and discomfort from the procedure.  When: Starting the next day after the procedure.  How: Apply heat to procedure site starting the day following the procedure.  How long: May continue to repeat daily, until discomfort goes away.  Food intake: Start with clear liquids (like water) and advance to regular food, as tolerated.   Physical activities: Keep activities to a minimum for the first 8 hours after the procedure. After that, then as tolerated.  Driving: If you have received any sedation, be responsible and do not drive. You are not allowed to drive for 24 hours after having sedation.  Blood thinner: (Applies only to those taking blood thinners) You may restart your blood thinner 6 hours after your procedure.  Insulin: (Applies only to Diabetic patients taking insulin) As soon as you can eat, you may resume your normal dosing schedule.  Infection prevention: Keep procedure site clean and dry. Shower daily and clean area with soap and water.  Post-procedure Pain Diary: Extremely important that this be done correctly and accurately. Recorded information will be used to determine the next step in treatment. For the purpose of accuracy, follow these rules:  Evaluate only the area treated. Do not report or include pain from an untreated area. For the purpose of this evaluation, ignore all other areas of pain,  except for the treated area.  After your procedure, avoid taking a long nap and attempting to complete the pain diary after you wake up. Instead, set your alarm clock to go off every hour, on the hour, for the initial 8 hours after the procedure. Document the duration of the numbing medicine, and the relief you are getting from it.  Do not go to sleep and attempt to complete it later. It will not be accurate. If you received sedation, it is likely that you were given a medication that may cause amnesia. Because of this, completing the diary at a later time may cause the information to be inaccurate. This information is needed to plan your care.  Follow-up appointment: Keep your post-procedure follow-up evaluation appointment after the procedure (usually 2 weeks for most procedures, 6 weeks for radiofrequencies). DO NOT FORGET to bring you pain diary with you.   Expect: (What should I expect to see with my procedure?)  From numbing medicine (AKA: Local Anesthetics): Numbness or decrease in pain. You may also experience some weakness, which if present, could last for the duration of the local anesthetic.  Onset: Full effect within 15 minutes of injected.  Duration: It will depend on the type of local anesthetic used. On the average, 1 to 8 hours.   From steroids (Applies only if steroids were used): Decrease in swelling or inflammation. Once inflammation is improved, relief of the pain will follow.  Onset of benefits: Depends on the amount of swelling present. The more swelling, the longer it will take for the benefits to be seen. In some cases, up to 10 days.    Duration: Steroids will stay in the system x 2 weeks. Duration of benefits will depend on multiple posibilities including persistent irritating factors.  Side-effects: If present, they may typically last 2 weeks (the duration of the steroids).  Frequent: Cramps (if they occur, drink Gatorade and take over-the-counter Magnesium 450-500 mg  once to twice a day); water retention with temporary weight gain; increases in blood sugar; decreased immune system response; increased appetite.  Occasional: Facial flushing (red, warm cheeks); mood swings; menstrual changes.  Uncommon: Long-term decrease or suppression of natural hormones; bone thinning. (These are more common with higher doses or more frequent use. This is why we prefer that our patients avoid having any injection therapies in other practices.)   Very Rare: Severe mood changes; psychosis; aseptic necrosis.  From procedure: Some discomfort is to be expected once the numbing medicine wears off. This should be minimal if ice and heat are applied as instructed.  Call if: (When should I call?)  You experience numbness and weakness that gets worse with time, as opposed to wearing off.  New onset bowel or bladder incontinence. (Applies only to procedures done in the spine)  Emergency Numbers:  Durning business hours (Monday - Thursday, 8:00 AM - 4:00 PM) (Friday, 9:00 AM - 12:00 Noon): [phone]  After hours: [phone]  NOTE: If you are having a problem and are unable connect with, or to talk to a provider, then go to your nearest urgent care or emergency department. If the problem is serious and urgent, please call 911. ____________________________________________________________________________________________    

## 2020-10-15 NOTE — Progress Notes (Signed)
Patient's Name: Ryan [name]  MRN: [id number]  Referring Provider: Edward Jolly, MD  DOB: 02-Oct-1955  PCP: Smitty Cords, DO  DOS: 10/15/2020  Note by: Edward Jolly, MD  Service setting: Ambulatory outpatient  Specialty: Interventional Pain Management  Patient type: Established  Location: [facility] (AMB) Pain Management Facility  Visit type: Interventional Procedure   Primary Reason for Visit: Interventional Pain Management Treatment. CC: Back Pain (lower)  Procedure:          Anesthesia, Analgesia, Anxiolysis:  Type: Lumbar Facet, Medial Branch Block(s) #4 2022 Primary Purpose: Therapeutic Region: Posterolateral Lumbosacral Spine Level: L3, L4, L5,  Medial Branch Level(s). Injecting these levels blocks the L3-4, L4-5 lumbar facet joints. Laterality: Bilateral  Type: moderate sedation Route: Intravenous (IV) IV Access: Secured Sedation: Meaningful verbal contact was maintained at all times during the procedure  Local Anesthetic: Lidocaine 1-2%  Position: Prone   Indications: 1. Lumbar spondylosis   2. Lumbar facet arthropathy   3. Chronic pain syndrome    Pain Score: Pre-procedure: 7 /10 Post-procedure: 1 /10  Pre-op Assessment:  Mr. Augustus is a 65 y.o. (year old), male patient, seen today for interventional treatment. He  has a past surgical history that includes Eye surgery; Hernia repair; Transforaminal lumbar interbody fusion (tlif) with pedicle screw fixation 1 level (Right, 06/18/2014); Myringotomy with tube placement (Bilateral, 03/28/2015); Back surgery; Esophagogastroduodenoscopy (egd) with propofol (N/A, 10/24/2015); Cataract extraction w/PHACO (Left, 01/06/2016); Tonsillectomy; Colonoscopy with propofol (N/A, 04/19/2016); and Sinusotomy (02/28/2019). Mr. Lona has a current medication list which includes the following prescription(s): aspirin, atorvastatin, cetirizine, gabapentin, lisinopril, nicotine, proair hfa, sildenafil, and tamsulosin, and the following  Facility-Administered Medications: fentanyl and midazolam. His primarily concern today is the Back Pain (lower)  Initial Vital Signs:  Pulse/HCG Rate: 84ECG Heart Rate: 74 Temp: (!) 97.2 F (36.2 C) Resp: 16 BP: 138/80 SpO2: 98 %  BMI: Estimated body mass index is 26.31 kg/m as calculated from the following:   Height as of this encounter: 5\' 7"  (1.702 m).   Weight as of this encounter: 168 lb (76.2 kg).  Risk Assessment: Allergies: Reviewed. He has No Known Allergies.  Allergy Precautions: None required Coagulopathies: Reviewed. None identified.  Blood-thinner therapy: None at this time Active Infection(s): Reviewed. None identified. Mr. Bhagat is afebrile  Site Confirmation: Mr. Klee was asked to confirm the procedure and laterality before marking the site Procedure checklist: Completed Consent: Before the procedure and under the influence of no sedative(s), amnesic(s), or anxiolytics, the patient was informed of the treatment options, risks and possible complications. To fulfill our ethical and legal obligations, as recommended by the American Medical Association's Code of Ethics, I have informed the patient of my clinical impression; the nature and purpose of the treatment or procedure; the risks, benefits, and possible complications of the intervention; the alternatives, including doing nothing; the risk(s) and benefit(s) of the alternative treatment(s) or procedure(s); and the risk(s) and benefit(s) of doing nothing. The patient was provided information about the general risks and possible complications associated with the procedure. These may include, but are not limited to: failure to achieve desired goals, infection, bleeding, organ or nerve damage, allergic reactions, paralysis, and death. In addition, the patient was informed of those risks and complications associated to Spine-related procedures, such as failure to decrease pain; infection (i.e.: Meningitis, epidural or  intraspinal abscess); bleeding (i.e.: epidural hematoma, subarachnoid hemorrhage, or any other type of intraspinal or peri-dural bleeding); organ or nerve damage (i.e.: Any type of peripheral nerve, nerve root, or  spinal cord injury) with subsequent damage to sensory, motor, and/or autonomic systems, resulting in permanent pain, numbness, and/or weakness of one or several areas of the body; allergic reactions; (i.e.: anaphylactic reaction); and/or death. Furthermore, the patient was informed of those risks and complications associated with the medications. These include, but are not limited to: allergic reactions (i.e.: anaphylactic or anaphylactoid reaction(s)); adrenal axis suppression; blood sugar elevation that in diabetics may result in ketoacidosis or comma; water retention that in patients with history of congestive heart failure may result in shortness of breath, pulmonary edema, and decompensation with resultant heart failure; weight gain; swelling or edema; medication-induced neural toxicity; particulate matter embolism and blood vessel occlusion with resultant organ, and/or nervous system infarction; and/or aseptic necrosis of one or more joints. Finally, the patient was informed that Medicine is not an exact science; therefore, there is also the possibility of unforeseen or unpredictable risks and/or possible complications that may result in a catastrophic outcome. The patient indicated having understood very clearly. We have given the patient no guarantees and we have made no promises. Enough time was given to the patient to ask questions, all of which were answered to the patient's satisfaction. Mr. Fehnel has indicated that he wanted to continue with the procedure. Attestation: I, the ordering provider, attest that I have discussed with the patient the benefits, risks, side-effects, alternatives, likelihood of achieving goals, and potential problems during recovery for the procedure that I have  provided informed consent. Date  Time: 10/15/2020  8:43 AM  Pre-Procedure Preparation:  Monitoring: As per clinic protocol. Respiration, ETCO2, SpO2, BP, heart rate and rhythm monitor placed and checked for adequate function Safety Precautions: Patient was assessed for positional comfort and pressure points before starting the procedure. Time-out: I initiated and conducted the "Time-out" before starting the procedure, as per protocol. The patient was asked to participate by confirming the accuracy of the "Time Out" information. Verification of the correct person, site, and procedure were performed and confirmed by me, the nursing staff, and the patient. "Time-out" conducted as per Joint Commission's Universal Protocol (UP.01.01.01). Time: [phone]  Description of Procedure:          Laterality: Bilateral. The procedure was performed in identical fashion on both sides. Levels:  L3, L4, L5,  Medial Branch Level(s) Area Prepped: Posterior Lumbosacral Region Prepping solution: ChloraPrep (2% chlorhexidine gluconate and 70% isopropyl alcohol) Safety Precautions: Aspiration looking for blood return was conducted prior to all injections. At no point did we inject any substances, as a needle was being advanced. Before injecting, the patient was told to immediately notify me if he was experiencing any new onset of "ringing in the ears, or metallic taste in the mouth". No attempts were made at seeking any paresthesias. Safe injection practices and needle disposal techniques used. Medications properly checked for expiration dates. SDV (single dose vial) medications used. After the completion of the procedure, all disposable equipment used was discarded in the proper designated medical waste containers. Local Anesthesia: Protocol guidelines were followed. The patient was positioned over the fluoroscopy table. The area was prepped in the usual manner. The time-out was completed. The target area was identified using  fluoroscopy. A 12-in long, straight, sterile hemostat was used with fluoroscopic guidance to locate the targets for each level blocked. Once located, the skin was marked with an approved surgical skin marker. Once all sites were marked, the skin (epidermis, dermis, and hypodermis), as well as deeper tissues (fat, connective tissue and muscle) were infiltrated with a  Mattia amount of a short-acting local anesthetic, loaded on a 10cc syringe with a 25G, 1.5-in  Needle. An appropriate amount of time was allowed for local anesthetics to take effect before proceeding to the next step. Local Anesthetic: Lidocaine 2.0% The unused portion of the local anesthetic was discarded in the proper designated containers. Technical explanation of process:   L3 Medial Branch Nerve Block (MBB): The target area for the L3 medial branch is at the junction of the postero-lateral aspect of the superior articular process and the superior, posterior, and medial edge of the transverse process of L4. Under fluoroscopic guidance, a Quincke needle was inserted until contact was made with os over the superior postero-lateral aspect of the pedicular shadow (target area). After negative aspiration for blood,1 cc  mL of the nerve block solution was injected without difficulty or complication. The needle was removed intact. L4 Medial Branch Nerve Block (MBB): The target area for the L4 medial branch is at the junction of the postero-lateral aspect of the superior articular process and the superior, posterior, and medial edge of the transverse process of L5. Under fluoroscopic guidance, a Quincke needle was inserted until contact was made with os over the superior postero-lateral aspect of the pedicular shadow (target area). After negative aspiration for blood,1cc mL of the nerve block solution was injected without difficulty or complication. The needle was removed intact. L5 Medial Branch Nerve Block (MBB): The target area for the L5 medial  branch is at the junction of the postero-lateral aspect of the superior articular process and the superior, posterior, and medial edge of the sacral ala. Under fluoroscopic guidance, a Quincke needle was inserted until contact was made with os over the superior postero-lateral aspect of the pedicular shadow (target area). After negative aspiration for blood,1cc  mL of the nerve block solution was injected without difficulty or complication. The needle was removed intact.   Nerve block solution: 10 cc solution made of 8 cc of 0.2% ropivacaine, 2 cc of Decadron 10 mg/cc.  1-1.5 cc injected at each level above bilaterally  Procedural Needles: 22-gauge, 3.5-inch, Quincke needles used for all levels.  Once the entire procedure was completed, the treated area was cleaned, making sure to leave some of the prepping solution back to take advantage of its long term bactericidal properties.   Illustration of the posterior view of the lumbar spine and the posterior neural structures. Laminae of L2 through S1 are labeled. DPRL5, dorsal primary ramus of L5; DPRS1, dorsal primary ramus of S1; DPR3, dorsal primary ramus of L3; FJ, facet (zygapophyseal) joint L3-L4; I, inferior articular process of L4; LB1, lateral branch of dorsal primary ramus of L1; IAB, inferior articular branches from L3 medial branch (supplies L4-L5 facet joint); IBP, intermediate branch plexus; MB3, medial branch of dorsal primary ramus of L3; NR3, third lumbar nerve root; S, superior articular process of L5; SAB, superior articular branches from L4 (supplies L4-5 facet joint also); TP3, transverse process of L3.  Vitals:   10/15/20 0952 10/15/20 1000 10/15/20 1010 10/15/20 1020  BP: 116/77 130/80 133/87 132/85  Pulse:      Resp: 15  15   Temp:  (!) 97.1 F (36.2 C)  (!) 97.1 F (36.2 C)  TempSrc:      SpO2: 94% 98% 98% 98%  Weight:      Height:         Start Time: 0942 hrs. End Time: 0951 hrs.  Imaging Guidance (Spinal):  Type of Imaging Technique: Fluoroscopy Guidance (Spinal) Indication(s): Assistance in needle guidance and placement for procedures requiring needle placement in or near specific anatomical locations not easily accessible without such assistance. Exposure Time: Please see nurses notes. Contrast: None used. Fluoroscopic Guidance: I was personally present during the use of fluoroscopy. "Tunnel Vision Technique" used to obtain the best possible view of the target area. Parallax error corrected before commencing the procedure. "Direction-depth-direction" technique used to introduce the needle under continuous pulsed fluoroscopy. Once target was reached, antero-posterior, oblique, and lateral fluoroscopic projection used confirm needle placement in all planes. Images permanently stored in EMR. Interpretation: No contrast injected. I personally interpreted the imaging intraoperatively. Adequate needle placement confirmed in multiple planes. Permanent images saved into the patient's record.   Post-operative Assessment:  Post-procedure Vital Signs:  Pulse/HCG Rate: 8476 Temp: (!) 97.1 F (36.2 C) Resp: 15 BP: 132/85 SpO2: 98 %  EBL: None  Complications: No immediate post-treatment complications observed by team, or reported by patient.  Note: The patient tolerated the entire procedure well. A repeat set of vitals were taken after the procedure and the patient was kept under observation following institutional policy, for this type of procedure. Post-procedural neurological assessment was performed, showing return to baseline, prior to discharge. The patient was provided with post-procedure discharge instructions, including a section on how to identify potential problems. Should any problems arise concerning this procedure, the patient was given instructions to immediately contact us, at any time, without hesitation. In any case, we plan to contact the patient by telephone for a follow-up status report  regarding this interventional procedure.  Comments:  No additional relevant information.  Plan of Care   Imaging Orders         DG PAIN CLINIC C-ARM 1-60 MIN NO REPORT     Medications ordered for procedure: Meds ordered this encounter  Medications   lidocaine (XYLOCAINE) 2 % (with pres) injection 400 mg   lactated ringers infusion 1,000 mL   dexamethasone (DECADRON) injection 10 mg   dexamethasone (DECADRON) injection 10 mg   ropivacaine (PF) 2 mg/mL (0.2%) (NAROPIN) injection 9 mL   ropivacaine (PF) 2 mg/mL (0.2%) (NAROPIN) injection 9 mL   fentaNYL (SUBLIMAZE) injection 25-50 mcg    Make sure Narcan is available in the pyxis when using this medication. In the event of respiratory depression (RR< 8/min): Titrate NARCAN (naloxone) in increments of 0.1 to 0.2 mg IV at 2-3 minute intervals, until desired degree of reversal.   midazolam (VERSED) injection 1-2 mg    Make sure Flumazenil is available in the pyxis when using this medication. If oversedation occurs, administer 0.2 mg IV over 15 sec. If after 45 sec no response, administer 0.2 mg again over 1 min; may repeat at 1 min intervals; not to exceed 4 doses (1 mg)   Medications administered: We administered lidocaine, lactated ringers, dexamethasone, dexamethasone, ropivacaine (PF) 2 mg/mL (0.2%), ropivacaine (PF) 2 mg/mL (0.2%), fentaNYL, and midazolam.  See the medical record for exact dosing, route, and time of administration.  Disposition: Discharge home  Discharge Date & Time: 10/15/2020; 1028 hrs.   Physician-requested Follow-up: Return in about 8 weeks (around 12/10/2020) for Post Procedure Evaluation, virtual.  Future Appointments  Date Time Provider Department Center  11/26/2020  3:20 PM Edward Jolly, MD [facility] None  12/10/2020  9:00 AM Althea Charon Netta Neat, DO [facility]    Primary Care Physician: Smitty Cords, DO Location: [facility] Note by: Edward Jolly,  MD Date:  10/15/2020; Time: 10:40 AM  Disclaimer:  Medicine is not an Visual merchandiser. The only guarantee in medicine is that nothing is guaranteed. It is important to note that the decision to proceed with this intervention was based on the information collected from the patient. The Data and conclusions were drawn from the patient's questionnaire, the interview, and the physical examination. Because the information was provided in large part by the patient, it cannot be guaranteed that it has not been purposely or unconsciously manipulated. Every effort has been made to obtain as much relevant data as possible for this evaluation. It is important to note that the conclusions that lead to this procedure are derived in large part from the available data. Always take into account that the treatment will also be dependent on availability of resources and existing treatment guidelines, considered by other Pain Management Practitioners as being common knowledge and practice, at the time of the intervention. For Medico-Legal purposes, it is also important to point out that variation in procedural techniques and pharmacological choices are the acceptable norm. The indications, contraindications, technique, and results of the above procedure should only be interpreted and judged by a Board-Certified Interventional Pain Specialist with extensive familiarity and expertise in the same exact procedure and technique.

## 2020-10-16 ENCOUNTER — Telehealth: Payer: Self-pay

## 2020-10-16 DIAGNOSIS — F341 Dysthymic disorder: Secondary | ICD-10-CM | POA: Diagnosis not present

## 2020-10-16 DIAGNOSIS — F411 Generalized anxiety disorder: Secondary | ICD-10-CM | POA: Diagnosis not present

## 2020-10-16 NOTE — Telephone Encounter (Signed)
Post procedure phone call. Patient states he is doing well.  

## 2020-11-26 ENCOUNTER — Encounter: Payer: Self-pay | Admitting: Student in an Organized Health Care Education/Training Program

## 2020-11-26 ENCOUNTER — Ambulatory Visit
Payer: Medicaid Other | Attending: Student in an Organized Health Care Education/Training Program | Admitting: Student in an Organized Health Care Education/Training Program

## 2020-11-26 ENCOUNTER — Other Ambulatory Visit: Payer: Self-pay

## 2020-11-26 DIAGNOSIS — G894 Chronic pain syndrome: Secondary | ICD-10-CM | POA: Diagnosis not present

## 2020-11-26 DIAGNOSIS — M5137 Other intervertebral disc degeneration, lumbosacral region: Secondary | ICD-10-CM | POA: Diagnosis not present

## 2020-11-26 DIAGNOSIS — M47816 Spondylosis without myelopathy or radiculopathy, lumbar region: Secondary | ICD-10-CM

## 2020-11-26 NOTE — Progress Notes (Signed)
Patient: Ryan [name]  Service Category: E/M  Provider: [name], MD  DOB: [birth year]  DOS: 11/26/2020  Location: Office  MRN: [id number]  Setting: Ambulatory outpatient  Referring Provider: Nobie Putnam *  Type: Established Patient  Specialty: Interventional Pain Management  PCP: Olin Hauser, DO  Location: Remote location  Delivery: TeleHealth     Virtual Encounter - Pain Management PROVIDER NOTE: Information contained herein reflects review and annotations entered in association with encounter. Interpretation of such information and data should be left to medically-trained personnel. Information provided to patient can be located elsewhere in the medical record under "Patient Instructions". Document created using STT-dictation technology, any transcriptional errors that may result from process are unintentional.    Contact & Pharmacy Preferred: [phone] Home: [phone] (home) Mobile: [phone] (mobile) E-mail: [email or web address]  [id number] - [address]), [place] [address] Phone: [phone] Fax: [phone]   Pre-screening  Ryan [name] offered "in-person" vs "virtual" encounter. He indicated preferring virtual for this encounter.   Reason COVID-19*  Social distancing based on CDC and AMA recommendations.   I contacted Ryan [name] on 11/26/2020 via telephone.      I clearly identified myself as 65/02/2020, MD. I verified that I was speaking with the correct person using two identifiers (Name: Ryan [name], and date of birth: [birth year]).  Consent I sought verbal advanced consent from 65/31/1957 for virtual visit interactions. I informed Ryan [name] of possible security and privacy concerns, risks, and limitations associated with providing "not-in-person" medical evaluation and management services. I also informed Ryan [name] of the availability of "in-person"  appointments. Finally, I informed him that there would be a charge for the virtual visit and that he could be  personally, fully or partially, financially responsible for it. Ryan [name] expressed understanding and agreed to proceed.   Historic Elements   Ryan [name] is a 65 y.o. year old, male patient evaluated today after our last contact on 10/15/2020. Ryan [name]  has a past medical history of Anxiety, Arthritis, Asthma, Chronic kidney disease, COPD (chronic obstructive pulmonary disease) ([place]), Dyspnea, GERD (gastroesophageal reflux disease), Hyperlipidemia, Hypertension, Lower urinary tract symptoms (LUTS), Prostate disease, Stroke [facility]), Thyroid disease, Wears dentures, and Wears hearing aid. He also  has a past surgical history that includes Eye surgery; Hernia repair; Transforaminal lumbar interbody fusion (tlif) with pedicle screw fixation 1 level (Right, 06/18/2014); Myringotomy with tube placement (Bilateral, 03/28/2015); Back surgery; Esophagogastroduodenoscopy (egd) with propofol (N/A, 10/24/2015); Cataract extraction w/PHACO (Left, 01/06/2016); Tonsillectomy; Colonoscopy with propofol (N/A, 04/19/2016); and Sinusotomy (02/28/2019). Ryan [name] has a current medication list which includes the following prescription(s): alprazolam, aspirin, atorvastatin, cetirizine, gabapentin, lisinopril, nicotine, proair hfa, risperidone, sildenafil, and tamsulosin. He  reports that he has been smoking cigarettes. He has a 60.00 pack-year smoking history. He uses smokeless tobacco. He reports current alcohol use. He reports that he does not use drugs. Ryan [name] has No Known Allergies.   HPI  Today, he is being contacted for a post-procedure assessment.   Post-Procedure Evaluation  Procedure (10/15/2020):  Type: Lumbar Facet, Medial Branch Block(s) #4 2022 Primary Purpose: Therapeutic Region: Posterolateral Lumbosacral Spine Level: L3, L4, L5,  Medial Branch Level(s). Injecting these levels blocks the L3-4, L4-5  lumbar facet joints. Laterality: Bilateral  Anxiolysis: Please see nurses note.  Effectiveness during initial hour after procedure (Ultra-Short Term Relief): 100 %   Local anesthetic used: Long-acting (4-6 hours) Effectiveness: Defined  as any analgesic benefit obtained secondary to the administration of local anesthetics. This carries significant diagnostic value as to the etiological location, or anatomical origin, of the pain. Duration of benefit is expected to coincide with the duration of the local anesthetic used.  Effectiveness during initial 4-6 hours after procedure (Short-Term Relief): 100 %   Long-term benefit: Defined as any relief past the pharmacologic duration of the local anesthetics.  Effectiveness past the initial 6 hours after procedure (Long-Term Relief): 100 % (great relief for about 3 - 4 weeks.)   Benefits, current: Defined as benefit present at the time of this evaluation.   Analgesia:  45-50%  Function: Back to baseline ROM: Back to baseline     Laboratory Chemistry Profile   Renal Lab Results  Component Value Date   BUN 14 08/02/2020   CREATININE 1.04 08/02/2020   GFRAA >60 09/11/2019   GFRNONAA >60 08/02/2020    Hepatic Lab Results  Component Value Date   AST 19 07/04/2020   ALT 16 07/04/2020   ALBUMIN 4.6 07/04/2020   ALKPHOS 102 07/04/2020   LIPASE 77 (H) 10/19/2017    Electrolytes Lab Results  Component Value Date   NA 141 08/02/2020   K 3.2 (L) 08/02/2020   CL 106 08/02/2020   CALCIUM 8.9 08/02/2020    Bone No results found for: VD25OH, VD125OH2TOT, GG2694WN4, OE7035KK9, 25OHVITD1, 25OHVITD2, 25OHVITD3, TESTOFREE, TESTOSTERONE  Inflammation (CRP: Acute Phase) (ESR: Chronic Phase) No results found for: CRP, ESRSEDRATE, LATICACIDVEN       Note: Above Lab results reviewed.   Assessment  The primary encounter diagnosis was Lumbar spondylosis. Diagnoses of Lumbar facet arthropathy, DDD (degenerative disc disease), lumbosacral, and  Chronic pain syndrome were also pertinent to this visit.  Plan of Care    Ryan [name] has a current medication list which includes the following long-term medication(s): atorvastatin, cetirizine, gabapentin, lisinopril, proair hfa, and risperidone.  1. Lumbar spondylosis -Repeat as above - LUMBAR FACET(MEDIAL BRANCH NERVE BLOCK) MBNB; Future    Orders:  Orders Placed This Encounter  Procedures   LUMBAR FACET(MEDIAL BRANCH NERVE BLOCK) MBNB    Standing Status:   Future    Standing Expiration Date:   12/26/2020    Scheduling Instructions:     Procedure: Lumbar facet block (AKA.: Lumbosacral medial branch nerve block)     Side: Bilateral     Level: L3-4, L4-5, Facets ( L3, L4, L5, Medial Branch Nerves)     Sedation: MODERATE     Timeframe: ASAA    Order Specific Question:   Where will this procedure be performed?    Answer:   [facility]    Follow-up plan:   Return in about 2 weeks (around 12/10/2020) for B/L L3, 4, 5 Fct block , moderate sedation (ECT suite).     Status post bilateral L3, L4, L5, S1 lumbar facet medial branch nerve blocks on 03/20/2018 helped 85% pain relief for approximately 1 year. Status post left L3, L4, L5 RFA on 07/11/2019, Right L3, L4, L5 RFA 07/25/19. Not as helpful as lumbar facet MBBs- repeat PRN, consider SPRINT PNS             Recent Visits Date Type Provider Dept  10/15/20 Procedure visit [name], MD [facility]  09/17/20 Telemedicine [name], MD [facility]  Showing recent visits within past 90 days and meeting all other requirements Today's Visits Date Type Provider Dept  11/26/20 Office Visit [name], MD [facility]  Showing today's visits and meeting all other requirements Future Appointments No visits were found meeting these conditions. Showing future appointments within next 90 days and meeting all other requirements I discussed the assessment and treatment plan with the patient.  The patient was provided an opportunity to ask questions and all were answered. The patient agreed with the plan and demonstrated an understanding of the instructions.  Patient advised to call back or seek an in-person evaluation if the symptoms or condition worsens.  Duration of encounter: 82mnutes.  Note by: BGillis Santa MD Date: 11/26/2020; Time: 2:37 PM

## 2020-11-27 ENCOUNTER — Telehealth: Payer: Self-pay | Admitting: *Deleted

## 2020-11-27 NOTE — Telephone Encounter (Signed)
To close

## 2020-12-03 DIAGNOSIS — Z79899 Other long term (current) drug therapy: Secondary | ICD-10-CM | POA: Diagnosis not present

## 2020-12-10 ENCOUNTER — Ambulatory Visit: Payer: Medicaid Other | Admitting: Family Medicine

## 2020-12-15 ENCOUNTER — Ambulatory Visit
Admission: RE | Admit: 2020-12-15 | Discharge: 2020-12-15 | Disposition: A | Discharge status: Home / Self Care | Payer: Medicare Other | Source: Ambulatory Visit | Attending: Student in an Organized Health Care Education/Training Program | Admitting: Student in an Organized Health Care Education/Training Program

## 2020-12-15 ENCOUNTER — Ambulatory Visit (HOSPITAL_BASED_OUTPATIENT_CLINIC_OR_DEPARTMENT_OTHER): Payer: Medicare Other | Admitting: Student in an Organized Health Care Education/Training Program

## 2020-12-15 ENCOUNTER — Other Ambulatory Visit: Payer: Self-pay

## 2020-12-15 ENCOUNTER — Encounter: Payer: Self-pay | Admitting: Student in an Organized Health Care Education/Training Program

## 2020-12-15 DIAGNOSIS — M47816 Spondylosis without myelopathy or radiculopathy, lumbar region: Secondary | ICD-10-CM | POA: Insufficient documentation

## 2020-12-15 DIAGNOSIS — G894 Chronic pain syndrome: Secondary | ICD-10-CM | POA: Insufficient documentation

## 2020-12-15 MED ORDER — DEXAMETHASONE SODIUM PHOSPHATE 10 MG/ML IJ SOLN
10.0000 mg | Freq: Once | INTRAMUSCULAR | Status: AC
  Administered 2020-12-15: 20 mg

## 2020-12-15 MED ORDER — ROPIVACAINE HCL 2 MG/ML IJ SOLN
INTRAMUSCULAR | Status: AC
  Filled 2020-12-15: qty 20

## 2020-12-15 MED ORDER — DEXAMETHASONE SODIUM PHOSPHATE 10 MG/ML IJ SOLN
10.0000 mg | Freq: Once | INTRAMUSCULAR | Status: AC
  Administered 2020-12-15: 10 mg

## 2020-12-15 MED ORDER — ROPIVACAINE HCL 2 MG/ML IJ SOLN
9.0000 mL | Freq: Once | INTRAMUSCULAR | Status: AC
  Administered 2020-12-15: 9 mL via PERINEURAL

## 2020-12-15 MED ORDER — MIDAZOLAM HCL 2 MG/2ML IJ SOLN
1.0000 mg | INTRAMUSCULAR | Status: DC | PRN
  Administered 2020-12-15: 1 mg via INTRAVENOUS

## 2020-12-15 MED ORDER — IOHEXOL 180 MG/ML  SOLN: 10.0000 mL | Freq: Once | INTRAMUSCULAR | Status: DC

## 2020-12-15 MED ORDER — MIDAZOLAM HCL 5 MG/5ML IJ SOLN
INTRAMUSCULAR | Status: AC
  Filled 2020-12-15: qty 5

## 2020-12-15 MED ORDER — FENTANYL CITRATE (PF) 100 MCG/2ML IJ SOLN
INTRAMUSCULAR | Status: AC
  Filled 2020-12-15: qty 2

## 2020-12-15 MED ORDER — FENTANYL CITRATE (PF) 100 MCG/2ML IJ SOLN
25.0000 ug | INTRAMUSCULAR | Status: DC | PRN
  Administered 2020-12-15: 100 ug via INTRAVENOUS

## 2020-12-15 MED ORDER — DEXAMETHASONE SODIUM PHOSPHATE 10 MG/ML IJ SOLN
INTRAMUSCULAR | Status: AC
  Filled 2020-12-15: qty 2

## 2020-12-15 MED ORDER — LIDOCAINE HCL (PF) 2 % IJ SOLN
INTRAMUSCULAR | Status: AC
  Filled 2020-12-15: qty 20

## 2020-12-15 MED ORDER — LIDOCAINE HCL 2 % IJ SOLN
20.0000 mL | Freq: Once | INTRAMUSCULAR | Status: AC
  Administered 2020-12-15: 100 mg

## 2020-12-15 NOTE — Patient Instructions (Signed)
Pain Management Discharge Instructions  General Discharge Instructions :  If you need to reach your doctor call: Monday-Friday 8:00 am - 4:00 pm at [phone] or toll free [phone].  After clinic hours [phone] to have operator reach doctor.  Bring all of your medication bottles to all your appointments in the pain clinic.  To cancel or reschedule your appointment with Pain Management please remember to call 24 hours in advance to avoid a fee.  Refer to the educational materials which you have been given on: General Risks, I had my Procedure. Discharge Instructions, Post Sedation.  Post Procedure Instructions:  The drugs you were given will stay in your system until tomorrow, so for the next 24 hours you should not drive, make any legal decisions or drink any alcoholic beverages.  You may eat anything you prefer, but it is better to start with liquids then soups and crackers, and gradually work up to solid foods.  Please notify your doctor immediately if you have any unusual bleeding, trouble breathing or pain that is not related to your normal pain.  Depending on the type of procedure that was done, some parts of your body may feel week and/or numb.  This usually clears up by tonight or the next day.  Walk with the use of an assistive device or accompanied by an adult for the 24 hours.  You may use ice on the affected area for the first 24 hours.  Put ice in a Ziploc bag and cover with a towel and place against area 15 minutes on 15 minutes off.  You may switch to heat after 24 hours.Facet Blocks Patient Information  Description: The facets are joints in the spine between the vertebrae.  Like any joints in the body, facets can become irritated and painful.  Arthritis can also effect the facets.  By injecting steroids and local anesthetic in and around these joints, we can temporarily block the nerve supply to them.  Steroids act directly on irritated nerves and tissues to  reduce selling and inflammation which often leads to decreased pain.  Facet blocks may be done anywhere along the spine from the neck to the low back depending upon the location of your pain.   After numbing the skin with local anesthetic (like Novocaine), a Rasmussen needle is passed onto the facet joints under x-ray guidance.  You may experience a sensation of pressure while this is being done.  The entire block usually lasts about 15-25 minutes.   Conditions which may be treated by facet blocks:  Low back/buttock pain Neck/shoulder pain Certain types of headaches  Preparation for the injection:  Do not eat any solid food or dairy products within 8 hours of your appointment. You may drink clear liquid up to 3 hours before appointment.  Clear liquids include water, black coffee, juice or soda.  No milk or cream please. You may take your regular medication, including pain medications, with a sip of water before your appointment.  Diabetics should hold regular insulin (if taken separately) and take 1/2 normal NPH dose the morning of the procedure.  Carry some sugar containing items with you to your appointment. A driver must accompany you and be prepared to drive you home after your procedure. Bring all your current medications with you. An IV may be inserted and sedation may be given at the discretion of the physician. A blood pressure cuff, EKG and other monitors will often be applied during the procedure.  Some patients may need to   have extra oxygen administered for a short period. You will be asked to provide medical information, including your allergies and medications, prior to the procedure.  We must know immediately if you are taking blood thinners (like Coumadin/Warfarin) or if you are allergic to IV iodine contrast (dye).  We must know if you could possible be pregnant.  Possible side-effects:  Bleeding from needle site Infection (rare, may require surgery) Nerve injury (rare) Numbness  & tingling (temporary) Difficulty urinating (rare, temporary) Spinal headache (a headache worse with upright posture) Light-headedness (temporary) Pain at injection site (serveral days) Decreased blood pressure (rare, temporary) Weakness in arm/leg (temporary) Pressure sensation in back/neck (temporary)   Call if you experience:  Fever/chills associated with headache or increased back/neck pain Headache worsened by an upright position New onset, weakness or numbness of an extremity below the injection site Hives or difficulty breathing (go to the emergency room) Inflammation or drainage at the injection site(s) Severe back/neck pain greater than usual New symptoms which are concerning to you  Please note:  Although the local anesthetic injected can often make your back or neck feel good for several hours after the injection, the pain will likely return. It takes 3-7 days for steroids to work.  You may not notice any pain relief for at least one week.  If effective, we will often do a series of 2-3 injections spaced 3-6 weeks apart to maximally decrease your pain.  After the initial series, you may be a candidate for a more permanent nerve block of the facets.  If you have any questions, please call [phone] [facility] 

## 2020-12-15 NOTE — Progress Notes (Signed)
Patient's Name: Ryan [name]  MRN: [id number]  Referring Provider: Edward Jolly, MD  DOB: [birth year]  PCP: Smitty Cords, DO  DOS: 12/15/2020  Note by: Edward Jolly, MD  Service setting: Ambulatory outpatient  Specialty: Interventional Pain Management  Patient type: Established  Location: [facility] (AMB) Pain Management Facility  Visit type: Interventional Procedure   Primary Reason for Visit: Interventional Pain Management Treatment. CC: Back Pain  Procedure:          Anesthesia, Analgesia, Anxiolysis:  Type: Lumbar Facet, Medial Branch Block(s) #5,  2022 Primary Purpose: Therapeutic Region: Posterolateral Lumbosacral Spine Level: L3, L4, L5,  Medial Branch Level(s). Injecting these levels blocks the L3-4, L4-5 lumbar facet joints. Laterality: Bilateral  Type: moderate sedation Route: Intravenous (IV) IV Access: Secured Sedation: Meaningful verbal contact was maintained at all times during the procedure  Local Anesthetic: Lidocaine 1-2%  Position: Prone   Indications: 1. Lumbar spondylosis   2. Lumbar facet arthropathy   3. Chronic pain syndrome    Pain Score: Pre-procedure: 8 /10 Post-procedure: 3 /10  Pre-op Assessment:  Ryan [name] is a 65 y.o. (year old), male patient, seen today for interventional treatment. He  has a past surgical history that includes Eye surgery; Hernia repair; Transforaminal lumbar interbody fusion (tlif) with pedicle screw fixation 1 level (Right, 06/18/2014); Myringotomy with tube placement (Bilateral, 03/28/2015); Back surgery; Esophagogastroduodenoscopy (egd) with propofol (N/A, 10/24/2015); Cataract extraction w/PHACO (Left, 01/06/2016); Tonsillectomy; Colonoscopy with propofol (N/A, 04/19/2016); and Sinusotomy (02/28/2019). Ryan [name] has a current medication list which includes the following prescription(s): alprazolam, aspirin, atorvastatin, cetirizine, gabapentin, lisinopril, nicotine, proair hfa, risperidone, sildenafil, and tamsulosin, and the  following Facility-Administered Medications: fentanyl, iohexol, and midazolam. His primarily concern today is the Back Pain  Initial Vital Signs:  Pulse/HCG Rate: 79ECG Heart Rate: 67 Temp: (!) 97.3 F (36.3 C) Resp: 16 BP: (!) 184/102 SpO2: 100 %  BMI: Estimated body mass index is 26.61 kg/m as calculated from the following:   Height as of this encounter: 5\' 8"  (1.727 m).   Weight as of this encounter: 175 lb (79.4 kg).  Risk Assessment: Allergies: Reviewed. He has No Known Allergies.  Allergy Precautions: None required Coagulopathies: Reviewed. None identified.  Blood-thinner therapy: None at this time Active Infection(s): Reviewed. None identified. Ryan [name] is afebrile  Site Confirmation: Ryan [name] was asked to confirm the procedure and laterality before marking the site Procedure checklist: Completed Consent: Before the procedure and under the influence of no sedative(s), amnesic(s), or anxiolytics, the patient was informed of the treatment options, risks and possible complications. To fulfill our ethical and legal obligations, as recommended by the American Medical Association's Code of Ethics, I have informed the patient of my clinical impression; the nature and purpose of the treatment or procedure; the risks, benefits, and possible complications of the intervention; the alternatives, including doing nothing; the risk(s) and benefit(s) of the alternative treatment(s) or procedure(s); and the risk(s) and benefit(s) of doing nothing. The patient was provided information about the general risks and possible complications associated with the procedure. These may include, but are not limited to: failure to achieve desired goals, infection, bleeding, organ or nerve damage, allergic reactions, paralysis, and death. In addition, the patient was informed of those risks and complications associated to Spine-related procedures, such as failure to decrease pain; infection (i.e.: Meningitis,  epidural or intraspinal abscess); bleeding (i.e.: epidural hematoma, subarachnoid hemorrhage, or any other type of intraspinal or peri-dural bleeding); organ or nerve damage (i.e.: Any type of peripheral nerve,  nerve root, or spinal cord injury) with subsequent damage to sensory, motor, and/or autonomic systems, resulting in permanent pain, numbness, and/or weakness of one or several areas of the body; allergic reactions; (i.e.: anaphylactic reaction); and/or death. Furthermore, the patient was informed of those risks and complications associated with the medications. These include, but are not limited to: allergic reactions (i.e.: anaphylactic or anaphylactoid reaction(s)); adrenal axis suppression; blood sugar elevation that in diabetics may result in ketoacidosis or comma; water retention that in patients with history of congestive heart failure may result in shortness of breath, pulmonary edema, and decompensation with resultant heart failure; weight gain; swelling or edema; medication-induced neural toxicity; particulate matter embolism and blood vessel occlusion with resultant organ, and/or nervous system infarction; and/or aseptic necrosis of one or more joints. Finally, the patient was informed that Medicine is not an exact science; therefore, there is also the possibility of unforeseen or unpredictable risks and/or possible complications that may result in a catastrophic outcome. The patient indicated having understood very clearly. We have given the patient no guarantees and we have made no promises. Enough time was given to the patient to ask questions, all of which were answered to the patient's satisfaction. Ryan [name] has indicated that he wanted to continue with the procedure. Attestation: I, the ordering provider, attest that I have discussed with the patient the benefits, risks, side-effects, alternatives, likelihood of achieving goals, and potential problems during recovery for the procedure that  I have provided informed consent. Date  Time: 12/15/2020  9:06 AM  Pre-Procedure Preparation:  Monitoring: As per clinic protocol. Respiration, ETCO2, SpO2, BP, heart rate and rhythm monitor placed and checked for adequate function Safety Precautions: Patient was assessed for positional comfort and pressure points before starting the procedure. Time-out: I initiated and conducted the "Time-out" before starting the procedure, as per protocol. The patient was asked to participate by confirming the accuracy of the "Time Out" information. Verification of the correct person, site, and procedure were performed and confirmed by me, the nursing staff, and the patient. "Time-out" conducted as per Joint Commission's Universal Protocol (UP.01.01.01). Time: 1000  Description of Procedure:          Laterality: Bilateral. The procedure was performed in identical fashion on both sides. Levels:  L3, L4, L5,  Medial Branch Level(s) Area Prepped: Posterior Lumbosacral Region Prepping solution: ChloraPrep (2% chlorhexidine gluconate and 70% isopropyl alcohol) Safety Precautions: Aspiration looking for blood return was conducted prior to all injections. At no point did we inject any substances, as a needle was being advanced. Before injecting, the patient was told to immediately notify me if he was experiencing any new onset of "ringing in the ears, or metallic taste in the mouth". No attempts were made at seeking any paresthesias. Safe injection practices and needle disposal techniques used. Medications properly checked for expiration dates. SDV (single dose vial) medications used. After the completion of the procedure, all disposable equipment used was discarded in the proper designated medical waste containers. Local Anesthesia: Protocol guidelines were followed. The patient was positioned over the fluoroscopy table. The area was prepped in the usual manner. The time-out was completed. The target area was identified  using fluoroscopy. A 12-in long, straight, sterile hemostat was used with fluoroscopic guidance to locate the targets for each level blocked. Once located, the skin was marked with an approved surgical skin marker. Once all sites were marked, the skin (epidermis, dermis, and hypodermis), as well as deeper tissues (fat, connective tissue and muscle) were  infiltrated with a Viverette amount of a short-acting local anesthetic, loaded on a 10cc syringe with a 25G, 1.5-in  Needle. An appropriate amount of time was allowed for local anesthetics to take effect before proceeding to the next step. Local Anesthetic: Lidocaine 2.0% The unused portion of the local anesthetic was discarded in the proper designated containers. Technical explanation of process:   L3 Medial Branch Nerve Block (MBB): The target area for the L3 medial branch is at the junction of the postero-lateral aspect of the superior articular process and the superior, posterior, and medial edge of the transverse process of L4. Under fluoroscopic guidance, a Quincke needle was inserted until contact was made with os over the superior postero-lateral aspect of the pedicular shadow (target area). After negative aspiration for blood,1 cc  mL of the nerve block solution was injected without difficulty or complication. The needle was removed intact. L4 Medial Branch Nerve Block (MBB): The target area for the L4 medial branch is at the junction of the postero-lateral aspect of the superior articular process and the superior, posterior, and medial edge of the transverse process of L5. Under fluoroscopic guidance, a Quincke needle was inserted until contact was made with os over the superior postero-lateral aspect of the pedicular shadow (target area). After negative aspiration for blood,1cc mL of the nerve block solution was injected without difficulty or complication. The needle was removed intact. L5 Medial Branch Nerve Block (MBB): The target area for the L5  medial branch is at the junction of the postero-lateral aspect of the superior articular process and the superior, posterior, and medial edge of the sacral ala. Under fluoroscopic guidance, a Quincke needle was inserted until contact was made with os over the superior postero-lateral aspect of the pedicular shadow (target area). After negative aspiration for blood,1cc  mL of the nerve block solution was injected without difficulty or complication. The needle was removed intact.   Nerve block solution: 10 cc solution made of 8 cc of 0.2% ropivacaine, 2 cc of Decadron 10 mg/cc.  1-1.5 cc injected at each level above bilaterally  Procedural Needles: 22-gauge, 3.5-inch, Quincke needles used for all levels.  Once the entire procedure was completed, the treated area was cleaned, making sure to leave some of the prepping solution back to take advantage of its long term bactericidal properties.   Illustration of the posterior view of the lumbar spine and the posterior neural structures. Laminae of L2 through S1 are labeled. DPRL5, dorsal primary ramus of L5; DPRS1, dorsal primary ramus of S1; DPR3, dorsal primary ramus of L3; FJ, facet (zygapophyseal) joint L3-L4; I, inferior articular process of L4; LB1, lateral branch of dorsal primary ramus of L1; IAB, inferior articular branches from L3 medial branch (supplies L4-L5 facet joint); IBP, intermediate branch plexus; MB3, medial branch of dorsal primary ramus of L3; NR3, third lumbar nerve root; S, superior articular process of L5; SAB, superior articular branches from L4 (supplies L4-5 facet joint also); TP3, transverse process of L3.  Vitals:   12/15/20 1013 12/15/20 1023 12/15/20 1033 12/15/20 1041  BP: (!) 159/105 (!) 159/99 (!) 177/100 (!) 170/84  Pulse:      Resp: 18 16 18 16   Temp:      SpO2: 98% 100% 100% 100%  Weight:      Height:         Start Time: 1000 hrs. End Time: 1012 hrs.  Imaging Guidance (Spinal):          Type of Imaging  Technique:  Fluoroscopy Guidance (Spinal) Indication(s): Assistance in needle guidance and placement for procedures requiring needle placement in or near specific anatomical locations not easily accessible without such assistance. Exposure Time: Please see nurses notes. Contrast: None used. Fluoroscopic Guidance: I was personally present during the use of fluoroscopy. "Tunnel Vision Technique" used to obtain the best possible view of the target area. Parallax error corrected before commencing the procedure. "Direction-depth-direction" technique used to introduce the needle under continuous pulsed fluoroscopy. Once target was reached, antero-posterior, oblique, and lateral fluoroscopic projection used confirm needle placement in all planes. Images permanently stored in EMR. Interpretation: No contrast injected. I personally interpreted the imaging intraoperatively. Adequate needle placement confirmed in multiple planes. Permanent images saved into the patient's record.   Post-operative Assessment:  Post-procedure Vital Signs:  Pulse/HCG Rate: 7966 Temp: (!) 97.3 F (36.3 C) Resp: 16 BP: (!) 170/84 SpO2: 100 %  EBL: None  Complications: No immediate post-treatment complications observed by team, or reported by patient.  Note: The patient tolerated the entire procedure well. A repeat set of vitals were taken after the procedure and the patient was kept under observation following institutional policy, for this type of procedure. Post-procedural neurological assessment was performed, showing return to baseline, prior to discharge. The patient was provided with post-procedure discharge instructions, including a section on how to identify potential problems. Should any problems arise concerning this procedure, the patient was given instructions to immediately contact us, at any time, without hesitation. In any case, we plan to contact the patient by telephone for a follow-up status report regarding  this interventional procedure.  Comments:  No additional relevant information.  Plan of Care   Imaging Orders         DG PAIN CLINIC C-ARM 1-60 MIN NO REPORT     Medications ordered for procedure: Meds ordered this encounter  Medications   iohexol (OMNIPAQUE) 180 MG/ML injection 10 mL    Must be Myelogram-compatible. If not available, you may substitute with a water-soluble, non-ionic, hypoallergenic, myelogram-compatible radiological contrast medium.   lidocaine (XYLOCAINE) 2 % (with pres) injection 400 mg   fentaNYL (SUBLIMAZE) injection 25-50 mcg    Make sure Narcan is available in the pyxis when using this medication. In the event of respiratory depression (RR< 8/min): Titrate NARCAN (naloxone) in increments of 0.1 to 0.2 mg IV at 2-3 minute intervals, until desired degree of reversal.   midazolam (VERSED) injection 1-2 mg    Make sure Flumazenil is available in the pyxis when using this medication. If oversedation occurs, administer 0.2 mg IV over 15 sec. If after 45 sec no response, administer 0.2 mg again over 1 min; may repeat at 1 min intervals; not to exceed 4 doses (1 mg)   dexamethasone (DECADRON) injection 10 mg   dexamethasone (DECADRON) injection 10 mg   ropivacaine (PF) 2 mg/mL (0.2%) (NAROPIN) injection 9 mL   ropivacaine (PF) 2 mg/mL (0.2%) (NAROPIN) injection 9 mL   Medications administered: We administered lidocaine, fentaNYL, midazolam, dexamethasone, dexamethasone, ropivacaine (PF) 2 mg/mL (0.2%), and ropivacaine (PF) 2 mg/mL (0.2%).  See the medical record for exact dosing, route, and time of administration.  Disposition: Discharge home  Discharge Date & Time: 12/15/2020; 1042 hrs.   Physician-requested Follow-up: Return in about 11 weeks (around 03/02/2021) for Post Procedure Evaluation, virtual.  Future Appointments  Date Time Provider Department Center  02/26/2021  9:40 AM Edward Jolly, MD [facility] None    Primary Care Physician: Smitty Cords, DO Location: [facility] Outpatient Pain Management  Facility Note by: Edward Jolly, MD Date: 12/15/2020; Time: 11:09 AM  Disclaimer:  Medicine is not an Visual merchandiser. The only guarantee in medicine is that nothing is guaranteed. It is important to note that the decision to proceed with this intervention was based on the information collected from the patient. The Data and conclusions were drawn from the patient's questionnaire, the interview, and the physical examination. Because the information was provided in large part by the patient, it cannot be guaranteed that it has not been purposely or unconsciously manipulated. Every effort has been made to obtain as much relevant data as possible for this evaluation. It is important to note that the conclusions that lead to this procedure are derived in large part from the available data. Always take into account that the treatment will also be dependent on availability of resources and existing treatment guidelines, considered by other Pain Management Practitioners as being common knowledge and practice, at the time of the intervention. For Medico-Legal purposes, it is also important to point out that variation in procedural techniques and pharmacological choices are the acceptable norm. The indications, contraindications, technique, and results of the above procedure should only be interpreted and judged by a Board-Certified Interventional Pain Specialist with extensive familiarity and expertise in the same exact procedure and technique.

## 2020-12-15 NOTE — Progress Notes (Signed)
Safety precautions to be maintained throughout the outpatient stay will include: orient to surroundings, keep bed in low position, maintain call bell within reach at all times, provide assistance with transfer out of bed and ambulation.  

## 2020-12-16 ENCOUNTER — Telehealth: Payer: Self-pay

## 2020-12-16 NOTE — Telephone Encounter (Signed)
Post procedure phone call.  Patient states he is doing good.  

## 2020-12-22 ENCOUNTER — Other Ambulatory Visit: Payer: Self-pay | Admitting: Family Medicine

## 2020-12-22 DIAGNOSIS — J432 Centrilobular emphysema: Secondary | ICD-10-CM

## 2020-12-23 ENCOUNTER — Ambulatory Visit: Payer: Self-pay

## 2020-12-23 NOTE — Telephone Encounter (Signed)
Requested Prescriptions  Pending Prescriptions Disp Refills  . VENTOLIN HFA 108 (90 Base) MCG/ACT inhaler [Pharmacy Med Name: Ventolin HFA 108 (90 Base) MCG/ACT Inhalation Aerosol Solution] 18 g 0    Sig: INHALE 2 PUFFS BY MOUTH EVERY 6 HOURS AS NEEDED FOR WHEEZING OR SHORTNESS OF BREATH     Pulmonology:  Beta Agonists Failed - 12/22/2020  9:09 AM      Failed - One inhaler should last at least one month. If the patient is requesting refills earlier, contact the patient to check for uncontrolled symptoms.      Passed - Valid encounter within last 12 months    Recent Outpatient Visits          4 months ago Essential hypertension   [facility] [place], Netta Neat, DO   9 months ago Loose stools   [facility] [place], [place]   11 months ago Pain and swelling of right knee   [facility] [place], Netta Neat, DO   1 year ago Essential hypertension   [facility] [place], Netta Neat, DO
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
## 2020-12-24 NOTE — Telephone Encounter (Signed)
FYI to PCP.  Agree with advice given.

## 2021-01-26 ENCOUNTER — Other Ambulatory Visit: Payer: Self-pay | Admitting: Family Medicine

## 2021-01-26 DIAGNOSIS — J432 Centrilobular emphysema: Secondary | ICD-10-CM

## 2021-01-28 ENCOUNTER — Other Ambulatory Visit: Payer: Self-pay | Admitting: Family Medicine

## 2021-01-28 ENCOUNTER — Encounter: Payer: Self-pay | Admitting: Family Medicine

## 2021-01-28 ENCOUNTER — Other Ambulatory Visit: Payer: Self-pay

## 2021-01-28 ENCOUNTER — Ambulatory Visit (INDEPENDENT_AMBULATORY_CARE_PROVIDER_SITE_OTHER): Payer: Commercial Managed Care - HMO | Admitting: Family Medicine

## 2021-01-28 VITALS — BP 180/90 | HR 76 | Ht 68.0 in | Wt 170.0 lb

## 2021-01-28 DIAGNOSIS — Z136 Encounter for screening for cardiovascular disorders: Secondary | ICD-10-CM | POA: Diagnosis not present

## 2021-01-28 DIAGNOSIS — Z23 Encounter for immunization: Secondary | ICD-10-CM | POA: Diagnosis not present

## 2021-01-28 DIAGNOSIS — R413 Other amnesia: Secondary | ICD-10-CM

## 2021-01-28 DIAGNOSIS — I10 Essential (primary) hypertension: Secondary | ICD-10-CM

## 2021-01-28 DIAGNOSIS — R7989 Other specified abnormal findings of blood chemistry: Secondary | ICD-10-CM

## 2021-01-28 DIAGNOSIS — E538 Deficiency of other specified B group vitamins: Secondary | ICD-10-CM

## 2021-01-28 DIAGNOSIS — Z1159 Encounter for screening for other viral diseases: Secondary | ICD-10-CM

## 2021-01-28 DIAGNOSIS — R7309 Other abnormal glucose: Secondary | ICD-10-CM

## 2021-01-28 DIAGNOSIS — Z0000 Encounter for general adult medical examination without abnormal findings: Secondary | ICD-10-CM

## 2021-01-28 DIAGNOSIS — Z122 Encounter for screening for malignant neoplasm of respiratory organs: Secondary | ICD-10-CM

## 2021-01-28 NOTE — Addendum Note (Signed)
Addended by: Olin Hauser on: 01/28/2021 01:41 PM   Modules accepted: Orders

## 2021-01-28 NOTE — Progress Notes (Signed)
Patient: Ryan [name], Male    DOB: Jun 27, 1955, 66 y.o.   MRN: [id number]  Visit Date: 01/28/2021  Today's Provider: Nobie Putnam, DO   Chief Complaint  Patient presents with   Annual Exam    Subjective:   NADIM ANDRETTA is a 66 y.o. male who presents today for "Welcome to Medicare Visit"  Welcome to [employer] Wellness Visit:   HPI  He is interested in vaccination today with Pneumonia vaccine due age 49+ Prevnar 12 PCV 51 today.  He is due for screening imaging, with AAA [place] due age 58 + active smoker, previous results 2017 normal range.  He is due for LDCT Lung CA Screening.   Chart reviewed and updated. Psychosocial and medical history were reviewed. Active issues reviewed and addressed as documented below.   Review of Systems  See above  Past Medical History:  Diagnosis Date   Anxiety    Arthritis    left hand   Asthma    Chronic kidney disease    HAS HAD KIDNEY STONE 2015-- JUST PASSED   COPD (chronic obstructive pulmonary disease) (HCC)    Dyspnea    GERD (gastroesophageal reflux disease)    TAKES TUMS FOR RELIEF   Hyperlipidemia    Hypertension    Improved, No longer on meds   Lower urinary tract symptoms (LUTS)    Prostate disease    Stroke [facility])    Thyroid disease    Wears dentures    hass full upper plate, not wearing, broken   Wears hearing aid     Past Surgical History:  Procedure Laterality Date   BACK SURGERY     CATARACT EXTRACTION W/PHACO Left 01/06/2016   Procedure: CATARACT EXTRACTION PHACO AND INTRAOCULAR LENS PLACEMENT ([place]);  Surgeon: Eulogio Bear, MD;  Location: [place];  Service: Ophthalmology;  Laterality: Left;  LEFT   COLONOSCOPY WITH PROPOFOL N/A 04/19/2016   Procedure: COLONOSCOPY WITH PROPOFOL;  Surgeon: Manya Silvas, MD;  Location: [facility] ENDOSCOPY;  Service: Endoscopy;  Laterality: N/A;   ESOPHAGOGASTRODUODENOSCOPY (EGD) WITH PROPOFOL N/A 10/24/2015   Procedure:  ESOPHAGOGASTRODUODENOSCOPY (EGD) WITH PROPOFOL;  Surgeon: Manya Silvas, MD;  Location: [facility] ENDOSCOPY;  Service: Endoscopy;  Laterality: N/A;   EYE SURGERY     HERNIA REPAIR     ERRONEOUS UMBILICAL HERNIA   [id number]   MYRINGOTOMY WITH TUBE PLACEMENT Bilateral 03/28/2015   Procedure: MYRINGOTOMY WITH TUBE PLACEMENT;  Surgeon: Beverly Gust, MD;  Location: [place];  Service: ENT;  Laterality: Bilateral;  BUTTERFLY TUBE   SINUSOTOMY  02/28/2019   TONSILLECTOMY     TRANSFORAMINAL LUMBAR INTERBODY FUSION (TLIF) WITH PEDICLE SCREW FIXATION 1 LEVEL Right 06/18/2014   Procedure: TRANSFORAMINAL LUMBAR INTERBODY FUSION (TLIF) WITH PEDICLE SCREW FIXATION 1 LEVEL LUMBAR 5 -SACRAL 1;  Surgeon: Karie Chimera, MD;  Location: [place] NEURO ORS;  Service: Neurosurgery;  Laterality: Right;  Right transforaminal lumbar interbody fusion with interbody prosthesis and percutaneous pedicle screws Lumbar 5 to Sacral 1    Family History  Problem Relation Age of Onset   Arthritis Mother    Hypertension Mother    Diabetes Father    Hypertension Father     Social History   Socioeconomic History   Marital status: Married    Spouse name: Not on file   Number of children: Not on file   Years of education: Not on file   Highest education level: Not on file  Occupational History   Not on file  Tobacco Use  Smoking status: Every Day    Packs/day: 1.50    Years: 40.00    Pack years: 60.00    Types: Cigarettes   Smokeless tobacco: Current   Tobacco comments:    has cut back to 1PPD  Vaping Use   Vaping Use: Never used  Substance and Sexual Activity   Alcohol use: Yes    Alcohol/week: 0.0 standard drinks    Comment: ONCE OR TWICE A YR   Drug use: No   Sexual activity: Not on file  Other Topics Concern   Not on file  Social History Narrative   Not on file   Social Determinants of Health   Financial Resource Strain: Not on file  Food Insecurity: Not on file   Transportation Needs: Not on file  Physical Activity: Not on file  Stress: Not on file  Social Connections: Not on file  Intimate Partner Violence: Not on file    Outpatient Encounter Medications as of 01/28/2021  Medication Sig   ALPRAZolam (XANAX) 1 MG tablet Take 1 mg by mouth 2 (two) times daily as needed.   aspirin EC 81 MG EC tablet Take 1 tablet (81 mg total) by mouth daily. Swallow whole.   atorvastatin (LIPITOR) 80 MG tablet Take 1 tablet (80 mg total) by mouth at bedtime.   cetirizine (ZYRTEC) 10 MG tablet Take 10 mg by mouth daily.   gabapentin (NEURONTIN) 400 MG capsule Take 400 mg by mouth in the morning and at bedtime. 400 mg twice a day as per patient --psychiatrist changed that Normally takes 400 mg daily   lisinopril (ZESTRIL) 40 MG tablet Take 1 tablet (40 mg total) by mouth daily.   nicotine (NICODERM CQ - DOSED IN MG/24 HOURS) 21 mg/24hr patch Place 1 patch (21 mg total) onto the skin daily.   risperiDONE (RISPERDAL) 1 MG tablet Take 1 mg by mouth at bedtime.   sildenafil (REVATIO) 20 MG tablet Take 20-100 mg by mouth as needed (ED).    tamsulosin (FLOMAX) 0.4 MG CAPS capsule Take 1 capsule (0.4 mg total) by mouth in the morning and at bedtime.   VENTOLIN HFA 108 (90 Base) MCG/ACT inhaler INHALE 2 PUFFS BY MOUTH EVERY 6 HOURS AS NEEDED FOR WHEEZING FOR SHORTNESS OF BREATH   No facility-administered encounter medications on file as of 01/28/2021.      Functional Status Survey:    Functional Ability / Safety Screening 1.  Was the timed Get Up and Go test longer than 30 seconds?  no 2.  Does the patient need help with the phone, transportation, shopping,      preparing meals, housework, laundry, medications, or managing money?  no 3.  Does the patient's home have:  loose throw rugs in the hallway?   no      Grab bars in the bathroom? yes      Handrails on the stairs?   yes      Poor lighting?   no 4.  Has the patient noticed any hearing difficulties?    yes - difficulty with after stroke, he has apt with ENT Audiology in 1 month. New hearing aid.  Fall Risk Assessment Fall Risk  01/28/2021 10/15/2020 07/23/2020 04/23/2020 01/24/2020  Falls in the past year? 0 0 0 0 0  Number falls in past yr: - - 0 0 0  Injury with Fall? - - - - 0  Risk for fall due to : - - No Fall Risks No Fall Risks -  Risk for fall  due to: Comment - - - - -  Follow up - - Falls evaluation completed Falls evaluation completed Falls evaluation completed    Depression Screen See under rooming Depression screen [facility] 2/9 01/28/2021 01/24/2020 10/30/2019 10/23/2019 07/25/2019  Decreased Interest 0 0 0 0 0  Down, Depressed, Hopeless 0 0 0 1 0  PHQ - 2 Score 0 0 0 1 0  Altered sleeping - - - 0 -  Tired, decreased energy - - - 0 -  Change in appetite - - - 0 -  Feeling bad or failure about yourself  - - - 0 -  Trouble concentrating - - - 0 -  Moving slowly or fidgety/restless - - - 0 -  Suicidal thoughts - - - 0 -  PHQ-9 Score - - - 1 -  Difficult doing work/chores - - - Not difficult at all -    Advanced Directives Does patient have a HCPOA?    no If yes, name and contact information:  Does patient have a living will or MOST form?   N/A  Objective:   Vitals: BP (!) 180/90    Pulse 76    Ht 5\' 8"  (1.727 m)    Wt 170 lb (77.1 kg)    SpO2 100%    BMI 25.85 kg/m  Body mass index is 25.85 kg/m.  Filed Weights   01/28/21 1325  Weight: 170 lb (77.1 kg)    No results found.  Physical Exam  No flowsheet data found.  No flowsheet data found.  Cognitive Testing - 6-CIT No flowsheet data found.   Correct? Score   What year is it? yes 0 Yes = 0    No = 4  What month is it? yes 0 Yes = 0    No = 3  Remember:     [place]ia [place], [place], [place]     What time is it? yes 0 Yes = 0    No = 3  Count backwards from 20 to 1 yes 0 Correct = 0    1 error = 2   More than 1 error = 4  Say the months of the year in reverse. yes 0 Correct = 0    1 error = 2   More than 1  error = 4  What address did I ask you to remember? no 4 Correct = 0  1 error = 2    2 error = 4    3 error = 6    4 error = 8    All wrong = 10       TOTAL SCORE  4/28   Interpretation:  Normal  Normal (0-7) Abnormal (8-28)    Assessment & Plan:    Annual Wellness Visit  Reviewed patient's Family Medical History Reviewed and updated list of patient's medical providers Assessment of cognitive impairment was done Assessed patient's functional ability Established a written schedule for health screening [place] Completed and Reviewed  Exercise Activities and Dietary recommendations  Goals   None     Immunization History  Administered Date(s) Administered   Influenza,inj,Quad PF,6+ Mos 10/23/2019   Influenza-Unspecified 11/08/2020   Moderna Sars-Covid-2 Vaccination 02/28/2019, 03/28/2019   PNEUMOCOCCAL CONJUGATE-20 01/28/2021   Tdap 04/25/2017    Health Maintenance  Topic Date Due   Hepatitis C Screening  Never done   Zoster Vaccines- Shingrix (1 of 2) Never done   COVID-19 Vaccine (3 - Booster for Moderna series) 05/23/2019  COLONOSCOPY (Pts 45-12yrs Insurance coverage will need to be confirmed)  04/20/2026   TETANUS/TDAP  04/26/2027   Pneumonia Vaccine 32+ Years old  Completed   INFLUENZA VACCINE  Completed   HIV Screening  Completed   HPV VACCINES  Aged Out    Discussed health benefits of physical activity, and encouraged him to engage in regular exercise appropriate for his age and condition.   No orders of the defined types were placed in this encounter.   Current Outpatient Medications:    ALPRAZolam (XANAX) 1 MG tablet, Take 1 mg by mouth 2 (two) times daily as needed., Disp: , Rfl:    aspirin EC 81 MG EC tablet, Take 1 tablet (81 mg total) by mouth daily. Swallow whole., Disp: 30 tablet, Rfl: 11   atorvastatin (LIPITOR) 80 MG tablet, Take 1 tablet (80 mg total) by mouth at bedtime., Disp: 90 tablet, Rfl: 3    cetirizine (ZYRTEC) 10 MG tablet, Take 10 mg by mouth daily., Disp: , Rfl:    gabapentin (NEURONTIN) 400 MG capsule, Take 400 mg by mouth in the morning and at bedtime. 400 mg twice a day as per patient --psychiatrist changed that Normally takes 400 mg daily, Disp: , Rfl:    lisinopril (ZESTRIL) 40 MG tablet, Take 1 tablet (40 mg total) by mouth daily., Disp: 90 tablet, Rfl: 1   nicotine (NICODERM CQ - DOSED IN MG/24 HOURS) 21 mg/24hr patch, Place 1 patch (21 mg total) onto the skin daily., Disp: 28 patch, Rfl: 2   risperiDONE (RISPERDAL) 1 MG tablet, Take 1 mg by mouth at bedtime., Disp: , Rfl:    sildenafil (REVATIO) 20 MG tablet, Take 20-100 mg by mouth as needed (ED). , Disp: , Rfl:    tamsulosin (FLOMAX) 0.4 MG CAPS capsule, Take 1 capsule (0.4 mg total) by mouth in the morning and at bedtime., Disp: 180 capsule, Rfl: 1   VENTOLIN HFA 108 (90 Base) MCG/ACT inhaler, INHALE 2 PUFFS BY MOUTH EVERY 6 HOURS AS NEEDED FOR WHEEZING FOR SHORTNESS OF BREATH, Disp: 18 g, Rfl: 2 There are no discontinued medications.  Next Medicare Wellness Visit in 12+ months   Nobie Putnam, [facility] 01/28/2021, 1:32 PM

## 2021-01-28 NOTE — Patient Instructions (Addendum)
°  Lung CT screening for Cancer to be scheduled.  Ultrasound abdomen for aorta aneurysm screening to be scheduled.  Pneumonia vaccine today.  DUE for FASTING BLOOD WORK (no food or drink after midnight before the lab appointment, only water or coffee without cream/sugar on the morning of)  SCHEDULE "Lab Only" visit in the morning at the clinic for lab draw in 1 WEEK  - Make sure Lab Only appointment is at about 1 week before your next appointment, so that results will be available  For Lab Results, once available within 2-3 days of blood draw, you can can log in to MyChart online to view your results and a brief explanation. Also, we can discuss results at next follow-up visit.   Please schedule a Follow-up Appointment to: Return in about 1 year (around 01/28/2022) for 1 week fasting lab from today - then 1 year for annual medicare wellness with Nurse Health Advisor.  If you have any other questions or concerns, please feel free to call the office or send a message through MyChart. You may also schedule an earlier appointment if necessary.  Additionally, you may be receiving a survey about your experience at our office within a few days to 1 week by e-mail or mail. We value your feedback.  Saralyn Pilar, DO [facility], [place]

## 2021-01-28 NOTE — Telephone Encounter (Signed)
Requested Prescriptions  Pending Prescriptions Disp Refills   VENTOLIN HFA 108 (90 Base) MCG/ACT inhaler [Pharmacy Med Name: Ventolin HFA 108 (90 Base) MCG/ACT Inhalation Aerosol Solution] 18 g 0    Sig: INHALE 2 PUFFS BY MOUTH EVERY 6 HOURS AS NEEDED FOR WHEEZING FOR SHORTNESS OF BREATH     Pulmonology:  Beta Agonists Failed - 01/26/2021 11:54 AM      Failed - One inhaler should last at least one month. If the patient is requesting refills earlier, contact the patient to check for uncontrolled symptoms.      Passed - Valid encounter within last 12 months    Recent Outpatient Visits          5 months ago Essential hypertension   [facility] Smitty Cords, DO   11 months ago Loose stools   [facility] Caro Laroche, DO   1 year ago Pain and swelling of right knee   [facility] Smitty Cords, DO   1 year ago Essential hypertension   [facility], Netta Neat, DO

## 2021-01-30 ENCOUNTER — Other Ambulatory Visit: Payer: Self-pay

## 2021-01-30 DIAGNOSIS — R7989 Other specified abnormal findings of blood chemistry: Secondary | ICD-10-CM

## 2021-01-30 DIAGNOSIS — E538 Deficiency of other specified B group vitamins: Secondary | ICD-10-CM

## 2021-01-30 DIAGNOSIS — I10 Essential (primary) hypertension: Secondary | ICD-10-CM

## 2021-01-30 DIAGNOSIS — R7309 Other abnormal glucose: Secondary | ICD-10-CM

## 2021-01-30 DIAGNOSIS — Z1159 Encounter for screening for other viral diseases: Secondary | ICD-10-CM

## 2021-01-30 DIAGNOSIS — R413 Other amnesia: Secondary | ICD-10-CM

## 2021-02-02 ENCOUNTER — Other Ambulatory Visit: Payer: Medicare Other

## 2021-02-02 DIAGNOSIS — E538 Deficiency of other specified B group vitamins: Secondary | ICD-10-CM | POA: Diagnosis not present

## 2021-02-02 DIAGNOSIS — R7989 Other specified abnormal findings of blood chemistry: Secondary | ICD-10-CM | POA: Diagnosis not present

## 2021-02-02 DIAGNOSIS — R413 Other amnesia: Secondary | ICD-10-CM | POA: Diagnosis not present

## 2021-02-02 DIAGNOSIS — I10 Essential (primary) hypertension: Secondary | ICD-10-CM | POA: Diagnosis not present

## 2021-02-02 DIAGNOSIS — Z1159 Encounter for screening for other viral diseases: Secondary | ICD-10-CM | POA: Diagnosis not present

## 2021-02-03 LAB — COMPLETE METABOLIC PANEL WITH GFR
AG Ratio: 1.8 (calc) (ref 1.0–2.5)
ALT: 11 U/L (ref 9–46)
AST: 14 U/L (ref 10–35)
Albumin: 4.2 g/dL (ref 3.6–5.1)
Alkaline phosphatase (APISO): 121 U/L (ref 35–144)
BUN: 19 mg/dL (ref 7–25)
CO2: 25 mmol/L (ref 20–32)
Calcium: 8.9 mg/dL (ref 8.6–10.3)
Chloride: 103 mmol/L (ref 98–110)
Creat: 0.9 mg/dL (ref 0.70–1.35)
Globulin: 2.4 g/dL (calc) (ref 1.9–3.7)
Glucose, Bld: 67 mg/dL (ref 65–139)
Potassium: 4.2 mmol/L (ref 3.5–5.3)
Sodium: 139 mmol/L (ref 135–146)
Total Bilirubin: 0.3 mg/dL (ref 0.2–1.2)
Total Protein: 6.6 g/dL (ref 6.1–8.1)
eGFR: 95 mL/min/{1.73_m2} (ref >=60)

## 2021-02-03 LAB — CBC WITH DIFFERENTIAL/PLATELET
Absolute Monocytes: 718 cells/uL (ref 200–950)
Basophils Absolute: 58 cells/uL (ref 0–200)
Basophils Relative: 0.6 %
Eosinophils Absolute: 592 cells/uL — ABNORMAL HIGH (ref 15–500)
Eosinophils Relative: 6.1 %
HCT: 48.3 % (ref 38.5–50.0)
Hemoglobin: 16.3 g/dL (ref 13.2–17.1)
Lymphs Abs: 2803 cells/uL (ref 850–3900)
MCH: 32.9 pg (ref 27.0–33.0)
MCHC: 33.7 g/dL (ref 32.0–36.0)
MCV: 97.4 fL (ref 80.0–100.0)
MPV: 10 fL (ref 7.5–12.5)
Monocytes Relative: 7.4 %
Neutro Abs: 5529 cells/uL (ref 1500–7800)
Neutrophils Relative %: 57 %
Platelets: 210 10*3/uL (ref 140–400)
RBC: 4.96 10*6/uL (ref 4.20–5.80)
RDW: 12.8 % (ref 11.0–15.0)
Total Lymphocyte: 28.9 %
WBC: 9.7 10*3/uL (ref 3.8–10.8)

## 2021-02-03 LAB — HEMOGLOBIN A1C
Hgb A1c MFr Bld: 5.6 % of total Hgb (ref <5.7)
Mean Plasma Glucose: 114 mg/dL
eAG (mmol/L): 6.3 mmol/L

## 2021-02-03 LAB — VITAMIN B12: Vitamin B-12: 364 pg/mL (ref 200–1100)

## 2021-02-03 LAB — TESTOSTERONE: Testosterone: 473 ng/dL (ref 250–827)

## 2021-02-03 LAB — TSH: TSH: 1.73 mIU/L (ref 0.40–4.50)

## 2021-02-03 LAB — HEPATITIS C ANTIBODY
Hepatitis C Ab: NONREACTIVE
SIGNAL TO CUT-OFF: 0.08 (ref <1.00)

## 2021-02-12 ENCOUNTER — Ambulatory Visit: Payer: Medicare Other

## 2021-02-16 ENCOUNTER — Other Ambulatory Visit: Payer: Self-pay

## 2021-02-16 ENCOUNTER — Ambulatory Visit
Admission: RE | Admit: 2021-02-16 | Discharge: 2021-02-16 | Disposition: A | Discharge status: Home / Self Care | Payer: Medicare Other | Source: Ambulatory Visit | Attending: Family Medicine | Admitting: Family Medicine

## 2021-02-16 ENCOUNTER — Telehealth: Payer: Self-pay | Admitting: Family Medicine

## 2021-02-16 DIAGNOSIS — Z136 Encounter for screening for cardiovascular disorders: Secondary | ICD-10-CM | POA: Insufficient documentation

## 2021-02-16 DIAGNOSIS — Z87891 Personal history of nicotine dependence: Secondary | ICD-10-CM | POA: Diagnosis not present

## 2021-02-16 NOTE — Telephone Encounter (Signed)
Last visit we ordered an Abdominal Ultrasound to screen / evaluate for Abdominal Aortic Aneurysm. This is a routine test done for anyone age 66 and older who smokes to check if they have an aneurysm.  Results are not in yet.  Ryan [name], [facility] 02/16/2021, 3:42 PM

## 2021-02-16 NOTE — Telephone Encounter (Signed)
Pts wife is calling because the pt went for an ultrasound today. And she would like to know why the patient had an [place]. CB- [phone]

## 2021-02-16 NOTE — Telephone Encounter (Signed)
I explained the procedure and the reasoning. All questions were answered and she had no further concerns at this time.

## 2021-02-26 ENCOUNTER — Other Ambulatory Visit: Payer: Self-pay

## 2021-02-26 ENCOUNTER — Ambulatory Visit
Payer: Medicare Other | Attending: Student in an Organized Health Care Education/Training Program | Admitting: Student in an Organized Health Care Education/Training Program

## 2021-02-26 ENCOUNTER — Encounter: Payer: Self-pay | Admitting: Student in an Organized Health Care Education/Training Program

## 2021-02-26 DIAGNOSIS — M47816 Spondylosis without myelopathy or radiculopathy, lumbar region: Secondary | ICD-10-CM | POA: Diagnosis not present

## 2021-02-26 DIAGNOSIS — G894 Chronic pain syndrome: Secondary | ICD-10-CM | POA: Diagnosis not present

## 2021-02-26 DIAGNOSIS — M5137 Other intervertebral disc degeneration, lumbosacral region: Secondary | ICD-10-CM | POA: Diagnosis not present

## 2021-02-26 DIAGNOSIS — M48062 Spinal stenosis, lumbar region with neurogenic claudication: Secondary | ICD-10-CM | POA: Diagnosis not present

## 2021-02-26 NOTE — Progress Notes (Signed)
Patient: Ryan [name]  Service Category: E/M  Provider: [name], MD  DOB: Jul 08, 1955  DOS: 02/26/2021  Location: Office  MRN: [id number]  Setting: Ambulatory outpatient  Referring Provider: Nobie Putnam *  Type: Established Patient  Specialty: Interventional Pain Management  PCP: Olin Hauser, DO  Location: Remote location  Delivery: TeleHealth     Virtual Encounter - Pain Management PROVIDER NOTE: Information contained herein reflects review and annotations entered in association with encounter. Interpretation of such information and data should be left to medically-trained personnel. Information provided to patient can be located elsewhere in the medical record under "Patient Instructions". Document created using STT-dictation technology, any transcriptional errors that may result from process are unintentional.    Contact & Pharmacy Preferred: [phone] Home: [phone] (home) Mobile: [phone] (mobile) E-mail: [email or web address]  [place] ([address]), [place] [address] [place] [address] Phone: [phone] Fax: [phone]   Pre-screening  Ryan [name] offered "in-person" vs "virtual" encounter. He indicated preferring virtual for this encounter.   Reason COVID-19*   Social distancing based on CDC and AMA recommendations.   I contacted Ryan [name] on 02/26/2021 via telephone.      I clearly identified myself as [name], MD. I verified that I was speaking with the correct person using two identifiers (Name: Ryan [name], and date of birth: October 20, 1955).  Consent I sought verbal advanced consent from Ryan [name] for virtual visit interactions. I informed Ryan [name] of possible security and privacy concerns, risks, and limitations associated with providing "not-in-person" medical evaluation and management services. I also informed Ryan [name] of the availability of "in-person"  appointments. Finally, I informed him that there would be a charge for the virtual visit and that he could be  personally, fully or partially, financially responsible for it. Ryan [name] expressed understanding and agreed to proceed.   Historic Elements   Ryan [name] is a 66 y.o. year old, male patient evaluated today after our last contact on 12/15/2020. Ryan [name]  has a past medical history of Anxiety, Arthritis, Asthma, Chronic kidney disease, COPD (chronic obstructive pulmonary disease) ([place]), Dyspnea, GERD (gastroesophageal reflux disease), Hyperlipidemia, Hypertension, Lower urinary tract symptoms (LUTS), Prostate disease, Stroke [facility] Thyroid disease, Wears dentures, and Wears hearing aid. He also  has a past surgical history that includes Eye surgery; Hernia repair; Transforaminal lumbar interbody fusion (tlif) with pedicle screw fixation 1 level (Right, 06/18/2014); Myringotomy with tube placement (Bilateral, 03/28/2015); Back surgery; Esophagogastroduodenoscopy (egd) with propofol (N/A, 10/24/2015); Cataract extraction w/PHACO (Left, 01/06/2016); Tonsillectomy; Colonoscopy with propofol (N/A, 04/19/2016); and Sinusotomy (02/28/2019). Ryan [name] has a current medication list which includes the following prescription(s): alprazolam, aspirin, atorvastatin, cetirizine, gabapentin, lisinopril, nicotine, risperidone, sildenafil, tamsulosin, and ventolin hfa. He  reports that he has been smoking cigarettes. He has a 60.00 pack-year smoking history. He uses smokeless tobacco. He reports current alcohol use. He reports that he does not use drugs. Ryan [name] has no active allergies.   HPI  Today, he is being contacted for a post-procedure assessment.   Post-procedure evaluation   Type: Lumbar Facet, Medial Branch Block(s) #5,  2022 Primary Purpose: Therapeutic Region: Posterolateral Lumbosacral Spine Level: L3, L4, L5,  Medial Branch Level(s). Injecting these levels blocks the L3-4, L4-5 lumbar facet  joints. Laterality: Bilateral  Effectiveness:  Initial hour after procedure: 100 %  Subsequent 4-6 hours post-procedure: 100 %  Analgesia past initial 6 hours: 100 % 100% for 10 weeks  now with gradual return of lower back pain Ongoing improvement:  Analgesic: Currently 30 to 35% improvement states that pain is returning & he would like to repeat Function: Ryan [name] reports improvement in function ROM: Ryan [name] reports improvement in ROM   Laboratory Chemistry Profile   Renal Lab Results  Component Value Date   BUN 19 02/02/2021   CREATININE 0.90 15/17/6160   BCR NOT APPLICABLE 73/71/0626   GFRAA >60 09/11/2019   GFRNONAA >60 08/02/2020    Hepatic Lab Results  Component Value Date   AST 14 02/02/2021   ALT 11 02/02/2021   ALBUMIN 4.6 07/04/2020   ALKPHOS 102 07/04/2020   LIPASE 77 (H) 10/19/2017    Electrolytes Lab Results  Component Value Date   NA 139 02/02/2021   K 4.2 02/02/2021   CL 103 02/02/2021   CALCIUM 8.9 02/02/2021    Bone Lab Results  Component Value Date   TESTOSTERONE 473 02/02/2021    Inflammation (CRP: Acute Phase) (ESR: Chronic Phase) No results found for: CRP, ESRSEDRATE, LATICACIDVEN       Note: Above Lab results reviewed.  Imaging  US AORTA DUPLEX LIMITED CLINICAL DATA:  AAA screening.  EXAM: ULTRASOUND OF ABDOMINAL AORTA  TECHNIQUE: Ultrasound examination of the abdominal aorta and proximal common iliac arteries was performed to evaluate for aneurysm. Additional color and Doppler images of the distal aorta were obtained to document patency.  COMPARISON:  None.  FINDINGS: Abdominal aortic measurements as follows:  Proximal:  The suprarenal abdominal aorta measures 3.0 x 2.9 cm  Mid:  2.2 x 2.4 cm cm  Distal:  1.8 x 1.9 cm cm Patent: Yes, peak systolic velocity is 94.8 cm/s  Right common iliac artery: 1.2 x 1.2 cm cm  Left common iliac artery: 1.2 x 1.1 cm cm  IMPRESSION: 1. No abdominal aortic  aneurysm.  Electronically Signed   By: Dorise Bullion III M.D.   On: 02/16/2021 18:06  Assessment  The primary encounter diagnosis was Lumbar spondylosis. Diagnoses of Lumbar facet arthropathy, DDD (degenerative disc disease), lumbosacral, Spinal stenosis, lumbar region, with neurogenic claudication, and Chronic pain syndrome were also pertinent to this visit.  Plan of Care    Ryan [name] has a current medication list which includes the following long-term medication(s): atorvastatin, cetirizine, gabapentin, lisinopril, risperidone, and ventolin hfa.  Orders:  Orders Placed This Encounter  Procedures   LUMBAR FACET(MEDIAL BRANCH NERVE BLOCK) MBNB    Standing Status:   Future    Standing Expiration Date:   03/26/2021    Scheduling Instructions:     Procedure: Lumbar facet block (AKA.: Lumbosacral medial branch nerve block)     Side: Bilateral     Level: L3-4, L5-S1 Facets (L3, L4, L5, Medial Branch Nerves)     Sedation: with     Timeframe: ASAA    Order Specific Question:   Where will this procedure be performed?    Answer:   [facility]   Follow-up plan:   Return in about 6 days (around 03/04/2021) for B/L L3, 4, 5 Fcts, ECT.     Status post bilateral L3, L4, L5, S1 lumbar facet medial branch nerve blocks on 03/20/2018 helped 85% pain relief for approximately 1 year. Status post left L3, L4, L5 RFA on 07/11/2019, Right L3, L4, L5 RFA 07/25/19. Not as helpful as lumbar facet MBBs- repeat PRN, consider SPRINT PNS              Recent Visits Date Type Provider  Dept  12/15/20 Procedure visit [name], MD [facility]  Showing recent visits within past 90 days and meeting all other requirements Today's Visits Date Type Provider Dept  02/26/21 Office Visit [name], MD [facility]  Showing today's visits and meeting all other requirements Future Appointments No visits were found meeting these conditions. Showing future appointments within next  90 days and meeting all other requirements  I discussed the assessment and treatment plan with the patient. The patient was provided an opportunity to ask questions and all were answered. The patient agreed with the plan and demonstrated an understanding of the instructions.  Patient advised to call back or seek an in-person evaluation if the symptoms or condition worsens.  Duration of encounter: 11mnutes.  Note by: BGillis Santa MD Date: 02/26/2021; Time: 10:36 AM

## 2021-02-27 DIAGNOSIS — Z79899 Other long term (current) drug therapy: Secondary | ICD-10-CM | POA: Diagnosis not present

## 2021-02-28 ENCOUNTER — Emergency Department: Payer: Medicare Other

## 2021-02-28 ENCOUNTER — Encounter: Payer: Self-pay | Admitting: Intensive Care

## 2021-02-28 ENCOUNTER — Emergency Department
Admission: EM | Admit: 2021-02-28 | Discharge: 2021-02-28 | Disposition: A | Discharge status: Home / Self Care | Payer: Medicare Other | Attending: Emergency Medicine | Admitting: Emergency Medicine

## 2021-02-28 ENCOUNTER — Other Ambulatory Visit: Payer: Self-pay

## 2021-02-28 DIAGNOSIS — T798XXA Other early complications of trauma, initial encounter: Secondary | ICD-10-CM | POA: Insufficient documentation

## 2021-02-28 DIAGNOSIS — T148XXA Other injury of unspecified body region, initial encounter: Secondary | ICD-10-CM

## 2021-02-28 DIAGNOSIS — J45909 Unspecified asthma, uncomplicated: Secondary | ICD-10-CM | POA: Insufficient documentation

## 2021-02-28 DIAGNOSIS — L089 Local infection of the skin and subcutaneous tissue, unspecified: Secondary | ICD-10-CM

## 2021-02-28 MED ORDER — CEPHALEXIN 500 MG PO CAPS: 500.0000 mg | ORAL_CAPSULE | Freq: Three times a day (TID) | ORAL | 0 refills | Status: AC

## 2021-02-28 MED ORDER — SULFAMETHOXAZOLE-TRIMETHOPRIM 800-160 MG PO TABS: 1.0000 | ORAL_TABLET | Freq: Two times a day (BID) | ORAL | 0 refills | Status: AC

## 2021-02-28 NOTE — ED Triage Notes (Addendum)
Patient has redness around scabs on right hand. Reports getting cut about 2-3 months ago. No drainage noted at this time but patient reports brown and green drainage from site intermittently

## 2021-02-28 NOTE — Discharge Instructions (Signed)
Please follow-up with your primary care for wound check in about 1 week or sooner if you think you are getting worse.  Return to the emergency department for concerns if unable to schedule an appointment.

## 2021-02-28 NOTE — ED Provider Notes (Addendum)
[facility] Provider Note    Event Date/Time   First MD Initiated Contact with Patient 02/28/21 1205     (approximate)   History   Hand Pain   HPI  Ryan [name] is a 66 y.o. male with history of arthritis, asthma, CVA and as listed in EMR presents to the emergency department for treatment and evaluation of infection to his right hand.  About 2 and half months ago he was pushing down some trash in the top of the trash can and did not realize there was some broken glass in the can as well.  He sliced his hand.  Areas have not healed well and he has had increasing redness and also some yellow drainage over the past few days..      Physical Exam   Triage Vital Signs: ED Triage Vitals  Enc Vitals Group     BP 02/28/21 1203 (!) 180/100     Pulse Rate 02/28/21 1201 95     Resp 02/28/21 1201 16     Temp 02/28/21 1201 98.2 F (36.8 C)     Temp Source 02/28/21 1201 Oral     SpO2 02/28/21 1201 95 %     Weight 02/28/21 1202 176 lb (79.8 kg)     Height 02/28/21 1202 5\' 7"  (1.702 m)     Head Circumference --      Peak Flow --      Pain Score 02/28/21 1200 3     Pain Loc --      Pain Edu? --      Excl. in GC? --     Most recent vital signs: Vitals:   02/28/21 1201 02/28/21 1203  BP:  (!) 180/100  Pulse: 95   Resp: 16   Temp: 98.2 F (36.8 C)   SpO2: 95%     General: Awake, no distress.  CV:  Good peripheral perfusion.  Resp:  Normal effort.  Abd:  No distention.  Other:  Partially scabbed wounds to the dorsal aspect of the right hand over the base of the thumb and wrist.   ED Results / Procedures / Treatments   Labs (all labs ordered are listed, but only abnormal results are displayed) Labs Reviewed - No data to display   EKG  Not indicated   RADIOLOGY  Image and radiology report reviewed by me.  Image of the right hand negative for retained foreign body or obvious infection.  PROCEDURES:  Critical Care performed:  No  Procedures   MEDICATIONS ORDERED IN ED: Medications - No data to display   IMPRESSION / MDM / ASSESSMENT AND PLAN / ED COURSE  I reviewed the triage vital signs and the nursing notes.                              Differential diagnosis includes, but is not limited to, cellulitis, lymphangitis, sepsis, necrotizing fasciitis  66 year old male presenting to the emergency department for treatment and evaluation of infection to his right hand.  See HPI for further details.  The area is erythematous.  I do not see any indication of systemic infection based on vital signs and exam.  Plan will be to get an x-ray to ensure there is no retained foreign body, or appearance of gas in the area of infection.  X-ray is reassuring.  Plan will be to prescribe grab double coverage with Bactrim and Keflex.  He was encouraged  to follow-up with his primary care provider in 2 to 3 days for wound check.  He is to return to the emergency department for symptoms of change or worsen if he is unable to schedule appointment with primary care.      FINAL CLINICAL IMPRESSION(S) / ED DIAGNOSES   Final diagnoses:  Acute post-traumatic wound infection, initial encounter     Rx / DC Orders   ED Discharge Orders          Ordered    sulfamethoxazole-trimethoprim (BACTRIM DS) 800-160 MG tablet  2 times daily        02/28/21 1301    cephALEXin (KEFLEX) 500 MG capsule  3 times daily        02/28/21 1301             Note:  This document was prepared using Dragon voice recognition software and may include unintentional dictation errors.   Chinita Pester, FNP 02/28/21 1520    Chinita Pester, FNP 02/28/21 1521    Jene Every, MD 02/28/21 1521

## 2021-03-05 DIAGNOSIS — H905 Unspecified sensorineural hearing loss: Secondary | ICD-10-CM | POA: Diagnosis not present

## 2021-03-23 ENCOUNTER — Encounter: Payer: Self-pay | Admitting: Student in an Organized Health Care Education/Training Program

## 2021-03-23 ENCOUNTER — Ambulatory Visit (HOSPITAL_BASED_OUTPATIENT_CLINIC_OR_DEPARTMENT_OTHER): Payer: Medicare Other | Admitting: Student in an Organized Health Care Education/Training Program

## 2021-03-23 ENCOUNTER — Ambulatory Visit
Admission: RE | Admit: 2021-03-23 | Discharge: 2021-03-23 | Disposition: A | Discharge status: Home / Self Care | Payer: Medicare Other | Source: Ambulatory Visit | Attending: Student in an Organized Health Care Education/Training Program | Admitting: Student in an Organized Health Care Education/Training Program

## 2021-03-23 ENCOUNTER — Other Ambulatory Visit: Payer: Self-pay

## 2021-03-23 DIAGNOSIS — M47816 Spondylosis without myelopathy or radiculopathy, lumbar region: Secondary | ICD-10-CM

## 2021-03-23 DIAGNOSIS — G894 Chronic pain syndrome: Secondary | ICD-10-CM

## 2021-03-23 MED ORDER — MIDAZOLAM HCL 5 MG/5ML IJ SOLN
INTRAMUSCULAR | Status: AC
  Filled 2021-03-23: qty 5

## 2021-03-23 MED ORDER — LIDOCAINE HCL (PF) 2 % IJ SOLN
INTRAMUSCULAR | Status: AC
  Filled 2021-03-23: qty 5

## 2021-03-23 MED ORDER — FENTANYL CITRATE (PF) 100 MCG/2ML IJ SOLN
INTRAMUSCULAR | Status: AC
  Filled 2021-03-23: qty 2

## 2021-03-23 MED ORDER — DEXAMETHASONE SODIUM PHOSPHATE 10 MG/ML IJ SOLN
10.0000 mg | Freq: Once | INTRAMUSCULAR | Status: AC
  Administered 2021-03-23: 10 mg

## 2021-03-23 MED ORDER — LIDOCAINE HCL 2 % IJ SOLN
20.0000 mL | Freq: Once | INTRAMUSCULAR | Status: AC
  Administered 2021-03-23: 400 mg

## 2021-03-23 MED ORDER — MIDAZOLAM HCL 5 MG/5ML IJ SOLN
0.5000 mg | Freq: Once | INTRAMUSCULAR | Status: AC
  Administered 2021-03-23: 2 mg via INTRAVENOUS

## 2021-03-23 MED ORDER — DEXAMETHASONE SODIUM PHOSPHATE 10 MG/ML IJ SOLN
INTRAMUSCULAR | Status: AC
  Filled 2021-03-23: qty 1

## 2021-03-23 MED ORDER — ROPIVACAINE HCL 2 MG/ML IJ SOLN
9.0000 mL | Freq: Once | INTRAMUSCULAR | Status: AC
Start: 2021-03-23 — End: 2021-03-23
  Administered 2021-03-23: 9 mL via PERINEURAL

## 2021-03-23 MED ORDER — LIDOCAINE HCL (PF) 2 % IJ SOLN
INTRAMUSCULAR | Status: AC
  Filled 2021-03-23: qty 10

## 2021-03-23 MED ORDER — FENTANYL CITRATE (PF) 100 MCG/2ML IJ SOLN
25.0000 ug | INTRAMUSCULAR | Status: DC | PRN
  Administered 2021-03-23: 100 ug via INTRAVENOUS

## 2021-03-23 NOTE — Progress Notes (Signed)
Patient's Name: Ryan [name]  MRN: [id number]  Referring Provider: [name], MD  DOB: 1955-12-14  PCP: Olin Hauser, DO  DOS: 03/23/2021  Note by: [name], MD  Service setting: Ambulatory outpatient  Specialty: Interventional Pain Management  Patient type: Established  Location: [facility] (AMB) Pain Management Facility  Visit type: Interventional Procedure   Primary Reason for Visit: Interventional Pain Management Treatment. CC: Back Pain (Lumbar bilateral )   Procedure:          Anesthesia, Analgesia, Anxiolysis:  Type: Lumbar Facet, Medial Branch Block(s) #1,  2023 Primary Purpose: Therapeutic Region: Posterolateral Lumbosacral Spine Level: L3, L4, L5,  Medial Branch Level(s). Injecting these levels blocks the L3-4, L4-5 lumbar facet joints. Laterality: Bilateral  Type: moderate sedation Route: Intravenous (IV) IV Access: Secured Sedation: Meaningful verbal contact was maintained at all times during the procedure  Local Anesthetic: Lidocaine 1-2%  Position: Prone   Indications: 1. Lumbar spondylosis   2. Lumbar facet arthropathy   3. Chronic pain syndrome    Pain Score: Pre-procedure: 5 /10 Post-procedure: 0-No pain/10  Pre-op Assessment:  Ryan [name] is a 66 y.o. (year old), male patient, seen today for interventional treatment. He  has a past surgical history that includes Eye surgery; Hernia repair; Transforaminal lumbar interbody fusion (tlif) with pedicle screw fixation 1 level (Right, 06/18/2014); Myringotomy with tube placement (Bilateral, 03/28/2015); Back surgery; Esophagogastroduodenoscopy (egd) with propofol (N/A, 10/24/2015); Cataract extraction w/PHACO (Left, 01/06/2016); Tonsillectomy; Colonoscopy with propofol (N/A, 04/19/2016); and Sinusotomy (02/28/2019). [name] has a current medication list which includes the following prescription(s): alprazolam, aspirin, atorvastatin, gabapentin, lisinopril, nicotine, sildenafil, tamsulosin, ventolin hfa, cetirizine,  and risperidone, and the following Facility-Administered Medications: fentanyl. His primarily concern today is the Back Pain (Lumbar bilateral )   Initial Vital Signs:  Pulse/HCG Rate: 81ECG Heart Rate: (!) 51 Temp: (!) 97.2 F (36.2 C) Resp: 16 BP: (!) 171/84 SpO2: 100 %  BMI: Estimated body mass index is 27.57 kg/m as calculated from the following:   Height as of this encounter: 5\' 7"  (1.702 m).   Weight as of this encounter: 176 lb (79.8 kg).  Risk Assessment: Allergies: Reviewed. He has No Known Allergies.  Allergy Precautions: None required Coagulopathies: Reviewed. None identified.  Blood-thinner therapy: None at this time Active Infection(s): Reviewed. None identified. Ryan [name] is afebrile  Site Confirmation: Ryan [name] was asked to confirm the procedure and laterality before marking the site Procedure checklist: Completed Consent: Before the procedure and under the influence of no sedative(s), amnesic(s), or anxiolytics, the patient was informed of the treatment options, risks and possible complications. To fulfill our ethical and legal obligations, as recommended by the American Medical Association's Code of Ethics, I have informed the patient of my clinical impression; the nature and purpose of the treatment or procedure; the risks, benefits, and possible complications of the intervention; the alternatives, including doing nothing; the risk(s) and benefit(s) of the alternative treatment(s) or procedure(s); and the risk(s) and benefit(s) of doing nothing. The patient was provided information about the general risks and possible complications associated with the procedure. These may include, but are not limited to: failure to achieve desired goals, infection, bleeding, organ or nerve damage, allergic reactions, paralysis, and death. In addition, the patient was informed of those risks and complications associated to Spine-related procedures, such as failure to decrease pain;  infection (i.e.: Meningitis, epidural or intraspinal abscess); bleeding (i.e.: epidural hematoma, subarachnoid hemorrhage, or any other type of intraspinal or peri-dural bleeding); organ or nerve damage (  i.e.: Any type of peripheral nerve, nerve root, or spinal cord injury) with subsequent damage to sensory, motor, and/or autonomic systems, resulting in permanent pain, numbness, and/or weakness of one or several areas of the body; allergic reactions; (i.e.: anaphylactic reaction); and/or death. Furthermore, the patient was informed of those risks and complications associated with the medications. These include, but are not limited to: allergic reactions (i.e.: anaphylactic or anaphylactoid reaction(s)); adrenal axis suppression; blood sugar elevation that in diabetics may result in ketoacidosis or comma; water retention that in patients with history of congestive heart failure may result in shortness of breath, pulmonary edema, and decompensation with resultant heart failure; weight gain; swelling or edema; medication-induced neural toxicity; particulate matter embolism and blood vessel occlusion with resultant organ, and/or nervous system infarction; and/or aseptic necrosis of one or more joints. Finally, the patient was informed that Medicine is not an exact science; therefore, there is also the possibility of unforeseen or unpredictable risks and/or possible complications that may result in a catastrophic outcome. The patient indicated having understood very clearly. We have given the patient no guarantees and we have made no promises. Enough time was given to the patient to ask questions, all of which were answered to the patient's satisfaction. Ryan [name] has indicated that he wanted to continue with the procedure. Attestation: I, the ordering provider, attest that I have discussed with the patient the benefits, risks, side-effects, alternatives, likelihood of achieving goals, and potential problems during  recovery for the procedure that I have provided informed consent. Date   Time: 03/23/2021  9:29 AM  Pre-Procedure Preparation:  Monitoring: As per clinic protocol. Respiration, ETCO2, SpO2, BP, heart rate and rhythm monitor placed and checked for adequate function Safety Precautions: Patient was assessed for positional comfort and pressure points before starting the procedure. Time-out: I initiated and conducted the "Time-out" before starting the procedure, as per protocol. The patient was asked to participate by confirming the accuracy of the "Time Out" information. Verification of the correct person, site, and procedure were performed and confirmed by me, the nursing staff, and the patient. "Time-out" conducted as per Joint Commission's Universal Protocol (UP.01.01.01). Time: 1020  Description of Procedure:          Laterality: Bilateral. The procedure was performed in identical fashion on both sides. Levels:  L3, L4, L5,  Medial Branch Level(s) Area Prepped: Posterior Lumbosacral Region Prepping solution: ChloraPrep (2% chlorhexidine gluconate and 70% isopropyl alcohol) Safety Precautions: Aspiration looking for blood return was conducted prior to all injections. At no point did we inject any substances, as a needle was being advanced. Before injecting, the patient was told to immediately notify me if he was experiencing any new onset of "ringing in the ears, or metallic taste in the mouth". No attempts were made at seeking any paresthesias. Safe injection practices and needle disposal techniques used. Medications properly checked for expiration dates. SDV (single dose vial) medications used. After the completion of the procedure, all disposable equipment used was discarded in the proper designated medical waste containers. Local Anesthesia: Protocol guidelines were followed. The patient was positioned over the fluoroscopy table. The area was prepped in the usual manner. The time-out was completed.  The target area was identified using fluoroscopy. A 12-in long, straight, sterile hemostat was used with fluoroscopic guidance to locate the targets for each level blocked. Once located, the skin was marked with an approved surgical skin marker. Once all sites were marked, the skin (epidermis, dermis, and hypodermis), as well as deeper  tissues (fat, connective tissue and muscle) were infiltrated with a Rys amount of a short-acting local anesthetic, loaded on a 10cc syringe with a 25G, 1.5-in  Needle. An appropriate amount of time was allowed for local anesthetics to take effect before proceeding to the next step. Local Anesthetic: Lidocaine 2.0% The unused portion of the local anesthetic was discarded in the proper designated containers. Technical explanation of process:   L3 Medial Branch Nerve Block (MBB): The target area for the L3 medial branch is at the junction of the postero-lateral aspect of the superior articular process and the superior, posterior, and medial edge of the transverse process of L4. Under fluoroscopic guidance, a Quincke needle was inserted until contact was made with os over the superior postero-lateral aspect of the pedicular shadow (target area). After negative aspiration for blood,1 cc  mL of the nerve block solution was injected without difficulty or complication. The needle was removed intact. L4 Medial Branch Nerve Block (MBB): The target area for the L4 medial branch is at the junction of the postero-lateral aspect of the superior articular process and the superior, posterior, and medial edge of the transverse process of L5. Under fluoroscopic guidance, a Quincke needle was inserted until contact was made with os over the superior postero-lateral aspect of the pedicular shadow (target area). After negative aspiration for blood,1cc mL of the nerve block solution was injected without difficulty or complication. The needle was removed intact. L5 Medial Branch Nerve Block (MBB):  The target area for the L5 medial branch is at the junction of the postero-lateral aspect of the superior articular process and the superior, posterior, and medial edge of the sacral ala. Under fluoroscopic guidance, a Quincke needle was inserted until contact was made with os over the superior postero-lateral aspect of the pedicular shadow (target area). After negative aspiration for blood,1cc  mL of the nerve block solution was injected without difficulty or complication. The needle was removed intact.   Nerve block solution: 10 cc solution made of 8 cc of 0.2% ropivacaine, 2 cc of Decadron 10 mg/cc.  1-1.5 cc injected at each level above bilaterally  Procedural Needles: 22-gauge, 3.5-inch, Quincke needles used for all levels.  Once the entire procedure was completed, the treated area was cleaned, making sure to leave some of the prepping solution back to take advantage of its long term bactericidal properties.   Illustration of the posterior view of the lumbar spine and the posterior neural structures. Laminae of L2 through S1 are labeled. DPRL5, dorsal primary ramus of L5; DPRS1, dorsal primary ramus of S1; DPR3, dorsal primary ramus of L3; FJ, facet (zygapophyseal) joint L3-L4; I, inferior articular process of L4; LB1, lateral branch of dorsal primary ramus of L1; IAB, inferior articular branches from L3 medial branch (supplies L4-L5 facet joint); IBP, intermediate branch plexus; MB3, medial branch of dorsal primary ramus of L3; NR3, third lumbar nerve root; S, superior articular process of L5; SAB, superior articular branches from L4 (supplies L4-5 facet joint also); TP3, transverse process of L3.  Vitals:   03/23/21 1032 03/23/21 1039 03/23/21 1048 03/23/21 1059  BP: 122/82 (!) 145/96 (!) 148/87 (!) 146/85  Pulse:      Resp: 16 11 12 14   Temp:  97.6 F (36.4 C)  97.6 F (36.4 C)  TempSrc:      SpO2: 100% 100% 100% 100%  Weight:      Height:         Start Time: 1020 hrs. End Time:  1032  hrs.  Imaging Guidance (Spinal):          Type of Imaging Technique: Fluoroscopy Guidance (Spinal) Indication(s): Assistance in needle guidance and placement for procedures requiring needle placement in or near specific anatomical locations not easily accessible without such assistance. Exposure Time: Please see nurses notes. Contrast: None used. Fluoroscopic Guidance: I was personally present during the use of fluoroscopy. "Tunnel Vision Technique" used to obtain the best possible view of the target area. Parallax error corrected before commencing the procedure. "Direction-depth-direction" technique used to introduce the needle under continuous pulsed fluoroscopy. Once target was reached, antero-posterior, oblique, and lateral fluoroscopic projection used confirm needle placement in all planes. Images permanently stored in EMR. Interpretation: No contrast injected. I personally interpreted the imaging intraoperatively. Adequate needle placement confirmed in multiple planes. Permanent images saved into the patient's record.   Post-operative Assessment:  Post-procedure Vital Signs:  Pulse/HCG Rate: 8164 Temp: 97.6 F (36.4 C) Resp: 14 BP: (!) 146/85 SpO2: 100 %  EBL: None  Complications: No immediate post-treatment complications observed by team, or reported by patient.  Note: The patient tolerated the entire procedure well. A repeat set of vitals were taken after the procedure and the patient was kept under observation following institutional policy, for this type of procedure. Post-procedural neurological assessment was performed, showing return to baseline, prior to discharge. The patient was provided with post-procedure discharge instructions, including a section on how to identify potential problems. Should any problems arise concerning this procedure, the patient was given instructions to immediately contact us, at any time, without hesitation. In any case, we plan to contact the  patient by telephone for a follow-up status report regarding this interventional procedure.  Comments:  No additional relevant information.  Plan of Care   Imaging Orders         DG PAIN CLINIC C-ARM 1-60 MIN NO REPORT     Medications ordered for procedure: Meds ordered this encounter  Medications   lidocaine (XYLOCAINE) 2 % (with pres) injection 400 mg   midazolam (VERSED) 5 MG/5ML injection 0.5-2 mg    Make sure Flumazenil is available in the pyxis when using this medication. If oversedation occurs, administer 0.2 mg IV over 15 sec. If after 45 sec no response, administer 0.2 mg again over 1 min; may repeat at 1 min intervals; not to exceed 4 doses (1 mg)   fentaNYL (SUBLIMAZE) injection 25-50 mcg    Make sure Narcan is available in the pyxis when using this medication. In the event of respiratory depression (RR< 8/min): Titrate NARCAN (naloxone) in increments of 0.1 to 0.2 mg IV at 2-3 minute intervals, until desired degree of reversal.   dexamethasone (DECADRON) injection 10 mg   dexamethasone (DECADRON) injection 10 mg   ropivacaine (PF) 2 mg/mL (0.2%) (NAROPIN) injection 9 mL   ropivacaine (PF) 2 mg/mL (0.2%) (NAROPIN) injection 9 mL   Medications administered: We administered lidocaine, midazolam, fentaNYL, dexamethasone, dexamethasone, ropivacaine (PF) 2 mg/mL (0.2%), and ropivacaine (PF) 2 mg/mL (0.2%).  See the medical record for exact dosing, route, and time of administration.  Disposition: Discharge home  Discharge Date & Time: 03/23/2021; 1101 hrs.   Physician-requested Follow-up: Return in about 9 weeks (around 05/25/2021) for Post Procedure Evaluation, virtual.  Future Appointments  Date Time Provider [place]  05/25/2021  3:00 PM [name], MD [facility] None    Primary Care Physician: Olin Hauser, DO Location: [facility] Note by: [name], MD Date: 03/23/2021; Time: 11:52 AM  Disclaimer:  Medicine is not  an Chief Strategy Officer. The only guarantee in medicine is that nothing is guaranteed. It is important to note that the decision to proceed with this intervention was based on the information collected from the patient. The Data and conclusions were drawn from the patient's questionnaire, the interview, and the physical examination. Because the information was provided in large part by the patient, it cannot be guaranteed that it has not been purposely or unconsciously manipulated. Every effort has been made to obtain as much relevant data as possible for this evaluation. It is important to note that the conclusions that lead to this procedure are derived in large part from the available data. Always take into account that the treatment will also be dependent on availability of resources and existing treatment guidelines, considered by other Pain Management Practitioners as being common knowledge and practice, at the time of the intervention. For Medico-Legal purposes, it is also important to point out that variation in procedural techniques and pharmacological choices are the acceptable norm. The indications, contraindications, technique, and results of the above procedure should only be interpreted and judged by a Board-Certified Interventional Pain Specialist with extensive familiarity and expertise in the same exact procedure and technique.

## 2021-03-23 NOTE — Progress Notes (Signed)
Safety precautions to be maintained throughout the outpatient stay will include: orient to surroundings, keep bed in low position, maintain call bell within reach at all times, provide assistance with transfer out of bed and ambulation.  

## 2021-03-24 ENCOUNTER — Telehealth: Payer: Self-pay | Admitting: *Deleted

## 2021-03-24 ENCOUNTER — Other Ambulatory Visit: Payer: Self-pay | Admitting: Family Medicine

## 2021-03-24 DIAGNOSIS — I10 Essential (primary) hypertension: Secondary | ICD-10-CM

## 2021-03-24 NOTE — Telephone Encounter (Signed)
Requested Prescriptions  Pending Prescriptions Disp Refills   lisinopril (ZESTRIL) 40 MG tablet [Pharmacy Med Name: Lisinopril 40 MG Oral Tablet] 90 tablet 1    Sig: Take 1 tablet by mouth once daily     Cardiovascular:  ACE Inhibitors Failed - 03/24/2021 10:34 AM      Failed - Last BP in normal range    BP Readings from Last 1 Encounters:  03/23/21 (!) 146/85         Failed - Valid encounter within last 6 months    Recent Outpatient Visits          1 month ago Welcome to [employer] preventive visit   [place], Devonne Doughty, DO   7 months ago Essential hypertension   [place], Devonne Doughty, DO   1 year ago Loose stools   [facility] Myles Gip, DO   1 year ago Pain and swelling of right knee   [place], Devonne Doughty, DO   1 year ago Essential hypertension   [place], DO             Passed - Cr in normal range and within 180 days    Creat  Date Value Ref Range Status  02/02/2021 0.90 0.70 - 1.35 mg/dL Final         Passed - K in normal range and within 180 days    Potassium  Date Value Ref Range Status  02/02/2021 4.2 3.5 - 5.3 mmol/L Final  07/15/2011 4.3 3.5 - 5.1 mmol/L Final         Passed - Patient is not pregnant

## 2021-03-24 NOTE — Telephone Encounter (Signed)
Attempted to call for post procedure follow-up. No answer, no voicemail. 

## 2021-03-25 ENCOUNTER — Other Ambulatory Visit: Payer: Self-pay | Admitting: Family Medicine

## 2021-03-25 DIAGNOSIS — J432 Centrilobular emphysema: Secondary | ICD-10-CM

## 2021-03-25 MED ORDER — PROAIR HFA 108 (90 BASE) MCG/ACT IN AERS: 1.0000 | INHALATION_SPRAY | Freq: Four times a day (QID) | RESPIRATORY_TRACT | 2 refills | Status: DC | PRN

## 2021-03-26 ENCOUNTER — Other Ambulatory Visit: Payer: Self-pay | Admitting: Family Medicine

## 2021-03-26 ENCOUNTER — Telehealth: Payer: Self-pay

## 2021-03-26 DIAGNOSIS — J432 Centrilobular emphysema: Secondary | ICD-10-CM

## 2021-03-26 MED ORDER — ALBUTEROL SULFATE HFA 108 (90 BASE) MCG/ACT IN AERS: 2.0000 | INHALATION_SPRAY | Freq: Four times a day (QID) | RESPIRATORY_TRACT | 2 refills | Status: DC | PRN

## 2021-03-26 NOTE — Telephone Encounter (Signed)
I have submitted the other generic albuterol (ventolin) just now. Hopefully this will work - or they need to send [place] the exact order they are requesting next time. ? ?Saralyn Pilar, DO ?[facility] ?[facility] ?03/26/2021, 4:17 PM ? ?

## 2021-03-26 NOTE — Telephone Encounter (Signed)
Copied from CRM [id number]. Topic: General - Other ?>> Mar 26, 2021  2:55 PM Gaetana Michaelis A wrote: ?Reason for CRM: Courtney with patient's pharmacy has called to request an alternative to patient's albuterol [facility] HFA) 108 (90 Base) MCG/ACT inhaler [[id number]]  prescription  ? ?This particular inhaler is no longer being manufactured, per the pharmacy  ? ?Please contact further when possible ?

## 2021-03-26 NOTE — Telephone Encounter (Signed)
Requested medication (s) are due for refill today: see request  ? ?Requested medication (s) are on the active medication list: yes ? ?Last refill:  03/25/21 #3 each 2 refills ? ?Future visit scheduled: no seen yesterday  ? ?Notes to clinic:  Pharmacy comment: proair is not made anymore. please ok the generic ? ? ?  ?Requested Prescriptions  ?Pending Prescriptions Disp Refills  ? PROAIR HFA 108 (90 Base) MCG/ACT inhaler [Pharmacy Med Name: PROAIR HFA          AER] 27 g 2  ?  Sig: INHALE 1 TO 2 PUFFS BY MOUTH EVERY 6 HOURS AS NEEDED FOR WHEEZING FOR SHORTNESS OF BREATH  ?  ? Pulmonology:  Beta Agonists 2 Failed - 03/26/2021 10:37 AM  ?  ?  Failed - Last BP in normal range  ?  BP Readings from Last 1 Encounters:  ?03/23/21 (!) 146/85  ?  ?  ?  ?  Passed - Last Heart Rate in normal range  ?  Pulse Readings from Last 1 Encounters:  ?03/23/21 81  ?  ?  ?  ?  Passed - Valid encounter within last 12 months  ?  Recent Outpatient Visits   ? ?      ? 1 month ago Welcome to [employer] preventive visit  ? [facility] [place], Netta Neat, DO  ? 7 months ago Essential hypertension  ? [facility] [place], Netta Neat, DO  ? 1 year ago Loose stools  ? [facility] [place] Dense M, DO  ? 1 year ago Pain and swelling of right knee  ? [facility] Smitty Cords, DO  ? 1 year ago Essential hypertension  ? [facility] Smitty Cords, DO  ? ?  ?  ? ?  ?  ?  ? ?

## 2021-05-25 ENCOUNTER — Ambulatory Visit
Payer: Medicare Other | Attending: Student in an Organized Health Care Education/Training Program | Admitting: Student in an Organized Health Care Education/Training Program

## 2021-05-25 ENCOUNTER — Encounter: Payer: Self-pay | Admitting: Student in an Organized Health Care Education/Training Program

## 2021-05-25 DIAGNOSIS — M47816 Spondylosis without myelopathy or radiculopathy, lumbar region: Secondary | ICD-10-CM

## 2021-05-25 DIAGNOSIS — M961 Postlaminectomy syndrome, not elsewhere classified: Secondary | ICD-10-CM | POA: Diagnosis not present

## 2021-05-25 DIAGNOSIS — M5137 Other intervertebral disc degeneration, lumbosacral region: Secondary | ICD-10-CM

## 2021-05-25 DIAGNOSIS — G894 Chronic pain syndrome: Secondary | ICD-10-CM

## 2021-05-25 DIAGNOSIS — M51379 Other intervertebral disc degeneration, lumbosacral region without mention of lumbar back pain or lower extremity pain: Secondary | ICD-10-CM

## 2021-05-25 DIAGNOSIS — M48062 Spinal stenosis, lumbar region with neurogenic claudication: Secondary | ICD-10-CM

## 2021-05-25 NOTE — Progress Notes (Signed)
Patient: Ryan [name]  Service Category: E/M  Provider: [name], MD  ?DOB: [birth year]  DOS: 05/25/2021  Location: Office  ?MRN: [id number]  Setting: Ambulatory outpatient  Referring Provider: Nobie [name] *  ?Type: Established Patient  Specialty: Interventional Pain Management  PCP: Ryan Hauser, DO  ?Location: Remote location  Delivery: TeleHealth    ? ?Virtual Encounter - Pain Management ?PROVIDER NOTE: Information contained herein reflects review and annotations entered in association with encounter. Interpretation of such information and data should be left to medically-trained personnel. Information provided to patient can be located elsewhere in the medical record under "Patient Instructions". Document created using STT-dictation technology, any transcriptional errors that may result from process are unintentional.  ?  ?Contact & Pharmacy ?Preferred: [phone] ?Home: [phone] (home) ?Mobile: [phone] (mobile) ?E-mail: [email or web address]  ?Ryan [name] (N), Ryan [name] ?Ryan [name] (Ryan [name]) Ryan [name] [address] ?Phone: [phone] Fax: [phone] ?  ?Pre-screening  ?Ryan [name] offered "in-person" vs "virtual" encounter. He indicated preferring virtual for this encounter.  ? ?Reason ?COVID-19*  Social distancing based on CDC and AMA recommendations.  ? ?I contacted Ryan [name] on 05/25/2021 via telephone.      I clearly identified myself as Ryan Santa, MD. I verified that I was speaking with the correct person using two identifiers (Name: Ryan [name], and date of birth: 09-02-1955). ? ?Consent ?I sought verbal advanced consent from Ryan [name] for virtual visit interactions. I informed Ryan [name] of possible security and privacy concerns, risks, and limitations associated with providing "not-in-person" medical evaluation and management services. I also informed Ryan [name] of the availability of "in-person"  appointments. Finally, I informed him that there would be a charge for the virtual visit and that he could be  personally, fully or partially, financially responsible for it. Ryan [name] expressed understanding and agreed to proceed.  ? ?Historic Elements   ?Ryan [name] is a 66 y.o. year old, male patient evaluated today after our last contact on 03/23/2021. Ryan [name]  has a past medical history of Anxiety, Arthritis, Asthma, Chronic kidney disease, COPD (chronic obstructive pulmonary disease) ([place]), Dyspnea, GERD (gastroesophageal reflux disease), Hyperlipidemia, Hypertension, Lower urinary tract symptoms (LUTS), Prostate disease, Stroke [facility]), Thyroid disease, Wears dentures, and Wears hearing aid. He also  has a past surgical history that includes Eye surgery; Hernia repair; Transforaminal lumbar interbody fusion (tlif) with pedicle screw fixation 1 level (Right, 06/18/2014); Myringotomy with tube placement (Bilateral, 03/28/2015); Back surgery; Esophagogastroduodenoscopy (egd) with propofol (N/A, 10/24/2015); Cataract extraction w/PHACO (Left, 01/06/2016); Tonsillectomy; Colonoscopy with propofol (N/A, 04/19/2016); and Sinusotomy (02/28/2019). Ryan [name] has a current medication list which includes the following prescription(s): albuterol, alprazolam, aspirin, atorvastatin, gabapentin, lisinopril, sildenafil, tamsulosin, cetirizine, nicotine, and risperidone. He  reports that he has been smoking cigarettes. He has a 60.00 pack-year smoking history. He uses smokeless tobacco. He reports current alcohol use. He reports that he does not use drugs. Ryan [name] has No Known Allergies.  ? ?HPI  ?Today, he is being contacted for a post-procedure assessment. ? ? ?Post-procedure evaluation  ? ?Type: Lumbar Facet, Medial Branch Block(s) #1,  2023 ?Primary Purpose: Therapeutic ?Region: Posterolateral Lumbosacral Spine ?Level: L3, L4, L5,  Medial Branch Level(s). Injecting these levels blocks the L3-4, L4-5 lumbar facet  joints. ?Laterality: Bilateral ? ?Effectiveness:  ?Initial hour after procedure: 50 %  ?Subsequent 4-6 hours post-procedure: 50 %  ?Analgesia past initial 6 hours: 50 % (patient reports that he was very  sore after procedure and states he did not get much relief at all.  reports it was a little bit better for the first week.)  ?Ongoing improvement:  ?Analgesic:  50% for 3 months now with pain returning in axial lower back ?Function: Back to baseline ?ROM: Back to baseline ? ? ? ?Laboratory Chemistry Profile  ? ?Renal ?Lab Results  ?Component Value Date  ? BUN 19 02/02/2021  ? CREATININE 0.90 02/02/2021  ? BCR NOT APPLICABLE 93/90/3009  ? GFRAA >60 09/11/2019  ? GFRNONAA >60 08/02/2020  ?  Hepatic ?Lab Results  ?Component Value Date  ? AST 14 02/02/2021  ? ALT 11 02/02/2021  ? ALBUMIN 4.6 07/04/2020  ? ALKPHOS 102 07/04/2020  ? LIPASE 77 (H) 10/19/2017  ?  ?Electrolytes ?Lab Results  ?Component Value Date  ? NA 139 02/02/2021  ? K 4.2 02/02/2021  ? CL 103 02/02/2021  ? CALCIUM 8.9 02/02/2021  ?  Bone ?Lab Results  ?Component Value Date  ? TESTOSTERONE 473 02/02/2021  ?  ?Inflammation (CRP: Acute Phase) (ESR: Chronic Phase) ?No results found for: CRP, ESRSEDRATE, LATICACIDVEN    ?  ? ?Note: Above Lab results reviewed. ? ? ?Assessment  ?The primary encounter diagnosis was Lumbar spondylosis. Diagnoses of Lumbar facet arthropathy, DDD (degenerative disc disease), lumbosacral, Spinal stenosis, lumbar region, with neurogenic claudication, Failed back surgical syndrome, and Chronic pain syndrome were also pertinent to this visit. ? ?Plan of Care  ?1. Lumbar spondylosis ?- LUMBAR FACET(MEDIAL BRANCH NERVE BLOCK) MBNB; Future ? ?2. Lumbar facet arthropathy ?- LUMBAR FACET(MEDIAL BRANCH NERVE BLOCK) MBNB; Future ? ?3. DDD (degenerative disc disease), lumbosacral ?- LUMBAR FACET(MEDIAL BRANCH NERVE BLOCK) MBNB; Future ? ?4. Spinal stenosis, lumbar region, with neurogenic claudication ? ?5. Failed back surgical syndrome ? ?6.  Chronic pain syndrome ?- LUMBAR FACET(MEDIAL BRANCH NERVE BLOCK) MBNB; Future ? ? ? ? ?Orders:  ?Orders Placed This Encounter  ?Procedures  ? LUMBAR FACET(MEDIAL BRANCH NERVE BLOCK) MBNB  ?  Standing Status:   Future  ?  Standing Expiration Date:   06/25/2021  ?  Scheduling Instructions:  ?   Procedure: Lumbar facet block (AKA.: Lumbosacral medial branch nerve block)  ?   Side: Bilateral  ?   Level: L3-4, L4-5, Facets ( L3, L4, L5,  Medial Branch Nerves)  ?   Sedation: with, ECT  ?   Timeframe: ASAA  ?  Order Specific Question:   Where will this procedure be performed?  ?  Answer:   [facility]  ? ?Follow-up plan:   ?Return in about 2 weeks (around 06/08/2021) for B/L L3, 4, 5 MBNB , ECT.   ?  ?Status post bilateral L3, L4, L5, S1 lumbar facet medial branch nerve blocks on 03/20/2018 helped 85% pain relief for approximately 1 year. Status post left L3, L4, L5 RFA on 07/11/2019, Right L3, L4, L5 RFA 07/25/19. Not as helpful as lumbar facet MBBs- repeat PRN, consider SPRINT PNS            ? ?  ?Recent Visits ?Date Type Provider Dept  ?03/23/21 Procedure visit Ryan Santa, MD [facility]  ?02/26/21 Office Visit Ryan Santa, MD [facility]  ?Showing recent visits within past 90 days and meeting all other requirements ?Today's Visits ?Date Type Provider Dept  ?05/25/21 Office Visit Ryan Santa, MD [facility]  ?Showing today's visits and meeting all other requirements ?Future Appointments ?No visits were found meeting these conditions. ?Showing future appointments within next 90 days and  meeting all other requirements ? ?I discussed the assessment and treatment plan with the patient. The patient was provided an opportunity to ask questions and all were answered. The patient agreed with the plan and demonstrated an understanding of the instructions. ? ?Patient advised to call back or seek an in-person evaluation if the symptoms or condition worsens. ? ?Duration of encounter:  30mnutes. ? ?Note by: BGillis Santa MD ?Date: 05/25/2021; Time: 1:17 PM ?

## 2021-05-26 NOTE — Patient Instructions (Signed)
______________________________________________________________________  Preparing for Procedure with Sedation  NOTICE: Due to recent regulatory changes, starting on August 25, 2020, procedures requiring intravenous (IV) sedation will no longer be performed at the [facility].  These types of procedures are required to be performed at [facility] ambulatory surgery facility.  We are very sorry for the inconvenience.  Procedure appointments are limited to planned procedures: No Prescription Refills. No disability issues will be discussed. No medication changes will be discussed.  Instructions: Oral Intake: Do not eat or drink anything for at least 8 hours prior to your procedure. (Exception: Blood Pressure Medication. See below.) Transportation: A driver is required. You may not drive yourself after the procedure. Blood Pressure Medicine: Do not forget to take your blood pressure medicine with a sip of water the morning of the procedure. If your Diastolic (lower reading) is above 100 mmHg, elective cases will be cancelled/rescheduled. Blood thinners: These will need to be stopped for procedures. Notify our staff if you are taking any blood thinners. Depending on which one you take, there will be specific instructions on how and when to stop it. Diabetics on insulin: Notify the staff so that you can be scheduled 1st case in the morning. If your diabetes requires high dose insulin, take only  of your normal insulin dose the morning of the procedure and notify the staff that you have done so. Preventing infections: Shower with an antibacterial soap the morning of your procedure. Build-up your immune system: Take 1000 mg of Vitamin C with every meal (3 times a day) the day prior to your procedure. Antibiotics: Inform the staff if you have a condition or reason that requires you to take antibiotics before dental procedures. Pregnancy: If you are pregnant, call and cancel the procedure. Sickness: If  you have a cold, fever, or any active infections, call and cancel the procedure. Arrival: You must be in the facility at least 30 minutes prior to your scheduled procedure. Children: Do not bring children with you. Dress appropriately: There is always the possibility that your clothing may get soiled. Valuables: Do not bring any jewelry or valuables.  Reasons to call and reschedule or cancel your procedure: (Following these recommendations will minimize the risk of a serious complication.) Surgeries: Avoid having procedures within 2 weeks of any surgery. (Avoid for 2 weeks before or after any surgery). Flu Shots: Avoid having procedures within 2 weeks of a flu shots. (Avoid for 2 weeks before or after immunizations). Barium: Avoid having a procedure within 7-10 days after having had a radiological study involving the use of radiological contrast. (Myelograms, Barium swallow or enema study). Heart attacks: Avoid any elective procedures or surgeries for the initial 6 months after a "Myocardial Infarction" (Heart Attack). Blood thinners: It is imperative that you stop these medications before procedures. Let us know if you if you take any blood thinner.  Infection: Avoid procedures during or within two weeks of an infection (including chest colds or gastrointestinal problems). Symptoms associated with infections include: Localized redness, fever, chills, night sweats or profuse sweating, burning sensation when voiding, cough, congestion, stuffiness, runny nose, sore throat, diarrhea, nausea, vomiting, cold or Flu symptoms, recent or current infections. It is specially important if the infection is over the area that we intend to treat. Heart and lung problems: Symptoms that may suggest an active cardiopulmonary problem include: cough, chest pain, breathing difficulties or shortness of breath, dizziness, ankle swelling, uncontrolled high or unusually low blood pressure, and/or palpitations. If you are    experiencing any of these symptoms, cancel your procedure and contact your primary care physician for an evaluation.  Remember:  Regular Business hours are:  Monday to Thursday 8:00 AM to 4:00 PM  Provider's Schedule: Francisco Naveira, MD:  Procedure days: Tuesday and Thursday 7:30 AM to 4:00 PM  Bilal Lateef, MD:  Procedure days: Monday and Wednesday 7:30 AM to 4:00 PM ______________________________________________________________________   

## 2021-06-08 ENCOUNTER — Ambulatory Visit (HOSPITAL_BASED_OUTPATIENT_CLINIC_OR_DEPARTMENT_OTHER): Payer: Medicare Other | Admitting: Student in an Organized Health Care Education/Training Program

## 2021-06-08 ENCOUNTER — Ambulatory Visit
Admission: RE | Admit: 2021-06-08 | Discharge: 2021-06-08 | Disposition: A | Discharge status: Home / Self Care | Payer: Medicare Other | Source: Ambulatory Visit | Attending: Student in an Organized Health Care Education/Training Program | Admitting: Student in an Organized Health Care Education/Training Program

## 2021-06-08 ENCOUNTER — Encounter: Payer: Self-pay | Admitting: Student in an Organized Health Care Education/Training Program

## 2021-06-08 VITALS — BP 152/89 | HR 65 | Temp 97.1°F | Resp 17 | Ht 67.0 in | Wt 173.0 lb

## 2021-06-08 DIAGNOSIS — G894 Chronic pain syndrome: Secondary | ICD-10-CM | POA: Insufficient documentation

## 2021-06-08 DIAGNOSIS — M47816 Spondylosis without myelopathy or radiculopathy, lumbar region: Secondary | ICD-10-CM | POA: Diagnosis not present

## 2021-06-08 MED ORDER — LIDOCAINE HCL 2 % IJ SOLN
20.0000 mL | Freq: Once | INTRAMUSCULAR | Status: AC
  Administered 2021-06-08: 400 mg

## 2021-06-08 MED ORDER — DEXAMETHASONE SODIUM PHOSPHATE 10 MG/ML IJ SOLN
10.0000 mg | Freq: Once | INTRAMUSCULAR | Status: AC
Start: 2021-06-08 — End: 2021-06-08
  Administered 2021-06-08: 10 mg

## 2021-06-08 MED ORDER — DEXAMETHASONE SODIUM PHOSPHATE 10 MG/ML IJ SOLN
10.0000 mg | Freq: Once | INTRAMUSCULAR | Status: AC
  Administered 2021-06-08: 10 mg

## 2021-06-08 MED ORDER — FENTANYL CITRATE (PF) 100 MCG/2ML IJ SOLN
INTRAMUSCULAR | Status: AC
  Filled 2021-06-08: qty 2

## 2021-06-08 MED ORDER — LIDOCAINE HCL 2 % IJ SOLN
INTRAMUSCULAR | Status: AC
  Filled 2021-06-08: qty 20

## 2021-06-08 MED ORDER — MIDAZOLAM HCL 5 MG/5ML IJ SOLN
0.5000 mg | Freq: Once | INTRAMUSCULAR | Status: AC
  Administered 2021-06-08: 2 mg via INTRAVENOUS

## 2021-06-08 MED ORDER — ROPIVACAINE HCL 2 MG/ML IJ SOLN
INTRAMUSCULAR | Status: AC
  Filled 2021-06-08: qty 20

## 2021-06-08 MED ORDER — ROPIVACAINE HCL 2 MG/ML IJ SOLN
9.0000 mL | Freq: Once | INTRAMUSCULAR | Status: AC
  Administered 2021-06-08: 9 mL via PERINEURAL

## 2021-06-08 MED ORDER — DEXAMETHASONE SODIUM PHOSPHATE 10 MG/ML IJ SOLN
INTRAMUSCULAR | Status: AC
  Filled 2021-06-08: qty 2

## 2021-06-08 MED ORDER — FENTANYL CITRATE (PF) 100 MCG/2ML IJ SOLN
25.0000 ug | INTRAMUSCULAR | Status: DC | PRN
  Administered 2021-06-08: 100 ug via INTRAVENOUS

## 2021-06-08 MED ORDER — MIDAZOLAM HCL 5 MG/5ML IJ SOLN
INTRAMUSCULAR | Status: AC
  Filled 2021-06-08: qty 5

## 2021-06-08 NOTE — Patient Instructions (Signed)
____________________________________________________________________________________________  Post-Procedure Discharge Instructions  Instructions:  Apply ice:   Purpose: This will minimize any swelling and discomfort after procedure.   When: Day of procedure, as soon as you get home.  How: Fill a plastic sandwich bag with crushed ice. Cover it with a [place] towel and apply to injection site.  How long: (15 min on, 15 min off) Apply for 15 minutes then remove x 15 minutes.  Repeat sequence on day of procedure, until you go to bed.  Apply heat:   Purpose: To treat any soreness and discomfort from the procedure.  When: Starting the next day after the procedure.  How: Apply heat to procedure site starting the day following the procedure.  How long: May continue to repeat daily, until discomfort goes away.  Food intake: Start with clear liquids (like water) and advance to regular food, as tolerated.   Physical activities: Keep activities to a minimum for the first 8 hours after the procedure. After that, then as tolerated.  Driving: If you have received any sedation, be responsible and do not drive. You are not allowed to drive for 24 hours after having sedation.  Blood thinner: (Applies only to those taking blood thinners) You may restart your blood thinner 6 hours after your procedure.  Insulin: (Applies only to Diabetic patients taking insulin) As soon as you can eat, you may resume your normal dosing schedule.  Infection prevention: Keep procedure site clean and dry. Shower daily and clean area with soap and water.  Post-procedure Pain Diary: Extremely important that this be done correctly and accurately. Recorded information will be used to determine the next step in treatment. For the purpose of accuracy, follow these rules:  Evaluate only the area treated. Do not report or include pain from an untreated area. For the purpose of this evaluation, ignore all other areas of pain,  except for the treated area.  After your procedure, avoid taking a long nap and attempting to complete the pain diary after you wake up. Instead, set your alarm clock to go off every hour, on the hour, for the initial 8 hours after the procedure. Document the duration of the numbing medicine, and the relief you are getting from it.  Do not go to sleep and attempt to complete it later. It will not be accurate. If you received sedation, it is likely that you were given a medication that may cause amnesia. Because of this, completing the diary at a later time may cause the information to be inaccurate. This information is needed to plan your care.  Follow-up appointment: Keep your post-procedure follow-up evaluation appointment after the procedure (usually 2 weeks for most procedures, 6 weeks for radiofrequencies). DO NOT FORGET to bring you pain diary with you.   Expect: (What should I expect to see with my procedure?)  From numbing medicine (AKA: Local Anesthetics): Numbness or decrease in pain. You may also experience some weakness, which if present, could last for the duration of the local anesthetic.  Onset: Full effect within 15 minutes of injected.  Duration: It will depend on the type of local anesthetic used. On the average, 1 to 8 hours.   From steroids (Applies only if steroids were used): Decrease in swelling or inflammation. Once inflammation is improved, relief of the pain will follow.  Onset of benefits: Depends on the amount of swelling present. The more swelling, the longer it will take for the benefits to be seen. In some cases, up to 10 days.    Duration: Steroids will stay in the system x 2 weeks. Duration of benefits will depend on multiple posibilities including persistent irritating factors.  Side-effects: If present, they may typically last 2 weeks (the duration of the steroids).  Frequent: Cramps (if they occur, drink Gatorade and take over-the-counter Magnesium 450-500 mg  once to twice a day); water retention with temporary weight gain; increases in blood sugar; decreased immune system response; increased appetite.  Occasional: Facial flushing (red, warm cheeks); mood swings; menstrual changes.  Uncommon: Long-term decrease or suppression of natural hormones; bone thinning. (These are more common with higher doses or more frequent use. This is why we prefer that our patients avoid having any injection therapies in other practices.)   Very Rare: Severe mood changes; psychosis; aseptic necrosis.  From procedure: Some discomfort is to be expected once the numbing medicine wears off. This should be minimal if ice and heat are applied as instructed.  Call if: (When should I call?)  You experience numbness and weakness that gets worse with time, as opposed to wearing off.  New onset bowel or bladder incontinence. (Applies only to procedures done in the spine)  Emergency Numbers:  Durning business hours (Monday - Thursday, 8:00 AM - 4:00 PM) (Friday, 9:00 AM - 12:00 Noon): [phone]  After hours: [phone]  NOTE: If you are having a problem and are unable connect with, or to talk to a provider, then go to your nearest urgent care or emergency department. If the problem is serious and urgent, please call 911. ____________________________________________________________________________________________    

## 2021-06-08 NOTE — Progress Notes (Signed)
Patient's Name: Ryan [name]  MRN: [id number]  ?Referring Provider: Saralyn Pilar *  DOB: 1955/06/12  ?PCP: Smitty Cords, DO  DOS: 06/08/2021  ?Note by: Edward Jolly, MD  Service setting: Ambulatory outpatient  ?Specialty: Interventional Pain Management  Patient type: Established  ?Location: [facility] (AMB) Pain Management Facility  Visit type: Interventional Procedure  ? ?Primary Reason for Visit: Interventional Pain Management Treatment. ?CC: Back Pain ? ? ?Procedure:          Anesthesia, Analgesia, Anxiolysis:  ?Type: Lumbar Facet, Medial Branch Block(s) #1,  2023 ?Primary Purpose: Therapeutic ?Region: Posterolateral Lumbosacral Spine ?Level: L3, L4, L5,  Medial Branch Level(s). Injecting these levels blocks the L3-4, L4-5 lumbar facet joints. ?Laterality: Bilateral  Type: moderate sedation ?Route: Intravenous (IV) ?IV Access: Secured ?Sedation: Meaningful verbal contact was maintained at all times during the procedure  ?Local Anesthetic: Lidocaine 1-2% ? ?Position: Prone  ? ?Indications: ?1. Lumbar spondylosis   ?2. Lumbar facet arthropathy   ?3. Chronic pain syndrome   ? ?Pain Score: ?Pre-procedure: 8 /10 ?Post-procedure: 0-No pain/10 ? ?Pre-op Assessment:  ?Ryan [name] is a 66 y.o. (year old), male patient, seen today for interventional treatment. He  has a past surgical history that includes Eye surgery; Hernia repair; Transforaminal lumbar interbody fusion (tlif) with pedicle screw fixation 1 level (Right, 06/18/2014); Myringotomy with tube placement (Bilateral, 03/28/2015); Back surgery; Esophagogastroduodenoscopy (egd) with propofol (N/A, 10/24/2015); Cataract extraction w/PHACO (Left, 01/06/2016); Tonsillectomy; Colonoscopy with propofol (N/A, 04/19/2016); and Sinusotomy (02/28/2019). Ryan [name] has a current medication list which includes the following prescription(s): albuterol, alprazolam, aspirin, atorvastatin, gabapentin, lisinopril, sildenafil, tamsulosin, cetirizine, nicotine, and  risperidone, and the following Facility-Administered Medications: fentanyl. His primarily concern today is the Back Pain ? ? ?Initial Vital Signs:  ?Pulse/HCG Rate: 65ECG Heart Rate: (!) 57 ?Temp: (!) 97.1 ?F (36.2 ?C) ?Resp: 18 ?BP: (!) 151/85 ?SpO2: 98 % ? ?BMI: Estimated body mass index is 27.1 kg/m? as calculated from the following: ?  Height as of this encounter: 5\' 7"  (1.702 m). ?  Weight as of this encounter: 173 lb (78.5 kg). ? ?Risk Assessment: ?Allergies: Reviewed. He has No Known Allergies.  ?Allergy Precautions: None required ?Coagulopathies: Reviewed. None identified.  ?Blood-thinner therapy: None at this time ?Active Infection(s): Reviewed. None identified. Ryan [name] is afebrile ? ?Site Confirmation: Ryan [name] was asked to confirm the procedure and laterality before marking the site ?Procedure checklist: Completed ?Consent: Before the procedure and under the influence of no sedative(s), amnesic(s), or anxiolytics, the patient was informed of the treatment options, risks and possible complications. To fulfill our ethical and legal obligations, as recommended by the American Medical Association's Code of Ethics, I have informed the patient of my clinical impression; the nature and purpose of the treatment or procedure; the risks, benefits, and possible complications of the intervention; the alternatives, including doing nothing; the risk(s) and benefit(s) of the alternative treatment(s) or procedure(s); and the risk(s) and benefit(s) of doing nothing. ?The patient was provided information about the general risks and possible complications associated with the procedure. These may include, but are not limited to: failure to achieve desired goals, infection, bleeding, organ or nerve damage, allergic reactions, paralysis, and death. ?In addition, the patient was informed of those risks and complications associated to Spine-related procedures, such as failure to decrease pain; infection (i.e.: Meningitis,  epidural or intraspinal abscess); bleeding (i.e.: epidural hematoma, subarachnoid hemorrhage, or any other type of intraspinal or peri-dural bleeding); organ or nerve damage (i.e.: Any type of peripheral nerve, nerve  root, or spinal cord injury) with subsequent damage to sensory, motor, and/or autonomic systems, resulting in permanent pain, numbness, and/or weakness of one or several areas of the body; allergic reactions; (i.e.: anaphylactic reaction); and/or death. ?Furthermore, the patient was informed of those risks and complications associated with the medications. These include, but are not limited to: allergic reactions (i.e.: anaphylactic or anaphylactoid reaction(s)); adrenal axis suppression; blood sugar elevation that in diabetics may result in ketoacidosis or comma; water retention that in patients with history of congestive heart failure may result in shortness of breath, pulmonary edema, and decompensation with resultant heart failure; weight gain; swelling or edema; medication-induced neural toxicity; particulate matter embolism and blood vessel occlusion with resultant organ, and/or nervous system infarction; and/or aseptic necrosis of one or more joints. ?Finally, the patient was informed that Medicine is not an exact science; therefore, there is also the possibility of unforeseen or unpredictable risks and/or possible complications that may result in a catastrophic outcome. The patient indicated having understood very clearly. We have given the patient no guarantees and we have made no promises. Enough time was given to the patient to ask questions, all of which were answered to the patient's satisfaction. Ryan [name] has indicated that he wanted to continue with the procedure. ?Attestation: I, the ordering provider, attest that I have discussed with the patient the benefits, risks, side-effects, alternatives, likelihood of achieving goals, and potential problems during recovery for the procedure that  I have provided informed consent. ?Date  Time: 06/08/2021  8:47 AM ? ?Pre-Procedure Preparation:  ?Monitoring: As per clinic protocol. Respiration, ETCO2, SpO2, BP, heart rate and rhythm monitor placed and checked for adequate function ?Safety Precautions: Patient was assessed for positional comfort and pressure points before starting the procedure. ?Time-out: I initiated and conducted the "Time-out" before starting the procedure, as per protocol. The patient was asked to participate by confirming the accuracy of the "Time Out" information. Verification of the correct person, site, and procedure were performed and confirmed by me, the nursing staff, and the patient. "Time-out" conducted as per Joint Commission's Universal Protocol (UP.01.01.01). ?Time: [phone] ? ?Description of Procedure:          ?Laterality: Bilateral. The procedure was performed in identical fashion on both sides. ?Levels:  L3, L4, L5,  Medial Branch Level(s) ?Area Prepped: Posterior Lumbosacral Region ?Prepping solution: ChloraPrep (2% chlorhexidine gluconate and 70% isopropyl alcohol) ?Safety Precautions: Aspiration looking for blood return was conducted prior to all injections. At no point did we inject any substances, as a needle was being advanced. Before injecting, the patient was told to immediately notify me if he was experiencing any new onset of "ringing in the ears, or metallic taste in the mouth". No attempts were made at seeking any paresthesias. Safe injection practices and needle disposal techniques used. Medications properly checked for expiration dates. SDV (single dose vial) medications used. After the completion of the procedure, all disposable equipment used was discarded in the proper designated medical waste containers. ?Local Anesthesia: Protocol guidelines were followed. The patient was positioned over the fluoroscopy table. The area was prepped in the usual manner. The time-out was completed. The target area was identified  using fluoroscopy. A 12-in long, straight, sterile hemostat was used with fluoroscopic guidance to locate the targets for each level blocked. Once located, the skin was marked with an approved surgical skin marker.

## 2021-06-08 NOTE — Progress Notes (Signed)
Safety precautions to be maintained throughout the outpatient stay will include: orient to surroundings, keep bed in low position, maintain call bell within reach at all times, provide assistance with transfer out of bed and ambulation.  

## 2021-06-09 ENCOUNTER — Telehealth: Payer: Self-pay | Admitting: *Deleted

## 2021-06-09 NOTE — Telephone Encounter (Signed)
Called for post procedure check. No answer and unable to leave VM. ?

## 2021-07-20 ENCOUNTER — Encounter: Payer: Self-pay | Admitting: Student in an Organized Health Care Education/Training Program

## 2021-07-21 ENCOUNTER — Ambulatory Visit
Payer: Medicare Other | Attending: Student in an Organized Health Care Education/Training Program | Admitting: Student in an Organized Health Care Education/Training Program

## 2021-07-21 ENCOUNTER — Encounter: Payer: Self-pay | Admitting: Student in an Organized Health Care Education/Training Program

## 2021-07-21 DIAGNOSIS — G894 Chronic pain syndrome: Secondary | ICD-10-CM | POA: Diagnosis not present

## 2021-07-21 DIAGNOSIS — M47816 Spondylosis without myelopathy or radiculopathy, lumbar region: Secondary | ICD-10-CM

## 2021-07-21 DIAGNOSIS — M5137 Other intervertebral disc degeneration, lumbosacral region: Secondary | ICD-10-CM | POA: Diagnosis not present

## 2021-07-30 ENCOUNTER — Emergency Department: Payer: Medicare Other

## 2021-07-30 ENCOUNTER — Other Ambulatory Visit: Payer: Self-pay

## 2021-07-30 ENCOUNTER — Emergency Department
Admission: EM | Admit: 2021-07-30 | Discharge: 2021-07-30 | Disposition: A | Discharge status: Home / Self Care | Payer: Medicare Other | Attending: Emergency Medicine | Admitting: Emergency Medicine

## 2021-07-30 DIAGNOSIS — F17200 Nicotine dependence, unspecified, uncomplicated: Secondary | ICD-10-CM | POA: Diagnosis not present

## 2021-07-30 DIAGNOSIS — M533 Sacrococcygeal disorders, not elsewhere classified: Secondary | ICD-10-CM | POA: Diagnosis not present

## 2021-07-30 DIAGNOSIS — J449 Chronic obstructive pulmonary disease, unspecified: Secondary | ICD-10-CM | POA: Diagnosis not present

## 2021-07-30 DIAGNOSIS — W108XXA Fall (on) (from) other stairs and steps, initial encounter: Secondary | ICD-10-CM | POA: Diagnosis not present

## 2021-07-30 DIAGNOSIS — Y9301 Activity, walking, marching and hiking: Secondary | ICD-10-CM | POA: Insufficient documentation

## 2021-07-30 DIAGNOSIS — M47816 Spondylosis without myelopathy or radiculopathy, lumbar region: Secondary | ICD-10-CM | POA: Diagnosis not present

## 2021-07-30 DIAGNOSIS — Z043 Encounter for examination and observation following other accident: Secondary | ICD-10-CM | POA: Diagnosis not present

## 2021-07-30 DIAGNOSIS — J45909 Unspecified asthma, uncomplicated: Secondary | ICD-10-CM | POA: Insufficient documentation

## 2021-07-30 DIAGNOSIS — M545 Low back pain, unspecified: Secondary | ICD-10-CM | POA: Diagnosis not present

## 2021-07-30 DIAGNOSIS — I10 Essential (primary) hypertension: Secondary | ICD-10-CM | POA: Diagnosis not present

## 2021-07-30 DIAGNOSIS — W19XXXA Unspecified fall, initial encounter: Secondary | ICD-10-CM

## 2021-07-30 DIAGNOSIS — Z720 Tobacco use: Secondary | ICD-10-CM

## 2021-07-30 MED ORDER — ACETAMINOPHEN 500 MG PO TABS
1000.0000 mg | ORAL_TABLET | Freq: Once | ORAL | Status: AC
  Administered 2021-07-30: 1000 mg via ORAL
  Filled 2021-07-30: qty 2

## 2021-07-30 MED ORDER — LIDOCAINE 5 % EX PTCH
1.0000 | MEDICATED_PATCH | CUTANEOUS | Status: DC
  Administered 2021-07-30: 1 via TRANSDERMAL
  Filled 2021-07-30: qty 1

## 2021-07-30 NOTE — ED Notes (Signed)
See triage note  Presents s/p fall  States he fell backwards from step.  Landed on buttocks. Having lower back pain

## 2021-07-30 NOTE — ED Provider Notes (Signed)
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  Patient states this started after he fell onto his butt 2 days ago.  States he missed a step walking up 2 steps and fell from a height of about 3 steps onto his sacral area.  He states he did not hit his head or any other area of his body and has no other pain other than his lower back.  He has been able to ambulate without much difficulty.  He denies any weakness or numbness, incontinence or other recent sick symptoms such as chest pain, cough, shortness of breath, nausea, vomiting or diarrhea.  States he has been taking Tylenol but does not feel this is helped much.  He did take his gabapentin this morning.  Patient states he is not on any blood thinners and cannot take salicylates or NSAIDs due to issues with GI bleeding in the past.      Physical Exam  Triage Vital Signs: ED Triage Vitals [07/30/21 1053]  Enc Vitals Group     BP 136/80     Pulse Rate 67     Resp 18     Temp 98.7 F (37.1 C)     Temp Source Oral     SpO2 97 %     Weight      Height      Head Circumference      Peak Flow      Pain Score      Pain Loc      Pain Edu?      Excl. in GC?     Most recent vital signs: Vitals:   07/30/21 1053  BP: 136/80  Pulse: 67  Resp: 18  Temp: 98.7 F (37.1 C)  SpO2: 97%    General: Awake, no distress.  CV:  Good peripheral  perfusion.  2+ radial pulses. Resp:  Normal effort.  Clear bilaterally. Abd:  No distention.  Soft. Other:  Previous lumbar scars are visible in the L-spine.  There is some tenderness along the L-spine and paralumbar muscles.  There are no other overlying skin changes.  There is no tenderness along the C or T-spine.  Patient is symmetric strength throughout the bilateral lower extremities.  1+ bilateral patellar reflexes.  Sensation is intact light touch throughout the bilateral lower extremities.   ED Results / Procedures / Treatments  Labs (all labs ordered are listed, but only abnormal results are displayed) Labs Reviewed - No data to display   EKG   RADIOLOGY  X-ray of the L-spine and sacrum and coccyx on my interpretation without evidence of acute fracture dislocation.  I see patient's prior fusion surgery and evidence of arthritis.  I also reviewed radiology's interpretation.   PROCEDURES:  Critical Care performed: No  Procedures    MEDICATIONS ORDERED IN ED: Medications  lidocaine (LIDODERM) 5 % 1  patch (has no administration in time range)  acetaminophen (TYLENOL) tablet 1,000 mg (has no administration in time range)     IMPRESSION / MDM / ASSESSMENT AND PLAN / ED COURSE  I reviewed the triage vital signs and the nursing notes. Patient's presentation is most consistent with acute presentation with potential threat to life or bodily function.                               Differential diagnosis includes, but is not limited to contusion, muscle sprain, fracture with lower suspicion for acute cord compression based on the fact that he is neuro intact and I have a low suspicion of this time for acute infectious process.  X-ray of the L-spine and sacrum and coccyx on my interpretation without evidence of acute fracture dislocation.  I see patient's prior fusion surgery and evidence of arthritis.  I also reviewed radiology's interpretation.  I suspect likely contusion.   Patient is otherwise neurovascular intact and have a low suspicion for other immediate life-threatening process.  Discussed close outpatient PCP follow-up.  Discussed returning for any new or worsening of symptoms.  Counseled on tobacco cessation.  Discharged in stable condition.  Strict and precautions advised and discussed.        FINAL CLINICAL IMPRESSION(S) / ED DIAGNOSES   Final diagnoses:  Fall, initial encounter  Acute midline low back pain, unspecified whether sciatica present  Tobacco abuse     Rx / DC Orders   ED Discharge Orders     None        Note:  This document was prepared using Dragon voice recognition software and may include unintentional dictation errors.   Gilles Chiquito, MD 07/30/21 [phone]

## 2021-07-30 NOTE — ED Triage Notes (Signed)
Pt states he lost his balance going up some steps on Monday and fell back on his buttock and is having lower back pain. Denies any numbness or tingling or other sx

## 2021-09-04 ENCOUNTER — Other Ambulatory Visit: Payer: Self-pay | Admitting: Family Medicine

## 2021-09-04 DIAGNOSIS — E782 Mixed hyperlipidemia: Secondary | ICD-10-CM

## 2021-09-04 NOTE — Telephone Encounter (Signed)
Requested medications are due for refill today.  yes  Requested medications are on the active medications list.  yes  Last refill. 08/06/2020 #90 3 refills  Future visit scheduled.   no  Notes to clinic.  Labs are expired.    Requested Prescriptions  Pending Prescriptions Disp Refills   atorvastatin (LIPITOR) 80 MG tablet [Pharmacy Med Name: Atorvastatin Calcium 80 MG Oral Tablet] 90 tablet 0    Sig: TAKE 1 TABLET BY MOUTH AT BEDTIME     Cardiovascular:  Antilipid - Statins Failed - 09/04/2021 11:38 AM      Failed - Valid encounter within last 12 months    Recent Outpatient Visits           7 months ago Welcome to [employer] preventive visit   [facility] [place], Netta Neat, DO   1 year ago Essential hypertension   [facility] [place], Netta Neat, DO   1 year ago Loose stools   [facility] Caro Laroche, DO   1 year ago Pain and swelling of right knee   [facility] Smitty Cords, DO   1 year ago Essential hypertension   [facility] [place], Netta Neat, DO              Failed - Lipid Panel in normal range within the last 12 months    Cholesterol  Date Value Ref Range Status  09/10/2019 185 0 - 200 mg/dL Final   LDL Cholesterol  Date Value Ref Range Status  09/10/2019 123 (H) 0 - 99 mg/dL Final    Comment:           Total Cholesterol/HDL:CHD Risk Coronary Heart Disease Risk Table                     Men   Women  1/2 Average Risk   3.4   3.3  Average Risk       5.0   4.4  2 X Average Risk   9.6   7.1  3 X Average Risk  23.4   11.0        Use the calculated Patient Ratio above and the CHD Risk Table to determine the patient's CHD Risk.        ATP III CLASSIFICATION (LDL):  <100     mg/dL   Optimal  235-361  mg/dL   Near or Above                    Optimal  130-159  mg/dL   Borderline  443-154  mg/dL   High  >008     mg/dL   Very High Performed at  [facility], [address] [place], [place] [address]    HDL  Date Value Ref Range Status  09/10/2019 38 (L) >40 mg/dL Final   Triglycerides  Date Value Ref Range Status  09/10/2019 122 <150 mg/dL Final         Passed - Patient is not pregnant

## 2021-09-14 DIAGNOSIS — H2511 Age-related nuclear cataract, right eye: Secondary | ICD-10-CM | POA: Diagnosis not present

## 2021-09-16 ENCOUNTER — Ambulatory Visit
Payer: Medicare Other | Attending: Student in an Organized Health Care Education/Training Program | Admitting: Student in an Organized Health Care Education/Training Program

## 2021-09-16 ENCOUNTER — Encounter: Payer: Self-pay | Admitting: Student in an Organized Health Care Education/Training Program

## 2021-09-16 ENCOUNTER — Other Ambulatory Visit: Payer: Self-pay

## 2021-09-16 ENCOUNTER — Ambulatory Visit
Admission: RE | Admit: 2021-09-16 | Discharge: 2021-09-16 | Disposition: A | Discharge status: Home / Self Care | Payer: Medicare Other | Source: Ambulatory Visit | Attending: Student in an Organized Health Care Education/Training Program | Admitting: Student in an Organized Health Care Education/Training Program

## 2021-09-16 DIAGNOSIS — G894 Chronic pain syndrome: Secondary | ICD-10-CM | POA: Diagnosis not present

## 2021-09-16 DIAGNOSIS — M47816 Spondylosis without myelopathy or radiculopathy, lumbar region: Secondary | ICD-10-CM | POA: Insufficient documentation

## 2021-09-16 DIAGNOSIS — M5137 Other intervertebral disc degeneration, lumbosacral region: Secondary | ICD-10-CM | POA: Diagnosis not present

## 2021-09-16 MED ORDER — ROPIVACAINE HCL 2 MG/ML IJ SOLN
9.0000 mL | Freq: Once | INTRAMUSCULAR | Status: AC
  Administered 2021-09-16: 9 mL via PERINEURAL

## 2021-09-16 MED ORDER — DEXAMETHASONE SODIUM PHOSPHATE 10 MG/ML IJ SOLN
10.0000 mg | Freq: Once | INTRAMUSCULAR | Status: AC
  Administered 2021-09-16: 10 mg

## 2021-09-16 MED ORDER — LACTATED RINGERS IV SOLN
Freq: Once | INTRAVENOUS | Status: AC
Start: 2021-09-16 — End: 2021-09-16

## 2021-09-16 MED ORDER — ROPIVACAINE HCL 2 MG/ML IJ SOLN
INTRAMUSCULAR | Status: AC
  Filled 2021-09-16: qty 20

## 2021-09-16 MED ORDER — MIDAZOLAM HCL 5 MG/5ML IJ SOLN
0.5000 mg | Freq: Once | INTRAMUSCULAR | Status: AC
  Administered 2021-09-16: 2 mg via INTRAVENOUS

## 2021-09-16 MED ORDER — LIDOCAINE HCL 2 % IJ SOLN
INTRAMUSCULAR | Status: AC
  Filled 2021-09-16: qty 20

## 2021-09-16 MED ORDER — DEXAMETHASONE SODIUM PHOSPHATE 10 MG/ML IJ SOLN
INTRAMUSCULAR | Status: AC
  Filled 2021-09-16: qty 2

## 2021-09-16 MED ORDER — FENTANYL CITRATE (PF) 100 MCG/2ML IJ SOLN
INTRAMUSCULAR | Status: AC
  Filled 2021-09-16: qty 2

## 2021-09-16 MED ORDER — LIDOCAINE HCL 2 % IJ SOLN
20.0000 mL | Freq: Once | INTRAMUSCULAR | Status: AC
Start: 2021-09-16 — End: 2021-09-16
  Administered 2021-09-16: 400 mg

## 2021-09-16 MED ORDER — MIDAZOLAM HCL 5 MG/5ML IJ SOLN
INTRAMUSCULAR | Status: AC
  Filled 2021-09-16: qty 5

## 2021-09-16 MED ORDER — FENTANYL CITRATE (PF) 100 MCG/2ML IJ SOLN
25.0000 ug | INTRAMUSCULAR | Status: DC | PRN
  Administered 2021-09-16: 100 ug via INTRAVENOUS

## 2021-09-16 NOTE — Progress Notes (Signed)
Safety precautions to be maintained throughout the outpatient stay will include: orient to surroundings, keep bed in low position, maintain call bell within reach at all times, provide assistance with transfer out of bed and ambulation.  

## 2021-09-16 NOTE — Progress Notes (Signed)
Patient's Name: Ryan [name]  MRN: [id number]  Referring Provider: [name], MD  DOB: January 14, 1956  PCP: Olin Hauser, DO  DOS: 09/16/2021  Note by: [name], MD  Service setting: Ambulatory outpatient  Specialty: Interventional Pain Management  Patient type: Established  Location: [facility] (AMB) Pain Management Facility  Visit type: Interventional Procedure   Primary Reason for Visit: Interventional Pain Management Treatment. CC: Back Pain (Lumbar bilateral )   Procedure:          Anesthesia, Analgesia, Anxiolysis:  Type: Lumbar Facet, Medial Branch Block(s) #2,  2023 Primary Purpose: Therapeutic Region: Posterolateral Lumbosacral Spine Level: L3, L4, L5,  Medial Branch Level(s). Injecting these levels blocks the L3-4, L4-5 lumbar facet joints. Laterality: Bilateral  Type: moderate sedation Route: Intravenous (IV) IV Access: Secured Sedation: Meaningful verbal contact was maintained at all times during the procedure  Local Anesthetic: Lidocaine 1-2%  Position: Prone   Indications: 1. Lumbar spondylosis   2. Lumbar facet arthropathy   3. DDD (degenerative disc disease), lumbosacral   4. Chronic pain syndrome    Pain Score: Pre-procedure: 7 /10 Post-procedure: 2 /10  Pre-op Assessment:  Mr. Manjarres is a 66 y.o. (year old), male patient, seen today for interventional treatment. He  has a past surgical history that includes Eye surgery; Hernia repair; Transforaminal lumbar interbody fusion (tlif) with pedicle screw fixation 1 level (Right, 06/18/2014); Myringotomy with tube placement (Bilateral, 03/28/2015); Back surgery; Esophagogastroduodenoscopy (egd) with propofol (N/A, 10/24/2015); Cataract extraction w/PHACO (Left, 01/06/2016); Tonsillectomy; Colonoscopy with propofol (N/A, 04/19/2016); and Sinusotomy (02/28/2019). Mr. Boitano has a current medication list which includes the following prescription(s): albuterol, alprazolam, aspirin ec, atorvastatin, cetirizine, gabapentin,  lisinopril, nicotine, sildenafil, tamsulosin, and risperidone, and the following Facility-Administered Medications: fentanyl and lactated ringers. His primarily concern today is the Back Pain (Lumbar bilateral )   Initial Vital Signs:  Pulse/HCG Rate: 70ECG Heart Rate: 68 (NSR) Temp: (!) 96.9 F (36.1 C) Resp: 16 BP: (!) 176/86 SpO2: 98 %  BMI: Estimated body mass index is 27.92 kg/m as calculated from the following:   Height as of this encounter: 5\' 6"  (1.676 m).   Weight as of this encounter: 173 lb (78.5 kg).  Risk Assessment: Allergies: Reviewed. He has No Known Allergies.  Allergy Precautions: None required Coagulopathies: Reviewed. None identified.  Blood-thinner therapy: None at this time Active Infection(s): Reviewed. None identified. Mr. Baerwald is afebrile  Site Confirmation: Mr. Thoresen was asked to confirm the procedure and laterality before marking the site Procedure checklist: Completed Consent: Before the procedure and under the influence of no sedative(s), amnesic(s), or anxiolytics, the patient was informed of the treatment options, risks and possible complications. To fulfill our ethical and legal obligations, as recommended by the American Medical Association's Code of Ethics, I have informed the patient of my clinical impression; the nature and purpose of the treatment or procedure; the risks, benefits, and possible complications of the intervention; the alternatives, including doing nothing; the risk(s) and benefit(s) of the alternative treatment(s) or procedure(s); and the risk(s) and benefit(s) of doing nothing. The patient was provided information about the general risks and possible complications associated with the procedure. These may include, but are not limited to: failure to achieve desired goals, infection, bleeding, organ or nerve damage, allergic reactions, paralysis, and death. In addition, the patient was informed of those risks and complications associated  to Spine-related procedures, such as failure to decrease pain; infection (i.e.: Meningitis, epidural or intraspinal abscess); bleeding (i.e.: epidural hematoma, subarachnoid hemorrhage, or any  other type of intraspinal or peri-dural bleeding); organ or nerve damage (i.e.: Any type of peripheral nerve, nerve root, or spinal cord injury) with subsequent damage to sensory, motor, and/or autonomic systems, resulting in permanent pain, numbness, and/or weakness of one or several areas of the body; allergic reactions; (i.e.: anaphylactic reaction); and/or death. Furthermore, the patient was informed of those risks and complications associated with the medications. These include, but are not limited to: allergic reactions (i.e.: anaphylactic or anaphylactoid reaction(s)); adrenal axis suppression; blood sugar elevation that in diabetics may result in ketoacidosis or comma; water retention that in patients with history of congestive heart failure may result in shortness of breath, pulmonary edema, and decompensation with resultant heart failure; weight gain; swelling or edema; medication-induced neural toxicity; particulate matter embolism and blood vessel occlusion with resultant organ, and/or nervous system infarction; and/or aseptic necrosis of one or more joints. Finally, the patient was informed that Medicine is not an exact science; therefore, there is also the possibility of unforeseen or unpredictable risks and/or possible complications that may result in a catastrophic outcome. The patient indicated having understood very clearly. We have given the patient no guarantees and we have made no promises. Enough time was given to the patient to ask questions, all of which were answered to the patient's satisfaction. Mr. Diodato has indicated that he wanted to continue with the procedure. Attestation: I, the ordering provider, attest that I have discussed with the patient the benefits, risks, side-effects, alternatives,  likelihood of achieving goals, and potential problems during recovery for the procedure that I have provided informed consent. Date  Time: 09/16/2021  8:48 AM  Pre-Procedure Preparation:  Monitoring: As per clinic protocol. Respiration, ETCO2, SpO2, BP, heart rate and rhythm monitor placed and checked for adequate function Safety Precautions: Patient was assessed for positional comfort and pressure points before starting the procedure. Time-out: I initiated and conducted the "Time-out" before starting the procedure, as per protocol. The patient was asked to participate by confirming the accuracy of the "Time Out" information. Verification of the correct person, site, and procedure were performed and confirmed by me, the nursing staff, and the patient. "Time-out" conducted as per Joint Commission's Universal Protocol (UP.01.01.01). Time: 0935  Description of Procedure:          Laterality: Bilateral. The procedure was performed in identical fashion on both sides. Levels:  L3, L4, L5,  Medial Branch Level(s) Area Prepped: Posterior Lumbosacral Region Prepping solution: ChloraPrep (2% chlorhexidine gluconate and 70% isopropyl alcohol) Safety Precautions: Aspiration looking for blood return was conducted prior to all injections. At no point did we inject any substances, as a needle was being advanced. Before injecting, the patient was told to immediately notify me if he was experiencing any new onset of "ringing in the ears, or metallic taste in the mouth". No attempts were made at seeking any paresthesias. Safe injection practices and needle disposal techniques used. Medications properly checked for expiration dates. SDV (single dose vial) medications used. After the completion of the procedure, all disposable equipment used was discarded in the proper designated medical waste containers. Local Anesthesia: Protocol guidelines were followed. The patient was positioned over the fluoroscopy table. The area  was prepped in the usual manner. The time-out was completed. The target area was identified using fluoroscopy. A 12-in long, straight, sterile hemostat was used with fluoroscopic guidance to locate the targets for each level blocked. Once located, the skin was marked with an approved surgical skin marker. Once all sites were marked,  the skin (epidermis, dermis, and hypodermis), as well as deeper tissues (fat, connective tissue and muscle) were infiltrated with a Lonardo amount of a short-acting local anesthetic, loaded on a 10cc syringe with a 25G, 1.5-in  Needle. An appropriate amount of time was allowed for local anesthetics to take effect before proceeding to the next step. Local Anesthetic: Lidocaine 2.0% The unused portion of the local anesthetic was discarded in the proper designated containers. Technical explanation of process:   L3 Medial Branch Nerve Block (MBB): The target area for the L3 medial branch is at the junction of the postero-lateral aspect of the superior articular process and the superior, posterior, and medial edge of the transverse process of L4. Under fluoroscopic guidance, a Quincke needle was inserted until contact was made with os over the superior postero-lateral aspect of the pedicular shadow (target area). After negative aspiration for blood,1 cc  mL of the nerve block solution was injected without difficulty or complication. The needle was removed intact. L4 Medial Branch Nerve Block (MBB): The target area for the L4 medial branch is at the junction of the postero-lateral aspect of the superior articular process and the superior, posterior, and medial edge of the transverse process of L5. Under fluoroscopic guidance, a Quincke needle was inserted until contact was made with os over the superior postero-lateral aspect of the pedicular shadow (target area). After negative aspiration for blood,1cc mL of the nerve block solution was injected without difficulty or complication. The  needle was removed intact. L5 Medial Branch Nerve Block (MBB): The target area for the L5 medial branch is at the junction of the postero-lateral aspect of the superior articular process and the superior, posterior, and medial edge of the sacral ala. Under fluoroscopic guidance, a Quincke needle was inserted until contact was made with os over the superior postero-lateral aspect of the pedicular shadow (target area). After negative aspiration for blood,1cc  mL of the nerve block solution was injected without difficulty or complication. The needle was removed intact.   Nerve block solution: 10 cc solution made of 8 cc of 0.2% ropivacaine, 2 cc of Decadron 10 mg/cc.  1-1.5 cc injected at each level above bilaterally  Procedural Needles: 22-gauge, 3.5-inch, Quincke needles used for all levels.  Once the entire procedure was completed, the treated area was cleaned, making sure to leave some of the prepping solution back to take advantage of its long term bactericidal properties.   Illustration of the posterior view of the lumbar spine and the posterior neural structures. Laminae of L2 through S1 are labeled. DPRL5, dorsal primary ramus of L5; DPRS1, dorsal primary ramus of S1; DPR3, dorsal primary ramus of L3; FJ, facet (zygapophyseal) joint L3-L4; I, inferior articular process of L4; LB1, lateral branch of dorsal primary ramus of L1; IAB, inferior articular branches from L3 medial branch (supplies L4-L5 facet joint); IBP, intermediate branch plexus; MB3, medial branch of dorsal primary ramus of L3; NR3, third lumbar nerve root; S, superior articular process of L5; SAB, superior articular branches from L4 (supplies L4-5 facet joint also); TP3, transverse process of L3.  Vitals:   09/16/21 0947 09/16/21 0957 09/16/21 1006 09/16/21 1016  BP: (!) 147/93 (!) 162/92 (!) 160/99 (!) 156/98  Pulse:      Resp: 16 14 18 13   Temp:      TempSrc:      SpO2: 97% 98% 99% 99%  Weight:      Height:         Start  Time:  0935 hrs. End Time: 0947 hrs.  Imaging Guidance (Spinal):          Type of Imaging Technique: Fluoroscopy Guidance (Spinal) Indication(s): Assistance in needle guidance and placement for procedures requiring needle placement in or near specific anatomical locations not easily accessible without such assistance. Exposure Time: Please see nurses notes. Contrast: None used. Fluoroscopic Guidance: I was personally present during the use of fluoroscopy. "Tunnel Vision Technique" used to obtain the best possible view of the target area. Parallax error corrected before commencing the procedure. "Direction-depth-direction" technique used to introduce the needle under continuous pulsed fluoroscopy. Once target was reached, antero-posterior, oblique, and lateral fluoroscopic projection used confirm needle placement in all planes. Images permanently stored in EMR. Interpretation: No contrast injected. I personally interpreted the imaging intraoperatively. Adequate needle placement confirmed in multiple planes. Permanent images saved into the patient's record.   Post-operative Assessment:  Post-procedure Vital Signs:  Pulse/HCG Rate: 7071 Temp: (!) 96.9 F (36.1 C) Resp: 13 BP: (!) 156/98 SpO2: 99 %  EBL: None  Complications: No immediate post-treatment complications observed by team, or reported by patient.  Note: The patient tolerated the entire procedure well. A repeat set of vitals were taken after the procedure and the patient was kept under observation following institutional policy, for this type of procedure. Post-procedural neurological assessment was performed, showing return to baseline, prior to discharge. The patient was provided with post-procedure discharge instructions, including a section on how to identify potential problems. Should any problems arise concerning this procedure, the patient was given instructions to immediately contact us, at any time, without hesitation. In any  case, we plan to contact the patient by telephone for a follow-up status report regarding this interventional procedure.  Comments:  No additional relevant information.  Plan of Care   Imaging Orders         DG PAIN CLINIC C-ARM 1-60 MIN NO REPORT     Medications ordered for procedure: Meds ordered this encounter  Medications   lidocaine (XYLOCAINE) 2 % (with pres) injection 400 mg   lactated ringers infusion   midazolam (VERSED) 5 MG/5ML injection 0.5-2 mg    Make sure Flumazenil is available in the pyxis when using this medication. If oversedation occurs, administer 0.2 mg IV over 15 sec. If after 45 sec no response, administer 0.2 mg again over 1 min; may repeat at 1 min intervals; not to exceed 4 doses (1 mg)   fentaNYL (SUBLIMAZE) injection 25-50 mcg    Make sure Narcan is available in the pyxis when using this medication. In the event of respiratory depression (RR< 8/min): Titrate NARCAN (naloxone) in increments of 0.1 to 0.2 mg IV at 2-3 minute intervals, until desired degree of reversal.   dexamethasone (DECADRON) injection 10 mg   dexamethasone (DECADRON) injection 10 mg   ropivacaine (PF) 2 mg/mL (0.2%) (NAROPIN) injection 9 mL   ropivacaine (PF) 2 mg/mL (0.2%) (NAROPIN) injection 9 mL   Medications administered: We administered lidocaine, lactated ringers, midazolam, fentaNYL, dexamethasone, dexamethasone, ropivacaine (PF) 2 mg/mL (0.2%), and ropivacaine (PF) 2 mg/mL (0.2%).  See the medical record for exact dosing, route, and time of administration.  Disposition: Discharge home  Discharge Date & Time: 09/16/2021; 1020 hrs.   Physician-requested Follow-up: Return in about 8 weeks (around 11/11/2021) for Post Procedure Evaluation, virtual.  Future Appointments  Date Time Provider Department Center  11/11/2021  3:00 PM Edward Jolly, MD [facility] None    Primary Care Physician: Smitty Cords, DO Location: [facility] Note by:  [name], MD Date: 09/16/2021; Time: 10:21 AM  Disclaimer:  Medicine is not an exact science. The only guarantee in medicine is that nothing is guaranteed. It is important to note that the decision to proceed with this intervention was based on the information collected from the patient. The Data and conclusions were drawn from the patient's questionnaire, the interview, and the physical examination. Because the information was provided in large part by the patient, it cannot be guaranteed that it has not been purposely or unconsciously manipulated. Every effort has been made to obtain as much relevant data as possible for this evaluation. It is important to note that the conclusions that lead to this procedure are derived in large part from the available data. Always take into account that the treatment will also be dependent on availability of resources and existing treatment guidelines, considered by other Pain Management Practitioners as being common knowledge and practice, at the time of the intervention. For Medico-Legal purposes, it is also important to point out that variation in procedural techniques and pharmacological choices are the acceptable norm. The indications, contraindications, technique, and results of the above procedure should only be interpreted and judged by a Board-Certified Interventional Pain Specialist with extensive familiarity and expertise in the same exact procedure and technique.

## 2021-09-16 NOTE — Patient Instructions (Signed)

## 2021-09-17 ENCOUNTER — Ambulatory Visit: Payer: Self-pay | Admitting: *Deleted

## 2021-09-17 ENCOUNTER — Telehealth: Payer: Self-pay | Admitting: *Deleted
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
## 2021-09-17 NOTE — Telephone Encounter (Signed)
No available appts today. He needs to keep his scheduled appt for tomorrow with Denny Peon.

## 2021-09-17 NOTE — Telephone Encounter (Signed)
No problems post procedure. 

## 2021-09-18 ENCOUNTER — Encounter: Payer: Self-pay | Admitting: Physician Assistant

## 2021-09-18 ENCOUNTER — Ambulatory Visit (INDEPENDENT_AMBULATORY_CARE_PROVIDER_SITE_OTHER): Payer: Medicare Other | Admitting: Physician Assistant

## 2021-09-18 VITALS — BP 148/85 | HR 100 | Ht 66.0 in | Wt 166.0 lb

## 2021-09-18 DIAGNOSIS — H9202 Otalgia, left ear: Secondary | ICD-10-CM | POA: Diagnosis not present

## 2021-09-18 DIAGNOSIS — H5712 Ocular pain, left eye: Secondary | ICD-10-CM | POA: Diagnosis not present

## 2021-09-18 DIAGNOSIS — H579 Unspecified disorder of eye and adnexa: Secondary | ICD-10-CM | POA: Diagnosis not present
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
## 2021-09-18 NOTE — Patient Instructions (Signed)
If you are still having eye pain or feel like there is something in your eye please go to the ED or urgent care for them to evaluate your eyes. Usually there is an eye doctor on call at the ED and they have the equipment to remove foreign objects from your eyes safely.  You can use sterile eye flushes to further wash out your eyes and lubricating eye drops to help with discomfort Please let us know if you have any drainage, bleeding or vision changes so we can advise you further.

## 2021-10-02 ENCOUNTER — Other Ambulatory Visit: Payer: Self-pay | Admitting: Family Medicine

## 2021-10-02 DIAGNOSIS — I10 Essential (primary) hypertension: Secondary | ICD-10-CM

## 2021-10-05 ENCOUNTER — Emergency Department
Admission: EM | Admit: 2021-10-05 | Discharge: 2021-10-05 | Disposition: A | Discharge status: Home / Self Care | Payer: Medicare Other | Attending: Emergency Medicine | Admitting: Emergency Medicine

## 2021-10-05 ENCOUNTER — Other Ambulatory Visit: Payer: Self-pay

## 2021-10-05 ENCOUNTER — Emergency Department: Payer: Medicare Other

## 2021-10-05 DIAGNOSIS — R1110 Vomiting, unspecified: Secondary | ICD-10-CM | POA: Diagnosis not present

## 2021-10-05 DIAGNOSIS — L03311 Cellulitis of abdominal wall: Secondary | ICD-10-CM | POA: Diagnosis not present

## 2021-10-05 DIAGNOSIS — N189 Chronic kidney disease, unspecified: Secondary | ICD-10-CM | POA: Diagnosis not present

## 2021-10-05 DIAGNOSIS — R109 Unspecified abdominal pain: Secondary | ICD-10-CM | POA: Diagnosis not present

## 2021-10-05 DIAGNOSIS — J449 Chronic obstructive pulmonary disease, unspecified: Secondary | ICD-10-CM | POA: Diagnosis not present

## 2021-10-05 DIAGNOSIS — I700 Atherosclerosis of aorta: Secondary | ICD-10-CM | POA: Diagnosis not present

## 2021-10-05 LAB — CBC WITH DIFFERENTIAL/PLATELET
Abs Immature Granulocytes: 0.03 10*3/uL (ref 0.00–0.07)
Basophils Absolute: 0.1 10*3/uL (ref 0.0–0.1)
Basophils Relative: 1 %
Eosinophils Absolute: 0.2 10*3/uL (ref 0.0–0.5)
Eosinophils Relative: 3 %
HCT: 45.5 % (ref 39.0–52.0)
Hemoglobin: 14.8 g/dL (ref 13.0–17.0)
Immature Granulocytes: 0 %
Lymphocytes Relative: 25 %
Lymphs Abs: 2.3 10*3/uL (ref 0.7–4.0)
MCH: 32.2 pg (ref 26.0–34.0)
MCHC: 32.5 g/dL (ref 30.0–36.0)
MCV: 98.9 fL (ref 80.0–100.0)
Monocytes Absolute: 0.5 10*3/uL (ref 0.1–1.0)
Monocytes Relative: 6 %
Neutro Abs: 5.9 10*3/uL (ref 1.7–7.7)
Neutrophils Relative %: 65 %
Platelets: 242 10*3/uL (ref 150–400)
RBC: 4.6 MIL/uL (ref 4.22–5.81)
RDW: 13.8 % (ref 11.5–15.5)
WBC: 9 10*3/uL (ref 4.0–10.5)
nRBC: 0 % (ref 0.0–0.2)

## 2021-10-05 LAB — URINALYSIS, ROUTINE W REFLEX MICROSCOPIC
Bilirubin Urine: NEGATIVE
Glucose, UA: NEGATIVE mg/dL
Hgb urine dipstick: NEGATIVE
Ketones, ur: NEGATIVE mg/dL
Leukocytes,Ua: NEGATIVE
Nitrite: NEGATIVE
Protein, ur: NEGATIVE mg/dL
Specific Gravity, Urine: 1.01 (ref 1.005–1.030)
pH: 5 (ref 5.0–8.0)

## 2021-10-05 LAB — COMPREHENSIVE METABOLIC PANEL
ALT: 15 U/L (ref 0–44)
AST: 17 U/L (ref 15–41)
Albumin: 4.1 g/dL (ref 3.5–5.0)
Alkaline Phosphatase: 99 U/L (ref 38–126)
Anion gap: 7 (ref 5–15)
BUN: 10 mg/dL (ref 8–23)
CO2: 24 mmol/L (ref 22–32)
Calcium: 9.1 mg/dL (ref 8.9–10.3)
Chloride: 110 mmol/L (ref 98–111)
Creatinine, Ser: 0.89 mg/dL (ref 0.61–1.24)
GFR, Estimated: >60 mL/min (ref >60)
Glucose, Bld: 103 mg/dL — ABNORMAL HIGH (ref 70–99)
Potassium: 4 mmol/L (ref 3.5–5.1)
Sodium: 141 mmol/L (ref 135–145)
Total Bilirubin: 0.3 mg/dL (ref 0.3–1.2)
Total Protein: 7.1 g/dL (ref 6.5–8.1)

## 2021-10-05 LAB — LIPASE, BLOOD: Lipase: 34 U/L (ref 11–51)

## 2021-10-05 LAB — LACTIC ACID, PLASMA: Lactic Acid, Venous: 0.9 mmol/L (ref 0.5–1.9)

## 2021-10-05 MED ORDER — OXYCODONE HCL 5 MG PO TABS
10.0000 mg | ORAL_TABLET | Freq: Once | ORAL | Status: AC
  Administered 2021-10-05: 10 mg via ORAL
  Filled 2021-10-05: qty 2

## 2021-10-05 MED ORDER — SULFAMETHOXAZOLE-TRIMETHOPRIM 800-160 MG PO TABS
1.0000 | ORAL_TABLET | Freq: Once | ORAL | Status: AC
  Administered 2021-10-05: 1 via ORAL
  Filled 2021-10-05: qty 1

## 2021-10-05 MED ORDER — MORPHINE SULFATE (PF) 4 MG/ML IV SOLN
4.0000 mg | Freq: Once | INTRAVENOUS | Status: AC | PRN
  Administered 2021-10-05: 4 mg via INTRAVENOUS
  Filled 2021-10-05: qty 1

## 2021-10-05 MED ORDER — ONDANSETRON HCL 4 MG/2ML IJ SOLN
4.0000 mg | Freq: Once | INTRAMUSCULAR | Status: AC | PRN
  Administered 2021-10-05: 4 mg via INTRAVENOUS
  Filled 2021-10-05: qty 2

## 2021-10-05 MED ORDER — SULFAMETHOXAZOLE-TRIMETHOPRIM 800-160 MG PO TABS: 1.0000 | ORAL_TABLET | Freq: Two times a day (BID) | ORAL | 0 refills | Status: AC

## 2021-10-05 MED ORDER — HYDROMORPHONE HCL 1 MG/ML IJ SOLN: 1.0000 mg | Freq: Once | INTRAMUSCULAR | Status: DC

## 2021-10-05 MED ORDER — ACETAMINOPHEN 325 MG PO TABS
650.0000 mg | ORAL_TABLET | Freq: Once | ORAL | Status: AC
  Administered 2021-10-05: 650 mg via ORAL
  Filled 2021-10-05: qty 2

## 2021-10-05 MED ORDER — HYDROMORPHONE HCL 1 MG/ML IJ SOLN
1.0000 mg | Freq: Once | INTRAMUSCULAR | Status: AC
  Administered 2021-10-05: 1 mg via INTRAVENOUS
  Filled 2021-10-05: qty 1

## 2021-10-05 MED ORDER — MORPHINE SULFATE (PF) 4 MG/ML IV SOLN
4.0000 mg | Freq: Once | INTRAVENOUS | Status: DC
  Filled 2021-10-05: qty 1

## 2021-10-05 MED ORDER — IOHEXOL 300 MG/ML  SOLN
100.0000 mL | Freq: Once | INTRAMUSCULAR | Status: AC | PRN
  Administered 2021-10-05: 100 mL via INTRAVENOUS

## 2021-10-05 NOTE — Telephone Encounter (Signed)
Requested medication (s) are due for refill today: yes  Requested medication (s) are on the active medication list: yes  Last refill:  03/24/21 #90 1 RF  Future visit scheduled: yes  Notes to clinic:  overdue lab work   Requested Prescriptions  Pending Prescriptions Disp Refills   lisinopril (ZESTRIL) 40 MG tablet [Pharmacy Med Name: Lisinopril 40 MG Oral Tablet] 90 tablet 0    Sig: Take 1 tablet by mouth once daily     Cardiovascular:  ACE Inhibitors Failed - 10/02/2021 11:13 AM      Failed - Cr in normal range and within 180 days    Creat  Date Value Ref Range Status  02/02/2021 0.90 0.70 - 1.35 mg/dL Final   Creatinine, Ser  Date Value Ref Range Status  10/05/2021 0.89 0.61 - 1.24 mg/dL Final         Failed - K in normal range and within 180 days    Potassium  Date Value Ref Range Status  10/05/2021 4.0 3.5 - 5.1 mmol/L Final  07/15/2011 4.3 3.5 - 5.1 mmol/L Final         Failed - Last BP in normal range    BP Readings from Last 1 Encounters:  10/05/21 (!) 141/84         Passed - Patient is not pregnant      Passed - Valid encounter within last 6 months    Recent Outpatient Visits           2 weeks ago Ear pain, left   [facility] Mecum, Oswaldo Conroy, [place]   8 months ago [employer] preventive visit   [facility] Smitty Cords, DO   1 year ago Essential hypertension   [facility] [place], Netta Neat, DO   1 year ago Loose stools   [facility] Caro Laroche, DO   1 year ago Pain and swelling of right knee   [facility] Smitty Cords, DO       Future Appointments             In 1 month Althea Charon, Netta Neat, DO [facility]

## 2021-10-05 NOTE — ED Triage Notes (Signed)
Pt arrives with c/o periumbilical pain that started about a week ago. Pt endorses n/v/d. Pt denies fevers. Pt has hx of a hernia.

## 2021-10-05 NOTE — ED Provider Notes (Signed)
[facility] Provider Note    Event Date/Time   First MD Initiated Contact with Patient 10/05/21 1034     (approximate)   History   Abdominal Pain   HPI  Ryan [name] is a 66 y.o. male   Past medical history of COPD, CKD, kidney stone, GERD, anxiety here with abdominal pain. Periumbilical pain 1 week worsening, hx hernia and this pain is where the hernia is.  Last bowel movement was yesterday, normal, no blood.  Not passing flatus today.  Pain has acutely worsened within the last 3 to 4 days.  Nausea but no vomiting.  Denies urinary symptoms.  No testicular pain.  History was obtained via patient and review of external medical notes.       Physical Exam   Triage Vital Signs: ED Triage Vitals  Enc Vitals Group     BP      Pulse      Resp      Temp      Temp src      SpO2      Weight      Height      Head Circumference      Peak Flow      Pain Score      Pain Loc      Pain Edu?      Excl. in GC?     Most recent vital signs: Vitals:   10/05/21 1100 10/05/21 1200  BP: (!) 147/91 (!) 154/93  Pulse: 74 76  Resp:  18  Temp:  98.6 F (37 C)  SpO2: 98% 98%    General: Awake, no distress.  CV:  Good peripheral perfusion.  Resp:  Normal effort.  Abd:  No distention.  [place] periumbilical hernia with some redness overlying, firm, tender to palpation .  Remainder of the abdomen is benign with no tenderness to palpation or rigidity or guarding.  Testicles appear normal, nontender    ED Results / Procedures / Treatments   Labs (all labs ordered are listed, but only abnormal results are displayed) Labs Reviewed  COMPREHENSIVE METABOLIC PANEL - Abnormal; Notable for the following components:      Result Value   Glucose, Bld 103 (*)    All other components within normal limits  URINALYSIS, ROUTINE W REFLEX MICROSCOPIC - Abnormal; Notable for the following components:   Color, Urine YELLOW (*)    APPearance CLEAR (*)    All other  components within normal limits  CBC WITH DIFFERENTIAL/PLATELET  LIPASE, BLOOD  LACTIC ACID, PLASMA     I reviewed labs and they are notable for WBC 9.0, H&H wnl, UA w/o leuks or nitrite    RADIOLOGY I independently reviewed and interpreted CT abdomen pelvis and see no obvious obstructive patterns    PROCEDURES:  Critical Care performed: No  Procedures   MEDICATIONS ORDERED IN ED: Medications  morphine (PF) 4 MG/ML injection 4 mg (4 mg Intravenous Patient Refused/Not Given 10/05/21 1154)  sulfamethoxazole-trimethoprim (BACTRIM DS) 800-160 MG per tablet 1 tablet (has no administration in time range)  oxyCODONE (Oxy IR/ROXICODONE) immediate release tablet 10 mg (has no administration in time range)  morphine (PF) 4 MG/ML injection 4 mg (4 mg Intravenous Given 10/05/21 1111)  acetaminophen (TYLENOL) tablet 650 mg (650 mg Oral Given 10/05/21 1104)  ondansetron (ZOFRAN) injection 4 mg (4 mg Intravenous Given 10/05/21 1112)  HYDROmorphone (DILAUDID) injection 1 mg (1 mg Intravenous Given 10/05/21 1153)  iohexol (OMNIPAQUE) 300 MG/ML solution  100 mL (100 mLs Intravenous Contrast Given 10/05/21 1238)    IMPRESSION / MDM / ASSESSMENT AND PLAN / ED COURSE  I reviewed the triage vital signs and the nursing notes.                              Differential diagnosis includes, but is not limited to, incarcerated hernia, strangulated hernia, bowel obstruction, other intra-abdominal infectious processes   MDM: Work-up above significant for CT findings of some cellulitic changes, consistent with my clinical exam of redness of the skin, no fluid collection, no hernia.  Given Bactrim in the emergency department and a prescription for cellulitis coverage. Safe for dc home w close PMD f/u and return precautiosn   Patient's presentation is most consistent with acute presentation with potential threat to life or bodily function.       FINAL CLINICAL IMPRESSION(S) / ED DIAGNOSES   Final  diagnoses:  Cellulitis of abdominal wall     Rx / DC Orders   ED Discharge Orders          Ordered    sulfamethoxazole-trimethoprim (BACTRIM DS) 800-160 MG tablet  2 times daily        10/05/21 1306             Note:  This document was prepared using Dragon voice recognition software and may include unintentional dictation errors.    Pilar Jarvis, MD 10/05/21 1308

## 2021-10-05 NOTE — Discharge Instructions (Addendum)
Take acetaminophen 650 mg and ibuprofen 400 mg every 6 hours for pain.  Take with food. Thank you for choosing us for your health care today!  Please see your primary doctor this week for a follow up appointment.   If you do not have a primary doctor call the following clinics to establish care:  If you have insurance:  [facility] [phone] [address] [place] [address]   [facility]  [phone] [address] [place] [address]   If you do not have insurance:  [facility]  [phone] [address] [place] [address]  Sometimes, in the early stages of certain disease courses it is difficult to detect in the emergency department evaluation -- so, it is important that you continue to monitor your symptoms and call your doctor right away or return to the emergency department if you develop any new or worsening symptoms.  It was my pleasure to care for you today.   Juanjesus Pepperman S. Madaline Lefeber, MD  

## 2021-10-08 ENCOUNTER — Other Ambulatory Visit: Payer: Self-pay

## 2021-10-08 ENCOUNTER — Emergency Department
Admission: EM | Admit: 2021-10-08 | Discharge: 2021-10-08 | Disposition: A | Discharge status: Home / Self Care | Payer: Medicare Other | Attending: Emergency Medicine | Admitting: Emergency Medicine

## 2021-10-08 ENCOUNTER — Emergency Department: Payer: Medicare Other

## 2021-10-08 DIAGNOSIS — L03311 Cellulitis of abdominal wall: Secondary | ICD-10-CM | POA: Diagnosis not present

## 2021-10-08 DIAGNOSIS — R198 Other specified symptoms and signs involving the digestive system and abdomen: Secondary | ICD-10-CM | POA: Diagnosis present

## 2021-10-08 DIAGNOSIS — R109 Unspecified abdominal pain: Secondary | ICD-10-CM | POA: Diagnosis not present

## 2021-10-08 LAB — CBC WITH DIFFERENTIAL/PLATELET
Abs Immature Granulocytes: 0.03 10*3/uL (ref 0.00–0.07)
Basophils Absolute: 0 10*3/uL (ref 0.0–0.1)
Basophils Relative: 1 %
Eosinophils Absolute: 0.1 10*3/uL (ref 0.0–0.5)
Eosinophils Relative: 1 %
HCT: 45.8 % (ref 39.0–52.0)
Hemoglobin: 15.2 g/dL (ref 13.0–17.0)
Immature Granulocytes: 0 %
Lymphocytes Relative: 24 %
Lymphs Abs: 2 10*3/uL (ref 0.7–4.0)
MCH: 32.3 pg (ref 26.0–34.0)
MCHC: 33.2 g/dL (ref 30.0–36.0)
MCV: 97.2 fL (ref 80.0–100.0)
Monocytes Absolute: 0.5 10*3/uL (ref 0.1–1.0)
Monocytes Relative: 6 %
Neutro Abs: 5.8 10*3/uL (ref 1.7–7.7)
Neutrophils Relative %: 68 %
Platelets: 246 10*3/uL (ref 150–400)
RBC: 4.71 MIL/uL (ref 4.22–5.81)
RDW: 13.6 % (ref 11.5–15.5)
WBC: 8.6 10*3/uL (ref 4.0–10.5)
nRBC: 0 % (ref 0.0–0.2)

## 2021-10-08 LAB — BASIC METABOLIC PANEL
Anion gap: 12 (ref 5–15)
BUN: 10 mg/dL (ref 8–23)
CO2: 22 mmol/L (ref 22–32)
Calcium: 8.8 mg/dL — ABNORMAL LOW (ref 8.9–10.3)
Chloride: 105 mmol/L (ref 98–111)
Creatinine, Ser: 1.07 mg/dL (ref 0.61–1.24)
GFR, Estimated: >60 mL/min (ref >60)
Glucose, Bld: 93 mg/dL (ref 70–99)
Potassium: 3.9 mmol/L (ref 3.5–5.1)
Sodium: 139 mmol/L (ref 135–145)

## 2021-10-08 MED ORDER — ONDANSETRON HCL 4 MG/2ML IJ SOLN
4.0000 mg | Freq: Once | INTRAMUSCULAR | Status: AC
  Administered 2021-10-08: 4 mg via INTRAVENOUS
  Filled 2021-10-08: qty 2

## 2021-10-08 MED ORDER — SODIUM CHLORIDE 0.9 % IV SOLN
1.0000 g | Freq: Once | INTRAVENOUS | Status: AC
  Administered 2021-10-08: 1 g via INTRAVENOUS
  Filled 2021-10-08: qty 10

## 2021-10-08 MED ORDER — DOXYCYCLINE HYCLATE 100 MG PO CAPS: 100.0000 mg | ORAL_CAPSULE | Freq: Two times a day (BID) | ORAL | 0 refills | Status: DC

## 2021-10-08 MED ORDER — HYDROCODONE-ACETAMINOPHEN 5-325 MG PO TABS: 1.0000 | ORAL_TABLET | Freq: Four times a day (QID) | ORAL | 0 refills | Status: AC | PRN

## 2021-10-08 MED ORDER — FENTANYL CITRATE PF 50 MCG/ML IJ SOSY
50.0000 ug | PREFILLED_SYRINGE | Freq: Once | INTRAMUSCULAR | Status: AC
  Administered 2021-10-08: 50 ug via INTRAVENOUS
  Filled 2021-10-08: qty 1

## 2021-10-08 NOTE — ED Notes (Signed)
E signature pad not working. Pt educated on discharge instructions and verbalized understanding.  

## 2021-10-08 NOTE — ED Notes (Signed)
See triage note  Presents with some redness and drainage around his umbilicus   States he was seen and placed on antibiotics a few days ago  Noticed drainage 2 days ago  No fever

## 2021-10-08 NOTE — Discharge Instructions (Addendum)
Your ultrasound does not show any fluid collection that we need to try and drain.  Apply a warm compress over the area to encourage it to continue draining.  If the area becomes more swollen, the redness extends outside of the current marking, or if you develop any new symptoms of concern you need to be evaluated again either by primary care or here in the emergency department.  I would recommend that you call and schedule a follow-up appointment with your primary care provider for recheck as early as possible next week.  Finish the antibiotic that you are currently taking then start the new one.

## 2021-10-08 NOTE — ED Provider Notes (Signed)
[facility] Provider Note    Event Date/Time   First MD Initiated Contact with Patient 10/08/21 1802     (approximate)   History   Abscess   HPI  Ryan [name] is a 66 y.o. male with history of emphysema, hypertension, chronic pain, CVA, alcohol abuse and as listed in EMR presents to the emergency department for evaluation of drainage from his umbilical area.  He was diagnosed with cellulitis on 10/05/2021 and has been taking Bactrim twice a day as prescribed.  Over the past 24 hours, the area has started to drain purulent fluid.  Redness has not increased and he has not had a fever that he is aware of.  He has felt nauseated but has not vomited.Marland Kitchen      Physical Exam   Triage Vital Signs: ED Triage Vitals  Enc Vitals Group     BP 10/08/21 1716 131/70     Pulse Rate 10/08/21 1716 91     Resp 10/08/21 1716 18     Temp 10/08/21 1716 98.2 F (36.8 C)     Temp Source 10/08/21 1716 Oral     SpO2 10/08/21 1716 97 %     Weight 10/08/21 1713 170 lb (77.1 kg)     Height 10/08/21 1754 5\' 6"  (1.676 m)     Head Circumference --      Peak Flow --      Pain Score 10/08/21 1713 6     Pain Loc --      Pain Edu? --      Excl. in GC? --     Most recent vital signs: Vitals:   10/08/21 1716 10/08/21 2144  BP: 131/70 131/68  Pulse: 91 89  Resp: 18 18  Temp: 98.2 F (36.8 C)   SpO2: 97% 98%    General: Awake, no distress.  CV:  Good peripheral perfusion.  Resp:  Normal effort.  Abd:  No distention.  Other:  Moderate amount of purulent drainage noted at the umbilicus.  Surrounding area is soft.  No pointing or area of fluctuance is noted on exam.  Umbilical hernia area is soft and skin colored.  It does not appear ecchymotic and is not firm to touch.   ED Results / Procedures / Treatments   Labs (all labs ordered are listed, but only abnormal results are displayed) Labs Reviewed  BASIC METABOLIC PANEL - Abnormal; Notable for the following components:       Result Value   Calcium 8.8 (*)    All other components within normal limits  CULTURE, BLOOD (ROUTINE X 2)  CULTURE, BLOOD (ROUTINE X 2)  CBC WITH DIFFERENTIAL/PLATELET     EKG  Not indicated   RADIOLOGY  Image interpreted and radiology report reviewed by me.  Soft tissue ultrasound over the umbilicus and area of purulent drainage is without a focal fluid can collection.  PROCEDURES:  Critical Care performed: No  Procedures   MEDICATIONS ORDERED IN ED: Medications  cefTRIAXone (ROCEPHIN) 1 g in sodium chloride 0.9 % 100 mL IVPB (0 g Intravenous Stopped 10/08/21 2107)  fentaNYL (SUBLIMAZE) injection 50 mcg (50 mcg Intravenous Given 10/08/21 2107)  ondansetron (ZOFRAN) injection 4 mg (4 mg Intravenous Given 10/08/21 2108)     IMPRESSION / MDM / ASSESSMENT AND PLAN / ED COURSE   I have reviewed the triage note.  Differential diagnosis includes, but is not limited to, cellulitis, abscess  66 year old male presenting to the emergency department for evaluation of  purulent drainage from the umbilical area.  See HPI for further details.  On exam, he does have a moderate amount of purulent drainage but no area of pointing or fluctuance.  Stgermain umbilical hernia is without sign or concern of incarceration.  Patient was here on 10/05/2021 and had a work-up including labs and a CT abdomen and pelvis with contrast.  That CT did not show abscess but did show cellulitis.  Lab studies today are overall reassuring.  There is no leukocytosis.  Vital signs are stable and there is no indication of sepsis today.  While here, the patient was given an IV dose of Rocephin as well as Zofran.  He was also given fentanyl after confirming that he is not driving home.  Results were discussed with the patient.  Plan will be to have him finish his Bactrim then start the doxycycline as written.  He is to call tomorrow to schedule a follow-up appointment for recheck with his primary care provider early next  week.  If he develops any new symptoms or current symptoms change or worsen he is to return to the emergency department over the weekend.      FINAL CLINICAL IMPRESSION(S) / ED DIAGNOSES   Final diagnoses:  Cellulitis of abdominal wall     Rx / DC Orders   ED Discharge Orders          Ordered    doxycycline (VIBRAMYCIN) 100 MG capsule  2 times daily        10/08/21 2026    HYDROcodone-acetaminophen (NORCO/VICODIN) 5-325 MG tablet  Every 6 hours PRN        10/08/21 2030             Note:  This document was prepared using Dragon voice recognition software and may include unintentional dictation errors.   Chinita Pester, FNP 10/08/21 2349    Concha Se, MD 10/09/21 Jerene Bears

## 2021-10-08 NOTE — ED Triage Notes (Addendum)
PT coming pov from home for umbilical hernia with brownish pus leaking from site. Pt was seen here recently and given po abx, pt still has a few and has been taking the meds at ordered. Pt stating the pus started a few days after he was seen here.   PT endorsing pain in area. Pt denies any chills, fever or other illness

## 2021-10-13 LAB — CULTURE, BLOOD (ROUTINE X 2)
Culture: NO GROWTH
Culture: NO GROWTH

## 2021-10-14 DIAGNOSIS — Z79899 Other long term (current) drug therapy: Secondary | ICD-10-CM | POA: Diagnosis not present

## 2021-11-11 ENCOUNTER — Ambulatory Visit
Payer: Medicare Other | Attending: Student in an Organized Health Care Education/Training Program | Admitting: Student in an Organized Health Care Education/Training Program

## 2021-11-11 ENCOUNTER — Encounter: Payer: Self-pay | Admitting: Student in an Organized Health Care Education/Training Program

## 2021-11-11 DIAGNOSIS — M47816 Spondylosis without myelopathy or radiculopathy, lumbar region: Secondary | ICD-10-CM | POA: Diagnosis not present

## 2021-11-11 DIAGNOSIS — G894 Chronic pain syndrome: Secondary | ICD-10-CM

## 2021-11-11 NOTE — Progress Notes (Signed)
Patient: Ryan [name]  Service Category: E/M  Provider: [name], MD  DOB: 03-22-1955  DOS: 11/11/2021  Location: Office  MRN: [id number]  Setting: Ambulatory outpatient  Referring Provider: Nobie Putnam *  Type: Established Patient  Specialty: Interventional Pain Management  PCP: Olin Hauser, DO  Location: Remote location  Delivery: TeleHealth     Virtual Encounter - Pain Management PROVIDER NOTE: Information contained herein reflects review and annotations entered in association with encounter. Interpretation of such information and data should be left to medically-trained personnel. Information provided to patient can be located elsewhere in the medical record under "Patient Instructions". Document created using STT-dictation technology, any transcriptional errors that may result from process are unintentional.    Contact & Pharmacy Preferred: [phone] Home: [phone] (home) Mobile: There is no such number on file (mobile). E-mail: [email or web address]  [facility] [address]), [place] [address] [place] ([place]) [place] [address] Phone: [phone] Fax: [phone]   Pre-screening  Ryan [name] offered "in-person" vs "virtual" encounter. He indicated preferring virtual for this encounter.   Reason COVID-19*  Social distancing based on CDC and AMA recommendations.   I contacted Ryan [name] on 11/11/2021 via telephone.      I clearly identified myself as 11/24/2021, MD. I verified that I was speaking with the correct person using two identifiers (Name: Ryan [name], and date of birth: Sep 19, 1955).  Consent I sought verbal advanced consent from 11/25/1955 for virtual visit interactions. I informed Ryan [name] of possible security and privacy concerns, risks, and limitations associated with providing "not-in-person" medical evaluation and management services. I also informed Ryan [name] of the availability  of "in-person" appointments. Finally, I informed him that there would be a charge for the virtual visit and that he could be  personally, fully or partially, financially responsible for it. Ryan [name] expressed understanding and agreed to proceed.   Historic Elements   Ryan [name] is a 66 y.o. year old, male patient evaluated today after our last contact on 09/16/2021. Ryan [name]  has a past medical history of Anxiety, Arthritis, Asthma, Chronic kidney disease, COPD (chronic obstructive pulmonary disease) ([place]), Dyspnea, GERD (gastroesophageal reflux disease), Hyperlipidemia, Hypertension, Lower urinary tract symptoms (LUTS), Prostate disease, Stroke [facility]), Thyroid disease, Wears dentures, and Wears hearing aid. He also  has a past surgical history that includes Eye surgery; Hernia repair; Transforaminal lumbar interbody fusion (tlif) with pedicle screw fixation 1 level (Right, 06/18/2014); Myringotomy with tube placement (Bilateral, 03/28/2015); Back surgery; Esophagogastroduodenoscopy (egd) with propofol (N/A, 10/24/2015); Cataract extraction w/PHACO (Left, 01/06/2016); Tonsillectomy; Colonoscopy with propofol (N/A, 04/19/2016); and Sinusotomy (02/28/2019). Ryan [name] has a current medication list which includes the following prescription(s): albuterol, alprazolam, aspirin ec, atorvastatin, cetirizine, doxycycline, gabapentin, lisinopril, nicotine, sildenafil, tamsulosin, and risperidone. He  reports that he has been smoking cigarettes. He has a 60.00 pack-year smoking history. He uses smokeless tobacco. He reports current alcohol use.  Drug: Marijuana. Ryan [name] has No Known Allergies.   HPI  Today, he is being contacted for a post-procedure assessment.   Post-procedure evaluation  Type: Lumbar Facet, Medial Branch Block(s) #2,  2023 Primary Purpose: Therapeutic Region: Posterolateral Lumbosacral Spine Level: L3, L4, L5,  Medial Branch Level(s). Injecting these levels blocks the L3-4, L4-5 lumbar facet  joints. Laterality: Bilateral  Effectiveness:  Initial hour after procedure: 100 %  Subsequent 4-6 hours post-procedure: 100 %  Analgesia past initial 6 hours: 100 % (lasting 4-5 weeks)  Ongoing improvement:  Analgesic:  70-75% however pain returning Function: Somewhat improved ROM: Somewhat improved   Laboratory Chemistry Profile   Renal Lab Results  Component Value Date   BUN 10 10/08/2021   CREATININE 1.07 71/24/5809   BCR NOT APPLICABLE 98/33/8250   GFRAA >60 09/11/2019   GFRNONAA >60 10/08/2021    Hepatic Lab Results  Component Value Date   AST 17 10/05/2021   ALT 15 10/05/2021   ALBUMIN 4.1 10/05/2021   ALKPHOS 99 10/05/2021   LIPASE 34 10/05/2021    Electrolytes Lab Results  Component Value Date   NA 139 10/08/2021   K 3.9 10/08/2021   CL 105 10/08/2021   CALCIUM 8.8 (L) 10/08/2021    Bone Lab Results  Component Value Date   TESTOSTERONE 473 02/02/2021    Inflammation (CRP: Acute Phase) (ESR: Chronic Phase) Lab Results  Component Value Date   LATICACIDVEN 0.9 10/05/2021         Note: Above Lab results reviewed.  Imaging  US Abdomen Limited CLINICAL DATA:  Raised draining lesion at the umbilicus.  Pain.  EXAM: ULTRASOUND ABDOMEN LIMITED  COMPARISON:  CT abdomen and pelvis 10/05/2021  FINDINGS: There is skin thickening at the level of the umbilicus. There is edematous subcutaneous tissue in this region without discrete drainable fluid collection identified. No evidence for hernia.  Sonographer notes examination is limited secondary to patient's pain.  IMPRESSION: 1. Skin thickening and subcutaneous edema at the level of the umbilicus may represent cellulitis. No discrete fluid collection identified  Electronically Signed   By: Ronney Asters M.D.   On: 10/08/2021 18:51  Assessment  The primary encounter diagnosis was Lumbar spondylosis. Diagnoses of Lumbar facet arthropathy and Chronic pain syndrome were also pertinent to this  visit.  Plan of Care  1. Lumbar spondylosis - LUMBAR FACET(MEDIAL BRANCH NERVE BLOCK) MBNB; Future  2. Lumbar facet arthropathy - LUMBAR FACET(MEDIAL BRANCH NERVE BLOCK) MBNB; Future  3. Chronic pain syndrome - LUMBAR FACET(MEDIAL BRANCH NERVE BLOCK) MBNB; Future    Orders:  Orders Placed This Encounter  Procedures   LUMBAR FACET(MEDIAL BRANCH NERVE BLOCK) MBNB    Standing Status:   Future    Standing Expiration Date:   02/11/2022    Scheduling Instructions:     Procedure: Lumbar facet block (AKA.: Lumbosacral medial branch nerve block)     Side: Bilateral     Level: L3-4 & L5-S1 Facets ( L3, L4, L5, Medial Branch Nerves)     Sedation: moderate     Timeframe: ASAA    Order Specific Question:   Where will this procedure be performed?    Answer:   [facility]   Follow-up plan:   Return in about 3 weeks (around 12/02/2021) for B/L L3, 4, 5 MBNB , ECT.    Recent Visits Date Type Provider Dept  09/16/21 Procedure visit [name], MD [facility]  Showing recent visits within past 90 days and meeting all other requirements Today's Visits Date Type Provider Dept  11/11/21 Office Visit [name], MD [facility]  Showing today's visits and meeting all other requirements Future Appointments No visits were found meeting these conditions. Showing future appointments within next 90 days and meeting all other requirements  I discussed the assessment and treatment plan with the patient. The patient was provided an opportunity to ask questions and all were answered. The patient agreed with the plan and demonstrated an understanding of the instructions.  Patient advised to call back or  seek an in-person evaluation if the symptoms or condition worsens.  Duration of encounter: 40mnutes.  Note by: BGillis Santa MD Date: 11/11/2021; Time: 3:35 PM

## 2021-11-18 ENCOUNTER — Ambulatory Visit (INDEPENDENT_AMBULATORY_CARE_PROVIDER_SITE_OTHER): Payer: Medicare Other | Admitting: Family Medicine

## 2021-11-18 ENCOUNTER — Encounter: Payer: Self-pay | Admitting: Family Medicine

## 2021-11-18 VITALS — BP 119/83 | HR 66 | Ht 66.0 in | Wt 168.0 lb

## 2021-11-18 DIAGNOSIS — R7303 Prediabetes: Secondary | ICD-10-CM

## 2021-11-18 DIAGNOSIS — Z6827 Body mass index (BMI) 27.0-27.9, adult: Secondary | ICD-10-CM

## 2021-11-18 DIAGNOSIS — J432 Centrilobular emphysema: Secondary | ICD-10-CM | POA: Diagnosis not present

## 2021-11-18 DIAGNOSIS — E782 Mixed hyperlipidemia: Secondary | ICD-10-CM | POA: Diagnosis not present

## 2021-11-18 DIAGNOSIS — Z0000 Encounter for general adult medical examination without abnormal findings: Secondary | ICD-10-CM | POA: Diagnosis not present

## 2021-11-18 DIAGNOSIS — R7309 Other abnormal glucose: Secondary | ICD-10-CM

## 2021-11-18 DIAGNOSIS — I10 Essential (primary) hypertension: Secondary | ICD-10-CM | POA: Diagnosis not present

## 2021-11-18 DIAGNOSIS — Z23 Encounter for immunization: Secondary | ICD-10-CM

## 2021-11-18 DIAGNOSIS — N401 Enlarged prostate with lower urinary tract symptoms: Secondary | ICD-10-CM

## 2021-11-18 DIAGNOSIS — N529 Male erectile dysfunction, unspecified: Secondary | ICD-10-CM

## 2021-11-18 DIAGNOSIS — R350 Frequency of micturition: Secondary | ICD-10-CM

## 2021-11-18 MED ORDER — ALBUTEROL SULFATE HFA 108 (90 BASE) MCG/ACT IN AERS: 2.0000 | INHALATION_SPRAY | Freq: Four times a day (QID) | RESPIRATORY_TRACT | 3 refills | Status: DC | PRN

## 2021-11-18 MED ORDER — ATORVASTATIN CALCIUM 80 MG PO TABS: 80.0000 mg | ORAL_TABLET | Freq: Every day | ORAL | 3 refills | Status: DC

## 2021-11-18 MED ORDER — SILDENAFIL CITRATE 20 MG PO TABS: 80.0000 mg | ORAL_TABLET | ORAL | 5 refills | Status: DC | PRN

## 2021-11-18 MED ORDER — LISINOPRIL 20 MG PO TABS: 20.0000 mg | ORAL_TABLET | Freq: Every day | ORAL | 3 refills | Status: DC

## 2021-11-18 MED ORDER — BREZTRI AEROSPHERE 160-9-4.8 MCG/ACT IN AERO: 2.0000 | INHALATION_SPRAY | Freq: Two times a day (BID) | RESPIRATORY_TRACT | 11 refills | Status: DC

## 2021-11-18 MED ORDER — TAMSULOSIN HCL 0.4 MG PO CAPS: 0.4000 mg | ORAL_CAPSULE | Freq: Two times a day (BID) | ORAL | 3 refills | Status: DC

## 2021-11-18 MED ORDER — ALBUTEROL SULFATE (2.5 MG/3ML) 0.083% IN NEBU: 2.5000 mg | INHALATION_SOLUTION | Freq: Four times a day (QID) | RESPIRATORY_TRACT | 5 refills | Status: AC | PRN

## 2021-11-18 NOTE — Assessment & Plan Note (Signed)
Check fasting lipid panel today. 

## 2021-11-18 NOTE — Assessment & Plan Note (Signed)
Order Sildenafil w / good rx

## 2021-11-18 NOTE — Assessment & Plan Note (Signed)
Check A1c. 

## 2021-11-18 NOTE — Progress Notes (Signed)
Subjective:    Patient ID: Ryan [name], male    DOB: 11-Oct-1955, 66 y.o.   MRN: [id number]  Ryan [name] is a 65 y.o. 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 Tolerating well, w/o complaints. Denies CP, dyspnea, HA, edema, dizziness / lightheadedness   BPH - On Tamsulosin   Pre-Diabetes Last lab showed A1c 6.0 Due for repeat today   Chronic Back Pain / History of back surgery Followed by Dr Holley Raring Titanium disc in back prior surgery Inject steroids with improvement On Gabapentin   Centrilobular Emphysema Tobacco Abuse     Hearing aid   HM   Due for Flu Shot, will receive today      11/18/2021    2:18 PM 06/08/2021    8:59 AM 01/28/2021    1:48 PM  Depression screen PHQ 2/9  Decreased Interest 1 1 0  Down, Depressed, Hopeless 0 0 0  PHQ - 2 Score 1 1 0  Altered sleeping 1    Tired, decreased energy 2    Change in appetite 0    Feeling bad or failure about yourself  0    Trouble concentrating 0    Moving slowly or fidgety/restless 0    Suicidal thoughts 0    PHQ-9 Score 4        11/18/2021    2:18 PM 10/23/2019   11:03 AM  GAD 7 : Generalized Anxiety Score  Nervous, Anxious, on Edge 0 0  Control/stop worrying 1 1  Worry too much - different things 0 0  Trouble relaxing 0 0  Restless 0 0  Easily  annoyed or irritable 1 0  Afraid - awful might happen 1 1  Total GAD 7 Score 3 2  Anxiety Difficulty Somewhat difficult Not difficult at all      Past Medical History:  Diagnosis Date  . Anxiety   . Arthritis    left hand  . Asthma   . Chronic kidney disease    HAS HAD KIDNEY STONE 2015-- JUST PASSED  . COPD (chronic obstructive pulmonary disease) ([place])   . Dyspnea   . GERD (gastroesophageal reflux disease)    TAKES TUMS FOR RELIEF  . Hyperlipidemia   . Hypertension    Improved, No longer on meds  . Lower urinary tract symptoms (LUTS)   . Prostate disease   . Stroke ([place])   . Thyroid disease   . Wears dentures    hass full upper plate, not wearing, broken  . Wears hearing aid    Past Surgical History:  Procedure Laterality Date  . BACK SURGERY    . CATARACT EXTRACTION W/PHACO Left 01/06/2016   Procedure: CATARACT EXTRACTION PHACO AND INTRAOCULAR LENS PLACEMENT (IOC);  Surgeon: Eulogio Bear, MD;  Location: [place];  Service: Ophthalmology;  Laterality: Left;  LEFT  .  COLONOSCOPY WITH PROPOFOL N/A 04/19/2016   Procedure: COLONOSCOPY WITH PROPOFOL;  Surgeon: Manya Silvas, MD;  Location: [facility] ENDOSCOPY;  Service: Endoscopy;  Laterality: N/A;  . ESOPHAGOGASTRODUODENOSCOPY (EGD) WITH PROPOFOL N/A 10/24/2015   Procedure: ESOPHAGOGASTRODUODENOSCOPY (EGD) WITH PROPOFOL;  Surgeon: Manya Silvas, MD;  Location[facility] ENDOSCOPY;  Service: Endoscopy;  Laterality: N/A;  . EYE SURGERY    . HERNIA REPAIR     ERRONEOUS UMBILICAL HERNIA   [id number]  . MYRINGOTOMY WITH TUBE PLACEMENT Bilateral 03/28/2015   Procedure: MYRINGOTOMY WITH TUBE PLACEMENT;  Surgeon: Beverly Gust, MD;  Location: [place];  Service: ENT;  Laterality: Bilateral;  BUTTERFLY TUBE  . SINUSOTOMY  02/28/2019  . TONSILLECTOMY    . TRANSFORAMINAL LUMBAR INTERBODY FUSION (TLIF) WITH PEDICLE SCREW FIXATION 1 LEVEL Right 06/18/2014   Procedure: TRANSFORAMINAL LUMBAR INTERBODY FUSION (TLIF) WITH  PEDICLE SCREW FIXATION 1 LEVEL LUMBAR 5 -SACRAL 1;  Surgeon: Karie Chimera, MD;  Location: [place] NEURO ORS;  Service: Neurosurgery;  Laterality: Right;  Right transforaminal lumbar interbody fusion with interbody prosthesis and percutaneous pedicle screws Lumbar 5 to Sacral 1   Social History   Socioeconomic History  . Marital status: Married    Spouse name: Not on file  . Number of children: Not on file  . Years of education: Not on file  . Highest education level: Not on file  Occupational History  . Not on file  Tobacco Use  . Smoking status: Every Day    Packs/day: 1.50    Years: 40.00    Total pack years: 60.00    Types: Cigarettes  . Smokeless tobacco: Current  . Tobacco comments:    has cut back to 1PPD  Vaping Use  . Vaping Use: Never used  Substance and Sexual Activity  . Alcohol use: Yes    Alcohol/week: 0.0 standard drinks of alcohol    Comment: ONCE OR TWICE A YR  . Drug use: Not on file  . Sexual activity: Not on file  Other Topics Concern  . Not on file  Social History Narrative  . Not on file   Social Determinants of Health   Financial Resource Strain: Not on file  Food Insecurity: Not on file  Transportation Needs: Not on file  Physical Activity: Not on file  Stress: Not on file  Social Connections: Not on file  Intimate Partner Violence: Not on file   Family History  Problem Relation Age of Onset  . Arthritis Mother   . Hypertension Mother   . Diabetes Father   . Hypertension Father    Current Outpatient Medications on File Prior to Visit  Medication Sig  . ALPRAZolam (XANAX) 1 MG tablet Take 1 mg by mouth 2 (two) times daily as needed.  Marland Kitchen aspirin EC 81 MG EC tablet Take 1 tablet (81 mg total) by mouth daily. Swallow whole.  . cetirizine (ZYRTEC) 10 MG tablet Take 10 mg by mouth daily.  Marland Kitchen gabapentin (NEURONTIN) 400 MG capsule Take 400 mg by mouth in the morning and at bedtime. 400 mg twice a day as per patient --psychiatrist changed that Normally  takes 400 mg daily   No current facility-administered medications on file prior to visit.    Review of Systems  Constitutional:  Negative for activity change, appetite change, chills, diaphoresis, fatigue and fever.  HENT:  Negative for congestion and hearing loss.   Eyes:  Negative for visual disturbance.  Respiratory:  Negative for cough, chest tightness, shortness of breath and wheezing.  Cardiovascular:  Negative for chest pain, palpitations and leg swelling.  Gastrointestinal:  Negative for abdominal pain, constipation, diarrhea, nausea and vomiting.  Genitourinary:  Negative for dysuria, frequency and hematuria.  Musculoskeletal:  Negative for arthralgias and neck pain.  Skin:  Negative for rash.  Neurological:  Negative for dizziness, weakness, light-headedness, numbness and headaches.  Hematological:  Negative for adenopathy.  Psychiatric/Behavioral:  Negative for behavioral problems, dysphoric mood and sleep disturbance.    Per HPI unless specifically indicated above      Objective:    BP 119/83   Pulse 66   Ht 5\' 6"  (1.676 m)   Wt 168 lb (76.2 kg)   SpO2 98%   BMI 27.12 kg/m   Wt Readings from Last 3 Encounters:  11/18/21 168 lb (76.2 kg)  10/08/21 169 lb 15.6 oz (77.1 kg)  10/05/21 165 lb (74.8 kg)    Physical Exam Vitals and nursing note reviewed.  Constitutional:      General: He is not in acute distress.    Appearance: He is well-developed. He is not diaphoretic.     Comments: Well-appearing, comfortable, cooperative  HENT:     Head: Normocephalic and atraumatic.  Eyes:     General:        Right eye: No discharge.        Left eye: No discharge.     Conjunctiva/sclera: Conjunctivae normal.     Pupils: Pupils are equal, round, and reactive to light.  Neck:     Thyroid: No thyromegaly.     Vascular: No carotid bruit.  Cardiovascular:     Rate and Rhythm: Normal rate and regular rhythm.     Pulses: Normal pulses.     Heart sounds: Normal heart  sounds. No murmur heard. Pulmonary:     Effort: Pulmonary effort is normal. No respiratory distress.     Breath sounds: Normal breath sounds. No wheezing or rales.     Comments: Mild reduced air movement. Cough Abdominal:     General: Bowel sounds are normal. There is no distension.     Palpations: Abdomen is soft. There is no mass.     Tenderness: There is no abdominal tenderness.  Musculoskeletal:        General: No tenderness. Normal range of motion.     Cervical back: Normal range of motion and neck supple.     Right lower leg: No edema.     Left lower leg: No edema.     Comments: Upper / Lower Extremities: - Normal muscle tone, strength bilateral upper extremities 5/5, lower extremities 5/5  Lymphadenopathy:     Cervical: No cervical adenopathy.  Skin:    General: Skin is warm and dry.     Findings: No erythema or rash.  Neurological:     Mental Status: He is alert and oriented to person, place, and time.     Comments: Distal sensation intact to light touch all extremities  Psychiatric:        Mood and Affect: Mood normal.        Behavior: Behavior normal.        Thought Content: Thought content normal.     Comments: Well groomed, good eye contact, normal speech and thoughts   Results for orders placed or performed during the hospital encounter of 10/08/21  Blood culture (routine x 2)   Specimen: BLOOD  Result Value Ref Range   Specimen Description BLOOD RIGHT ASSIST CONTROL    Special Requests  BOTTLES DRAWN AEROBIC AND ANAEROBIC Blood Culture results may not be optimal due to an excessive volume of blood received in culture bottles   Culture      NO GROWTH 5 DAYS Performed at [facility], [address] [place], [place] [address]    Report Status 10/13/2021 FINAL   Blood culture (routine x 2)   Specimen: BLOOD  Result Value Ref Range   Specimen Description BLOOD LEFT ASSIST CONTROL    Special Requests      BOTTLES DRAWN AEROBIC AND ANAEROBIC Blood  Culture results may not be optimal due to an inadequate volume of blood received in culture bottles   Culture      NO GROWTH 5 DAYS Performed at [facility], [address] [place], [place] [address]    Report Status 10/13/2021 FINAL   Basic metabolic panel  Result Value Ref Range   Sodium 139 135 - 145 mmol/L   Potassium 3.9 3.5 - 5.1 mmol/L   Chloride 105 98 - 111 mmol/L   CO2 22 22 - 32 mmol/L   Glucose, Bld 93 70 - 99 mg/dL   BUN 10 8 - 23 mg/dL   Creatinine, Ser 1.07 0.61 - 1.24 mg/dL   Calcium 8.8 (L) 8.9 - 10.3 mg/dL   GFR, Estimated >60 >60 mL/min   Anion gap 12 5 - 15  CBC with Differential  Result Value Ref Range   WBC 8.6 4.0 - 10.5 K/uL   RBC 4.71 4.22 - 5.81 MIL/uL   Hemoglobin 15.2 13.0 - 17.0 g/dL   HCT 45.8 39.0 - 52.0 %   MCV 97.2 80.0 - 100.0 fL   MCH 32.3 26.0 - 34.0 pg   MCHC 33.2 30.0 - 36.0 g/dL   RDW 13.6 11.5 - 15.5 %   Platelets 246 150 - 400 K/uL   nRBC 0.0 0.0 - 0.2 %   Neutrophils Relative % 68 %   Neutro Abs 5.8 1.7 - 7.7 K/uL   Lymphocytes Relative 24 %   Lymphs Abs 2.0 0.7 - 4.0 K/uL   Monocytes Relative 6 %   Monocytes Absolute 0.5 0.1 - 1.0 K/uL   Eosinophils Relative 1 %   Eosinophils Absolute 0.1 0.0 - 0.5 K/uL   Basophils Relative 1 %   Basophils Absolute 0.0 0.0 - 0.1 K/uL   Immature Granulocytes 0 %   Abs Immature Granulocytes 0.03 0.00 - 0.07 K/uL      Assessment & Plan:   Problem List Items Addressed This Visit     Centrilobular emphysema (HCC)    Centrilobular emphysema Without acute flare Not on maintenance therapy Using albuterol often Re order Albuterol inhaler + Albuterol neb solution Start sample Breztri 2 puff BID and rx sent to pharmacy for maintenance to limit his rescue Albuterol usage      Relevant Medications   albuterol (PROVENTIL) (2.5 MG/3ML) 0.083% nebulizer solution   albuterol (VENTOLIN HFA) 108 (90 Base) MCG/ACT inhaler   BREZTRI AEROSPHERE 160-9-4.8 MCG/ACT AERO   ED (erectile  dysfunction) of organic origin    Order Sildenafil w / good rx      Relevant Medications   sildenafil (REVATIO) 20 MG tablet   Essential hypertension    Controlled HTN - Home BP readings improved 120-130s  Hx CVA    Plan:  1. Trial on dose adjust again back down to Lisinopril 20mg  daily, if unsuccessful would switch to ARB next time 2. Encourage improved lifestyle - low sodium diet, regular exercise 3.  Continue monitor BP outside office, bring readings to next visit, if persistently >140/90 or new symptoms notify office sooner      Relevant Medications   atorvastatin (LIPITOR) 80 MG tablet   lisinopril (ZESTRIL) 20 MG tablet   sildenafil (REVATIO) 20 MG tablet   Other Relevant Orders   COMPLETE METABOLIC PANEL WITH GFR   CBC with Differential/Platelet   Mixed hyperlipidemia    Check fasting lipid panel today      Relevant Medications   atorvastatin (LIPITOR) 80 MG tablet   lisinopril (ZESTRIL) 20 MG tablet   sildenafil (REVATIO) 20 MG tablet   Other Relevant Orders   Lipid panel   Pre-diabetes    Check A1c      Other Visit Diagnoses     Annual physical exam    -  Primary   Relevant Orders   COMPLETE METABOLIC PANEL WITH GFR   CBC with Differential/Platelet   Lipid panel   Hemoglobin A1c   PSA   Needs flu shot       Relevant Orders   Flu Vaccine QUAD High Dose(Fluad) (Completed)   Benign prostatic hyperplasia with urinary frequency       Relevant Medications   tamsulosin (FLOMAX) 0.4 MG CAPS capsule   Other Relevant Orders   PSA   Abnormal glucose       Relevant Orders   Hemoglobin A1c   BMI 27.0-27.9,adult           Updated Health Maintenance information Reviewed recent lab results with patient Encouraged improvement to lifestyle with diet and exercise Goal of weight loss  Flu Shot today  Future Shingles and RSV Vaccine at the pharmacy  Orders Placed This Encounter  Procedures  . Flu Vaccine QUAD High Dose(Fluad)  . COMPLETE METABOLIC PANEL  WITH GFR  . CBC with Differential/Platelet  . Lipid panel    Order Specific Question:   Has the patient fasted?    Answer:   Yes  . Hemoglobin A1c  . PSA      Meds ordered this encounter  Medications  . albuterol (PROVENTIL) (2.5 MG/3ML) 0.083% nebulizer solution    Sig: Take 3 mLs (2.5 mg total) by nebulization every 6 (six) hours as needed for wheezing or shortness of breath.    Dispense:  150 mL    Refill:  5  . albuterol (VENTOLIN HFA) 108 (90 Base) MCG/ACT inhaler    Sig: Inhale 2 puffs into the lungs every 6 (six) hours as needed for wheezing or shortness of breath.    Dispense:  8 g    Refill:  3  . atorvastatin (LIPITOR) 80 MG tablet    Sig: Take 1 tablet (80 mg total) by mouth at bedtime.    Dispense:  90 tablet    Refill:  3    Add future refills  . lisinopril (ZESTRIL) 20 MG tablet    Sig: Take 1 tablet (20 mg total) by mouth daily.    Dispense:  90 tablet    Refill:  3  . sildenafil (REVATIO) 20 MG tablet    Sig: Take 4-5 tablets (80-100 mg total) by mouth as needed (ED).    Dispense:  60 tablet    Refill:  5  . tamsulosin (FLOMAX) 0.4 MG CAPS capsule    Sig: Take 1 capsule (0.4 mg total) by mouth in the morning and at bedtime.    Dispense:  180 capsule    Refill:  3  . BREZTRI AEROSPHERE 160-9-4.8 MCG/ACT AERO  Sig: Inhale 2 puffs into the lungs 2 (two) times daily.    Dispense:  10.7 g    Refill:  11      Follow up plan: Return in about 6 months (around 05/20/2022) for 6 month follow-up COPD, HTN, updates.  Nobie Putnam, DO [facility] 11/18/2021, 1:26 PM

## 2021-11-18 NOTE — Assessment & Plan Note (Signed)
Centrilobular emphysema Without acute flare Not on maintenance therapy Using albuterol often Re order Albuterol inhaler + Albuterol neb solution Start sample Breztri 2 puff BID and rx sent to pharmacy for maintenance to limit his rescue Albuterol usage

## 2021-11-18 NOTE — Assessment & Plan Note (Signed)
Controlled HTN - Home BP readings improved 120-130s  Hx CVA    Plan:  1. Trial on dose adjust again back down to Lisinopril 20mg  daily, if unsuccessful would switch to ARB next time 2. Encourage improved lifestyle - low sodium diet, regular exercise 3. Continue monitor BP outside office, bring readings to next visit, if persistently >140/90 or new symptoms notify office sooner

## 2021-11-18 NOTE — Patient Instructions (Addendum)
Thank you for coming to the office today.  Flu Shot today  Future Shingles and RSV Vaccine at the pharmacy  Start new rx Breztri inhaler 2 puffs twice a day. Free sample good for 1 week.  All medicines refilled  Lisinopril reduced from 40mg  down to 20mg  daily.  Sildenafil Viagra ordered at 60 pills, coupon printed   Please schedule a Follow-up Appointment to: Return in about 6 months (around 05/20/2022) for 6 month follow-up COPD, HTN, updates.  If you have any other questions or concerns, please feel free to call the office or send a message through [place]. You may also schedule an earlier appointment if necessary.  Additionally, you may be receiving a survey about your experience at our office within a few days to 1 week by e-mail or mail. We value your feedback.  Nobie Putnam, DO [place]

## 2021-11-19 LAB — CBC WITH DIFFERENTIAL/PLATELET
Absolute Monocytes: 422 cells/uL (ref 200–950)
Basophils Absolute: 48 cells/uL (ref 0–200)
Basophils Relative: 0.5 %
Eosinophils Absolute: 86 cells/uL (ref 15–500)
Eosinophils Relative: 0.9 %
HCT: 46 % (ref 38.5–50.0)
Hemoglobin: 15.9 g/dL (ref 13.2–17.1)
Lymphs Abs: 2093 cells/uL (ref 850–3900)
MCH: 33.2 pg — ABNORMAL HIGH (ref 27.0–33.0)
MCHC: 34.6 g/dL (ref 32.0–36.0)
MCV: 96 fL (ref 80.0–100.0)
MPV: 10.1 fL (ref 7.5–12.5)
Monocytes Relative: 4.4 %
Neutro Abs: 6950 cells/uL (ref 1500–7800)
Neutrophils Relative %: 72.4 %
Platelets: 184 10*3/uL (ref 140–400)
RBC: 4.79 10*6/uL (ref 4.20–5.80)
RDW: 12.3 % (ref 11.0–15.0)
Total Lymphocyte: 21.8 %
WBC: 9.6 10*3/uL (ref 3.8–10.8)

## 2021-11-19 LAB — COMPLETE METABOLIC PANEL WITH GFR
AG Ratio: 1.7 (calc) (ref 1.0–2.5)
ALT: 14 U/L (ref 9–46)
AST: 16 U/L (ref 10–35)
Albumin: 4.3 g/dL (ref 3.6–5.1)
Alkaline phosphatase (APISO): 134 U/L (ref 35–144)
BUN: 11 mg/dL (ref 7–25)
CO2: 27 mmol/L (ref 20–32)
Calcium: 9.4 mg/dL (ref 8.6–10.3)
Chloride: 105 mmol/L (ref 98–110)
Creat: 0.98 mg/dL (ref 0.70–1.35)
Globulin: 2.6 g/dL (calc) (ref 1.9–3.7)
Glucose, Bld: 93 mg/dL (ref 65–99)
Potassium: 4.8 mmol/L (ref 3.5–5.3)
Sodium: 142 mmol/L (ref 135–146)
Total Bilirubin: 0.3 mg/dL (ref 0.2–1.2)
Total Protein: 6.9 g/dL (ref 6.1–8.1)
eGFR: 85 mL/min/{1.73_m2} (ref >=60)

## 2021-11-19 LAB — LIPID PANEL
Cholesterol: 90 mg/dL (ref <200)
HDL: 41 mg/dL (ref >=40)
LDL Cholesterol (Calc): 36 mg/dL (calc)
Non-HDL Cholesterol (Calc): 49 mg/dL (calc) (ref <130)
Total CHOL/HDL Ratio: 2.2 (calc) (ref <5.0)
Triglycerides: 46 mg/dL (ref <150)

## 2021-11-19 LAB — HEMOGLOBIN A1C
Hgb A1c MFr Bld: 5.8 % of total Hgb — ABNORMAL HIGH (ref <5.7)
Mean Plasma Glucose: 120 mg/dL
eAG (mmol/L): 6.6 mmol/L

## 2021-11-19 LAB — PSA: PSA: 6.61 ng/mL — ABNORMAL HIGH (ref <=4.00)

## 2021-11-25 ENCOUNTER — Other Ambulatory Visit: Payer: Self-pay

## 2021-11-25 ENCOUNTER — Emergency Department
Admission: EM | Admit: 2021-11-25 | Discharge: 2021-11-26 | Discharge status: Against Medical Advice | Payer: Medicare Other | Attending: Emergency Medicine | Admitting: Emergency Medicine

## 2021-11-25 ENCOUNTER — Emergency Department: Payer: Medicare Other

## 2021-11-25 DIAGNOSIS — I10 Essential (primary) hypertension: Secondary | ICD-10-CM | POA: Diagnosis not present

## 2021-11-25 DIAGNOSIS — R6889 Other general symptoms and signs: Secondary | ICD-10-CM | POA: Diagnosis not present

## 2021-11-25 DIAGNOSIS — Z5329 Procedure and treatment not carried out because of patient's decision for other reasons: Secondary | ICD-10-CM | POA: Insufficient documentation

## 2021-11-25 DIAGNOSIS — R55 Syncope and collapse: Secondary | ICD-10-CM | POA: Diagnosis not present

## 2021-11-25 DIAGNOSIS — R404 Transient alteration of awareness: Secondary | ICD-10-CM | POA: Diagnosis not present

## 2021-11-25 DIAGNOSIS — R4182 Altered mental status, unspecified: Secondary | ICD-10-CM | POA: Diagnosis present

## 2021-11-25 DIAGNOSIS — Z79899 Other long term (current) drug therapy: Secondary | ICD-10-CM | POA: Diagnosis not present

## 2021-11-25 DIAGNOSIS — N179 Acute kidney failure, unspecified: Secondary | ICD-10-CM | POA: Diagnosis not present

## 2021-11-25 DIAGNOSIS — Z743 Need for continuous supervision: Secondary | ICD-10-CM | POA: Diagnosis not present

## 2021-11-25 LAB — CBG MONITORING, ED: Glucose-Capillary: 136 mg/dL — ABNORMAL HIGH (ref 70–99)

## 2021-11-25 MED ORDER — LACTATED RINGERS IV BOLUS
1000.0000 mL | Freq: Once | INTRAVENOUS | Status: AC
  Administered 2021-11-25: 1000 mL via INTRAVENOUS

## 2021-11-25 NOTE — ED Triage Notes (Signed)
Patient arrived from jail today at 6pm. Patent has not had his blood pressure medication for 3 days. Today he took his blood pressure medication and became altered soon after. EMS reports he is repeating the same question, unable to stand on his legs, and confused. EMS reports no ETOH or drugs. Patient states he does not remember anything, or taking blood pressure medication.

## 2021-11-25 NOTE — ED Provider Notes (Signed)
[facility] Provider Note    Event Date/Time   First MD Initiated Contact with Patient 11/25/21 2341     (approximate)   History   Altered Mental Status   HPI  Ryan [name] is a 66 y.o. male  who presents to the emergency department today because of concern for altered mental status. The patient himself states that he cannot remember what happened this evening. Does remember being released from jail earlier today. Is unsure however if he took his blood pressure medications or gabapentin since coming home. Unsure if he had dinner or not. The patient denies any headache, denies any chest pain or abdominal pain.     Physical Exam   Triage Vital Signs: ED Triage Vitals  Enc Vitals Group     BP 11/25/21 2335 (!) 86/74     Pulse Rate 11/25/21 2338 91     Resp 11/25/21 2338 16     Temp 11/25/21 2340 97.9 F (36.6 C)     Temp Source 11/25/21 2340 Oral     SpO2 11/25/21 2338 95 %     Weight 11/25/21 2336 167 lb 15.9 oz (76.2 kg)     Height 11/25/21 2336 5\' 6"  (1.676 m)     Head Circumference --      Peak Flow --      Pain Score --      Pain Loc --      Pain Edu? --      Excl. in [place]? --     Most recent vital signs: Vitals:   11/25/21 2338 11/25/21 2340  BP:    Pulse: 91   Resp: 16   Temp:  97.9 F (36.6 C)  SpO2: 95%     General: Awake, alert, not completely oriented. CV:  Good peripheral perfusion. Regular rate and rhythm. Resp:  Normal effort. Lungs clear. Abd:  No distention. Non tender.   ED Results / Procedures / Treatments   Labs (all labs ordered are listed, but only abnormal results are displayed) Labs Reviewed  CBG MONITORING, ED - Abnormal; Notable for the following components:      Result Value   Glucose-Capillary 136 (*)    All other components within normal limits  CBC WITH DIFFERENTIAL/PLATELET  COMPREHENSIVE METABOLIC PANEL  ETHANOL  URINE DRUG SCREEN, QUALITATIVE ([facility] ONLY)  URINALYSIS, COMPLETE (UACMP) WITH  MICROSCOPIC  TROPONIN I (HIGH SENSITIVITY)     EKG  I, Nance Pear, attending physician, personally viewed and interpreted this EKG  EKG Time: 2337 Rate: 93 Rhythm: sinus rhythm Axis: left axis deviation Intervals: qtc 479 QRS: LAFB, LVH ST changes: no st elevation Impression: abnormal ekg    RADIOLOGY I independently interpreted and visualized the CT head. My interpretation: *** Radiology interpretation: ***  {USE THE WORD "INTERPRETED"!! You MUST document your own interpretation of imaging, as well as the fact that you reviewed the radiologist's report!:1}   PROCEDURES:  Critical Care performed: {CriticalCareYesNo:19197::"Yes, see critical care procedure note(s)","No"}  Procedures   MEDICATIONS ORDERED IN ED: Medications  lactated ringers bolus 1,000 mL (has no administration in time range)     IMPRESSION / MDM / ASSESSMENT AND PLAN / ED COURSE  I reviewed the triage vital signs and the nursing notes.                              Differential diagnosis includes, but is not limited to, intracranial process, electrolyte abnormality,  anemia, substance use.  Patient's presentation is most consistent with acute presentation with potential threat to life or bodily function.  The patient is on the cardiac monitor to evaluate for evidence of arrhythmia and/or significant heart rate changes.  Patient presents to the emergency department today because of concern for altered mental status. On exam patient is unable to remember what happened this evening. Blood pressure is low. ***  FINAL CLINICAL IMPRESSION(S) / ED DIAGNOSES   Final diagnoses:  None     Rx / DC Orders   ED Discharge Orders     None        Note:  This document was prepared using Dragon voice recognition software and may include unintentional dictation errors.

## 2021-11-26 ENCOUNTER — Emergency Department: Payer: Medicare Other

## 2021-11-26 DIAGNOSIS — R4182 Altered mental status, unspecified: Secondary | ICD-10-CM | POA: Diagnosis not present

## 2021-11-26 LAB — URINE DRUG SCREEN, QUALITATIVE (ARMC ONLY)
Amphetamines, Ur Screen: NOT DETECTED
Barbiturates, Ur Screen: NOT DETECTED
Benzodiazepine, Ur Scrn: NOT DETECTED
Cannabinoid 50 Ng, Ur ~~LOC~~: POSITIVE — AB
Cocaine Metabolite,Ur ~~LOC~~: NOT DETECTED
MDMA (Ecstasy)Ur Screen: NOT DETECTED
Methadone Scn, Ur: NOT DETECTED
Opiate, Ur Screen: NOT DETECTED
Phencyclidine (PCP) Ur S: NOT DETECTED
Tricyclic, Ur Screen: NOT DETECTED

## 2021-11-26 LAB — CBC WITH DIFFERENTIAL/PLATELET
Abs Immature Granulocytes: 0.06 10*3/uL (ref 0.00–0.07)
Basophils Absolute: 0.1 10*3/uL (ref 0.0–0.1)
Basophils Relative: 0 %
Eosinophils Absolute: 0 10*3/uL (ref 0.0–0.5)
Eosinophils Relative: 0 %
HCT: 48.4 % (ref 39.0–52.0)
Hemoglobin: 16.3 g/dL (ref 13.0–17.0)
Immature Granulocytes: 0 %
Lymphocytes Relative: 17 %
Lymphs Abs: 2.6 10*3/uL (ref 0.7–4.0)
MCH: 31.6 pg (ref 26.0–34.0)
MCHC: 33.7 g/dL (ref 30.0–36.0)
MCV: 93.8 fL (ref 80.0–100.0)
Monocytes Absolute: 1.1 10*3/uL — ABNORMAL HIGH (ref 0.1–1.0)
Monocytes Relative: 7 %
Neutro Abs: 11.7 10*3/uL — ABNORMAL HIGH (ref 1.7–7.7)
Neutrophils Relative %: 76 %
Platelets: 273 10*3/uL (ref 150–400)
RBC: 5.16 MIL/uL (ref 4.22–5.81)
RDW: 12.4 % (ref 11.5–15.5)
WBC: 15.6 10*3/uL — ABNORMAL HIGH (ref 4.0–10.5)
nRBC: 0 % (ref 0.0–0.2)

## 2021-11-26 LAB — COMPREHENSIVE METABOLIC PANEL
ALT: 17 U/L (ref 0–44)
AST: 20 U/L (ref 15–41)
Albumin: 4.4 g/dL (ref 3.5–5.0)
Alkaline Phosphatase: 143 U/L — ABNORMAL HIGH (ref 38–126)
Anion gap: 14 (ref 5–15)
BUN: 33 mg/dL — ABNORMAL HIGH (ref 8–23)
CO2: 24 mmol/L (ref 22–32)
Calcium: 9.7 mg/dL (ref 8.9–10.3)
Chloride: 97 mmol/L — ABNORMAL LOW (ref 98–111)
Creatinine, Ser: 2.44 mg/dL — ABNORMAL HIGH (ref 0.61–1.24)
GFR, Estimated: 28 mL/min — ABNORMAL LOW (ref >60)
Glucose, Bld: 127 mg/dL — ABNORMAL HIGH (ref 70–99)
Potassium: 3.6 mmol/L (ref 3.5–5.1)
Sodium: 135 mmol/L (ref 135–145)
Total Bilirubin: 0.9 mg/dL (ref 0.3–1.2)
Total Protein: 7.4 g/dL (ref 6.5–8.1)

## 2021-11-26 LAB — URINALYSIS, COMPLETE (UACMP) WITH MICROSCOPIC
Bacteria, UA: NONE SEEN
Bilirubin Urine: NEGATIVE
Glucose, UA: NEGATIVE mg/dL
Ketones, ur: 5 mg/dL — AB
Leukocytes,Ua: NEGATIVE
Nitrite: NEGATIVE
Protein, ur: 30 mg/dL — AB
Specific Gravity, Urine: 1.01 (ref 1.005–1.030)
pH: 5 (ref 5.0–8.0)

## 2021-11-26 LAB — TROPONIN I (HIGH SENSITIVITY)
Troponin I (High Sensitivity): 40 ng/L — ABNORMAL HIGH (ref <18)
Troponin I (High Sensitivity): 51 ng/L — ABNORMAL HIGH (ref <18)

## 2021-11-26 LAB — LACTIC ACID, PLASMA
Lactic Acid, Venous: 1.7 mmol/L (ref 0.5–1.9)
Lactic Acid, Venous: 1.7 mmol/L (ref 0.5–1.9)

## 2021-11-26 LAB — ETHANOL: Alcohol, Ethyl (B): <10 mg/dL (ref <10)

## 2021-11-26 MED ORDER — LACTATED RINGERS IV BOLUS
1000.0000 mL | Freq: Once | INTRAVENOUS | Status: AC
  Administered 2021-11-26: 1000 mL via INTRAVENOUS

## 2021-11-26 MED ORDER — SODIUM CHLORIDE 0.9 % IV SOLN: Freq: Once | INTRAVENOUS | Status: DC

## 2021-11-26 NOTE — ED Notes (Signed)
Patient states he is unable to stay in the hospital. Patient states he has to take care of his niece and mother at home. Patient was able to speak with the doctor before leaving AMA. AMA form is signed.

## 2021-11-26 NOTE — Discharge Instructions (Addendum)
As we discussed the blood work today shows that your kidney is not functioning as well as it should be. We offered and recommended admission which you declined. It is very important that you contact your doctor to have your kidney values rechecked. It could be that it was due to dehydration so please be sure to drink plenty of fluids.

## 2021-11-26 NOTE — ED Notes (Addendum)
Walked into room patient was asleep.

## 2021-11-26 NOTE — ED Notes (Signed)
Back from CT

## 2021-11-26 NOTE — ED Notes (Signed)
Patient ambulated to the bathroom without assistance.

## 2021-11-26 NOTE — ED Notes (Signed)
To CT

## 2021-11-27 ENCOUNTER — Emergency Department: Payer: Medicare Other

## 2021-11-27 ENCOUNTER — Encounter: Payer: Self-pay | Admitting: Emergency Medicine

## 2021-11-27 ENCOUNTER — Other Ambulatory Visit: Payer: Self-pay

## 2021-11-27 ENCOUNTER — Observation Stay
Admission: EM | Admit: 2021-11-27 | Discharge: 2021-11-28 | Disposition: A | Discharge status: Home / Self Care | Payer: Medicare Other | Attending: Internal Medicine | Admitting: Internal Medicine

## 2021-11-27 DIAGNOSIS — N400 Enlarged prostate without lower urinary tract symptoms: Secondary | ICD-10-CM | POA: Diagnosis present

## 2021-11-27 DIAGNOSIS — Z7982 Long term (current) use of aspirin: Secondary | ICD-10-CM | POA: Diagnosis not present

## 2021-11-27 DIAGNOSIS — Z743 Need for continuous supervision: Secondary | ICD-10-CM | POA: Diagnosis not present

## 2021-11-27 DIAGNOSIS — E785 Hyperlipidemia, unspecified: Secondary | ICD-10-CM | POA: Diagnosis not present

## 2021-11-27 DIAGNOSIS — J449 Chronic obstructive pulmonary disease, unspecified: Secondary | ICD-10-CM | POA: Diagnosis not present

## 2021-11-27 DIAGNOSIS — Z720 Tobacco use: Secondary | ICD-10-CM | POA: Diagnosis present

## 2021-11-27 DIAGNOSIS — F419 Anxiety disorder, unspecified: Secondary | ICD-10-CM | POA: Insufficient documentation

## 2021-11-27 DIAGNOSIS — R42 Dizziness and giddiness: Secondary | ICD-10-CM

## 2021-11-27 DIAGNOSIS — I10 Essential (primary) hypertension: Secondary | ICD-10-CM | POA: Diagnosis not present

## 2021-11-27 DIAGNOSIS — I499 Cardiac arrhythmia, unspecified: Secondary | ICD-10-CM | POA: Diagnosis not present

## 2021-11-27 DIAGNOSIS — R55 Syncope and collapse: Principal | ICD-10-CM | POA: Insufficient documentation

## 2021-11-27 DIAGNOSIS — R6510 Systemic inflammatory response syndrome (SIRS) of non-infectious origin without acute organ dysfunction: Secondary | ICD-10-CM | POA: Diagnosis not present

## 2021-11-27 DIAGNOSIS — N189 Chronic kidney disease, unspecified: Secondary | ICD-10-CM | POA: Diagnosis not present

## 2021-11-27 DIAGNOSIS — Z8673 Personal history of transient ischemic attack (TIA), and cerebral infarction without residual deficits: Secondary | ICD-10-CM | POA: Diagnosis not present

## 2021-11-27 DIAGNOSIS — Z79899 Other long term (current) drug therapy: Secondary | ICD-10-CM | POA: Insufficient documentation

## 2021-11-27 DIAGNOSIS — I959 Hypotension, unspecified: Secondary | ICD-10-CM | POA: Diagnosis not present

## 2021-11-27 DIAGNOSIS — Z789 Other specified health status: Secondary | ICD-10-CM | POA: Diagnosis present

## 2021-11-27 DIAGNOSIS — J45909 Unspecified asthma, uncomplicated: Secondary | ICD-10-CM | POA: Diagnosis not present

## 2021-11-27 DIAGNOSIS — E876 Hypokalemia: Secondary | ICD-10-CM | POA: Diagnosis not present

## 2021-11-27 DIAGNOSIS — E860 Dehydration: Secondary | ICD-10-CM | POA: Insufficient documentation

## 2021-11-27 DIAGNOSIS — R6511 Systemic inflammatory response syndrome (SIRS) of non-infectious origin with acute organ dysfunction: Secondary | ICD-10-CM | POA: Diagnosis not present

## 2021-11-27 DIAGNOSIS — R6889 Other general symptoms and signs: Secondary | ICD-10-CM | POA: Diagnosis not present

## 2021-11-27 DIAGNOSIS — I129 Hypertensive chronic kidney disease with stage 1 through stage 4 chronic kidney disease, or unspecified chronic kidney disease: Secondary | ICD-10-CM | POA: Diagnosis not present

## 2021-11-27 DIAGNOSIS — F17210 Nicotine dependence, cigarettes, uncomplicated: Secondary | ICD-10-CM | POA: Diagnosis not present

## 2021-11-27 DIAGNOSIS — N179 Acute kidney failure, unspecified: Secondary | ICD-10-CM | POA: Diagnosis not present

## 2021-11-27 DIAGNOSIS — R404 Transient alteration of awareness: Secondary | ICD-10-CM | POA: Diagnosis not present

## 2021-11-27 LAB — COMPREHENSIVE METABOLIC PANEL
ALT: 14 U/L (ref 0–44)
AST: 19 U/L (ref 15–41)
Albumin: 3.9 g/dL (ref 3.5–5.0)
Alkaline Phosphatase: 121 U/L (ref 38–126)
Anion gap: 10 (ref 5–15)
BUN: 42 mg/dL — ABNORMAL HIGH (ref 8–23)
CO2: 25 mmol/L (ref 22–32)
Calcium: 8.6 mg/dL — ABNORMAL LOW (ref 8.9–10.3)
Chloride: 102 mmol/L (ref 98–111)
Creatinine, Ser: 1.65 mg/dL — ABNORMAL HIGH (ref 0.61–1.24)
GFR, Estimated: 46 mL/min — ABNORMAL LOW (ref >60)
Glucose, Bld: 118 mg/dL — ABNORMAL HIGH (ref 70–99)
Potassium: 3.2 mmol/L — ABNORMAL LOW (ref 3.5–5.1)
Sodium: 137 mmol/L (ref 135–145)
Total Bilirubin: 0.7 mg/dL (ref 0.3–1.2)
Total Protein: 6.7 g/dL (ref 6.5–8.1)

## 2021-11-27 LAB — URINE DRUG SCREEN, QUALITATIVE (ARMC ONLY)
Amphetamines, Ur Screen: NOT DETECTED
Barbiturates, Ur Screen: NOT DETECTED
Benzodiazepine, Ur Scrn: NOT DETECTED
Cannabinoid 50 Ng, Ur ~~LOC~~: POSITIVE — AB
Cocaine Metabolite,Ur ~~LOC~~: POSITIVE — AB
MDMA (Ecstasy)Ur Screen: NOT DETECTED
Methadone Scn, Ur: NOT DETECTED
Opiate, Ur Screen: NOT DETECTED
Phencyclidine (PCP) Ur S: NOT DETECTED
Tricyclic, Ur Screen: NOT DETECTED

## 2021-11-27 LAB — CBC WITH DIFFERENTIAL/PLATELET
Abs Immature Granulocytes: 0.05 10*3/uL (ref 0.00–0.07)
Basophils Absolute: 0 10*3/uL (ref 0.0–0.1)
Basophils Relative: 0 %
Eosinophils Absolute: 0.2 10*3/uL (ref 0.0–0.5)
Eosinophils Relative: 1 %
HCT: 40.5 % (ref 39.0–52.0)
Hemoglobin: 13.6 g/dL (ref 13.0–17.0)
Immature Granulocytes: 0 %
Lymphocytes Relative: 21 %
Lymphs Abs: 2.7 10*3/uL (ref 0.7–4.0)
MCH: 31.6 pg (ref 26.0–34.0)
MCHC: 33.6 g/dL (ref 30.0–36.0)
MCV: 94.2 fL (ref 80.0–100.0)
Monocytes Absolute: 0.9 10*3/uL (ref 0.1–1.0)
Monocytes Relative: 7 %
Neutro Abs: 8.7 10*3/uL — ABNORMAL HIGH (ref 1.7–7.7)
Neutrophils Relative %: 71 %
Platelets: 217 10*3/uL (ref 150–400)
RBC: 4.3 MIL/uL (ref 4.22–5.81)
RDW: 12.7 % (ref 11.5–15.5)
WBC: 12.6 10*3/uL — ABNORMAL HIGH (ref 4.0–10.5)
nRBC: 0 % (ref 0.0–0.2)

## 2021-11-27 LAB — URINALYSIS, ROUTINE W REFLEX MICROSCOPIC
Bilirubin Urine: NEGATIVE
Glucose, UA: NEGATIVE mg/dL
Hgb urine dipstick: NEGATIVE
Ketones, ur: NEGATIVE mg/dL
Leukocytes,Ua: NEGATIVE
Nitrite: NEGATIVE
Protein, ur: NEGATIVE mg/dL
Specific Gravity, Urine: 1.01 (ref 1.005–1.030)
pH: 5 (ref 5.0–8.0)

## 2021-11-27 LAB — MAGNESIUM: Magnesium: 1.9 mg/dL (ref 1.7–2.4)

## 2021-11-27 LAB — PHOSPHORUS: Phosphorus: 4.3 mg/dL (ref 2.5–4.6)

## 2021-11-27 LAB — TROPONIN I (HIGH SENSITIVITY): Troponin I (High Sensitivity): 15 ng/L (ref <18)

## 2021-11-27 LAB — PROCALCITONIN: Procalcitonin: <0.1 ng/mL

## 2021-11-27 LAB — ETHANOL: Alcohol, Ethyl (B): <10 mg/dL (ref <10)

## 2021-11-27 MED ORDER — ATORVASTATIN CALCIUM 20 MG PO TABS
80.0000 mg | ORAL_TABLET | Freq: Every day | ORAL | Status: DC
  Administered 2021-11-27: 80 mg via ORAL
  Filled 2021-11-27: qty 4

## 2021-11-27 MED ORDER — NICOTINE 21 MG/24HR TD PT24
21.0000 mg | MEDICATED_PATCH | Freq: Every day | TRANSDERMAL | Status: DC
  Administered 2021-11-27 – 2021-11-28 (×2): 21 mg via TRANSDERMAL
  Filled 2021-11-27 (×2): qty 1

## 2021-11-27 MED ORDER — TAMSULOSIN HCL 0.4 MG PO CAPS
0.4000 mg | ORAL_CAPSULE | Freq: Every day | ORAL | Status: DC
  Administered 2021-11-27: 0.4 mg via ORAL
  Filled 2021-11-27: qty 1

## 2021-11-27 MED ORDER — DM-GUAIFENESIN ER 30-600 MG PO TB12
1.0000 | ORAL_TABLET | Freq: Two times a day (BID) | ORAL | Status: DC | PRN
  Administered 2021-11-27: 1 via ORAL
  Filled 2021-11-27: qty 1

## 2021-11-27 MED ORDER — POTASSIUM CHLORIDE CRYS ER 20 MEQ PO TBCR
40.0000 meq | EXTENDED_RELEASE_TABLET | Freq: Once | ORAL | Status: AC
  Administered 2021-11-27: 40 meq via ORAL
  Filled 2021-11-27: qty 2

## 2021-11-27 MED ORDER — GABAPENTIN 300 MG PO CAPS
400.0000 mg | ORAL_CAPSULE | Freq: Two times a day (BID) | ORAL | Status: DC
  Administered 2021-11-27 – 2021-11-28 (×3): 400 mg via ORAL
  Filled 2021-11-27 (×3): qty 1

## 2021-11-27 MED ORDER — SODIUM CHLORIDE 0.9 % IV SOLN: INTRAVENOUS | Status: DC

## 2021-11-27 MED ORDER — ASPIRIN 81 MG PO TBEC
81.0000 mg | DELAYED_RELEASE_TABLET | Freq: Every day | ORAL | Status: DC
  Administered 2021-11-28: 81 mg via ORAL
  Filled 2021-11-27: qty 1

## 2021-11-27 MED ORDER — ALBUTEROL SULFATE (2.5 MG/3ML) 0.083% IN NEBU: 3.0000 mL | INHALATION_SOLUTION | RESPIRATORY_TRACT | Status: DC | PRN

## 2021-11-27 MED ORDER — ACETAMINOPHEN 325 MG PO TABS
650.0000 mg | ORAL_TABLET | Freq: Four times a day (QID) | ORAL | Status: DC | PRN
  Administered 2021-11-28: 650 mg via ORAL
  Filled 2021-11-27: qty 2

## 2021-11-27 MED ORDER — ENOXAPARIN SODIUM 40 MG/0.4ML IJ SOSY
40.0000 mg | PREFILLED_SYRINGE | INTRAMUSCULAR | Status: DC
  Administered 2021-11-27: 40 mg via SUBCUTANEOUS
  Filled 2021-11-27: qty 0.4

## 2021-11-27 MED ORDER — SODIUM CHLORIDE 0.9 % IV SOLN
1.0000 g | INTRAVENOUS | Status: DC
  Filled 2021-11-27: qty 10

## 2021-11-27 MED ORDER — LACTATED RINGERS IV BOLUS
1000.0000 mL | Freq: Once | INTRAVENOUS | Status: AC
  Administered 2021-11-27: 1000 mL via INTRAVENOUS

## 2021-11-27 MED ORDER — ONDANSETRON HCL 4 MG/2ML IJ SOLN: 4.0000 mg | Freq: Three times a day (TID) | INTRAMUSCULAR | Status: DC | PRN

## 2021-11-27 NOTE — ED Provider Notes (Signed)
[facility] Provider Note    Event Date/Time   First MD Initiated Contact with Patient 11/27/21 [phone]     (approximate)   History   Chief Complaint Hypotension   HPI  Ryan [name] is a 66 y.o. male with past medical history of hypertension, hyperlipidemia, COPD, and stroke who presents to the ED complaining of lightheadedness.  Patient reports that he woke up this morning feeling very dizzy and lightheaded with "rushing in my head."  He states that symptoms were especially bad when he went to stand up.  He denies any chest pain or shortness of breath, has not had any fevers, cough, nausea, vomiting, or diarrhea.  He reports being seen for similar symptoms in the ED a couple of days ago, was told that his kidney function was off at the time and that he needed to be admitted to the hospital, however patient declined.  He was told to drink plenty of water, but states that he has not done so since returning home because he "does not like water."  EMS reports that patient attempted to stand up and get on the stretcher with them, subsequently lost consciousness, did not fall to the ground or hit his head.  Initial BP was 88/56 and patient given 500 cc IV fluid bolus with improvement.  Patient denies alcohol or drug use.      Physical Exam   Triage Vital Signs: ED Triage Vitals  Enc Vitals Group     BP 11/27/21 0832 129/75     Pulse Rate 11/27/21 0832 88     Resp 11/27/21 0832 18     Temp 11/27/21 0832 97.7 F (36.5 C)     Temp Source 11/27/21 0832 Oral     SpO2 11/27/21 0832 96 %     Weight --      Height --      Head Circumference --      Peak Flow --      Pain Score 11/27/21 0833 0     Pain Loc --      Pain Edu? --      Excl. in [place]? --     Most recent vital signs: Vitals:   11/27/21 0832 11/27/21 1000  BP: 129/75 131/84  Pulse: 88 (!) 102  Resp: 18 (!) 29  Temp: 97.7 F (36.5 C)   SpO2: 96% 92%    Constitutional: Alert and oriented. Eyes:  Conjunctivae are normal. Head: Atraumatic. Nose: No congestion/rhinnorhea. Mouth/Throat: Mucous membranes are dry. Cardiovascular: Normal rate, regular rhythm. Grossly normal heart sounds.  2+ radial pulses bilaterally. Respiratory: Normal respiratory effort.  No retractions. Lungs CTAB. Gastrointestinal: Soft and nontender. No distention. Musculoskeletal: No lower extremity tenderness nor edema.  Neurologic:  Normal speech and language. No gross focal neurologic deficits are appreciated.    ED Results / Procedures / Treatments   Labs (all labs ordered are listed, but only abnormal results are displayed) Labs Reviewed  CBC WITH DIFFERENTIAL/PLATELET - Abnormal; Notable for the following components:      Result Value   WBC 12.6 (*)    Neutro Abs 8.7 (*)    All other components within normal limits  COMPREHENSIVE METABOLIC PANEL - Abnormal; Notable for the following components:   Potassium 3.2 (*)    Glucose, Bld 118 (*)    BUN 42 (*)    Creatinine, Ser 1.65 (*)    Calcium 8.6 (*)    GFR, Estimated 46 (*)    All  other components within normal limits  URINALYSIS, ROUTINE W REFLEX MICROSCOPIC - Abnormal; Notable for the following components:   Color, Urine YELLOW (*)    APPearance CLEAR (*)    All other components within normal limits  ETHANOL  MAGNESIUM  URINE DRUG SCREEN, QUALITATIVE ([facility] ONLY)  TROPONIN I (HIGH SENSITIVITY)     EKG  ED ECG REPORT I, Chesley Noon, the attending physician, personally viewed and interpreted this ECG.   Date: 11/27/2021  EKG Time: 8:43  Rate: 87  Rhythm: normal sinus rhythm  Axis: LAD  Intervals:left anterior fascicular block  ST&T Change: Lateral T wave inversions  RADIOLOGY Chest x-ray reviewed and interpreted by me with no infiltrate, edema, or effusion.  PROCEDURES:  Critical Care performed: No  Procedures   MEDICATIONS ORDERED IN ED: Medications  lactated ringers bolus 1,000 mL (1,000 mLs Intravenous New Bag/Given  11/27/21 0846)  potassium chloride SA (KLOR-CON M) CR tablet 40 mEq (40 mEq Oral Given 11/27/21 1021)     IMPRESSION / MDM / ASSESSMENT AND PLAN / ED COURSE  I reviewed the triage vital signs and the nursing notes.                              66 y.o. male with past medical history of hypertension, hyperlipidemia, COPD, and stroke who presents to the ED complaining of dizziness and lightheadedness over the past couple of days, noted to be hypotensive with EMS.  Patient's presentation is most consistent with acute presentation with potential threat to life or bodily function.  Differential diagnosis includes, but is not limited to, sepsis, ACS, CHF, pneumonia, UTI, dehydration, electrolyte abnormality, AKI, medication effect, substance abuse.  Patient nontoxic-appearing and in no acute distress, blood pressure seems to have improved following IV fluid bolus with EMS.  Reviewing his recent ED visit, patient was noted to have significant AKI and admission was recommended, but he declined.  I do suspect dehydration is playing a significant role in patient's presentation today given he states he drinks very little water at home.  Lower suspicion for sepsis given reassuring vital signs, will screen chest x-ray and urinalysis for infectious process.  EKG shows no evidence of arrhythmia or ischemia, will screen troponin and observe on cardiac monitor.  Medications may be playing a role as patient arrives with pill bottles containing Ativan, Flomax, and sildenafil, but is not sure if or when he has been taking these.  Plan to hydrate with IV fluids and reassess following results.  Blood pressure remained stable following additional IV fluids, labs remarkable for ongoing AKI, although improved from visit 2 days ago.  Remainder of labs are reassuring with no significant electrolyte abnormality, leukocytosis, or anemia.  Troponin within normal limits, no arrhythmia noted on cardiac monitor.  Patient would  benefit from further observation and IV fluid hydration, case discussed with hospitalist for admission.      FINAL CLINICAL IMPRESSION(S) / ED DIAGNOSES   Final diagnoses:  Syncope, unspecified syncope type  Dehydration  AKI (acute kidney injury) (HCC)     Rx / DC Orders   ED Discharge Orders     None        Note:  This document was prepared using Dragon voice recognition software and may include unintentional dictation errors.   Chesley Noon, MD 11/27/21 1039

## 2021-11-27 NOTE — ED Notes (Signed)
Patient to xray at this time

## 2021-11-27 NOTE — ED Triage Notes (Signed)
Patient to ED via [facility] from home for hypotension. Patient states that his doctors have recently being changing his BP meds for same complaint. Patient's BP was initially 88/56 and when unconscious with EMS upon stating. Pt received 536mL NaCL with EMS with last BP on 111/59.

## 2021-11-27 NOTE — ED Notes (Signed)
Pt up to bedside toilet.  

## 2021-11-27 NOTE — ED Notes (Signed)
Pt given 2 warm blankets

## 2021-11-27 NOTE — ED Notes (Signed)
Dietary team member placed pt's lunch tray at bedside.

## 2021-11-27 NOTE — H&P (Signed)
History and Physical    Ryan [name] [id number] DOB: [birth year] DOA: 11/27/2021  Referring MD/NP/PA:   PCP: Olin Hauser, DO   Patient coming from:  The patient is coming from home.  At baseline, pt is independent for most of ADL.        Chief Complaint: Lightheadedness, near syncope  HPI: Ryan [name] is a 66 y.o. 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  Patient was seen in ED on 11/1 due to confusion.  Patient had negative CT head and chest x-ray.  Patient was found to have AKI.  He received IV fluid.  Per EDP's note, pt refused admission. Today pt comes back due to lightheadedness and near syncope.  Patient states that he feels that he is going to pass out, but did not.  Denies unilateral numbness or tinglings in extremities, no facial droop or slurred speech.  Per report, patient was found to have hypotension with blood pressure 88/56.  He received 500 cc normal saline bolus by EMS.  His blood pressure is 131/84 when I saw patient in ED. Patient has mild dry cough, denies shortness breath or chest pain.  No tenderness in the calf areas.  Patient does not have nausea, vomiting, diarrhea or abdominal pain.  He states that he has difficulty urinating and dysuria if he does not take his BPH medication Flomax.  Data reviewed independently and ED Course: pt was found to have WBC 12.6, lactic acid 1.7, troponin level was 40 yesterday --> 15 today.  UDS positive for cannabinoid.  Urinalysis yesterday (with cloudy appearance, WBC 6-10, negative leukocyte, negative bacteria), but UA is negative today. AKI with creatinine 1.6 5, BUN 42, GFR 46 (recent baseline creatinine 0.98 on 11/18/2021), potassium 3.2, magnesium 1.9.  Temperature normal, blood pressure 131/84 currently, heart rate 90-100, RR  29, 20, oxygen saturation 92-97% on room air.  Chest x-ray negative today.  Patient is placed on telemetry bed for observation   EKG: I have personally reviewed.  Sinus rhythm, QTc 473, LAD, poor R wave progression, unstable baseline.   Review of Systems:   General: no fevers, chills, no body weight gain, has fatigue HEENT: no blurry vision, sore throat. HOH Respiratory: no dyspnea, has mild coughing, no wheezing CV: no chest pain, no palpitations GI: no nausea, vomiting, abdominal pain, diarrhea, constipation GU: has dysuria, no burning on urination, increased urinary frequency, hematuria  Ext: no leg edema Neuro: no unilateral weakness, numbness, or tingling, no vision change or hearing loss. Has lightheadedness and near syncope Skin: no rash, no skin tear. MSK: No muscle spasm, no deformity, no limitation of range of movement in spin Heme: No easy bruising.  Travel history: No recent long distant travel.   Allergy: No Known Allergies  Past Medical History:  Diagnosis Date   Anxiety    Arthritis    left hand   Asthma    Chronic kidney disease    HAS HAD KIDNEY STONE 2015-- JUST PASSED   COPD (chronic obstructive pulmonary disease) (HCC)    Dyspnea    GERD (gastroesophageal reflux disease)    TAKES TUMS FOR RELIEF   Hyperlipidemia    Hypertension    Improved, No longer on meds   Lower urinary tract symptoms (LUTS)    Prostate disease    Stroke [facility])    Thyroid disease  Wears dentures    hass full upper plate, not wearing, broken   Wears hearing aid     Past Surgical History:  Procedure Laterality Date   BACK SURGERY     CATARACT EXTRACTION W/PHACO Left 01/06/2016   Procedure: CATARACT EXTRACTION PHACO AND INTRAOCULAR LENS PLACEMENT (IOC);  Surgeon: Eulogio Bear, MD;  Location: [facility];  Service: Ophthalmology;  Laterality: Left;  LEFT   COLONOSCOPY WITH PROPOFOL N/A 04/19/2016   Procedure: COLONOSCOPY WITH PROPOFOL;  Surgeon: Manya Silvas,  MD;  Location: [facility] ENDOSCOPY;  Service: Endoscopy;  Laterality: N/A;   ESOPHAGOGASTRODUODENOSCOPY (EGD) WITH PROPOFOL N/A 10/24/2015   Procedure: ESOPHAGOGASTRODUODENOSCOPY (EGD) WITH PROPOFOL;  Surgeon: Manya Silvas, MD;  Location: [facility] ENDOSCOPY;  Service: Endoscopy;  Laterality: N/A;   EYE SURGERY     HERNIA REPAIR     ERRONEOUS UMBILICAL HERNIA   [id number]   MYRINGOTOMY WITH TUBE PLACEMENT Bilateral 03/28/2015   Procedure: MYRINGOTOMY WITH TUBE PLACEMENT;  Surgeon: Beverly Gust, MD;  Location: [place];  Service: ENT;  Laterality: Bilateral;  BUTTERFLY TUBE   SINUSOTOMY  02/28/2019   TONSILLECTOMY     TRANSFORAMINAL LUMBAR INTERBODY FUSION (TLIF) WITH PEDICLE SCREW FIXATION 1 LEVEL Right 06/18/2014   Procedure: TRANSFORAMINAL LUMBAR INTERBODY FUSION (TLIF) WITH PEDICLE SCREW FIXATION 1 LEVEL LUMBAR 5 -SACRAL 1;  Surgeon: Karie Chimera, MD;  Location: [place] NEURO ORS;  Service: Neurosurgery;  Laterality: Right;  Right transforaminal lumbar interbody fusion with interbody prosthesis and percutaneous pedicle screws Lumbar 5 to Sacral 1    Social History:  reports that he has been smoking cigarettes. He has a 60.00 pack-year smoking history. He uses smokeless tobacco. He reports current alcohol use.  Drug: Marijuana.  Family History:  Family History  Problem Relation Age of Onset   Arthritis Mother    Hypertension Mother    Diabetes Father    Hypertension Father      Prior to Admission medications   Medication Sig Start Date End Date Taking? Authorizing Provider  albuterol (PROVENTIL) (2.5 MG/3ML) 0.083% nebulizer solution Take 3 mLs (2.5 mg total) by nebulization every 6 (six) hours as needed for wheezing or shortness of breath. 11/18/21   Karamalegos, Devonne Doughty, DO  albuterol (VENTOLIN HFA) 108 (90 Base) MCG/ACT inhaler Inhale 2 puffs into the lungs every 6 (six) hours as needed for wheezing or shortness of breath. 11/18/21   Karamalegos, Devonne Doughty, DO  ALPRAZolam Duanne Moron) 1  MG tablet Take 1 mg by mouth 2 (two) times daily as needed. 09/20/20   [provider]  aspirin EC 81 MG EC tablet Take 1 tablet (81 mg total) by mouth daily. Swallow whole. 09/13/19   Shahmehdi, Valeria Batman, MD  atorvastatin (LIPITOR) 80 MG tablet Take 1 tablet (80 mg total) by mouth at bedtime. 11/18/21   Karamalegos, Alexander J, DO  BREZTRI AEROSPHERE 160-9-4.8 MCG/ACT AERO Inhale 2 puffs into the lungs 2 (two) times daily. 11/18/21   Karamalegos, Devonne Doughty, DO  cetirizine (ZYRTEC) 10 MG tablet Take 10 mg by mouth daily.    [provider]  gabapentin (NEURONTIN) 400 MG capsule Take 400 mg by mouth in the morning and at bedtime. 400 mg twice a day as per patient --psychiatrist changed that Normally takes 400 mg daily 09/29/19   [provider]  lisinopril (ZESTRIL) 20 MG tablet Take 1 tablet (20 mg total) by mouth daily. 11/18/21   Karamalegos, Devonne Doughty, DO  sildenafil (REVATIO) 20 MG tablet Take 4-5 tablets (80-100 mg  total) by mouth as needed (ED). 11/18/21   Karamalegos, Devonne Doughty, DO  tamsulosin (FLOMAX) 0.4 MG CAPS capsule Take 1 capsule (0.4 mg total) by mouth in the morning and at bedtime. 11/18/21   Olin Hauser, DO    Physical Exam: Vitals:   11/27/21 1227 11/27/21 1230 11/27/21 1249 11/27/21 1316  BP:  113/71  114/68  Pulse:    92  Resp: 16  18 20   Temp:    98 F (36.7 C)  TempSrc:      SpO2:    92%   General: Not in acute distress.  Dry mucosal membranes  HEENT:       Eyes: PERRL, EOMI, no scleral icterus.       ENT: No discharge from the ears and nose, no pharynx injection, no tonsillar enlargement.        Neck: No JVD, no bruit, no mass felt. Heme: No neck lymph node enlargement. Cardiac: S1/S2, RRR, No murmurs, No gallops or rubs. Respiratory: No rales, wheezing, rhonchi or rubs. GI: Soft, nondistended, nontender, no rebound pain, no organomegaly, BS present. GU: No hematuria Ext: No pitting leg edema bilaterally. 1+DP/PT pulse  bilaterally. Musculoskeletal: No joint deformities, No joint redness or warmth, no limitation of ROM in spin. Skin: No rashes.  Neuro: Alert, oriented X3, cranial nerves II-XII grossly intact, moves all extremities normally. Psych: Patient is not psychotic, no suicidal or hemocidal ideation.  Labs on Admission: I have personally reviewed following labs and imaging studies  CBC: Recent Labs  Lab 11/25/21 2342 11/27/21 0832  WBC 15.6* 12.6*  NEUTROABS 11.7* 8.7*  HGB 16.3 13.6  HCT 48.4 40.5  MCV 93.8 94.2  PLT 273 [id number]   Basic Metabolic Panel: Recent Labs  Lab 11/25/21 2342 11/27/21 0832  NA 135 137  K 3.6 3.2*  CL 97* 102  CO2 24 25  GLUCOSE 127* 118*  BUN 33* 42*  CREATININE 2.44* 1.65*  CALCIUM 9.7 8.6*  MG  --  1.9  PHOS  --  4.3   GFR: Estimated Creatinine Clearance: 39.7 mL/min (A) (by C-G formula based on SCr of 1.65 mg/dL (H)). Liver Function Tests: Recent Labs  Lab 11/25/21 2342 11/27/21 0832  AST 20 19  ALT 17 14  ALKPHOS 143* 121  BILITOT 0.9 0.7  PROT 7.4 6.7  ALBUMIN 4.4 3.9   No results for input(s): "LIPASE", "AMYLASE" in the last 168 hours. No results for input(s): "AMMONIA" in the last 168 hours. Coagulation Profile: No results for input(s): "INR", "PROTIME" in the last 168 hours. Cardiac Enzymes: No results for input(s): "CKTOTAL", "CKMB", "CKMBINDEX", "TROPONINI" in the last 168 hours. BNP (last 3 results) No results for input(s): "PROBNP" in the last 8760 hours. HbA1C: No results for input(s): "HGBA1C" in the last 72 hours. CBG: Recent Labs  Lab 11/25/21 2336  GLUCAP 136*   Lipid Profile: No results for input(s): "CHOL", "HDL", "LDLCALC", "TRIG", "CHOLHDL", "LDLDIRECT" in the last 72 hours. Thyroid Function Tests: No results for input(s): "TSH", "T4TOTAL", "FREET4", "T3FREE", "THYROIDAB" in the last 72 hours. Anemia Panel: No results for input(s): "VITAMINB12", "FOLATE", "FERRITIN", "TIBC", "IRON", "RETICCTPCT" in the last 72  hours. Urine analysis:    Component Value Date/Time   COLORURINE YELLOW (A) 11/27/2021 0832   APPEARANCEUR CLEAR (A) 11/27/2021 0832   APPEARANCEUR Cloudy 07/15/2011 1713   LABSPEC 1.010 11/27/2021 0832   LABSPEC 1.019 07/15/2011 1713   PHURINE 5.0 11/27/2021 0832   GLUCOSEU NEGATIVE 11/27/2021 0832   GLUCOSEU Negative 07/15/2011 1713  HGBUR NEGATIVE 11/27/2021 [place] 11/27/2021 0832   BILIRUBINUR Negative 07/15/2011 1713   KETONESUR NEGATIVE 11/27/2021 0832   PROTEINUR NEGATIVE 11/27/2021 0832   NITRITE NEGATIVE 11/27/2021 0832   LEUKOCYTESUR NEGATIVE 11/27/2021 0832   LEUKOCYTESUR Negative 07/15/2011 1713   Sepsis Labs: @LABRCNTIP (procalcitonin:4,lacticidven:4) ) Recent Results (from the past 240 hour(s))  Blood culture (routine x 2)     Status: None (Preliminary result)   Collection Time: 11/26/21  1:15 AM   Specimen: BLOOD  Result Value Ref Range Status   Specimen Description BLOOD BLOOD RIGHT ARM  Final   Special Requests   Final    BOTTLES DRAWN AEROBIC AND ANAEROBIC Blood Culture adequate volume   Culture   Final    NO GROWTH 1 DAY Performed at [facility], [address] [place], [place] [address]    Report Status PENDING  Incomplete  Blood culture (routine x 2)     Status: None (Preliminary result)   Collection Time: 11/26/21  1:17 AM   Specimen: BLOOD  Result Value Ref Range Status   Specimen Description BLOOD BLOOD LEFT ARM  Final   Special Requests   Final    BOTTLES DRAWN AEROBIC AND ANAEROBIC Blood Culture adequate volume   Culture   Final    NO GROWTH 1 DAY Performed at [facility], [address], [place], [place] [address]    Report Status PENDING  Incomplete     Radiological Exams on Admission: DG Chest 2 View  Result Date: 11/27/2021 CLINICAL DATA:  Hypotension EXAM: CHEST - 2 VIEW COMPARISON:  Yesterday FINDINGS: Normal heart size and mediastinal contours. There is no edema, consolidation, effusion,  or pneumothorax. Artifact from EKG leads. IMPRESSION: No active cardiopulmonary disease. Electronically Signed   By: Jorje Guild M.D.   On: 11/27/2021 09:03   DG Chest Portable 1 View  Result Date: 11/26/2021 CLINICAL DATA:  Altered mental status EXAM: PORTABLE CHEST 1 VIEW COMPARISON:  None Available. FINDINGS: The heart size and mediastinal contours are within normal limits. Both lungs are clear. The visualized skeletal structures are unremarkable. IMPRESSION: No active disease. Electronically Signed   By: Fidela Salisbury M.D.   On: 11/26/2021 02:57   CT Head Wo Contrast  Result Date: 11/26/2021 CLINICAL DATA:  Altered mental status EXAM: CT HEAD WITHOUT CONTRAST TECHNIQUE: Contiguous axial images were obtained from the base of the skull through the vertex without intravenous contrast. RADIATION DOSE REDUCTION: This exam was performed according to the departmental dose-optimization program which includes automated exposure control, adjustment of the mA and/or kV according to patient size and/or use of iterative reconstruction technique. COMPARISON:  07/04/2020 FINDINGS: Brain: Large old right posterior temporal infarct with encephalomalacia, unchanged. No acute intracranial abnormality. Specifically, no hemorrhage, hydrocephalus, mass lesion, acute infarction, or significant intracranial injury. Vascular: No hyperdense vessel or unexpected calcification. Skull: No acute calvarial abnormality. Sinuses/Orbits: Mucosal thickening.  No acute findings. Other: None IMPRESSION: No acute intracranial abnormality. Old right MCA infarct Electronically Signed   By: Rolm Baptise M.D.   On: 11/26/2021 00:28      Assessment/Plan Principal Problem:   Near syncope Active Problems:   Hypotension   SIRS (systemic inflammatory response syndrome) (HCC)   AKI (acute kidney injury) ([place])   Essential hypertension   COPD (chronic obstructive pulmonary disease) (HCC)   History of CVA (cerebrovascular accident)  without residual deficits   HLD (hyperlipidemia)   Hypokalemia   BPH (benign prostatic hyperplasia)   Tobacco abuse  Alcohol use   Assessment and Plan:  Near syncope: CT head was negative yesterday.  No focal neurodeficit on physical examination.  Possibly due to hypotension.  Etiology for hypotension is not completely clear, but likely due to dehydration.  Patient blood pressure responded to IV fluid resuscitation.  Patient does not have chest pain or shortness of breath, no oxygen desaturation, low suspicions for PE.  - Place on tele bed for obs - Orthostatic vital signs  - Neuro checks  - IVF: 2 L of LE, then NS 75 cc/h - PT eval and treat  Hypotension: Possibly due to dehydration. -IV fluid as above -Hold lisinopril  SIRS (systemic inflammatory response syndrome) [facility]): Patient meets criteria for SIRS with WBC 12.6, tachycardia with heart rate of 102, RR 29.  Lactic acid is normal.  So far no source of infection identified.  Urinalysis negative today.  Chest x-ray negative. -IV fluid as above -Follow-up urine culture and blood culture -Check procalcitonin level  AKI (acute kidney injury) ([place]): Likely due to dehydration.  ATN is also possible due to hypotension. -Avoid using renal toxic medications. 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 -Did counseling about importance of quitting alcohol use    DVT ppx: SQ Lovenox  Code Status: Full code  Family  Communication: I have tried to call his grandson without success, could not leave message since mailbox is not set up  Disposition Plan:  Anticipate discharge back to previous environment  Consults called:  none  Admission status and Level of care: Telemetry Medical:   for obs      Dispo: The patient is from: Home              Anticipated d/c is to: Home              Anticipated d/c date is: 1 day              Patient currently is not medically stable to d/c.    Severity of Illness:  The appropriate patient status for this patient is OBSERVATION. Observation status is judged to be reasonable and necessary in order to provide the required intensity of service to ensure the patient's safety. The patient's presenting symptoms, physical exam findings, and initial radiographic and laboratory data in the context of their medical condition is felt to place them at decreased risk for further clinical deterioration. Furthermore, it is anticipated that the patient will be medically stable for discharge from the hospital within 2 midnights of admission.        Date of Service 11/27/2021    Ivor Costa [facility]   If 7PM-7AM, please contact night-coverage [email or web address] 11/27/2021, 2:02 PM

## 2021-11-27 NOTE — ED Notes (Signed)
Pt easily woken with voice; introduced self to pt; pt denies CP; reports chronic back pain and that he gets a mild HA every day; denies HA currently.

## 2021-11-27 NOTE — ED Notes (Signed)
Informed RN bed assigned 

## 2021-11-28 DIAGNOSIS — R55 Syncope and collapse: Secondary | ICD-10-CM

## 2021-11-28 LAB — CBC
HCT: 39.8 % (ref 39.0–52.0)
Hemoglobin: 13.4 g/dL (ref 13.0–17.0)
MCH: 31.9 pg (ref 26.0–34.0)
MCHC: 33.7 g/dL (ref 30.0–36.0)
MCV: 94.8 fL (ref 80.0–100.0)
Platelets: 182 10*3/uL (ref 150–400)
RBC: 4.2 MIL/uL — ABNORMAL LOW (ref 4.22–5.81)
RDW: 12.4 % (ref 11.5–15.5)
WBC: 10.2 10*3/uL (ref 4.0–10.5)
nRBC: 0 % (ref 0.0–0.2)

## 2021-11-28 LAB — BASIC METABOLIC PANEL
Anion gap: 6 (ref 5–15)
BUN: 22 mg/dL (ref 8–23)
CO2: 24 mmol/L (ref 22–32)
Calcium: 8.4 mg/dL — ABNORMAL LOW (ref 8.9–10.3)
Chloride: 106 mmol/L (ref 98–111)
Creatinine, Ser: 0.87 mg/dL (ref 0.61–1.24)
GFR, Estimated: >60 mL/min (ref >60)
Glucose, Bld: 92 mg/dL (ref 70–99)
Potassium: 3.8 mmol/L (ref 3.5–5.1)
Sodium: 136 mmol/L (ref 135–145)

## 2021-11-28 LAB — URINE CULTURE: Culture: NO GROWTH

## 2021-11-28 LAB — HIV ANTIBODY (ROUTINE TESTING W REFLEX): HIV Screen 4th Generation wRfx: NONREACTIVE

## 2021-11-28 MED ORDER — ALPRAZOLAM 1 MG PO TABS: 1.0000 mg | ORAL_TABLET | Freq: Two times a day (BID) | ORAL | 0 refills | Status: DC | PRN

## 2021-11-28 NOTE — Progress Notes (Signed)
Pt discharged home with family. PIV and tele removed. Pt home medications returned from pharmacy. Discharge instructions reviewed with patient, verbalize understanding.

## 2021-11-28 NOTE — Plan of Care (Signed)

## 2021-11-28 NOTE — Discharge Summary (Signed)
Physician Discharge Summary   Patient: Ryan [name] MRN: [id number] DOB: [birth year]  Admit date:     11/27/2021  Discharge date: 11/29/21  Discharge Physician: Pennie BanterKelly A Jazarah Capili   PCP: Smitty CordsKaramalegos, Alexander J, DO   Recommendations at discharge:    Follow up with PCP in 1-2 weeks Repeat BMP in 1-2 weeks Follow up on management of anxiety Follow up on Blood Pressure. Continued dose of lisinopril at 20 mg.  Pt was hypotensive due to dehydration, but advised to monitor BP at home.  He was given hold instructions in case of low BP or dizziness at home.  Discharge Diagnoses: Principal Problem:   Near syncope Active Problems:   Hypotension   SIRS (systemic inflammatory response syndrome) (HCC)   AKI (acute kidney injury) (HCC)   Essential hypertension   COPD (chronic obstructive pulmonary disease) (HCC)   History of CVA (cerebrovascular accident) without residual deficits   HLD (hyperlipidemia)   Hypokalemia   BPH (benign prostatic hyperplasia)   Tobacco abuse   Alcohol use  Resolved Problems:   * No resolved hospital problems. [facility]*  Hospital Course: From H&P: "Ryan [name] is a 66 y.o. 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   Patient was seen in ED on 11/1 due to confusion.  Patient had negative CT head and chest x-ray.  Patient was found to have AKI.  He received IV fluid.  Per EDP's note, pt refused admission. Today pt comes back due to lightheadedness and near syncope.  Patient states that he feels that he is going to pass out, but did not.  Denies unilateral numbness or tinglings in extremities, no facial droop or slurred speech.  Per report, patient was found to have hypotension with blood pressure 88/56.  He received 500 cc normal saline bolus by EMS.  His blood pressure is 131/84 when  I saw patient in ED. Patient has mild dry cough, denies shortness breath or chest pain.  No tenderness in the calf areas.  Patient does not have nausea, vomiting, diarrhea or abdominal pain.  He states that he has difficulty urinating and dysuria if he does not take his BPH medication Flomax.   Data reviewed independently and ED Course: pt was found to have WBC 12.6, lactic acid 1.7, troponin level was 40 yesterday --> 15 today.  UDS positive for cannabinoid.  Urinalysis yesterday (with cloudy appearance, WBC 6-10, negative leukocyte, negative bacteria), but UA is negative today. AKI with creatinine 1.6 5, BUN 42, GFR 46 (recent baseline creatinine 0.98 on 11/18/2021), potassium 3.2, magnesium 1.9.  Temperature normal, blood pressure 131/84 currently, heart rate 90-100, RR 29, 20, oxygen saturation 92-97% on room air.  Chest x-ray negative today.  Patient is placed on telemetry bed for observation"   After IV hydration, patient symptoms resolved. Orthostatic vitals were negative. Patient was ambulating independently without issues. AKI resolved with fluids.  Patient was advised to stay well-hydrated, monitor his BP at home and follow up closely with PCP. He was given hold instructions for his BP medication to avoid over treating and causing hypotension.    Medically stable for discharge home today.     Assessment and Plan:  Near syncope: CT head was negative day before admission.  No focal neurodeficit on physical examination.  Likely due to hypotension.  Etiology for hypotension is not completely clear, but  likely due to dehydration and taking usual antihypertensive medication.  PCP had just lowered his dose of lisinopril at recent visit about a week prior.   Blood pressure responded to IV fluid resuscitation.   Patient does not have chest pain or shortness of breath, no oxygen desaturation, low suspicions for PE. 11/4: asymptomatic today after volume resuscitation.   Hypotension: Possibly  due to dehydration. -Treated with IV fluid and BP improved -Resume lisinopril at discharge with hold parameters given for dizziness or lower BP -Close PCP follow up   SIRS (systemic inflammatory response syndrome) [facility]): Patient meets criteria for SIRS with WBC 12.6, tachycardia with heart rate of 102, RR 29.  Lactic acid is normal.   Urinalysis negative.  Chest x-ray negative. No evidence of infection.  Suspect due to hypotension.   AKI (acute kidney injury) ([place]): Likely due to dehydration.  ATN is also possible due to hypotension. Resolved with IV hydration. Monitor BMP at follow up.    Essential hypertension -Held lisinopril due to hypotension, resumed at d/c with hold parameters given -Close PCP follow up.   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 -Did counseling about importance of quitting alcohol use       Consultants: None Procedures performed: None  Disposition: Home Diet recommendation:  Discharge Diet Orders (From admission, onward)     Start     Ordered   11/28/21 0000  Diet - low sodium heart healthy        11/28/21 1147           Cardiac diet DISCHARGE MEDICATION: Allergies as of 11/28/2021   No Known Allergies      Medication List     STOP taking these medications    Breztri Aerosphere 160-9-4.8 MCG/ACT Aero Generic drug: Budeson-Glycopyrrol-Formoterol   cetirizine 10 MG tablet Commonly known as: ZYRTEC       TAKE these medications    albuterol (2.5 MG/3ML) 0.083%  nebulizer solution Commonly known as: PROVENTIL Take 3 mLs (2.5 mg total) by nebulization every 6 (six) hours as needed for wheezing or shortness of breath.   albuterol 108 (90 Base) MCG/ACT inhaler Commonly known as: VENTOLIN HFA Inhale 2 puffs into the lungs every 6 (six) hours as needed for wheezing or shortness of breath.   ALPRAZolam 1 MG tablet Commonly known as: XANAX Take 1 tablet (1 mg total) by mouth 2 (two) times daily as needed.   aspirin EC 81 MG tablet Take 1 tablet (81 mg total) by mouth daily. Swallow whole.   atorvastatin 80 MG tablet Commonly known as: LIPITOR Take 1 tablet (80 mg total) by mouth at bedtime.   gabapentin 400 MG capsule Commonly known as: NEURONTIN Take 400 mg by mouth in the morning and at bedtime. 400 mg twice a day as per patient --psychiatrist changed that Normally takes 400 mg daily   lisinopril 20 MG tablet Commonly known as: ZESTRIL Take 1 tablet (20 mg total) by mouth daily.   sildenafil 20 MG tablet Commonly known as: REVATIO Take 4-5 tablets (80-100 mg total) by mouth as needed (ED).   tamsulosin 0.4  MG Caps capsule Commonly known as: FLOMAX Take 1 capsule (0.4 mg total) by mouth in the morning and at bedtime.        Follow-up Information     Smitty Cords, DO. Call.   Specialty: Family Medicine Why: Please schedule follow up in 1-2 weeks Contact information: [address] [place] [address] [phone]                Discharge Exam: Filed Weights   11/28/21 1023  Weight: 76.2 kg   General exam: awake, alert, no acute distress HEENT: atraumatic, clear conjunctiva, anicteric sclera, moist mucus membranes, hearing grossly normal  Respiratory system: CTAB but diminished throughout, no wheezes, rales or rhonchi, normal respiratory effort at rest and with ambulation. Cardiovascular system: normal S1/S2, RRR, no JVD, murmurs, rubs, gallops, no pedal edema.   Gastrointestinal system: soft, NT, ND, no HSM  felt, +bowel sounds. Central nervous system: A&O x4. no gross focal neurologic deficits, normal speech Extremities: moves all, no edema, normal tone Skin: dry, intact, normal temperature, normal color, No rashes, lesions or ulcers Psychiatry: normal mood, congruent affect, judgement and insight appear normal   Condition at discharge: stable  The results of significant diagnostics from this hospitalization (including imaging, microbiology, ancillary and laboratory) are listed below for reference.   Imaging Studies: CT Angio Chest Pulmonary Embolism (PE) W or WO Contrast  Result Date: 11/29/2021 CLINICAL DATA:  Shortness of breath. EXAM: CT ANGIOGRAPHY CHEST WITH CONTRAST TECHNIQUE: Multidetector CT imaging of the chest was performed using the standard protocol during bolus administration of intravenous contrast. Multiplanar CT image reconstructions and MIPs were obtained to evaluate the vascular anatomy. RADIATION DOSE REDUCTION: This exam was performed according to the departmental dose-optimization program which includes automated exposure control, adjustment of the mA and/or kV according to patient size and/or use of iterative reconstruction technique. CONTRAST:  30mL OMNIPAQUE IOHEXOL 350 MG/ML SOLN COMPARISON:  06/22/2017 FINDINGS: Cardiovascular: The heart size is normal. No substantial pericardial effusion. Coronary artery calcification is evident. Mild atherosclerotic calcification is noted in the wall of the thoracic aorta. There is no filling defect within the opacified pulmonary arteries to suggest the presence of an acute pulmonary embolus. Mediastinum/Nodes: Normal to upper normal mediastinal and bilateral hilar lymph nodes evident. The esophagus has normal imaging features. There is no axillary lymphadenopathy. Lungs/Pleura: Diffuse bronchial wall thickening is basilar predominant with centrilobular ground-glass opacity in the upper lungs bilaterally. Areas of peripheral airway impaction  are noted in the dependent lower lobes bilaterally. No suspicious pulmonary nodule or mass. No focal airspace consolidation. No pleural effusion. Upper Abdomen: Unremarkable. Musculoskeletal: No worrisome lytic or sclerotic osseous abnormality. Advanced degenerative changes noted C7-T1 Review of the MIP images confirms the above findings. IMPRESSION: 1. No CT evidence for acute pulmonary embolus. 2. Diffuse bronchial wall thickening with upper lung predominant centrilobular ground-glass opacity and areas of peripheral Zajkowski airway impaction in the dependent lower lobes. Imaging features are compatible with infectious/inflammatory etiology. 3. Coronary artery calcification. 4.  Aortic Atherosclerosis (ICD10-I70.0). Electronically Signed   By: Kennith Center M.D.   On: 11/29/2021 08:48   DG Chest 2 View  Result Date: 11/29/2021 CLINICAL DATA:  Shortness of breath EXAM: CHEST - 2 VIEW COMPARISON:  Two days ago FINDINGS: Normal heart size and mediastinal contours. Chronic interstitial coarsening. No acute infiltrate or edema. No effusion or pneumothorax. No acute osseous findings. IMPRESSION: No active cardiopulmonary disease. Electronically Signed   By: Tiburcio Pea M.D.   On: 11/29/2021 03:44  DG Chest 2 View  Result Date: 11/27/2021 CLINICAL DATA:  Hypotension EXAM: CHEST - 2 VIEW COMPARISON:  Yesterday FINDINGS: Normal heart size and mediastinal contours. There is no edema, consolidation, effusion, or pneumothorax. Artifact from EKG leads. IMPRESSION: No active cardiopulmonary disease. Electronically Signed   By: Tiburcio Pea M.D.   On: 11/27/2021 09:03   DG Chest Portable 1 View  Result Date: 11/26/2021 CLINICAL DATA:  Altered mental status EXAM: PORTABLE CHEST 1 VIEW COMPARISON:  None Available. FINDINGS: The heart size and mediastinal contours are within normal limits. Both lungs are clear. The visualized skeletal structures are unremarkable. IMPRESSION: No active disease. Electronically Signed    By: Helyn Numbers M.D.   On: 11/26/2021 02:57   CT Head Wo Contrast  Result Date: 11/26/2021 CLINICAL DATA:  Altered mental status EXAM: CT HEAD WITHOUT CONTRAST TECHNIQUE: Contiguous axial images were obtained from the base of the skull through the vertex without intravenous contrast. RADIATION DOSE REDUCTION: This exam was performed according to the departmental dose-optimization program which includes automated exposure control, adjustment of the mA and/or kV according to patient size and/or use of iterative reconstruction technique. COMPARISON:  07/04/2020 FINDINGS: Brain: Large old right posterior temporal infarct with encephalomalacia, unchanged. No acute intracranial abnormality. Specifically, no hemorrhage, hydrocephalus, mass lesion, acute infarction, or significant intracranial injury. Vascular: No hyperdense vessel or unexpected calcification. Skull: No acute calvarial abnormality. Sinuses/Orbits: Mucosal thickening.  No acute findings. Other: None IMPRESSION: No acute intracranial abnormality. Old right MCA infarct Electronically Signed   By: Charlett Nose M.D.   On: 11/26/2021 00:28    Microbiology: Results for orders placed or performed during the hospital encounter of 11/27/21  Urine Culture     Status: None   Collection Time: 11/27/21  8:32 AM   Specimen: Urine, Clean Catch  Result Value Ref Range Status   Specimen Description   Final    URINE, CLEAN CATCH Performed at [facility], [address] [place], [place] [address]    Special Requests   Final    NONE Performed at [facility], [address] [place], [place] [address]    Culture   Final    NO GROWTH Performed at [facility], [address] [place], [place] [address]    Report Status 11/28/2021 FINAL  Final  Culture, blood (Routine X 2) w Reflex to ID Panel     Status: None (Preliminary result)   Collection Time: 11/27/21 10:47 AM   Specimen: BLOOD  Result Value Ref Range Status    Specimen Description BLOOD LEFT ANTECUBITAL  Final   Special Requests   Final    BOTTLES DRAWN AEROBIC AND ANAEROBIC Blood Culture results may not be optimal due to an excessive volume of blood received in culture bottles   Culture   Final    NO GROWTH 2 DAYS Performed at [facility], [address], [place], [place] [address]    Report Status PENDING  Incomplete  Culture, blood (Routine X 2) w Reflex to ID Panel     Status: None (Preliminary result)   Collection Time: 11/27/21 12:05 PM   Specimen: BLOOD  Result Value Ref Range Status   Specimen Description BLOOD LWRIST  Final   Special Requests   Final    BOTTLES DRAWN AEROBIC AND ANAEROBIC Blood Culture adequate volume   Culture   Final    NO GROWTH 2 DAYS Performed at [facility], [address], [place], [place] [address]  Report Status PENDING  Incomplete    Labs: CBC: Recent Labs  Lab 11/25/21 2342 11/27/21 0832 11/28/21 0607 11/29/21 0254  WBC 15.6* 12.6* 10.2 14.4*  NEUTROABS 11.7* 8.7*  --  11.0*  HGB 16.3 13.6 13.4 13.8  HCT 48.4 40.5 39.8 40.8  MCV 93.8 94.2 94.8 94.0  PLT 273 217 182 210   Basic Metabolic Panel: Recent Labs  Lab 11/25/21 2342 11/27/21 0832 11/28/21 0607 11/29/21 0254  NA 135 137 136 137  K 3.6 3.2* 3.8 3.6  CL 97* 102 106 102  CO2 24 25 24 26   GLUCOSE 127* 118* 92 106*  BUN 33* 42* 22 16  CREATININE 2.44* 1.65* 0.87 0.86  CALCIUM 9.7 8.6* 8.4* 8.7*  MG  --  1.9  --   --   PHOS  --  4.3  --   --    Liver Function Tests: Recent Labs  Lab 11/25/21 2342 11/27/21 0832 11/29/21 0254  AST 20 19 20   ALT 17 14 14   ALKPHOS 143* 121 119  BILITOT 0.9 0.7 0.9  PROT 7.4 6.7 6.9  ALBUMIN 4.4 3.9 3.7   CBG: Recent Labs  Lab 11/25/21 2336  GLUCAP 136*    Discharge time spent: greater than 30 minutes.  Signed: , DO [facility] 11/29/2021

## 2021-11-28 NOTE — Plan of Care (Signed)
  Problem: Education: Goal: Knowledge of General Education information will improve Description: Including pain rating scale, medication(s)/side effects and non-pharmacologic comfort measures 11/28/2021 1217 by Mancel Bale, RN Outcome: Adequate for Discharge 11/28/2021 0948 by Mancel Bale, RN Outcome: Progressing   Problem: Health Behavior/Discharge Planning: Goal: Ability to manage health-related needs will improve 11/28/2021 1217 by Mancel Bale, RN Outcome: Adequate for Discharge 11/28/2021 0948 by Mancel Bale, RN Outcome: Progressing   Problem: Clinical Measurements: Goal: Ability to maintain clinical measurements within normal limits will improve 11/28/2021 1217 by Mancel Bale, RN Outcome: Adequate for Discharge 11/28/2021 0948 by Mancel Bale, RN Outcome: Progressing Goal: Will remain free from infection 11/28/2021 1217 by Mancel Bale, RN Outcome: Adequate for Discharge 11/28/2021 0948 by Mancel Bale, RN Outcome: Progressing Goal: Diagnostic test results will improve 11/28/2021 1217 by Mancel Bale, RN Outcome: Adequate for Discharge 11/28/2021 0948 by Mancel Bale, RN Outcome: Progressing Goal: Respiratory complications will improve 11/28/2021 1217 by Mancel Bale, RN Outcome: Adequate for Discharge 11/28/2021 0948 by Mancel Bale, RN Outcome: Progressing Goal: Cardiovascular complication will be avoided 11/28/2021 1217 by Mancel Bale, RN Outcome: Adequate for Discharge 11/28/2021 0948 by Mancel Bale, RN Outcome: Progressing   Problem: Activity: Goal: Risk for activity intolerance will decrease 11/28/2021 1217 by Mancel Bale, RN Outcome: Adequate for Discharge 11/28/2021 0948 by Mancel Bale, RN Outcome: Progressing   Problem: Nutrition: Goal: Adequate nutrition will be maintained 11/28/2021 1217 by Mancel Bale, RN Outcome: Adequate for Discharge 11/28/2021 0948 by Mancel Bale, RN Outcome:  Progressing   Problem: Coping: Goal: Level of anxiety will decrease 11/28/2021 1217 by Mancel Bale, RN Outcome: Adequate for Discharge 11/28/2021 0948 by Mancel Bale, RN Outcome: Progressing   Problem: Elimination: Goal: Will not experience complications related to bowel motility 11/28/2021 1217 by Mancel Bale, RN Outcome: Adequate for Discharge 11/28/2021 0948 by Mancel Bale, RN Outcome: Progressing Goal: Will not experience complications related to urinary retention 11/28/2021 1217 by Mancel Bale, RN Outcome: Adequate for Discharge 11/28/2021 0948 by Mancel Bale, RN Outcome: Progressing   Problem: Pain Managment: Goal: General experience of comfort will improve 11/28/2021 1217 by Mancel Bale, RN Outcome: Adequate for Discharge 11/28/2021 0948 by Mancel Bale, RN Outcome: Progressing   Problem: Safety: Goal: Ability to remain free from injury will improve 11/28/2021 1217 by Mancel Bale, RN Outcome: Adequate for Discharge 11/28/2021 0948 by Mancel Bale, RN Outcome: Progressing   Problem: Skin Integrity: Goal: Risk for impaired skin integrity will decrease 11/28/2021 1217 by Mancel Bale, RN Outcome: Adequate for Discharge 11/28/2021 0948 by Mancel Bale, RN Outcome: Progressing

## 2021-11-28 NOTE — Evaluation (Signed)
Physical Therapy Evaluation Patient Details Name: Ryan [name] MRN: [id number] DOB: Jul 09, 1955 Today's Date: 11/28/2021  History of Present Illness  Admitted for near syncope. Negative orthostatics this admission. History includes HTN, HLD, COPD, and anxiety. Requested for PT eval by MD this date  Clinical Impression  Pt is a pleasant 66 year old male who was admitted for near syncope. Pt with negative orthostatics at this time and states he feels much improved. Pt demonstrates all bed mobility/transfers/ambulation at baseline level. Pt does not require any further PT needs at this time. Pt will be dc in house and does not require follow up. RN aware. Will dc current orders.      Recommendations for follow up therapy are one component of a multi-disciplinary discharge planning process, led by the attending physician.  Recommendations may be updated based on patient status, additional functional criteria and insurance authorization.  Follow Up Recommendations No PT follow up      Assistance Recommended at Discharge None  Patient can return home with the following       Equipment Recommendations None recommended by PT  Recommendations for Other Services       Functional Status Assessment Patient has not had a recent decline in their functional status     Precautions / Restrictions Precautions Precautions: Fall Restrictions Weight Bearing Restrictions: No      Mobility  Bed Mobility Overal bed mobility: Independent             General bed mobility comments: seated on EOB upon arrival. Per RN, has been indep ad lib in room'    Transfers Overall transfer level: Independent Equipment used: None               General transfer comment: ease of transition. No dizziness symptoms noted    Ambulation/Gait Ambulation/Gait assistance: Independent Gait Distance (Feet): 200 Feet Assistive device: None Gait Pattern/deviations: WFL(Within Functional Limits)        General Gait Details: ambulated around RN station on the way to look for his coffee. Able to start/stop as well as carry conversation throughout exertion.  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 Niece and grandson also available to assist    Prior Function Prior Level of Function : Independent/Modified Independent             Mobility Comments: no recent falls       Hand Dominance        Extremity/Trunk Assessment   Upper Extremity Assessment Upper Extremity Assessment: Overall WFL for tasks assessed    Lower Extremity Assessment Lower Extremity Assessment: Overall WFL for tasks assessed       Communication   Communication: No difficulties  Cognition Arousal/Alertness: Awake/alert Behavior During Therapy: WFL for tasks assessed/performed Overall Cognitive Status: Within Functional Limits for tasks assessed  General Comments      Exercises     Assessment/Plan    PT Assessment Patient does not need any further PT services  PT Problem List         PT Treatment Interventions      PT Goals (Current goals can be found in the Care Plan section)  Acute Rehab PT Goals Patient Stated Goal: to go home PT Goal Formulation: All assessment and education complete, DC therapy Time For Goal Achievement: 11/28/21 Potential to Achieve Goals: Good    Frequency       Co-evaluation               AM-PAC PT "6 Clicks" Mobility  Outcome Measure Help needed turning from your back to your  side while in a flat bed without using bedrails?: None Help needed moving from lying on your back to sitting on the side of a flat bed without using bedrails?: None Help needed moving to and from a bed to a chair (including a wheelchair)?: None Help needed standing up from a chair using your arms (e.g., wheelchair or bedside chair)?: None Help needed to walk in hospital room?: None Help needed climbing 3-5 steps with a railing? : None 6 Click Score: 24    End of Session   Activity Tolerance: Patient tolerated treatment well Patient left: in bed Nurse Communication: Mobility status PT Visit Diagnosis: Difficulty in walking, not elsewhere classified (R26.2)    Time: 1856-3149 PT Time Calculation (min) (ACUTE ONLY): 8 min   Charges:   PT Evaluation $PT Eval Low Complexity: 1 Low          Greggory Stallion, PT, DPT, [facility] [phone]   Ryan [name] 11/28/2021, 11:32 AM

## 2021-11-29 ENCOUNTER — Inpatient Hospital Stay: Payer: Medicare Other

## 2021-11-29 ENCOUNTER — Emergency Department: Payer: Medicare Other

## 2021-11-29 ENCOUNTER — Observation Stay
Admission: EM | Admit: 2021-11-29 | Discharge: 2021-11-30 | Disposition: A | Discharge status: Home / Self Care | Payer: Medicare Other | Attending: Internal Medicine | Admitting: Internal Medicine

## 2021-11-29 ENCOUNTER — Encounter: Payer: Self-pay | Admitting: Internal Medicine

## 2021-11-29 ENCOUNTER — Other Ambulatory Visit: Payer: Self-pay

## 2021-11-29 DIAGNOSIS — Z720 Tobacco use: Secondary | ICD-10-CM | POA: Diagnosis present

## 2021-11-29 DIAGNOSIS — J441 Chronic obstructive pulmonary disease with (acute) exacerbation: Principal | ICD-10-CM | POA: Diagnosis present

## 2021-11-29 DIAGNOSIS — R0902 Hypoxemia: Secondary | ICD-10-CM | POA: Insufficient documentation

## 2021-11-29 DIAGNOSIS — R0602 Shortness of breath: Secondary | ICD-10-CM | POA: Diagnosis not present

## 2021-11-29 DIAGNOSIS — A419 Sepsis, unspecified organism: Secondary | ICD-10-CM | POA: Diagnosis not present

## 2021-11-29 DIAGNOSIS — N189 Chronic kidney disease, unspecified: Secondary | ICD-10-CM | POA: Diagnosis not present

## 2021-11-29 DIAGNOSIS — Z7982 Long term (current) use of aspirin: Secondary | ICD-10-CM | POA: Diagnosis not present

## 2021-11-29 DIAGNOSIS — F1910 Other psychoactive substance abuse, uncomplicated: Secondary | ICD-10-CM | POA: Diagnosis present

## 2021-11-29 DIAGNOSIS — Z1152 Encounter for screening for COVID-19: Secondary | ICD-10-CM | POA: Diagnosis not present

## 2021-11-29 DIAGNOSIS — J45909 Unspecified asthma, uncomplicated: Secondary | ICD-10-CM | POA: Diagnosis not present

## 2021-11-29 DIAGNOSIS — N400 Enlarged prostate without lower urinary tract symptoms: Secondary | ICD-10-CM | POA: Diagnosis present

## 2021-11-29 DIAGNOSIS — F1090 Alcohol use, unspecified, uncomplicated: Secondary | ICD-10-CM | POA: Diagnosis present

## 2021-11-29 DIAGNOSIS — I129 Hypertensive chronic kidney disease with stage 1 through stage 4 chronic kidney disease, or unspecified chronic kidney disease: Secondary | ICD-10-CM | POA: Diagnosis not present

## 2021-11-29 DIAGNOSIS — R6520 Severe sepsis without septic shock: Secondary | ICD-10-CM | POA: Diagnosis not present

## 2021-11-29 DIAGNOSIS — Z743 Need for continuous supervision: Secondary | ICD-10-CM | POA: Diagnosis not present

## 2021-11-29 DIAGNOSIS — F17210 Nicotine dependence, cigarettes, uncomplicated: Secondary | ICD-10-CM | POA: Insufficient documentation

## 2021-11-29 DIAGNOSIS — Z8673 Personal history of transient ischemic attack (TIA), and cerebral infarction without residual deficits: Secondary | ICD-10-CM

## 2021-11-29 DIAGNOSIS — F419 Anxiety disorder, unspecified: Secondary | ICD-10-CM | POA: Diagnosis present

## 2021-11-29 DIAGNOSIS — I10 Essential (primary) hypertension: Secondary | ICD-10-CM | POA: Diagnosis not present

## 2021-11-29 DIAGNOSIS — Z789 Other specified health status: Secondary | ICD-10-CM | POA: Diagnosis present

## 2021-11-29 DIAGNOSIS — Z79899 Other long term (current) drug therapy: Secondary | ICD-10-CM | POA: Insufficient documentation

## 2021-11-29 DIAGNOSIS — I5A Non-ischemic myocardial injury (non-traumatic): Secondary | ICD-10-CM | POA: Diagnosis not present

## 2021-11-29 DIAGNOSIS — R6889 Other general symptoms and signs: Secondary | ICD-10-CM | POA: Diagnosis not present

## 2021-11-29 DIAGNOSIS — E785 Hyperlipidemia, unspecified: Secondary | ICD-10-CM | POA: Diagnosis present

## 2021-11-29 LAB — LIPASE, BLOOD: Lipase: 36 U/L (ref 11–51)

## 2021-11-29 LAB — TROPONIN I (HIGH SENSITIVITY)
Troponin I (High Sensitivity): 36 ng/L — ABNORMAL HIGH (ref <18)
Troponin I (High Sensitivity): 33 ng/L — ABNORMAL HIGH (ref <18)

## 2021-11-29 LAB — LACTIC ACID, PLASMA
Lactic Acid, Venous: 1.7 mmol/L (ref 0.5–1.9)
Lactic Acid, Venous: 2.2 mmol/L (ref 0.5–1.9)
Lactic Acid, Venous: 2.3 mmol/L (ref 0.5–1.9)
Lactic Acid, Venous: 2.7 mmol/L (ref 0.5–1.9)

## 2021-11-29 LAB — COMPREHENSIVE METABOLIC PANEL
ALT: 14 U/L (ref 0–44)
AST: 20 U/L (ref 15–41)
Albumin: 3.7 g/dL (ref 3.5–5.0)
Alkaline Phosphatase: 119 U/L (ref 38–126)
Anion gap: 9 (ref 5–15)
BUN: 16 mg/dL (ref 8–23)
CO2: 26 mmol/L (ref 22–32)
Calcium: 8.7 mg/dL — ABNORMAL LOW (ref 8.9–10.3)
Chloride: 102 mmol/L (ref 98–111)
Creatinine, Ser: 0.86 mg/dL (ref 0.61–1.24)
GFR, Estimated: >60 mL/min (ref >60)
Glucose, Bld: 106 mg/dL — ABNORMAL HIGH (ref 70–99)
Potassium: 3.6 mmol/L (ref 3.5–5.1)
Sodium: 137 mmol/L (ref 135–145)
Total Bilirubin: 0.9 mg/dL (ref 0.3–1.2)
Total Protein: 6.9 g/dL (ref 6.5–8.1)

## 2021-11-29 LAB — CBC WITH DIFFERENTIAL/PLATELET
Abs Immature Granulocytes: 0.07 10*3/uL (ref 0.00–0.07)
Basophils Absolute: 0.1 10*3/uL (ref 0.0–0.1)
Basophils Relative: 0 %
Eosinophils Absolute: 0.2 10*3/uL (ref 0.0–0.5)
Eosinophils Relative: 1 %
HCT: 40.8 % (ref 39.0–52.0)
Hemoglobin: 13.8 g/dL (ref 13.0–17.0)
Immature Granulocytes: 1 %
Lymphocytes Relative: 15 %
Lymphs Abs: 2.2 10*3/uL (ref 0.7–4.0)
MCH: 31.8 pg (ref 26.0–34.0)
MCHC: 33.8 g/dL (ref 30.0–36.0)
MCV: 94 fL (ref 80.0–100.0)
Monocytes Absolute: 1 10*3/uL (ref 0.1–1.0)
Monocytes Relative: 7 %
Neutro Abs: 11 10*3/uL — ABNORMAL HIGH (ref 1.7–7.7)
Neutrophils Relative %: 76 %
Platelets: 210 10*3/uL (ref 150–400)
RBC: 4.34 MIL/uL (ref 4.22–5.81)
RDW: 12.1 % (ref 11.5–15.5)
WBC: 14.4 10*3/uL — ABNORMAL HIGH (ref 4.0–10.5)
nRBC: 0 % (ref 0.0–0.2)

## 2021-11-29 LAB — RESP PANEL BY RT-PCR (FLU A&B, COVID) ARPGX2
Influenza A by PCR: NEGATIVE
Influenza B by PCR: NEGATIVE
SARS Coronavirus 2 by RT PCR: NEGATIVE

## 2021-11-29 MED ORDER — METHYLPREDNISOLONE SODIUM SUCC 125 MG IJ SOLR
125.0000 mg | Freq: Once | INTRAMUSCULAR | Status: AC
  Administered 2021-11-29: 125 mg via INTRAVENOUS
  Filled 2021-11-29: qty 2

## 2021-11-29 MED ORDER — LORAZEPAM 2 MG/ML IJ SOLN
0.5000 mg | Freq: Four times a day (QID) | INTRAMUSCULAR | Status: DC | PRN
  Administered 2021-11-29: 0.5 mg via INTRAVENOUS
  Filled 2021-11-29: qty 1

## 2021-11-29 MED ORDER — ALBUTEROL SULFATE (2.5 MG/3ML) 0.083% IN NEBU: 2.5000 mg | INHALATION_SOLUTION | RESPIRATORY_TRACT | Status: DC | PRN

## 2021-11-29 MED ORDER — MAGNESIUM HYDROXIDE 400 MG/5ML PO SUSP: 30.0000 mL | Freq: Every day | ORAL | Status: DC | PRN

## 2021-11-29 MED ORDER — IPRATROPIUM-ALBUTEROL 0.5-2.5 (3) MG/3ML IN SOLN
3.0000 mL | Freq: Three times a day (TID) | RESPIRATORY_TRACT | Status: DC
  Administered 2021-11-30: 3 mL via RESPIRATORY_TRACT
  Filled 2021-11-29: qty 3

## 2021-11-29 MED ORDER — IPRATROPIUM-ALBUTEROL 0.5-2.5 (3) MG/3ML IN SOLN
3.0000 mL | Freq: Four times a day (QID) | RESPIRATORY_TRACT | Status: DC
  Administered 2021-11-29: 3 mL via RESPIRATORY_TRACT
  Filled 2021-11-29: qty 3

## 2021-11-29 MED ORDER — NICOTINE 21 MG/24HR TD PT24
21.0000 mg | MEDICATED_PATCH | Freq: Every day | TRANSDERMAL | Status: DC
  Administered 2021-11-29 – 2021-11-30 (×2): 21 mg via TRANSDERMAL
  Filled 2021-11-29 (×2): qty 1

## 2021-11-29 MED ORDER — IPRATROPIUM-ALBUTEROL 0.5-2.5 (3) MG/3ML IN SOLN
3.0000 mL | Freq: Once | RESPIRATORY_TRACT | Status: AC
  Administered 2021-11-29: 3 mL via RESPIRATORY_TRACT
  Filled 2021-11-29: qty 3

## 2021-11-29 MED ORDER — IPRATROPIUM-ALBUTEROL 0.5-2.5 (3) MG/3ML IN SOLN
3.0000 mL | RESPIRATORY_TRACT | Status: DC
  Administered 2021-11-29 (×2): 3 mL via RESPIRATORY_TRACT

## 2021-11-29 MED ORDER — SODIUM CHLORIDE 0.9 % IV SOLN: INTRAVENOUS | Status: DC

## 2021-11-29 MED ORDER — TAMSULOSIN HCL 0.4 MG PO CAPS
0.4000 mg | ORAL_CAPSULE | Freq: Every day | ORAL | Status: DC
  Administered 2021-11-29 – 2021-11-30 (×2): 0.4 mg via ORAL
  Filled 2021-11-29 (×2): qty 1

## 2021-11-29 MED ORDER — ACETAMINOPHEN 650 MG RE SUPP: 650.0000 mg | Freq: Four times a day (QID) | RECTAL | Status: DC | PRN

## 2021-11-29 MED ORDER — ENOXAPARIN SODIUM 40 MG/0.4ML IJ SOSY: 40.0000 mg | PREFILLED_SYRINGE | INTRAMUSCULAR | Status: DC

## 2021-11-29 MED ORDER — ACETAMINOPHEN 325 MG PO TABS
650.0000 mg | ORAL_TABLET | Freq: Four times a day (QID) | ORAL | Status: DC | PRN
  Administered 2021-11-29 – 2021-11-30 (×2): 650 mg via ORAL
  Filled 2021-11-29 (×2): qty 2

## 2021-11-29 MED ORDER — ASPIRIN 81 MG PO TBEC
81.0000 mg | DELAYED_RELEASE_TABLET | Freq: Every day | ORAL | Status: DC
  Administered 2021-11-29 – 2021-11-30 (×2): 81 mg via ORAL
  Filled 2021-11-29 (×2): qty 1

## 2021-11-29 MED ORDER — AZITHROMYCIN 250 MG PO TABS
500.0000 mg | ORAL_TABLET | Freq: Every day | ORAL | Status: AC
  Administered 2021-11-29: 500 mg via ORAL
  Filled 2021-11-29: qty 2

## 2021-11-29 MED ORDER — ATORVASTATIN CALCIUM 20 MG PO TABS
80.0000 mg | ORAL_TABLET | Freq: Every day | ORAL | Status: DC
  Administered 2021-11-29: 80 mg via ORAL
  Filled 2021-11-29: qty 4

## 2021-11-29 MED ORDER — TRAZODONE HCL 50 MG PO TABS
25.0000 mg | ORAL_TABLET | Freq: Every evening | ORAL | Status: DC | PRN
  Administered 2021-11-29: 25 mg via ORAL
  Filled 2021-11-29: qty 1

## 2021-11-29 MED ORDER — AZITHROMYCIN 250 MG PO TABS
250.0000 mg | ORAL_TABLET | Freq: Every day | ORAL | Status: DC
  Administered 2021-11-30: 250 mg via ORAL
  Filled 2021-11-29: qty 1

## 2021-11-29 MED ORDER — GABAPENTIN 400 MG PO CAPS
400.0000 mg | ORAL_CAPSULE | Freq: Every day | ORAL | Status: DC
  Administered 2021-11-29: 400 mg via ORAL
  Filled 2021-11-29: qty 1

## 2021-11-29 MED ORDER — DM-GUAIFENESIN ER 30-600 MG PO TB12
1.0000 | ORAL_TABLET | Freq: Two times a day (BID) | ORAL | Status: DC | PRN
  Administered 2021-11-29: 1 via ORAL
  Filled 2021-11-29: qty 1

## 2021-11-29 MED ORDER — METHYLPREDNISOLONE SODIUM SUCC 125 MG IJ SOLR
125.0000 mg | Freq: Two times a day (BID) | INTRAMUSCULAR | Status: DC
  Administered 2021-11-29 – 2021-11-30 (×2): 125 mg via INTRAVENOUS
  Filled 2021-11-29 (×2): qty 2

## 2021-11-29 MED ORDER — LACTATED RINGERS IV BOLUS
500.0000 mL | Freq: Once | INTRAVENOUS | Status: AC
  Administered 2021-11-29: 500 mL via INTRAVENOUS

## 2021-11-29 MED ORDER — PROMETHAZINE HCL 25 MG PO TABS: 12.5000 mg | ORAL_TABLET | Freq: Four times a day (QID) | ORAL | Status: DC | PRN

## 2021-11-29 MED ORDER — IOHEXOL 350 MG/ML SOLN
75.0000 mL | Freq: Once | INTRAVENOUS | Status: AC | PRN
  Administered 2021-11-29: 75 mL via INTRAVENOUS

## 2021-11-29 MED ORDER — SODIUM CHLORIDE 0.9 % IV SOLN
1.0000 g | INTRAVENOUS | Status: DC
  Administered 2021-11-29: 1 g via INTRAVENOUS
  Filled 2021-11-29: qty 10

## 2021-11-29 MED ORDER — SODIUM CHLORIDE 0.9 % IV BOLUS
1000.0000 mL | Freq: Once | INTRAVENOUS | Status: AC
  Administered 2021-11-29: 1000 mL via INTRAVENOUS

## 2021-11-29 MED ORDER — LORAZEPAM 2 MG/ML IJ SOLN
0.5000 mg | Freq: Once | INTRAMUSCULAR | Status: AC
  Administered 2021-11-29: 0.5 mg via INTRAVENOUS
  Filled 2021-11-29: qty 1

## 2021-11-29 MED ORDER — HYDRALAZINE HCL 20 MG/ML IJ SOLN
10.0000 mg | Freq: Once | INTRAMUSCULAR | Status: AC
  Administered 2021-11-29: 10 mg via INTRAVENOUS
  Filled 2021-11-29: qty 1

## 2021-11-29 MED ORDER — LISINOPRIL 20 MG PO TABS
20.0000 mg | ORAL_TABLET | Freq: Every day | ORAL | Status: DC
  Administered 2021-11-29 – 2021-11-30 (×2): 20 mg via ORAL
  Filled 2021-11-29 (×2): qty 1

## 2021-11-29 NOTE — ED Notes (Signed)
ED TO INPATIENT HANDOFF REPORT  ED Nurse Name and Phone #: Baxter Flattery, RN  S Name/Age/Gender Ryan [name] 66 y.o. male Room/Bed: ED16A/ED16A  Code Status   Code Status: Full Code  Home/SNF/Other Home Patient oriented to: self, place, time, and situation Is this baseline? Yes   Triage Complete: Triage complete  Chief Complaint COPD exacerbation ([place]) [J44.1]  Triage Note No notes on file   Allergies No Known Allergies  Level of Care/Admitting Diagnosis ED Disposition     ED Disposition  Admit   Condition  --   [place]: [place] [[id number]]  Level of Care: Telemetry Medical [104]  Covid Evaluation: Asymptomatic - no recent exposure (last 10 days) testing not required  Diagnosis: COPD exacerbation [facility]) [[id number]]  Admitting Physician: Christel Mormon [[id number]]  Attending Physician: Christel Mormon [[id number]]  Certification:: I certify this patient will need inpatient services for at least 2 midnights  Estimated Length of Stay: 2          B Medical/Surgery History Past Medical History:  Diagnosis Date   Anxiety    Arthritis    left hand   Asthma    Chronic kidney disease    HAS HAD KIDNEY STONE 2015-- JUST PASSED   COPD (chronic obstructive pulmonary disease) ([place])    Dyspnea    GERD (gastroesophageal reflux disease)    TAKES TUMS FOR RELIEF   Hyperlipidemia    Hypertension    Improved, No longer on meds   Lower urinary tract symptoms (LUTS)    Prostate disease    Stroke [facility])    Thyroid disease    Wears dentures    hass full upper plate, not wearing, broken   Wears hearing aid    Past Surgical History:  Procedure Laterality Date   BACK SURGERY     CATARACT EXTRACTION W/PHACO Left 01/06/2016   Procedure: CATARACT EXTRACTION PHACO AND INTRAOCULAR LENS PLACEMENT ([place]);  Surgeon: Eulogio Bear, MD;  Location: [place];  Service: Ophthalmology;  Laterality: Left;  LEFT   COLONOSCOPY WITH PROPOFOL N/A  04/19/2016   Procedure: COLONOSCOPY WITH PROPOFOL;  Surgeon: Manya Silvas, MD;  Location: [facility] ENDOSCOPY;  Service: Endoscopy;  Laterality: N/A;   ESOPHAGOGASTRODUODENOSCOPY (EGD) WITH PROPOFOL N/A 10/24/2015   Procedure: ESOPHAGOGASTRODUODENOSCOPY (EGD) WITH PROPOFOL;  Surgeon: Manya Silvas, MD;  Location: [facility] ENDOSCOPY;  Service: Endoscopy;  Laterality: N/A;   EYE SURGERY     HERNIA REPAIR     ERRONEOUS UMBILICAL HERNIA   5366   MYRINGOTOMY WITH TUBE PLACEMENT Bilateral 03/28/2015   Procedure: MYRINGOTOMY WITH TUBE PLACEMENT;  Surgeon: Beverly Gust, MD;  Location: [place];  Service: ENT;  Laterality: Bilateral;  BUTTERFLY TUBE   SINUSOTOMY  02/28/2019   TONSILLECTOMY     TRANSFORAMINAL LUMBAR INTERBODY FUSION (TLIF) WITH PEDICLE SCREW FIXATION 1 LEVEL Right 06/18/2014   Procedure: TRANSFORAMINAL LUMBAR INTERBODY FUSION (TLIF) WITH PEDICLE SCREW FIXATION 1 LEVEL LUMBAR 5 -SACRAL 1;  Surgeon: Karie Chimera, MD;  Location: [place] NEURO ORS;  Service: Neurosurgery;  Laterality: Right;  Right transforaminal lumbar interbody fusion with interbody prosthesis and percutaneous pedicle screws Lumbar 5 to Sacral 1     A IV Location/Drains/Wounds Patient Lines/Drains/Airways Status     Active Line/Drains/Airways     Name Placement date Placement time Site Days   Peripheral IV 11/29/21 20 G Anterior;Distal;Left;Upper Arm 11/29/21  0300  Arm  less than 1   Myringotomy Tube 03/28/15  --  Bilateral Ears  2438   Incision (Closed) 01/06/16 Eye Left 01/06/16  0915  -- 2154            Intake/Output Last 24 hours No intake or output data in the 24 hours ending 11/29/21 0752  Labs/Imaging Results for orders placed or performed during the hospital encounter of 11/29/21 (from the past 48 hour(s))  CBC with Differential     Status: Abnormal   Collection Time: 11/29/21  2:54 AM  Result Value Ref Range   WBC 14.4 (H) 4.0 - 10.5 K/uL   RBC 4.34 4.22 - 5.81 MIL/uL   Hemoglobin 13.8 13.0  - 17.0 g/dL   HCT 16.9 67.8 - 93.8 %   MCV 94.0 80.0 - 100.0 fL   MCH 31.8 26.0 - 34.0 pg   MCHC 33.8 30.0 - 36.0 g/dL   RDW 10.1 75.1 - 02.5 %   Platelets 210 150 - 400 K/uL   nRBC 0.0 0.0 - 0.2 %   Neutrophils Relative % 76 %   Neutro Abs 11.0 (H) 1.7 - 7.7 K/uL   Lymphocytes Relative 15 %   Lymphs Abs 2.2 0.7 - 4.0 K/uL   Monocytes Relative 7 %   Monocytes Absolute 1.0 0.1 - 1.0 K/uL   Eosinophils Relative 1 %   Eosinophils Absolute 0.2 0.0 - 0.5 K/uL   Basophils Relative 0 %   Basophils Absolute 0.1 0.0 - 0.1 K/uL   Immature Granulocytes 1 %   Abs Immature Granulocytes 0.07 0.00 - 0.07 K/uL    Comment: Performed at [facility], [address] [place], [place] [address]  Comprehensive metabolic panel     Status: Abnormal   Collection Time: 11/29/21  2:54 AM  Result Value Ref Range   Sodium 137 135 - 145 mmol/L   Potassium 3.6 3.5 - 5.1 mmol/L   Chloride 102 98 - 111 mmol/L   CO2 26 22 - 32 mmol/L   Glucose, Bld 106 (H) 70 - 99 mg/dL    Comment: Glucose reference range applies only to samples taken after fasting for at least 8 hours.   BUN 16 8 - 23 mg/dL   Creatinine, Ser 8.24 0.61 - 1.24 mg/dL   Calcium 8.7 (L) 8.9 - 10.3 mg/dL   Total Protein 6.9 6.5 - 8.1 g/dL   Albumin 3.7 3.5 - 5.0 g/dL   AST 20 15 - 41 U/L   ALT 14 0 - 44 U/L   Alkaline Phosphatase 119 38 - 126 U/L   Total Bilirubin 0.9 0.3 - 1.2 mg/dL   GFR, Estimated >23 >53 mL/min    Comment: (NOTE) Calculated using the CKD-EPI Creatinine Equation (2021)    Anion gap 9 5 - 15    Comment: Performed at [facility], [address] [place], [place] [address]  Lipase, blood     Status: None   Collection Time: 11/29/21  2:54 AM  Result Value Ref Range   Lipase 36 11 - 51 U/L    Comment: Performed at [facility], [address] [place], [place] [address]  Troponin I (High Sensitivity)     Status: Abnormal   Collection Time: 11/29/21  2:54 AM  Result Value Ref Range    Troponin I (High Sensitivity) 36 (H) <18 ng/L    Comment: READ BACK AND VERIFIED WITH RACHEL MERKLE AT 8676 11/29/2021 DLB (NOTE) Elevated high sensitivity troponin I (hsTnI) values and significant  changes across serial measurements may suggest ACS but many other  chronic and acute  conditions are known to elevate hsTnI results.  Refer to the "Links" section for chest pain algorithms and additional  guidance. Performed at [facility], [address] [place], [place] [address]   Resp Panel by RT-PCR (Flu A&B, Covid) Anterior Nasal Swab     Status: None   Collection Time: 11/29/21  2:55 AM   Specimen: Anterior Nasal Swab  Result Value Ref Range   SARS Coronavirus 2 by RT PCR NEGATIVE NEGATIVE    Comment: (NOTE) SARS-CoV-2 target nucleic acids are NOT DETECTED.  The SARS-CoV-2 RNA is generally detectable in upper respiratory specimens during the acute phase of infection. The lowest concentration of SARS-CoV-2 viral copies this assay can detect is 138 copies/mL. A negative result does not preclude SARS-Cov-2 infection and should not be used as the sole basis for treatment or other patient management decisions. A negative result may occur with  improper specimen collection/handling, submission of specimen other than nasopharyngeal swab, presence of viral mutation(s) within the areas targeted by this assay, and inadequate number of viral copies(<138 copies/mL). A negative result must be combined with clinical observations, patient history, and epidemiological information. The expected result is Negative.  Fact Sheet for Patients:  BloggerCourse.com  Fact Sheet for Healthcare Providers:  SeriousBroker.it  This test is no t yet approved or cleared by the [place] FDA and  has been authorized for detection and/or diagnosis of SARS-CoV-2 by FDA under an Emergency Use Authorization (EUA). This EUA will remain  in effect  (meaning this test can be used) for the duration of the COVID-19 declaration under Section 564(b)(1) of the Act, 21 U.S.C.section 360bbb-3(b)(1), unless the authorization is terminated  or revoked sooner.       Influenza A by PCR NEGATIVE NEGATIVE   Influenza B by PCR NEGATIVE NEGATIVE    Comment: (NOTE) The Xpert Xpress SARS-CoV-2/FLU/RSV plus assay is intended as an aid in the diagnosis of influenza from Nasopharyngeal swab specimens and should not be used as a sole basis for treatment. Nasal washings and aspirates are unacceptable for Xpert Xpress SARS-CoV-2/FLU/RSV testing.  Fact Sheet for Patients: BloggerCourse.com  Fact Sheet for Healthcare Providers: SeriousBroker.it  This test is not yet approved or cleared by the [place] FDA and has been authorized for detection and/or diagnosis of SARS-CoV-2 by FDA under an Emergency Use Authorization (EUA). This EUA will remain in effect (meaning this test can be used) for the duration of the COVID-19 declaration under Section 564(b)(1) of the Act, 21 U.S.C. section 360bbb-3(b)(1), unless the authorization is terminated or revoked.  Performed at [facility], [address] [place], [place] [address]   Troponin I (High Sensitivity)     Status: Abnormal   Collection Time: 11/29/21  4:47 AM  Result Value Ref Range   Troponin I (High Sensitivity) 33 (H) <18 ng/L    Comment: (NOTE) Elevated high sensitivity troponin I (hsTnI) values and significant  changes across serial measurements may suggest ACS but many other  chronic and acute conditions are known to elevate hsTnI results.  Refer to the "Links" section for chest pain algorithms and additional  guidance. Performed at [facility], [address] [place], [place] [address]    DG Chest 2 View  Result Date: 11/29/2021 CLINICAL DATA:  Shortness of breath EXAM: CHEST - 2 VIEW COMPARISON:  Two days ago  FINDINGS: Normal heart size and mediastinal contours. Chronic interstitial coarsening. No acute infiltrate or edema. No effusion or pneumothorax. No acute osseous findings. IMPRESSION: No  active cardiopulmonary disease. Electronically Signed   By: Tiburcio PeaJonathan  Watts M.D.   On: 11/29/2021 03:44   DG Chest 2 View  Result Date: 11/27/2021 CLINICAL DATA:  Hypotension EXAM: CHEST - 2 VIEW COMPARISON:  Yesterday FINDINGS: Normal heart size and mediastinal contours. There is no edema, consolidation, effusion, or pneumothorax. Artifact from EKG leads. IMPRESSION: No active cardiopulmonary disease. Electronically Signed   By: Tiburcio PeaJonathan  Watts M.D.   On: 11/27/2021 09:03    Pending Labs Unresulted Labs (From admission, onward)     Start     Ordered   11/30/21 0500  Basic metabolic panel  Tomorrow morning,   R        11/29/21 0626   11/30/21 0500  CBC  Tomorrow morning,   R        11/29/21 0626   11/29/21 0746  Lactic acid, plasma  STAT Now then every 2 hours,   R      11/29/21 0745   11/29/21 0744  Expectorated Sputum Assessment w Gram Stain, Rflx to Resp Cult  Once,   R        11/29/21 0743            Vitals/Pain Today's Vitals   11/29/21 0500 11/29/21 0600 11/29/21 0608 11/29/21 0630  BP: (!) 174/83 (!) 171/89  (!) 163/87  Pulse: 85 81  86  Resp:  18  18  Temp:   98.7 F (37.1 C)   TempSrc:   Oral   SpO2: 90% 94%  94%  Weight:      PainSc:        Isolation Precautions No active isolations  Medications Medications  0.9 %  sodium chloride infusion ( Intravenous New Bag/Given 11/29/21 0651)  acetaminophen (TYLENOL) tablet 650 mg (has no administration in time range)    Or  acetaminophen (TYLENOL) suppository 650 mg (has no administration in time range)  traZODone (DESYREL) tablet 25 mg (has no administration in time range)  magnesium hydroxide (MILK OF MAGNESIA) suspension 30 mL (has no administration in time range)  promethazine (PHENERGAN) tablet 12.5 mg (has no administration in  time range)  azithromycin (ZITHROMAX) tablet 500 mg (has no administration in time range)    Followed by  azithromycin (ZITHROMAX) tablet 250 mg (has no administration in time range)  LORazepam (ATIVAN) injection 0.5 mg (has no administration in time range)  ipratropium-albuterol (DUONEB) 0.5-2.5 (3) MG/3ML nebulizer solution 3 mL (has no administration in time range)  albuterol (PROVENTIL) (2.5 MG/3ML) 0.083% nebulizer solution 2.5 mg (has no administration in time range)  dextromethorphan-guaiFENesin (MUCINEX DM) 30-600 MG per 12 hr tablet 1 tablet (has no administration in time range)  methylPREDNISolone sodium succinate (SOLU-MEDROL) 125 mg/2 mL injection 125 mg (has no administration in time range)  nicotine (NICODERM CQ - dosed in mg/24 hours) patch 21 mg (has no administration in time range)  sodium chloride 0.9 % bolus 1,000 mL (0 mLs Intravenous Stopped 11/29/21 0351)  methylPREDNISolone sodium succinate (SOLU-MEDROL) 125 mg/2 mL injection 125 mg (125 mg Intravenous Given 11/29/21 0305)  ipratropium-albuterol (DUONEB) 0.5-2.5 (3) MG/3ML nebulizer solution 3 mL (3 mLs Nebulization Given 11/29/21 0304)  hydrALAZINE (APRESOLINE) injection 10 mg (10 mg Intravenous Given 11/29/21 0407)  ipratropium-albuterol (DUONEB) 0.5-2.5 (3) MG/3ML nebulizer solution 3 mL (3 mLs Nebulization Given 11/29/21 0559)  LORazepam (ATIVAN) injection 0.5 mg (0.5 mg Intravenous Given 11/29/21 0559)    Mobility walks Low fall risk   Focused Assessments Pulmonary Assessment Handoff:  Lung sounds:   O2 Device:  Nasal Cannula O2 Flow Rate (L/min): 2 L/min    R Recommendations: See Admitting Provider Note  Report given to:   Additional Notes:

## 2021-11-29 NOTE — ED Provider Notes (Signed)
[facility] Provider Note    Event Date/Time   First MD Initiated Contact with Patient 11/29/21 0250     (approximate)   History   Shortness of Breath (Patient C/O SOB that began tonight. Used inhaler after EMS picked him up)   HPI  Ryan [name] is a 66 y.o. male brought to the ED via EMS from home with a chief complaint of shortness of breath.  Patient with a history of COPD not on home oxygen, hypertension, CKD, CVA who was recently hospitalized 11/3 - 11/28/2021 for syncope secondary to dehydration.  This was following an ED visit on 11/25/2021 where he was found to have AKI.  Incidentally, patient was noted to be positive for cocaine and cannabinoids on 11/27/2021.  Called EMS for difficulty breathing.  Used his inhalers after EMS arrival.  Did not receive additional medications per EMS.  Endorses dry cough.  Denies fever, chest pain, abdominal pain, nausea, vomiting or dizziness.     Past Medical History   Past Medical History:  Diagnosis Date   Anxiety    Arthritis    left hand   Asthma    Chronic kidney disease    HAS HAD KIDNEY STONE 2015-- JUST PASSED   COPD (chronic obstructive pulmonary disease) (HCC)    Dyspnea    GERD (gastroesophageal reflux disease)    TAKES TUMS FOR RELIEF   Hyperlipidemia    Hypertension    Improved, No longer on meds   Lower urinary tract symptoms (LUTS)    Prostate disease    Stroke [facility])    Thyroid disease    Wears dentures    hass full upper plate, not wearing, broken   Wears hearing aid      Active Problem List   Patient Active Problem List   Diagnosis Date Noted   Hypotension 11/27/2021   COPD (chronic obstructive pulmonary disease) ([place]) 11/27/2021   AKI (acute kidney injury) ([place]) 11/27/2021   Anxiety 11/27/2021   Tobacco abuse 11/27/2021   Alcohol use 11/27/2021   Hypokalemia 11/27/2021   SIRS (systemic inflammatory response syndrome) ([place]) 11/27/2021   Near syncope 11/27/2021   BPH (benign  prostatic hyperplasia) 11/27/2021   Alcohol-induced mood disorder ([place]) 08/02/2020   History of CVA (cerebrovascular accident) without residual deficits 10/23/2019   Pre-diabetes 10/23/2019   HLD (hyperlipidemia) 10/23/2019   Chronic pain syndrome 08/15/2019   Lumbar spondylosis 03/07/2018   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   History of lumbar fusion 03/07/2018   DDD (degenerative disc disease), lumbosacral 06/02/2014   Spinal stenosis, lumbar region, with neurogenic claudication 06/02/2014   Lumbar radiculopathy 06/02/2014   Centrilobular emphysema ([place]) 04/19/2014   Chronic LBP 04/19/2014   Essential hypertension 04/19/2014   Compulsive tobacco user syndrome 05/22/2013   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     Past Surgical History   Past Surgical History:  Procedure Laterality Date   BACK SURGERY     CATARACT EXTRACTION W/PHACO Left 01/06/2016   Procedure: CATARACT EXTRACTION PHACO AND INTRAOCULAR LENS PLACEMENT (IOC);  Surgeon: Eulogio Bear, MD;  Location: [place];  Service: Ophthalmology;  Laterality: Left;  LEFT   COLONOSCOPY WITH PROPOFOL N/A 04/19/2016   Procedure: COLONOSCOPY WITH PROPOFOL;  Surgeon: Manya Silvas, MD;  Location: [facility] ENDOSCOPY;  Service: Endoscopy;  Laterality: N/A;   ESOPHAGOGASTRODUODENOSCOPY (EGD) WITH PROPOFOL N/A 10/24/2015   Procedure: ESOPHAGOGASTRODUODENOSCOPY (EGD) WITH PROPOFOL;  Surgeon: Manya Silvas, MD;  Location: [facility] ENDOSCOPY;  Service: Endoscopy;  Laterality: N/A;   EYE SURGERY     HERNIA REPAIR     ERRONEOUS UMBILICAL HERNIA   [id number]   MYRINGOTOMY WITH TUBE PLACEMENT Bilateral 03/28/2015   Procedure: MYRINGOTOMY WITH TUBE PLACEMENT;  Surgeon: Beverly Gust, MD;  Location: [place];  Service: ENT;  Laterality: Bilateral;  BUTTERFLY TUBE   SINUSOTOMY  02/28/2019   TONSILLECTOMY     TRANSFORAMINAL LUMBAR INTERBODY FUSION (TLIF) WITH PEDICLE SCREW FIXATION 1 LEVEL Right 06/18/2014    Procedure: TRANSFORAMINAL LUMBAR INTERBODY FUSION (TLIF) WITH PEDICLE SCREW FIXATION 1 LEVEL LUMBAR 5 -SACRAL 1;  Surgeon: Karie Chimera, MD;  Location: [place] NEURO ORS;  Service: Neurosurgery;  Laterality: Right;  Right transforaminal lumbar interbody fusion with interbody prosthesis and percutaneous pedicle screws Lumbar 5 to Sacral 1     Home Medications   Prior to Admission medications   Medication Sig Start Date End Date Taking? Authorizing Provider  albuterol (PROVENTIL) (2.5 MG/3ML) 0.083% nebulizer solution Take 3 mLs (2.5 mg total) by nebulization every 6 (six) hours as needed for wheezing or shortness of breath. 11/18/21   Karamalegos, Devonne Doughty, DO  albuterol (VENTOLIN HFA) 108 (90 Base) MCG/ACT inhaler Inhale 2 puffs into the lungs every 6 (six) hours as needed for wheezing or shortness of breath. 11/18/21   Karamalegos, Devonne Doughty, DO  ALPRAZolam Duanne Moron) 1 MG tablet Take 1 tablet (1 mg total) by mouth 2 (two) times daily as needed. 11/28/21   Ezekiel Slocumb, DO  aspirin EC 81 MG EC tablet Take 1 tablet (81 mg total) by mouth daily. Swallow whole. 09/13/19   Shahmehdi, Valeria Batman, MD  atorvastatin (LIPITOR) 80 MG tablet Take 1 tablet (80 mg total) by mouth at bedtime. 11/18/21   Karamalegos, Devonne Doughty, DO  gabapentin (NEURONTIN) 400 MG capsule Take 400 mg by mouth in the morning and at bedtime. 400 mg twice a day as per patient --psychiatrist changed that Normally takes 400 mg daily 09/29/19   [provider]  lisinopril (ZESTRIL) 20 MG tablet Take 1 tablet (20 mg total) by mouth daily. 11/18/21   Karamalegos, Devonne Doughty, DO  sildenafil (REVATIO) 20 MG tablet Take 4-5 tablets (80-100 mg total) by mouth as needed (ED). 11/18/21   Karamalegos, Devonne Doughty, DO  tamsulosin (FLOMAX) 0.4 MG CAPS capsule Take 1 capsule (0.4 mg total) by mouth in the morning and at bedtime. 11/18/21   Olin Hauser, DO     Allergies  Patient has no known allergies.   Family History    Family History  Problem Relation Age of Onset   Arthritis Mother    Hypertension Mother    Diabetes Father    Hypertension Father      Physical Exam  Triage Vital Signs: ED Triage Vitals  Enc Vitals Group     BP --      Pulse --      Resp --      Temp --      Temp src --      SpO2 --      Weight 11/29/21 0252 170 lb (77.1 kg)     Height --      Head Circumference --      Peak Flow --      Pain Score 11/29/21 0251 0     Pain Loc --      Pain Edu? --      Excl. in [place]? --     Updated Vital Signs: BP (!) 174/83   Pulse  85   Temp 98.8 F (37.1 C) (Oral)   Resp 18   Wt 77.1 kg   SpO2 90%   BMI 27.44 kg/m    General: Awake, no distress.  CV:  RRR.  Good peripheral perfusion.  Resp:  Increased effort.  Diminished bibasilarly Abd:  Nontender.  No distention.  Other:  Bilateral calves are nontender and not swollen.   ED Results / Procedures / Treatments  Labs (all labs ordered are listed, but only abnormal results are displayed) Labs Reviewed  CBC WITH DIFFERENTIAL/PLATELET - Abnormal; Notable for the following components:      Result Value   WBC 14.4 (*)    Neutro Abs 11.0 (*)    All other components within normal limits  COMPREHENSIVE METABOLIC PANEL - Abnormal; Notable for the following components:   Glucose, Bld 106 (*)    Calcium 8.7 (*)    All other components within normal limits  TROPONIN I (HIGH SENSITIVITY) - Abnormal; Notable for the following components:   Troponin I (High Sensitivity) 36 (*)    All other components within normal limits  TROPONIN I (HIGH SENSITIVITY) - Abnormal; Notable for the following components:   Troponin I (High Sensitivity) 33 (*)    All other components within normal limits  RESP PANEL BY RT-PCR (FLU A&B, COVID) ARPGX2  LIPASE, BLOOD     EKG  ED ECG REPORT I, Jordane Hisle J, the attending physician, personally viewed and interpreted this ECG.   Date: 11/29/2021  EKG Time: 0255  Rate: 91  Rhythm: normal sinus  rhythm  Axis: LAD  Intervals:none  ST&T Change: Nonspecific  ED ECG REPORT I, Analucia Hush J, the attending physician, personally viewed and interpreted this ECG.   Date: 11/29/2021  EKG Time: 0349  Rate: 90  Rhythm: normal sinus rhythm  Axis: LAD  Intervals:none  ST&T Change: Nonspecific   RADIOLOGY I have independently visualized and interpreted patient's chest x-ray as well as noted the radiology interpretation:  Chest x-ray: No acute cardiopulmonary process  Official radiology report(s): DG Chest 2 View  Result Date: 11/29/2021 CLINICAL DATA:  Shortness of breath EXAM: CHEST - 2 VIEW COMPARISON:  Two days ago FINDINGS: Normal heart size and mediastinal contours. Chronic interstitial coarsening. No acute infiltrate or edema. No effusion or pneumothorax. No acute osseous findings. IMPRESSION: No active cardiopulmonary disease. Electronically Signed   By: Tiburcio Pea M.D.   On: 11/29/2021 03:44     PROCEDURES:  Critical Care performed: Yes, see critical care procedure note(s)  CRITICAL CARE Performed by: Irean Hong   Total critical care time: 30 minutes  Critical care time was exclusive of separately billable procedures and treating other patients.  Critical care was necessary to treat or prevent imminent or life-threatening deterioration.  Critical care was time spent personally by me on the following activities: development of treatment plan with patient and/or surrogate as well as nursing, discussions with consultants, evaluation of patient's response to treatment, examination of patient, obtaining history from patient or surrogate, ordering and performing treatments and interventions, ordering and review of laboratory studies, ordering and review of radiographic studies, pulse oximetry and re-evaluation of patient's condition.   Marland Kitchen1-3 Lead EKG Interpretation  Performed by: Irean Hong, MD Authorized by: Irean Hong, MD     Interpretation: normal     ECG rate:   90   ECG rate assessment: normal     Rhythm: sinus rhythm     Ectopy: none     Conduction: normal  Comments:     Patient placed on cardiac monitor to evaluate for arrhythmias    MEDICATIONS ORDERED IN ED: Medications  ipratropium-albuterol (DUONEB) 0.5-2.5 (3) MG/3ML nebulizer solution 3 mL (has no administration in time range)  LORazepam (ATIVAN) injection 0.5 mg (has no administration in time range)  sodium chloride 0.9 % bolus 1,000 mL (0 mLs Intravenous Stopped 11/29/21 0351)  methylPREDNISolone sodium succinate (SOLU-MEDROL) 125 mg/2 mL injection 125 mg (125 mg Intravenous Given 11/29/21 0305)  ipratropium-albuterol (DUONEB) 0.5-2.5 (3) MG/3ML nebulizer solution 3 mL (3 mLs Nebulization Given 11/29/21 0304)  hydrALAZINE (APRESOLINE) injection 10 mg (10 mg Intravenous Given 11/29/21 0407)     IMPRESSION / MDM / ASSESSMENT AND PLAN / ED COURSE  I reviewed the triage vital signs and the nursing notes.                             66 year old male with COPD presenting with shortness of breath. Differential includes, but is not limited to, viral syndrome, bronchitis including COPD exacerbation, pneumonia, reactive airway disease including asthma, CHF including exacerbation with or without pulmonary/interstitial edema, pneumothorax, ACS, thoracic trauma, and pulmonary embolism.  I have personally reviewed patient's records and note his hospitalization 11/3 - 11/28/2021.  Does not look like respiratory panel was obtained.  Patient's presentation is most consistent with acute presentation with potential threat to life or bodily function.  The patient is on the cardiac monitor to evaluate for evidence of arrhythmia and/or significant heart rate changes.  We will obtain cardiac panel, chest x-ray.  Administer 125mg  IV Solu-Medrol, DuoNeb; will reassess.  Clinical Course as of 11/29/21 0530  Sun Nov 29, 2021  0357 Laboratory results demonstrate WBC 14.4, normal electrolytes, initial troponin  36, respiratory panel and chest x-ray negative.  Patient Pete troponin 41 during his hospitalization.  Will repeat.  Will administer IV hydralazine for elevated blood pressure.  Given that patient uses cocaine, will avoid beta-blocker.  We will continue to monitor and care for patient. [JS]  0447 Aeration improved.  Patient sleeping.  Room air saturation 94%.  Awaiting repeat troponin. [JS]  5284 Room air saturation 89%; placed on 2 L nasal cannula oxygen with improvement to 94%.  Repeat troponin remained stable.  Will administer second DuoNeb.  Blood pressure remains elevated; in light of recent cocaine use, will administer low-dose IV Ativan.  Will consult hospital services for evaluation and discussion. [JS]    Clinical Course User Index [JS] Paulette Blanch, MD     FINAL CLINICAL IMPRESSION(S) / ED DIAGNOSES   Final diagnoses:  COPD exacerbation ([place])  Hypoxia     Rx / DC Orders   ED Discharge Orders     None        Note:  This document was prepared using Dragon voice recognition software and may include unintentional dictation errors.   Paulette Blanch, MD 11/29/21 [phone]

## 2021-11-29 NOTE — TOC CM/SW Note (Signed)
LCSW attempted to complete assessment at bedside. Pt sleeping and unavailable to complete. TOC will f/up when able

## 2021-11-29 NOTE — H&P (Addendum)
History and Physical    Ryan [name]:[id number] DOB: May 22, 1955 DOA: 11/29/2021  Referring MD/NP/PA:   PCP: Smitty Cords, DO   Patient coming from:  The patient is coming from home.  At baseline, pt is independent for most of ADL.        Chief Complaint: SOB  HPI: Ryan [name] is a 66 y.o. 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  Patient was recently hospitalized from 10/3 - 10/4 due to near syncope and hypotension, which was likely due to dehydration.  Patient also had AKI which has resolved.  UDS was positive for cocaine the last admission. He comes back due to shortness of breath.  Patient states that he developed shortness of breath since yesterday, which has been progressively worsening.  Patient has productive cough with greenish colored sputum production.  Denies chest pain, fever or chills.  Patient does not have nausea, vomiting, abdominal pain.  Patient has some mild loose stool bowel movement, but denies active diarrhea.   Patient is not using oxygen normally at home.  He was found to have oxygen desaturation to 89% on room air, which improved to 95% on 2 L oxygen.   Data reviewed independently and ED Course: pt was found to have WBC 14.4, troponin level 36, 33, negative COVID PCR, GFR> 60, temperature normal, blood pressure 163/87, heart rate 94, RR 27.  Chest x-ray negative.  Patient is admitted to telemetry bed as inpatient.   EKG: I have personally reviewed.  Sinus rhythm, QTc 452, LAD, poor R wave progression.   Review of Systems:   General: no fevers, chills, no body weight gain, has fatigue HEENT: no blurry vision, hearing changes or sore throat Respiratory: has dyspnea, coughing, no wheezing CV: no chest pain, no palpitations GI: no nausea, vomiting, abdominal pain,  diarrhea, constipation GU: no dysuria, burning on urination, increased urinary frequency, hematuria  Ext: no leg edema Neuro: no unilateral weakness, numbness, or tingling, no vision change or hearing loss Skin: no rash, no skin tear. MSK: No muscle spasm, no deformity, no limitation of range of movement in spin Heme: No easy bruising.  Travel history: No recent long distant travel.   Allergy: No Known Allergies  Past Medical History:  Diagnosis Date   Anxiety    Arthritis    left hand   Asthma    Chronic kidney disease    HAS HAD KIDNEY STONE 2015-- JUST PASSED   COPD (chronic obstructive pulmonary disease) (HCC)    Dyspnea    GERD (gastroesophageal reflux disease)    TAKES TUMS FOR RELIEF   Hyperlipidemia    Hypertension    Improved, No longer on meds   Lower urinary tract symptoms (LUTS)    Prostate disease    Stroke [facility])    Thyroid disease    Wears dentures    hass full upper plate, not wearing, broken   Wears hearing aid     Past Surgical History:  Procedure Laterality Date   BACK SURGERY     CATARACT EXTRACTION W/PHACO Left 01/06/2016   Procedure: CATARACT EXTRACTION PHACO AND INTRAOCULAR LENS PLACEMENT (IOC);  Surgeon: Nevada Crane, MD;  Location: [facility];  Service: Ophthalmology;  Laterality: Left;  LEFT   COLONOSCOPY WITH PROPOFOL N/A 04/19/2016   Procedure: COLONOSCOPY WITH PROPOFOL;  Surgeon: Wilber Bihari  Mechele Collin, MD;  Location: [facility] ENDOSCOPY;  Service: Endoscopy;  Laterality: N/A;   ESOPHAGOGASTRODUODENOSCOPY (EGD) WITH PROPOFOL N/A 10/24/2015   Procedure: ESOPHAGOGASTRODUODENOSCOPY (EGD) WITH PROPOFOL;  Surgeon: Scot Jun, MD;  Location: [facility] ENDOSCOPY;  Service: Endoscopy;  Laterality: N/A;   EYE SURGERY     HERNIA REPAIR     ERRONEOUS UMBILICAL HERNIA   2011   MYRINGOTOMY WITH TUBE PLACEMENT Bilateral 03/28/2015   Procedure: MYRINGOTOMY WITH TUBE PLACEMENT;  Surgeon: Linus Salmons, MD;  Location: [facility];  Service: ENT;   Laterality: Bilateral;  BUTTERFLY TUBE   SINUSOTOMY  02/28/2019   TONSILLECTOMY     TRANSFORAMINAL LUMBAR INTERBODY FUSION (TLIF) WITH PEDICLE SCREW FIXATION 1 LEVEL Right 06/18/2014   Procedure: TRANSFORAMINAL LUMBAR INTERBODY FUSION (TLIF) WITH PEDICLE SCREW FIXATION 1 LEVEL LUMBAR 5 -SACRAL 1;  Surgeon: Aliene Beams, MD;  Location: [facility]URO [facility];  Service: Neurosurgery;  Laterality: Right;  Right transforaminal lumbar interbody fusion with interbody prosthesis and percutaneous pedicle screws Lumbar 5 to Sacral 1    Social History:  reports that he has been smoking cigarettes. He has a 60.00 pack-year smoking history. He uses smokeless tobacco. He reports current alcohol use. He reports current drug use. Drugs: Marijuana and "Crack" cocaine.  Family History:  Family History  Problem Relation Age of Onset   Arthritis Mother    Hypertension Mother    Diabetes Father    Hypertension Father      Prior to Admission medications   Medication Sig Start Date End Date Taking? Authorizing Provider  aspirin EC 81 MG EC tablet Take 1 tablet (81 mg total) by mouth daily. Swallow whole. 09/13/19  Yes Shahmehdi, Seyed A, MD  atorvastatin (LIPITOR) 80 MG tablet Take 1 tablet (80 mg total) by mouth at bedtime. 11/18/21  Yes Karamalegos, Netta Neat, DO  gabapentin (NEURONTIN) 400 MG capsule Take 400 mg by mouth in the morning and at bedtime. 400 mg twice a day as per patient --psychiatrist changed that Normally takes 400 mg daily 09/29/19  Yes [provider]  lisinopril (ZESTRIL) 20 MG tablet Take 1 tablet (20 mg total) by mouth daily. 11/18/21  Yes Karamalegos, Netta Neat, DO  tamsulosin (FLOMAX) 0.4 MG CAPS capsule Take 1 capsule (0.4 mg total) by mouth in the morning and at bedtime. 11/18/21  Yes Karamalegos, Alexander J, DO  albuterol (PROVENTIL) (2.5 MG/3ML) 0.083% nebulizer solution Take 3 mLs (2.5 mg total) by nebulization every 6 (six) hours as needed for wheezing or shortness of breath.  11/18/21   Karamalegos, Netta Neat, DO  albuterol (VENTOLIN HFA) 108 (90 Base) MCG/ACT inhaler Inhale 2 puffs into the lungs every 6 (six) hours as needed for wheezing or shortness of breath. 11/18/21   Karamalegos, Netta Neat, DO  ALPRAZolam Prudy Feeler) 1 MG tablet Take 1 tablet (1 mg total) by mouth 2 (two) times daily as needed. 11/28/21   Esaw Grandchild A, DO  sildenafil (REVATIO) 20 MG tablet Take 4-5 tablets (80-100 mg total) by mouth as needed (ED). 11/18/21   Smitty Cords, DO    Physical Exam: Vitals:   11/29/21 0630 11/29/21 0700 11/29/21 0845 11/29/21 0909  BP: (!) 163/87 (!) 180/77 (!) 156/86 (!) 165/85  Pulse: 86 92 88 88  Resp: 18 20 20    Temp:    98.7 F (37.1 C)  TempSrc:      SpO2: 94% 94% 92% 95%  Weight:       General: Not in acute distress HEENT:  Eyes: PERRL, EOMI, no scleral icterus.       ENT: No discharge from the ears and nose, no pharynx injection, no tonsillar enlargement.        Neck: No JVD, no bruit, no mass felt. Heme: No neck lymph node enlargement. Cardiac: S1/S2, RRR, No murmurs, No gallops or rubs. Respiratory: Has scattered rhonchi, no wheezing on my exam. GI: Soft, nondistended, nontender, no rebound pain, no organomegaly, BS present. GU: No hematuria Ext: No pitting leg edema bilaterally. 1+DP/PT pulse bilaterally. Musculoskeletal: No joint deformities, No joint redness or warmth, no limitation of ROM in spin. Skin: No rashes.  Neuro: Alert, oriented X3, cranial nerves II-XII grossly intact, moves all extremities normally.   Psych: Patient is not psychotic, no suicidal or hemocidal ideation.  Labs on Admission: I have personally reviewed following labs and imaging studies  CBC: Recent Labs  Lab 11/25/21 2342 11/27/21 0832 11/28/21 0607 11/29/21 0254  WBC 15.6* 12.6* 10.2 14.4*  NEUTROABS 11.7* 8.7*  --  11.0*  HGB 16.3 13.6 13.4 13.8  HCT 48.4 40.5 39.8 40.8  MCV 93.8 94.2 94.8 94.0  PLT 273 217 182 210   Basic  Metabolic Panel: Recent Labs  Lab 11/25/21 2342 11/27/21 0832 11/28/21 0607 11/29/21 0254  NA 135 137 136 137  K 3.6 3.2* 3.8 3.6  CL 97* 102 106 102  CO2 24 25 24 26   GLUCOSE 127* 118* 92 106*  BUN 33* 42* 22 16  CREATININE 2.44* 1.65* 0.87 0.86  CALCIUM 9.7 8.6* 8.4* 8.7*  MG  --  1.9  --   --   PHOS  --  4.3  --   --    GFR: Estimated Creatinine Clearance: 82.6 mL/min (by C-G formula based on SCr of 0.86 mg/dL). Liver Function Tests: Recent Labs  Lab 11/25/21 2342 11/27/21 0832 11/29/21 0254  AST 20 19 20   ALT 17 14 14   ALKPHOS 143* 121 119  BILITOT 0.9 0.7 0.9  PROT 7.4 6.7 6.9  ALBUMIN 4.4 3.9 3.7   Recent Labs  Lab 11/29/21 0254  LIPASE 36   No results for input(s): "AMMONIA" in the last 168 hours. Coagulation Profile: No results for input(s): "INR", "PROTIME" in the last 168 hours. Cardiac Enzymes: No results for input(s): "CKTOTAL", "CKMB", "CKMBINDEX", "TROPONINI" in the last 168 hours. BNP (last 3 results) No results for input(s): "PROBNP" in the last 8760 hours. HbA1C: No results for input(s): "HGBA1C" in the last 72 hours. CBG: Recent Labs  Lab 11/25/21 2336  GLUCAP 136*   Lipid Profile: No results for input(s): "CHOL", "HDL", "LDLCALC", "TRIG", "CHOLHDL", "LDLDIRECT" in the last 72 hours. Thyroid Function Tests: No results for input(s): "TSH", "T4TOTAL", "FREET4", "T3FREE", "THYROIDAB" in the last 72 hours. Anemia Panel: No results for input(s): "VITAMINB12", "FOLATE", "FERRITIN", "TIBC", "IRON", "RETICCTPCT" in the last 72 hours. Urine analysis:    Component Value Date/Time   COLORURINE YELLOW (A) 11/27/2021 0832   APPEARANCEUR CLEAR (A) 11/27/2021 0832   APPEARANCEUR Cloudy 07/15/2011 1713   LABSPEC 1.010 11/27/2021 0832   LABSPEC 1.019 07/15/2011 1713   PHURINE 5.0 11/27/2021 0832   GLUCOSEU NEGATIVE 11/27/2021 0832   GLUCOSEU Negative 07/15/2011 1713   HGBUR NEGATIVE 11/27/2021 0832   BILIRUBINUR NEGATIVE 11/27/2021 0832    BILIRUBINUR Negative 07/15/2011 1713   KETONESUR NEGATIVE 11/27/2021 0832   PROTEINUR NEGATIVE 11/27/2021 0832   NITRITE NEGATIVE 11/27/2021 0832   LEUKOCYTESUR NEGATIVE 11/27/2021 0832   LEUKOCYTESUR Negative 07/15/2011 1713   Sepsis Labs: @LABRCNTIP (procalcitonin:4,lacticidven:4) ) Recent  Results (from the past 240 hour(s))  Blood culture (routine x 2)     Status: None (Preliminary result)   Collection Time: 11/26/21  1:15 AM   Specimen: BLOOD  Result Value Ref Range Status   Specimen Description BLOOD BLOOD RIGHT ARM  Final   Special Requests   Final    BOTTLES DRAWN AEROBIC AND ANAEROBIC Blood Culture adequate volume   Culture   Final    NO GROWTH 3 DAYS Performed at [facility], [address], [place], [place] [address]    Report Status PENDING  Incomplete  Blood culture (routine x 2)     Status: None (Preliminary result)   Collection Time: 11/26/21  1:17 AM   Specimen: BLOOD  Result Value Ref Range Status   Specimen Description BLOOD BLOOD LEFT ARM  Final   Special Requests   Final    BOTTLES DRAWN AEROBIC AND ANAEROBIC Blood Culture adequate volume   Culture   Final    NO GROWTH 3 DAYS Performed at [facility], [address], [place], [place] [address]    Report Status PENDING  Incomplete  Urine Culture     Status: None   Collection Time: 11/27/21  8:32 AM   Specimen: Urine, Clean Catch  Result Value Ref Range Status   Specimen Description   Final    URINE, CLEAN CATCH Performed at [facility], [address], [place], [place] [address]    Special Requests   Final    NONE Performed at [facility], [address] [place], [place] [address]    Culture   Final    NO GROWTH Performed at [facility], [address] [place], [place] [address]    Report Status 11/28/2021 FINAL  Final  Culture, blood (Routine X 2) w Reflex to ID Panel     Status: None (Preliminary result)   Collection Time: 11/27/21 10:47 AM    Specimen: BLOOD  Result Value Ref Range Status   Specimen Description BLOOD LEFT ANTECUBITAL  Final   Special Requests   Final    BOTTLES DRAWN AEROBIC AND ANAEROBIC Blood Culture results may not be optimal due to an excessive volume of blood received in culture bottles   Culture   Final    NO GROWTH 2 DAYS Performed at [facility], [address], [place], [place] [address]    Report Status PENDING  Incomplete  Culture, blood (Routine X 2) w Reflex to ID Panel     Status: None (Preliminary result)   Collection Time: 11/27/21 12:05 PM   Specimen: BLOOD  Result Value Ref Range Status   Specimen Description BLOOD LWRIST  Final   Special Requests   Final    BOTTLES DRAWN AEROBIC AND ANAEROBIC Blood Culture adequate volume   Culture   Final    NO GROWTH 2 DAYS Performed at [facility], [address] [place], [place] [address]    Report Status PENDING  Incomplete  Resp Panel by RT-PCR (Flu A&B, Covid) Anterior Nasal Swab     Status: None   Collection Time: 11/29/21  2:55 AM   Specimen: Anterior Nasal Swab  Result Value Ref Range Status   SARS Coronavirus 2 by RT PCR NEGATIVE NEGATIVE Final    Comment: (NOTE) SARS-CoV-2 target nucleic acids are NOT DETECTED.  The SARS-CoV-2 RNA is generally detectable in upper respiratory specimens during the acute phase of infection. The lowest concentration of SARS-CoV-2 viral copies this assay  can detect is 138 copies/mL. A negative result does not preclude SARS-Cov-2 infection and should not be used as the sole basis for treatment or other patient management decisions. A negative result may occur with  improper specimen collection/handling, submission of specimen other than nasopharyngeal swab, presence of viral mutation(s) within the areas targeted by this assay, and inadequate number of viral copies(<138 copies/mL). A negative result must be combined with clinical observations, patient history, and  epidemiological information. The expected result is Negative.  Fact Sheet for Patients:  BloggerCourse.com  Fact Sheet for Healthcare Providers:  SeriousBroker.it  This test is no t yet approved or cleared by the [place] FDA and  has been authorized for detection and/or diagnosis of SARS-CoV-2 by FDA under an Emergency Use Authorization (EUA). This EUA will remain  in effect (meaning this test can be used) for the duration of the COVID-19 declaration under Section 564(b)(1) of the Act, 21 U.S.C.section 360bbb-3(b)(1), unless the authorization is terminated  or revoked sooner.       Influenza A by PCR NEGATIVE NEGATIVE Final   Influenza B by PCR NEGATIVE NEGATIVE Final    Comment: (NOTE) The Xpert Xpress SARS-CoV-2/FLU/RSV plus assay is intended as an aid in the diagnosis of influenza from Nasopharyngeal swab specimens and should not be used as a sole basis for treatment. Nasal washings and aspirates are unacceptable for Xpert Xpress SARS-CoV-2/FLU/RSV testing.  Fact Sheet for Patients: BloggerCourse.com  Fact Sheet for Healthcare Providers: SeriousBroker.it  This test is not yet approved or cleared by the [place] FDA and has been authorized for detection and/or diagnosis of SARS-CoV-2 by FDA under an Emergency Use Authorization (EUA). This EUA will remain in effect (meaning this test can be used) for the duration of the COVID-19 declaration under Section 564(b)(1) of the Act, 21 U.S.C. section 360bbb-3(b)(1), unless the authorization is terminated or revoked.  Performed at [facility], [address], [place], [place] [address]      Radiological Exams on Admission: CT Angio Chest Pulmonary Embolism (PE) W or WO Contrast  Result Date: 11/29/2021 CLINICAL DATA:  Shortness of breath. EXAM: CT ANGIOGRAPHY CHEST WITH CONTRAST TECHNIQUE: Multidetector CT  imaging of the chest was performed using the standard protocol during bolus administration of intravenous contrast. Multiplanar CT image reconstructions and MIPs were obtained to evaluate the vascular anatomy. RADIATION DOSE REDUCTION: This exam was performed according to the departmental dose-optimization program which includes automated exposure control, adjustment of the mA and/or kV according to patient size and/or use of iterative reconstruction technique. CONTRAST:  75mL OMNIPAQUE IOHEXOL 350 MG/ML SOLN COMPARISON:  06/22/2017 FINDINGS: Cardiovascular: The heart size is normal. No substantial pericardial effusion. Coronary artery calcification is evident. Mild atherosclerotic calcification is noted in the wall of the thoracic aorta. There is no filling defect within the opacified pulmonary arteries to suggest the presence of an acute pulmonary embolus. Mediastinum/Nodes: Normal to upper normal mediastinal and bilateral hilar lymph nodes evident. The esophagus has normal imaging features. There is no axillary lymphadenopathy. Lungs/Pleura: Diffuse bronchial wall thickening is basilar predominant with centrilobular ground-glass opacity in the upper lungs bilaterally. Areas of peripheral airway impaction are noted in the dependent lower lobes bilaterally. No suspicious pulmonary nodule or mass. No focal airspace consolidation. No pleural effusion. Upper Abdomen: Unremarkable. Musculoskeletal: No worrisome lytic or sclerotic osseous abnormality. Advanced degenerative changes noted C7-T1 Review of the MIP images confirms the above findings. IMPRESSION: 1. No CT evidence for acute pulmonary embolus. 2. Diffuse bronchial wall thickening with  upper lung predominant centrilobular ground-glass opacity and areas of peripheral Lapaglia airway impaction in the dependent lower lobes. Imaging features are compatible with infectious/inflammatory etiology. 3. Coronary artery calcification. 4.  Aortic Atherosclerosis  (ICD10-I70.0). Electronically Signed   By: Kennith CenterEric  Mansell M.D.   On: 11/29/2021 08:48   DG Chest 2 View  Result Date: 11/29/2021 CLINICAL DATA:  Shortness of breath EXAM: CHEST - 2 VIEW COMPARISON:  Two days ago FINDINGS: Normal heart size and mediastinal contours. Chronic interstitial coarsening. No acute infiltrate or edema. No effusion or pneumothorax. No acute osseous findings. IMPRESSION: No active cardiopulmonary disease. Electronically Signed   By: Tiburcio PeaJonathan  Watts M.D.   On: 11/29/2021 03:44      Assessment/Plan Principal Problem:   COPD exacerbation (HCC) Active Problems:   Severe sepsis (HCC)   Myocardial injury   Essential hypertension   History of CVA (cerebrovascular accident) without residual deficits   HLD (hyperlipidemia)   BPH (benign prostatic hyperplasia)   Anxiety   Tobacco abuse   Alcohol use   Polysubstance abuse (HCC)   Assessment and Plan:  COPD exacerbation and severe sepsis due to COPD exacerbation: His shortness breath is possibly due to COPD exacerbation given scattered rhonchi and productive cough.  Another potential differential diagnosis is PE.  Given recent near syncope, and worsening shortness breath today, will get CTA to rule out PE. Patient has 2 L new oxygen requirement, but in no acute respiratory distress.  Chest x-ray negative.  COVID PCR negative.  Patient admits criteria for sepsis with WBC 14.4, heart rate 94, RR 27.  Lactic acid level 1.7 --> 2.2  -will admit to tele bed as inpatient -Bronchodilators -Solu-Medrol 40 mg IV bid -Z pak  -Mucinex for cough  -Incentive spirometry -sputum culture -Nasal cannula oxygen as needed to maintain O2 saturation 93% or greater -IV fluid: total of 2.5 L of LR and then 75 cc/h of NS -Trend lactic acid levels  - procalcitonin level --> < 0.10 - f/u CTA to r/o PR  Myocardial injury due to COPD exacerbation and possible cocaine use: Troponin level 36 --> 33.  No chest pain.   -Aspirin, Lipitor -Check  FLP -Patient had A1c 5.8 on 11/18/2021, will not repeat today  Essential hypertension -lisinopril -IV hydralazine as needed   History of CVA (cerebrovascular accident) without residual deficits -Aspirin, Lipitor   HLD (hyperlipidemia) -Lipitor     BPH (benign prostatic hyperplasia) -Flomax   Polysubstance abuse: Including tobacco and cocaine.  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    Severity of Illness:  The appropriate patient status for this patient is INPATIENT. Inpatient status is judged to be reasonable and necessary in order to  provide the required intensity of service to ensure the patient's safety. The patient's presenting symptoms, physical exam findings, and initial radiographic and laboratory data in the context of their chronic comorbidities is felt to place them at high risk for further clinical deterioration. Furthermore, it is not anticipated that the patient will be medically stable for discharge from the hospital within 2 midnights of admission.   * I certify that at the point of admission it is my clinical judgment that the patient will require inpatient hospital care spanning beyond 2 midnights from the point of admission due to high intensity of service, high risk for further deterioration and high frequency of surveillance required.*       Date of Service  11/29/2021    Ivor Costa [facility]   If 7PM-7AM, please contact night-coverage [email or web address] 11/29/2021, 1:29 PM

## 2021-11-30 DIAGNOSIS — J441 Chronic obstructive pulmonary disease with (acute) exacerbation: Secondary | ICD-10-CM | POA: Diagnosis not present

## 2021-11-30 DIAGNOSIS — F1910 Other psychoactive substance abuse, uncomplicated: Secondary | ICD-10-CM | POA: Diagnosis not present

## 2021-11-30 LAB — CBC
HCT: 39 % (ref 39.0–52.0)
Hemoglobin: 13.2 g/dL (ref 13.0–17.0)
MCH: 31.7 pg (ref 26.0–34.0)
MCHC: 33.8 g/dL (ref 30.0–36.0)
MCV: 93.8 fL (ref 80.0–100.0)
Platelets: 237 10*3/uL (ref 150–400)
RBC: 4.16 MIL/uL — ABNORMAL LOW (ref 4.22–5.81)
RDW: 12.1 % (ref 11.5–15.5)
WBC: 19.5 10*3/uL — ABNORMAL HIGH (ref 4.0–10.5)
nRBC: 0 % (ref 0.0–0.2)

## 2021-11-30 LAB — LIPID PANEL
Cholesterol: 84 mg/dL (ref 0–200)
HDL: 40 mg/dL — ABNORMAL LOW (ref >40)
LDL Cholesterol: 39 mg/dL (ref 0–99)
Total CHOL/HDL Ratio: 2.1 RATIO
Triglycerides: 26 mg/dL (ref <150)
VLDL: 5 mg/dL (ref 0–40)

## 2021-11-30 LAB — BASIC METABOLIC PANEL
Anion gap: 9 (ref 5–15)
BUN: 12 mg/dL (ref 8–23)
CO2: 24 mmol/L (ref 22–32)
Calcium: 8.9 mg/dL (ref 8.9–10.3)
Chloride: 104 mmol/L (ref 98–111)
Creatinine, Ser: 0.75 mg/dL (ref 0.61–1.24)
GFR, Estimated: >60 mL/min (ref >60)
Glucose, Bld: 191 mg/dL — ABNORMAL HIGH (ref 70–99)
Potassium: 3.5 mmol/L (ref 3.5–5.1)
Sodium: 137 mmol/L (ref 135–145)

## 2021-11-30 LAB — EXPECTORATED SPUTUM ASSESSMENT W GRAM STAIN, RFLX TO RESP C

## 2021-11-30 MED ORDER — PREDNISONE 10 MG PO TABS: ORAL_TABLET | ORAL | 0 refills | Status: AC

## 2021-11-30 MED ORDER — FLUTICASONE FUROATE-VILANTEROL 200-25 MCG/ACT IN AEPB: 1.0000 | INHALATION_SPRAY | Freq: Every day | RESPIRATORY_TRACT | Status: DC

## 2021-11-30 MED ORDER — NICOTINE 21 MG/24HR TD PT24: 21.0000 mg | MEDICATED_PATCH | Freq: Every day | TRANSDERMAL | 0 refills | Status: DC

## 2021-11-30 NOTE — Care Management Obs Status (Signed)
[facility] NOTIFICATION   Patient Details  Name: [name] MRN: [id number] Date of Birth: August 05, 1955   Medicare Observation Status Notification Given:  Yes    Beverly Sessions, RN 11/30/2021, 10:36 AM

## 2021-11-30 NOTE — Care Management CC44 (Signed)
Condition Code 44 Documentation Completed  Patient Details  Name: Ryan [name] MRN: [id number] Date of Birth: September 13, 1955   Condition Code 44 given:  Yes Patient signature on Condition Code 44 notice:  Yes Documentation of 2 MD's agreement:  Yes Code 44 added to claim:  Yes    Beverly Sessions, RN 11/30/2021, 10:36 AM

## 2021-11-30 NOTE — TOC Initial Note (Signed)
Transition of Care [facility]) - Initial/Assessment Note    Patient Details  Name: Ryan [name] MRN: [id number] Date of Birth: 1955/05/04  Transition of Care [facility]) CM/SW Contact:    Beverly Sessions, RN Phone Number: 11/30/2021, 10:15 AM  Clinical Narrative:                     Transition of Care [facility]) Screening Note   Patient Details  Name: Ryan [name] Date of Birth: 05-Jan-1956   Transition of Care [facility]) CM/SW Contact:    Beverly Sessions, RN Phone Number: 11/30/2021, 10:15 AM    Transition of Care Department [facility]) has reviewed patient and no TOC needs have been identified at this time. We will continue to monitor patient advancement through interdisciplinary progression rounds. If new patient transition needs arise, please place a TOC consult.  Confirmed with MD no toc needs for discharge Bedside RN has check RA sats with exertion and patient maintains sats on RA     Patient Goals and CMS Choice        Expected Discharge Plan and Services                                                Prior Living Arrangements/Services                       Activities of Daily Living Home Assistive Devices/Equipment: None ADL Screening (condition at time of admission) Patient's cognitive ability adequate to safely complete daily activities?: No Is the patient deaf or have difficulty hearing?: No Does the patient have difficulty seeing, even when wearing glasses/contacts?: Yes Does the patient have difficulty concentrating, remembering, or making decisions?: Yes Patient able to express need for assistance with ADLs?: Yes Does the patient have difficulty dressing or bathing?: No Independently performs ADLs?: Yes (appropriate for developmental age) Does the patient have difficulty walking or climbing stairs?: No Weakness of Legs: None Weakness of Arms/Hands: None  Permission Sought/Granted                  Emotional Assessment               Admission diagnosis:  Hypoxia [R09.02] COPD exacerbation ([place]) [J44.1] Patient Active Problem List   Diagnosis Date Noted   COPD exacerbation ([place]) 11/29/2021   Polysubstance abuse ([place]) 11/29/2021   Myocardial injury 11/29/2021   Severe sepsis ([place]) 11/29/2021   Hypotension 11/27/2021   COPD (chronic obstructive pulmonary disease) ([place]) 11/27/2021   AKI (acute kidney injury) ([place]) 11/27/2021   Anxiety 11/27/2021   Tobacco abuse 11/27/2021   Alcohol use 11/27/2021   Hypokalemia 11/27/2021   SIRS (systemic inflammatory response syndrome) ([place]) 11/27/2021   Near syncope 11/27/2021   BPH (benign prostatic hyperplasia) 11/27/2021   Alcohol-induced mood disorder ([place]) 08/02/2020   History of CVA (cerebrovascular accident) without residual deficits 10/23/2019   Pre-diabetes 10/23/2019   HLD (hyperlipidemia) 10/23/2019   Chronic pain syndrome 08/15/2019   Lumbar spondylosis 03/07/2018   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 [place]) [place] [address] Phone: [phone] Fax: [phone]     Social Determinants of Health (SDOH) Interventions    Readmission Risk Interventions     No data to display

## 2021-11-30 NOTE — Progress Notes (Signed)
Discharge instructions were reviewed with pt. Questions were answered. IV was taken out. Pt denies pain at this time. Belongings were collected by pt.

## 2021-11-30 NOTE — Plan of Care (Signed)

## 2021-11-30 NOTE — Progress Notes (Signed)
SATURATION QUALIFICATIONS: (This note is used to comply with regulatory documentation for home oxygen)  Patient Saturations on Room Air at Rest = 92%  Patient Saturations on Room Air while Ambulating = 90%  Patient Saturations on 0 Liters of oxygen while Ambulating = 92%  Please briefly explain why patient needs home oxygen:  Pt does not required O2 to go home. MD was notified about pt's walking test results. Pt was cleared by MD Dr. Roosevelt Locks to go home without O2.

## 2021-11-30 NOTE — Discharge Summary (Signed)
Physician Discharge Summary   Patient: Ryan [name] MRN: [id number] DOB: 1955/06/06  Admit date:     11/29/2021  Discharge date: 11/30/21  Discharge Physician: Marrion Coyekui Lora Glomski   PCP: Smitty CordsKaramalegos, Alexander J, DO   Recommendations at discharge:   Follow-up with PCP in 1 week.  Discharge Diagnoses: Principal Problem:   COPD exacerbation (HCC) Active Problems:   Severe sepsis (HCC)   Myocardial injury   Essential hypertension   History of CVA (cerebrovascular accident) without residual deficits   HLD (hyperlipidemia)   BPH (benign prostatic hyperplasia)   Anxiety   Tobacco abuse   Alcohol use   Polysubstance abuse (HCC) Acute hypoxemia. Resolved Problems:   * No resolved hospital problems. [facility]*  Hospital Course:  Ryan [name] is a 66 y.o. 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  He is diagnosed with COPD is asked the patient, was placed on IV steroids.  Patient was also put on 2 L oxygen with oxygen saturation 89% on room air.  He did not have any respite distress.   Condition improved today, he was able to walk with RT, oxygen saturation remains at 90 to 91%.  No need for home oxygen.  Patient is adamant to leave the hospital today even if he is not ready.  He states that he had to go to court tomorrow, he also need to take care of his mother who has a significant dementia.  At this point, he will be discharged.    Assessment and Plan: COPD exacerbation. SIRS secondary to COPD exacerbation. Sepsis ruled out due to no source of infection. Mild myocardial injury secondary to COPD exacerbation. Condition had improved, currently he is off oxygen with good saturation.  Did not have any desaturation with walking.  We will continue oral prednisone taper.  Follow-up with PCP as outpatient  Essential  hypertension.  History of CVA  Resume home medicines  Polysubstance abuse. Advised to quit, prescribe nicotine patch.      Consultants: None Procedures performed: None  Disposition: Home Diet recommendation:  Discharge Diet Orders (From admission, onward)     Start     Ordered   11/30/21 0000  Diet - low sodium heart healthy        11/30/21 1015           Cardiac diet DISCHARGE MEDICATION: Allergies as of 11/30/2021   No Known Allergies      Medication List     TAKE these medications    albuterol (2.5 MG/3ML) 0.083% nebulizer solution Commonly known as: PROVENTIL Take 3 mLs (2.5 mg total) by nebulization every 6 (six) hours as needed for wheezing or shortness of breath.   albuterol 108 (90 Base) MCG/ACT inhaler Commonly known as: VENTOLIN HFA Inhale 2 puffs into the lungs every 6 (six) hours as needed for wheezing or shortness of breath.   ALPRAZolam 1 MG tablet Commonly known as: XANAX Take 1 tablet (1 mg total) by mouth 2 (two) times daily as needed.   aspirin EC 81 MG tablet Take 1 tablet (81 mg total) by mouth daily. Swallow whole.   atorvastatin 80 MG tablet Commonly known as: LIPITOR Take 1 tablet (80 mg total) by mouth at bedtime.   fluticasone furoate-vilanterol 200-25 MCG/ACT Aepb Commonly known as: Breo Ellipta Inhale 1 puff into the lungs daily.   gabapentin 400  MG capsule Commonly known as: NEURONTIN Take 400 mg by mouth in the morning and at bedtime. 400 mg twice a day as per patient --psychiatrist changed that Normally takes 400 mg daily   lisinopril 20 MG tablet Commonly known as: ZESTRIL Take 1 tablet (20 mg total) by mouth daily.   nicotine 21 mg/24hr patch Commonly known as: NICODERM CQ - dosed in mg/24 hours Place 1 patch (21 mg total) onto the skin daily. Start taking on: December 01, 2021   predniSONE 10 MG tablet Commonly known as: DELTASONE Take 4 tablets (40 mg total) by mouth daily for 3 days, THEN 2 tablets (20 mg  total) daily for 3 days, THEN 1 tablet (10 mg total) daily for 3 days. Start taking on: November 30, 2021   sildenafil 20 MG tablet Commonly known as: REVATIO Take 4-5 tablets (80-100 mg total) by mouth as needed (ED).   tamsulosin 0.4 MG Caps capsule Commonly known as: FLOMAX Take 1 capsule (0.4 mg total) by mouth in the morning and at bedtime.        Follow-up Information     Smitty Cords, DO Follow up in 1 week(s).   Specialty: Family Medicine Contact information: [address] [place] [address] [phone]                Discharge Exam: Filed Weights   11/29/21 0252 11/29/21 2143  Weight: 77.1 kg 78 kg   General exam: Appears calm and comfortable  Respiratory system: Decreased breath sounds. Respiratory effort normal. Cardiovascular system: S1 & S2 heard, RRR. No JVD, murmurs, rubs, gallops or clicks. No pedal edema. Gastrointestinal system: Abdomen is nondistended, soft and nontender. No organomegaly or masses felt. Normal bowel sounds heard. Central nervous system: Alert and oriented. No focal neurological deficits. Extremities: Symmetric 5 x 5 power. Skin: No rashes, lesions or ulcers Psychiatry: Judgement and insight appear normal. Mood & affect appropriate.    Condition at discharge: fair  The results of significant diagnostics from this hospitalization (including imaging, microbiology, ancillary and laboratory) are listed below for reference.   Imaging Studies: CT Angio Chest Pulmonary Embolism (PE) W or WO Contrast  Result Date: 11/29/2021 CLINICAL DATA:  Shortness of breath. EXAM: CT ANGIOGRAPHY CHEST WITH CONTRAST TECHNIQUE: Multidetector CT imaging of the chest was performed using the standard protocol during bolus administration of intravenous contrast. Multiplanar CT image reconstructions and MIPs were obtained to evaluate the vascular anatomy. RADIATION DOSE REDUCTION: This exam was performed according to the departmental  dose-optimization program which includes automated exposure control, adjustment of the mA and/or kV according to patient size and/or use of iterative reconstruction technique. CONTRAST:  71mL OMNIPAQUE IOHEXOL 350 MG/ML SOLN COMPARISON:  06/22/2017 FINDINGS: Cardiovascular: The heart size is normal. No substantial pericardial effusion. Coronary artery calcification is evident. Mild atherosclerotic calcification is noted in the wall of the thoracic aorta. There is no filling defect within the opacified pulmonary arteries to suggest the presence of an acute pulmonary embolus. Mediastinum/Nodes: Normal to upper normal mediastinal and bilateral hilar lymph nodes evident. The esophagus has normal imaging features. There is no axillary lymphadenopathy. Lungs/Pleura: Diffuse bronchial wall thickening is basilar predominant with centrilobular ground-glass opacity in the upper lungs bilaterally. Areas of peripheral airway impaction are noted in the dependent lower lobes bilaterally. No suspicious pulmonary nodule or mass. No focal airspace consolidation. No pleural effusion. Upper Abdomen: Unremarkable. Musculoskeletal: No worrisome lytic or sclerotic osseous abnormality. Advanced degenerative changes noted C7-T1 Review of the MIP images confirms  the above findings. IMPRESSION: 1. No CT evidence for acute pulmonary embolus. 2. Diffuse bronchial wall thickening with upper lung predominant centrilobular ground-glass opacity and areas of peripheral Fujimoto airway impaction in the dependent lower lobes. Imaging features are compatible with infectious/inflammatory etiology. 3. Coronary artery calcification. 4.  Aortic Atherosclerosis (ICD10-I70.0). Electronically Signed   By: Kennith Center M.D.   On: 11/29/2021 08:48   DG Chest 2 View  Result Date: 11/29/2021 CLINICAL DATA:  Shortness of breath EXAM: CHEST - 2 VIEW COMPARISON:  Two days ago FINDINGS: Normal heart size and mediastinal contours. Chronic interstitial coarsening.  No acute infiltrate or edema. No effusion or pneumothorax. No acute osseous findings. IMPRESSION: No active cardiopulmonary disease. Electronically Signed   By: Tiburcio Pea M.D.   On: 11/29/2021 03:44   DG Chest 2 View  Result Date: 11/27/2021 CLINICAL DATA:  Hypotension EXAM: CHEST - 2 VIEW COMPARISON:  Yesterday FINDINGS: Normal heart size and mediastinal contours. There is no edema, consolidation, effusion, or pneumothorax. Artifact from EKG leads. IMPRESSION: No active cardiopulmonary disease. Electronically Signed   By: Tiburcio Pea M.D.   On: 11/27/2021 09:03   DG Chest Portable 1 View  Result Date: 11/26/2021 CLINICAL DATA:  Altered mental status EXAM: PORTABLE CHEST 1 VIEW COMPARISON:  None Available. FINDINGS: The heart size and mediastinal contours are within normal limits. Both lungs are clear. The visualized skeletal structures are unremarkable. IMPRESSION: No active disease. Electronically Signed   By: Helyn Numbers M.D.   On: 11/26/2021 02:57   CT Head Wo Contrast  Result Date: 11/26/2021 CLINICAL DATA:  Altered mental status EXAM: CT HEAD WITHOUT CONTRAST TECHNIQUE: Contiguous axial images were obtained from the base of the skull through the vertex without intravenous contrast. RADIATION DOSE REDUCTION: This exam was performed according to the departmental dose-optimization program which includes automated exposure control, adjustment of the mA and/or kV according to patient size and/or use of iterative reconstruction technique. COMPARISON:  07/04/2020 FINDINGS: Brain: Large old right posterior temporal infarct with encephalomalacia, unchanged. No acute intracranial abnormality. Specifically, no hemorrhage, hydrocephalus, mass lesion, acute infarction, or significant intracranial injury. Vascular: No hyperdense vessel or unexpected calcification. Skull: No acute calvarial abnormality. Sinuses/Orbits: Mucosal thickening.  No acute findings. Other: None IMPRESSION: No acute  intracranial abnormality. Old right MCA infarct Electronically Signed   By: Charlett Nose M.D.   On: 11/26/2021 00:28    Microbiology: Results for orders placed or performed during the hospital encounter of 11/29/21  Resp Panel by RT-PCR (Flu A&B, Covid) Anterior Nasal Swab     Status: None   Collection Time: 11/29/21  2:55 AM   Specimen: Anterior Nasal Swab  Result Value Ref Range Status   SARS Coronavirus 2 by RT PCR NEGATIVE NEGATIVE Final    Comment: (NOTE) SARS-CoV-2 target nucleic acids are NOT DETECTED.  The SARS-CoV-2 RNA is generally detectable in upper respiratory specimens during the acute phase of infection. The lowest concentration of SARS-CoV-2 viral copies this assay can detect is 138 copies/mL. A negative result does not preclude SARS-Cov-2 infection and should not be used as the sole basis for treatment or other patient management decisions. A negative result may occur with  improper specimen collection/handling, submission of specimen other than nasopharyngeal swab, presence of viral mutation(s) within the areas targeted by this assay, and inadequate number of viral copies(<138 copies/mL). A negative result must be combined with clinical observations, patient history, and epidemiological information. The expected result is Negative.  Fact Sheet for Patients:  BloggerCourse.com  Fact Sheet for Healthcare Providers:  IncredibleEmployment.be  This test is no t yet approved or cleared by the [place] FDA and  has been authorized for detection and/or diagnosis of SARS-CoV-2 by FDA under an Emergency Use Authorization (EUA). This EUA will remain  in effect (meaning this test can be used) for the duration of the COVID-19 declaration under Section 564(b)(1) of the Act, 21 U.S.C.section 360bbb-3(b)(1), unless the authorization is terminated  or revoked sooner.       Influenza A by PCR NEGATIVE NEGATIVE Final   Influenza  B by PCR NEGATIVE NEGATIVE Final    Comment: (NOTE) The Xpert Xpress SARS-CoV-2/FLU/RSV plus assay is intended as an aid in the diagnosis of influenza from Nasopharyngeal swab specimens and should not be used as a sole basis for treatment. Nasal washings and aspirates are unacceptable for Xpert Xpress SARS-CoV-2/FLU/RSV testing.  Fact Sheet for Patients: EntrepreneurPulse.com.au  Fact Sheet for Healthcare Providers: IncredibleEmployment.be  This test is not yet approved or cleared by the [place] FDA and has been authorized for detection and/or diagnosis of SARS-CoV-2 by FDA under an Emergency Use Authorization (EUA). This EUA will remain in effect (meaning this test can be used) for the duration of the COVID-19 declaration under Section 564(b)(1) of the Act, 21 U.S.C. section 360bbb-3(b)(1), unless the authorization is terminated or revoked.  Performed at [facility], [address] [place], [place] [address]   Expectorated Sputum Assessment w Gram Stain, Rflx to Resp Cult     Status: None   Collection Time: 11/29/21 10:34 PM   Specimen: Sputum  Result Value Ref Range Status   Specimen Description SPUTUM  Final   Special Requests NONE  Final   Sputum evaluation   Final    THIS SPECIMEN IS ACCEPTABLE FOR SPUTUM CULTURE Performed at [facility], [address] [place], [place] [address]    Report Status 11/30/2021 FINAL  Final  Culture, Respiratory w Gram Stain     Status: None (Preliminary result)   Collection Time: 11/29/21 10:34 PM   Specimen: SPU  Result Value Ref Range Status   Specimen Description   Final    SPUTUM Performed at [facility], [address] [place], [place] [address]    Special Requests   Final    NONE Reflexed from [id number] Performed at [facility], [address] [place], [place] [address]    Gram Stain   Final    FEW WBC PRESENT, PREDOMINANTLY PMN FEW GRAM  POSITIVE COCCI IN PAIRS IN CHAINS RARE GRAM NEGATIVE RODS Performed at [facility], [place] [address], [place], [place] [address]    Culture PENDING  Incomplete   Report Status PENDING  Incomplete    Labs: CBC: Recent Labs  Lab 11/25/21 2342 11/27/21 0832 11/28/21 0607 11/29/21 0254 11/30/21 0416  WBC 15.6* 12.6* 10.2 14.4* 19.5*  NEUTROABS 11.7* 8.7*  --  11.0*  --   HGB 16.3 13.6 13.4 13.8 13.2  HCT 48.4 40.5 39.8 40.8 39.0  MCV 93.8 94.2 94.8 94.0 93.8  PLT 273 217 182 210 323   Basic Metabolic Panel: Recent Labs  Lab 11/25/21 2342 11/27/21 0832 11/28/21 0607 11/29/21 0254 11/30/21 0416  NA 135 137 136 137 137  K 3.6 3.2* 3.8 3.6 3.5  CL 97* 102 106 102 104  CO2 24 25 24 26 24   GLUCOSE 127* 118* 92 106* 191*  BUN 33* 42* 22 16 12   CREATININE 2.44* 1.65* 0.87 0.86 0.75  CALCIUM 9.7 8.6* 8.4* 8.7* 8.9  MG  --  1.9  --   --   --   PHOS  --  4.3  --   --   --    Liver Function Tests: Recent Labs  Lab 11/25/21 2342 11/27/21 0832 11/29/21 0254  AST 20 19 20   ALT 17 14 14   ALKPHOS 143* 121 119  BILITOT 0.9 0.7 0.9  PROT 7.4 6.7 6.9  ALBUMIN 4.4 3.9 3.7   CBG: Recent Labs  Lab 11/25/21 2336  GLUCAP 136*    Discharge time spent: greater than 30 minutes.  Signed: , MD [facility] 11/30/2021

## 2021-12-01 ENCOUNTER — Ambulatory Visit: Payer: Self-pay | Admitting: *Deleted

## 2021-12-01 LAB — CULTURE, BLOOD (ROUTINE X 2)
Culture: NO GROWTH
Culture: NO GROWTH
Special Requests: ADEQUATE
Special Requests: ADEQUATE
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
## 2021-12-02 LAB — CULTURE, BLOOD (ROUTINE X 2)
Culture: NO GROWTH
Culture: NO GROWTH
Special Requests: ADEQUATE

## 2021-12-02 LAB — CULTURE, RESPIRATORY W GRAM STAIN: Culture: NORMAL

## 2021-12-02 NOTE — Telephone Encounter (Signed)
Tried calling back around 11am and just a few mins ago. No answer.

## 2021-12-07 ENCOUNTER — Inpatient Hospital Stay: Payer: Medicare Other | Admitting: Family Medicine

## 2021-12-09 ENCOUNTER — Ambulatory Visit
Payer: Medicare Other | Attending: Student in an Organized Health Care Education/Training Program | Admitting: Student in an Organized Health Care Education/Training Program

## 2021-12-09 ENCOUNTER — Encounter: Payer: Self-pay | Admitting: Student in an Organized Health Care Education/Training Program

## 2021-12-09 ENCOUNTER — Ambulatory Visit
Admission: RE | Admit: 2021-12-09 | Discharge: 2021-12-09 | Disposition: A | Discharge status: Home / Self Care | Payer: Medicare Other | Source: Ambulatory Visit | Attending: Student in an Organized Health Care Education/Training Program | Admitting: Student in an Organized Health Care Education/Training Program

## 2021-12-09 DIAGNOSIS — G894 Chronic pain syndrome: Secondary | ICD-10-CM | POA: Diagnosis not present

## 2021-12-09 DIAGNOSIS — M47816 Spondylosis without myelopathy or radiculopathy, lumbar region: Secondary | ICD-10-CM | POA: Diagnosis not present

## 2021-12-09 MED ORDER — DEXAMETHASONE SODIUM PHOSPHATE 10 MG/ML IJ SOLN
10.0000 mg | Freq: Once | INTRAMUSCULAR | Status: AC
  Administered 2021-12-09: 10 mg

## 2021-12-09 MED ORDER — LACTATED RINGERS IV SOLN: Freq: Once | INTRAVENOUS | Status: AC

## 2021-12-09 MED ORDER — ROPIVACAINE HCL 2 MG/ML IJ SOLN
9.0000 mL | Freq: Once | INTRAMUSCULAR | Status: AC
  Administered 2021-12-09: 9 mL via PERINEURAL

## 2021-12-09 MED ORDER — LIDOCAINE HCL 2 % IJ SOLN
20.0000 mL | Freq: Once | INTRAMUSCULAR | Status: AC
  Administered 2021-12-09: 400 mg

## 2021-12-09 MED ORDER — ROPIVACAINE HCL 2 MG/ML IJ SOLN
INTRAMUSCULAR | Status: AC
  Filled 2021-12-09: qty 20

## 2021-12-09 MED ORDER — MIDAZOLAM HCL 5 MG/5ML IJ SOLN
INTRAMUSCULAR | Status: AC
  Filled 2021-12-09: qty 5

## 2021-12-09 MED ORDER — MIDAZOLAM HCL 2 MG/2ML IJ SOLN
0.5000 mg | Freq: Once | INTRAMUSCULAR | Status: AC
  Administered 2021-12-09: 2 mg via INTRAVENOUS

## 2021-12-09 MED ORDER — DEXAMETHASONE SODIUM PHOSPHATE 10 MG/ML IJ SOLN
INTRAMUSCULAR | Status: AC
  Filled 2021-12-09: qty 1

## 2021-12-09 MED ORDER — LIDOCAINE HCL 2 % IJ SOLN
INTRAMUSCULAR | Status: AC
  Filled 2021-12-09: qty 20

## 2021-12-09 MED ORDER — FENTANYL CITRATE (PF) 100 MCG/2ML IJ SOLN
25.0000 ug | INTRAMUSCULAR | Status: DC | PRN
  Administered 2021-12-09: 100 ug via INTRAVENOUS

## 2021-12-09 MED ORDER — FENTANYL CITRATE (PF) 100 MCG/2ML IJ SOLN
INTRAMUSCULAR | Status: AC
  Filled 2021-12-09: qty 2

## 2021-12-09 NOTE — Progress Notes (Signed)
Patient's Name: Ryan [name]  MRN: [id number]  Referring Provider: Edward JollyLateef, Mallori Araque, MD  DOB: 1955-08-15  PCP: Smitty CordsKaramalegos, Alexander J, DO  DOS: 12/09/2021  Note by: Edward JollyBilal Miracle Criado, MD  Service setting: Ambulatory outpatient  Specialty: Interventional Pain Management  Patient type: Established  Location: [facility] (AMB) Pain Management Facility  Visit type: Interventional Procedure   Primary Reason for Visit: Interventional Pain Management Treatment. CC: Back Pain (lower)   Procedure:          Anesthesia, Analgesia, Anxiolysis:  Type: Lumbar Facet, Medial Branch Block(s) #3,  2023 Primary Purpose: Therapeutic Region: Posterolateral Lumbosacral Spine Level: L3, L4, L5,  Medial Branch Level(s). Injecting these levels blocks the L3-4, L4-5 lumbar facet joints. Laterality: Bilateral  Type: moderate sedation Route: Intravenous (IV) IV Access: Secured Sedation: Meaningful verbal contact was maintained at all times during the procedure  Local Anesthetic: Lidocaine 1-2%  Position: Prone   Indications: 1. Lumbar spondylosis   2. Lumbar facet arthropathy   3. Chronic pain syndrome    Pain Score: Pre-procedure: 6 /10 Post-procedure: 3 /10  Pre-op Assessment:  Ryan [name] is a 66 y.o. (year old), male patient, seen today for interventional treatment. He  has a past surgical history that includes Eye surgery; Hernia repair; Transforaminal lumbar interbody fusion (tlif) with pedicle screw fixation 1 level (Right, 06/18/2014); Myringotomy with tube placement (Bilateral, 03/28/2015); Back surgery; Esophagogastroduodenoscopy (egd) with propofol (N/A, 10/24/2015); Cataract extraction w/PHACO (Left, 01/06/2016); Tonsillectomy; Colonoscopy with propofol (N/A, 04/19/2016); and Sinusotomy (02/28/2019). Ryan [name] has a current medication list which includes the following prescription(s): albuterol, albuterol, alprazolam, aspirin ec, atorvastatin, fluticasone furoate-vilanterol, gabapentin, lisinopril, nicotine,  prednisone, sildenafil, and tamsulosin, and the following Facility-Administered Medications: fentanyl. His primarily concern today is the Back Pain (lower)   Initial Vital Signs:  Pulse/HCG Rate: 82ECG Heart Rate: 72 (NSR) Temp: (!) 97.5 F (36.4 C) Resp: 18 BP: (!) 157/95 SpO2: 99 %  BMI: Estimated body mass index is 26.63 kg/m as calculated from the following:   Height as of this encounter: 5\' 7"  (1.702 m).   Weight as of this encounter: 170 lb (77.1 kg).  Risk Assessment: Allergies: Reviewed. He has No Known Allergies.  Allergy Precautions: None required Coagulopathies: Reviewed. None identified.  Blood-thinner therapy: None at this time Active Infection(s): Reviewed. None identified. Ryan [name] is afebrile  Site Confirmation: Ryan [name] was asked to confirm the procedure and laterality before marking the site Procedure checklist: Completed Consent: Before the procedure and under the influence of no sedative(s), amnesic(s), or anxiolytics, the patient was informed of the treatment options, risks and possible complications. To fulfill our ethical and legal obligations, as recommended by the American Medical Association's Code of Ethics, I have informed the patient of my clinical impression; the nature and purpose of the treatment or procedure; the risks, benefits, and possible complications of the intervention; the alternatives, including doing nothing; the risk(s) and benefit(s) of the alternative treatment(s) or procedure(s); and the risk(s) and benefit(s) of doing nothing. The patient was provided information about the general risks and possible complications associated with the procedure. These may include, but are not limited to: failure to achieve desired goals, infection, bleeding, organ or nerve damage, allergic reactions, paralysis, and death. In addition, the patient was informed of those risks and complications associated to Spine-related procedures, such as failure to decrease  pain; infection (i.e.: Meningitis, epidural or intraspinal abscess); bleeding (i.e.: epidural hematoma, subarachnoid hemorrhage, or any other type of intraspinal or peri-dural bleeding); organ or nerve damage (i.e.: Any  type of peripheral nerve, nerve root, or spinal cord injury) with subsequent damage to sensory, motor, and/or autonomic systems, resulting in permanent pain, numbness, and/or weakness of one or several areas of the body; allergic reactions; (i.e.: anaphylactic reaction); and/or death. Furthermore, the patient was informed of those risks and complications associated with the medications. These include, but are not limited to: allergic reactions (i.e.: anaphylactic or anaphylactoid reaction(s)); adrenal axis suppression; blood sugar elevation that in diabetics may result in ketoacidosis or comma; water retention that in patients with history of congestive heart failure may result in shortness of breath, pulmonary edema, and decompensation with resultant heart failure; weight gain; swelling or edema; medication-induced neural toxicity; particulate matter embolism and blood vessel occlusion with resultant organ, and/or nervous system infarction; and/or aseptic necrosis of one or more joints. Finally, the patient was informed that Medicine is not an exact science; therefore, there is also the possibility of unforeseen or unpredictable risks and/or possible complications that may result in a catastrophic outcome. The patient indicated having understood very clearly. We have given the patient no guarantees and we have made no promises. Enough time was given to the patient to ask questions, all of which were answered to the patient's satisfaction. Mr. Schellenberg has indicated that he wanted to continue with the procedure. Attestation: I, the ordering provider, attest that I have discussed with the patient the benefits, risks, side-effects, alternatives, likelihood of achieving goals, and potential problems  during recovery for the procedure that I have provided informed consent. Date  Time: 12/09/2021  8:31 AM  Pre-Procedure Preparation:  Monitoring: As per clinic protocol. Respiration, ETCO2, SpO2, BP, heart rate and rhythm monitor placed and checked for adequate function Safety Precautions: Patient was assessed for positional comfort and pressure points before starting the procedure. Time-out: I initiated and conducted the "Time-out" before starting the procedure, as per protocol. The patient was asked to participate by confirming the accuracy of the "Time Out" information. Verification of the correct person, site, and procedure were performed and confirmed by me, the nursing staff, and the patient. "Time-out" conducted as per Joint Commission's Universal Protocol (UP.01.01.01). Time: 0928  Description of Procedure:          Laterality: Bilateral. The procedure was performed in identical fashion on both sides. Levels:  L3, L4, L5,  Medial Branch Level(s) Area Prepped: Posterior Lumbosacral Region Prepping solution: ChloraPrep (2% chlorhexidine gluconate and 70% isopropyl alcohol) Safety Precautions: Aspiration looking for blood return was conducted prior to all injections. At no point did we inject any substances, as a needle was being advanced. Before injecting, the patient was told to immediately notify me if he was experiencing any new onset of "ringing in the ears, or metallic taste in the mouth". No attempts were made at seeking any paresthesias. Safe injection practices and needle disposal techniques used. Medications properly checked for expiration dates. SDV (single dose vial) medications used. After the completion of the procedure, all disposable equipment used was discarded in the proper designated medical waste containers. Local Anesthesia: Protocol guidelines were followed. The patient was positioned over the fluoroscopy table. The area was prepped in the usual manner. The time-out was  completed. The target area was identified using fluoroscopy. A 12-in long, straight, sterile hemostat was used with fluoroscopic guidance to locate the targets for each level blocked. Once located, the skin was marked with an approved surgical skin marker. Once all sites were marked, the skin (epidermis, dermis, and hypodermis), as well as deeper tissues (fat, connective  tissue and muscle) were infiltrated with a Cauthorn amount of a short-acting local anesthetic, loaded on a 10cc syringe with a 25G, 1.5-in  Needle. An appropriate amount of time was allowed for local anesthetics to take effect before proceeding to the next step. Local Anesthetic: Lidocaine 2.0% The unused portion of the local anesthetic was discarded in the proper designated containers. Technical explanation of process:   L3 Medial Branch Nerve Block (MBB): The target area for the L3 medial branch is at the junction of the postero-lateral aspect of the superior articular process and the superior, posterior, and medial edge of the transverse process of L4. Under fluoroscopic guidance, a Quincke needle was inserted until contact was made with os over the superior postero-lateral aspect of the pedicular shadow (target area). After negative aspiration for blood,1 cc  mL of the nerve block solution was injected without difficulty or complication. The needle was removed intact. L4 Medial Branch Nerve Block (MBB): The target area for the L4 medial branch is at the junction of the postero-lateral aspect of the superior articular process and the superior, posterior, and medial edge of the transverse process of L5. Under fluoroscopic guidance, a Quincke needle was inserted until contact was made with os over the superior postero-lateral aspect of the pedicular shadow (target area). After negative aspiration for blood,1cc mL of the nerve block solution was injected without difficulty or complication. The needle was removed intact. L5 Medial Branch Nerve  Block (MBB): The target area for the L5 medial branch is at the junction of the postero-lateral aspect of the superior articular process and the superior, posterior, and medial edge of the sacral ala. Under fluoroscopic guidance, a Quincke needle was inserted until contact was made with os over the superior postero-lateral aspect of the pedicular shadow (target area). After negative aspiration for blood,1cc  mL of the nerve block solution was injected without difficulty or complication. The needle was removed intact.   Nerve block solution: 10 cc solution made of 8 cc of 0.2% ropivacaine, 2 cc of Decadron 10 mg/cc.  1-1.5 cc injected at each level above bilaterally  Procedural Needles: 22-gauge, 3.5-inch, Quincke needles used for all levels.  Once the entire procedure was completed, the treated area was cleaned, making sure to leave some of the prepping solution back to take advantage of its long term bactericidal properties.   Illustration of the posterior view of the lumbar spine and the posterior neural structures. Laminae of L2 through S1 are labeled. DPRL5, dorsal primary ramus of L5; DPRS1, dorsal primary ramus of S1; DPR3, dorsal primary ramus of L3; FJ, facet (zygapophyseal) joint L3-L4; I, inferior articular process of L4; LB1, lateral branch of dorsal primary ramus of L1; IAB, inferior articular branches from L3 medial branch (supplies L4-L5 facet joint); IBP, intermediate branch plexus; MB3, medial branch of dorsal primary ramus of L3; NR3, third lumbar nerve root; S, superior articular process of L5; SAB, superior articular branches from L4 (supplies L4-5 facet joint also); TP3, transverse process of L3.  Vitals:   12/09/21 0939 12/09/21 0948 12/09/21 0958 12/09/21 1008  BP: (!) 144/91 (!) 154/84 (!) 152/79 133/79  Pulse:      Resp: 18 16 16 16   Temp:  (!) 97.2 F (36.2 C)    TempSrc:      SpO2: 100% 100% 100% 96%  Weight:      Height:         Start Time: 0928 hrs. End Time:  0938 hrs.  Imaging Guidance (Spinal):  Type of Imaging Technique: Fluoroscopy Guidance (Spinal) Indication(s): Assistance in needle guidance and placement for procedures requiring needle placement in or near specific anatomical locations not easily accessible without such assistance. Exposure Time: Please see nurses notes. Contrast: None used. Fluoroscopic Guidance: I was personally present during the use of fluoroscopy. "Tunnel Vision Technique" used to obtain the best possible view of the target area. Parallax error corrected before commencing the procedure. "Direction-depth-direction" technique used to introduce the needle under continuous pulsed fluoroscopy. Once target was reached, antero-posterior, oblique, and lateral fluoroscopic projection used confirm needle placement in all planes. Images permanently stored in EMR. Interpretation: No contrast injected. I personally interpreted the imaging intraoperatively. Adequate needle placement confirmed in multiple planes. Permanent images saved into the patient's record.   Post-operative Assessment:  Post-procedure Vital Signs:  Pulse/HCG Rate: 8274 Temp: (!) 97.2 F (36.2 C) Resp: 16 BP: 133/79 SpO2: 96 %  EBL: None  Complications: No immediate post-treatment complications observed by team, or reported by patient.  Note: The patient tolerated the entire procedure well. A repeat set of vitals were taken after the procedure and the patient was kept under observation following institutional policy, for this type of procedure. Post-procedural neurological assessment was performed, showing return to baseline, prior to discharge. The patient was provided with post-procedure discharge instructions, including a section on how to identify potential problems. Should any problems arise concerning this procedure, the patient was given instructions to immediately contact us, at any time, without hesitation. In any case, we plan to contact the  patient by telephone for a follow-up status report regarding this interventional procedure.  Comments:  No additional relevant information.  Plan of Care   Imaging Orders         DG PAIN CLINIC C-ARM 1-60 MIN NO REPORT     Medications ordered for procedure: Meds ordered this encounter  Medications   lidocaine (XYLOCAINE) 2 % (with pres) injection 400 mg   lactated ringers infusion   midazolam (VERSED) injection 0.5-2 mg    Make sure Flumazenil is available in the pyxis when using this medication. If oversedation occurs, administer 0.2 mg IV over 15 sec. If after 45 sec no response, administer 0.2 mg again over 1 min; may repeat at 1 min intervals; not to exceed 4 doses (1 mg)   fentaNYL (SUBLIMAZE) injection 25-50 mcg    Make sure Narcan is available in the pyxis when using this medication. In the event of respiratory depression (RR< 8/min): Titrate NARCAN (naloxone) in increments of 0.1 to 0.2 mg IV at 2-3 minute intervals, until desired degree of reversal.   dexamethasone (DECADRON) injection 10 mg   dexamethasone (DECADRON) injection 10 mg   ropivacaine (PF) 2 mg/mL (0.2%) (NAROPIN) injection 9 mL   Medications administered: We administered lidocaine, lactated ringers, midazolam, fentaNYL, dexamethasone, dexamethasone, and ropivacaine (PF) 2 mg/mL (0.2%).  See the medical record for exact dosing, route, and time of administration.  Disposition: Discharge home  Discharge Date & Time: 12/09/2021; 1010 hrs.   Physician-requested Follow-up: No follow-ups on file.  Future Appointments  Date Time Provider Department Center  01/27/2022  3:00 PM Edward Jolly, MD [facility] None  05/21/2022  1:40 PM Althea Charon Netta Neat, DO [facility]    Primary Care Physician: Smitty Cords, DO Location: [facility] Note by: Edward Jolly, MD Date: 12/09/2021; Time: 10:46 AM  Disclaimer:  Medicine is not an exact science. The only guarantee in medicine  is that nothing is guaranteed. It is important  to note that the decision to proceed with this intervention was based on the information collected from the patient. The Data and conclusions were drawn from the patient's questionnaire, the interview, and the physical examination. Because the information was provided in large part by the patient, it cannot be guaranteed that it has not been purposely or unconsciously manipulated. Every effort has been made to obtain as much relevant data as possible for this evaluation. It is important to note that the conclusions that lead to this procedure are derived in large part from the available data. Always take into account that the treatment will also be dependent on availability of resources and existing treatment guidelines, considered by other Pain Management Practitioners as being common knowledge and practice, at the time of the intervention. For Medico-Legal purposes, it is also important to point out that variation in procedural techniques and pharmacological choices are the acceptable norm. The indications, contraindications, technique, and results of the above procedure should only be interpreted and judged by a Board-Certified Interventional Pain Specialist with extensive familiarity and expertise in the same exact procedure and technique.

## 2021-12-09 NOTE — Progress Notes (Signed)
Safety precautions to be maintained throughout the outpatient stay will include: orient to surroundings, keep bed in low position, maintain call bell within reach at all times, provide assistance with transfer out of bed and ambulation.  

## 2021-12-09 NOTE — Patient Instructions (Signed)

## 2021-12-10 ENCOUNTER — Telehealth: Payer: Self-pay | Admitting: *Deleted

## 2021-12-10 NOTE — Telephone Encounter (Signed)
Attempted to call for post procedure follow-up. Message left. 

## 2021-12-23 DIAGNOSIS — J45909 Unspecified asthma, uncomplicated: Secondary | ICD-10-CM | POA: Diagnosis not present

## 2021-12-23 DIAGNOSIS — Z79899 Other long term (current) drug therapy: Secondary | ICD-10-CM | POA: Diagnosis not present

## 2021-12-23 DIAGNOSIS — R39198 Other difficulties with micturition: Secondary | ICD-10-CM | POA: Diagnosis not present

## 2021-12-23 DIAGNOSIS — Z7982 Long term (current) use of aspirin: Secondary | ICD-10-CM | POA: Diagnosis not present

## 2021-12-23 DIAGNOSIS — Z7289 Other problems related to lifestyle: Secondary | ICD-10-CM | POA: Diagnosis not present

## 2021-12-23 DIAGNOSIS — R3915 Urgency of urination: Secondary | ICD-10-CM | POA: Diagnosis not present

## 2021-12-23 DIAGNOSIS — Z888 Allergy status to other drugs, medicaments and biological substances status: Secondary | ICD-10-CM | POA: Diagnosis not present

## 2021-12-23 DIAGNOSIS — R3916 Straining to void: Secondary | ICD-10-CM | POA: Diagnosis not present

## 2021-12-23 DIAGNOSIS — R3912 Poor urinary stream: Secondary | ICD-10-CM | POA: Diagnosis not present

## 2021-12-23 DIAGNOSIS — R339 Retention of urine, unspecified: Secondary | ICD-10-CM | POA: Diagnosis not present

## 2021-12-23 DIAGNOSIS — I10 Essential (primary) hypertension: Secondary | ICD-10-CM | POA: Diagnosis not present

## 2021-12-23 DIAGNOSIS — F17210 Nicotine dependence, cigarettes, uncomplicated: Secondary | ICD-10-CM | POA: Diagnosis not present

## 2021-12-23 DIAGNOSIS — R350 Frequency of micturition: Secondary | ICD-10-CM | POA: Diagnosis not present

## 2022-01-01 ENCOUNTER — Telehealth: Payer: Self-pay | Admitting: Family Medicine

## 2022-01-01 NOTE — Telephone Encounter (Signed)
Left message for patient to call back and schedule the Medicare Annual Wellness Visit (AWV) virtually or by telephone.  Last AWV 01/28/21  Please schedule at anytime with [facility] Advisor.   Any questions, please call me at [phone]

## 2022-01-26 ENCOUNTER — Telehealth: Payer: Self-pay

## 2022-01-27 ENCOUNTER — Encounter: Payer: Self-pay | Admitting: Student in an Organized Health Care Education/Training Program

## 2022-01-27 ENCOUNTER — Ambulatory Visit
Payer: Medicare Other | Attending: Student in an Organized Health Care Education/Training Program | Admitting: Student in an Organized Health Care Education/Training Program

## 2022-01-27 DIAGNOSIS — M5134 Other intervertebral disc degeneration, thoracic region: Secondary | ICD-10-CM

## 2022-01-27 DIAGNOSIS — G894 Chronic pain syndrome: Secondary | ICD-10-CM

## 2022-01-27 DIAGNOSIS — M48062 Spinal stenosis, lumbar region with neurogenic claudication: Secondary | ICD-10-CM

## 2022-01-27 DIAGNOSIS — M47816 Spondylosis without myelopathy or radiculopathy, lumbar region: Secondary | ICD-10-CM

## 2022-01-27 DIAGNOSIS — M961 Postlaminectomy syndrome, not elsewhere classified: Secondary | ICD-10-CM | POA: Diagnosis not present

## 2022-01-27 NOTE — Progress Notes (Signed)
Patient: Ryan [name]  Service Category: E/M  Provider: [name], MD  DOB: [birth year]  DOS: 01/27/2022  Location: Office  MRN: [id number]  Setting: Ambulatory outpatient  Referring Provider: Nobie Putnam *  Type: Established Patient  Specialty: Interventional Pain Management  PCP: Olin Hauser, DO  Location: Remote location  Delivery: TeleHealth     Virtual Encounter - Pain Management PROVIDER NOTE: Information contained herein reflects review and annotations entered in association with encounter. Interpretation of such information and data should be left to medically-trained personnel. Information provided to patient can be located elsewhere in the medical record under "Patient Instructions". Document created using STT-dictation technology, any transcriptional errors that may result from process are unintentional.    Contact & Pharmacy Preferred: [phone] Home: [phone] (home) Mobile: There is no such number on file (mobile). E-mail: [facility] [address]), [place] [address] [place]) [place] [address] Phone: [phone] Fax: [phone]   Pre-screening  Mr. Knope offered "in-person" vs "virtual" encounter. He indicated preferring virtual for this encounter.   Reason COVID-19*  Social distancing based on CDC and AMA recommendations.   I contacted SHAQUON GROPP on 01/27/2022 via telephone.      I clearly identified myself as [name], MD. I verified that I was speaking with the correct person using two identifiers (Name: DAVAUGHN HILLYARD, and date of birth: 03/26/1955).  Consent I sought verbal advanced consent from Ryan [name] for virtual visit interactions. I informed Ryan [name] of possible security and privacy concerns, risks, and limitations associated with providing "not-in-person" medical evaluation and management services. I also informed Ryan [name] of the availability of  "in-person" appointments. Finally, I informed him that there would be a charge for the virtual visit and that he could be  personally, fully or partially, financially responsible for it. Ryan [name] expressed understanding and agreed to proceed.   Historic Elements   Ryan [name] is a 67 y.o. year old, male patient evaluated today after our last contact on 12/09/2021. Ryan [name]  has a past medical history of Anxiety, Arthritis, Asthma, Chronic kidney disease, COPD (chronic obstructive pulmonary disease) ([place]), Dyspnea, GERD (gastroesophageal reflux disease), Hyperlipidemia, Hypertension, Lower urinary tract symptoms (LUTS), Prostate disease, Stroke [facility] Thyroid disease, Wears dentures, and Wears hearing aid. He also  has a past surgical history that includes Eye surgery; Hernia repair; Transforaminal lumbar interbody fusion (tlif) with pedicle screw fixation 1 level (Right, 06/18/2014); Myringotomy with tube placement (Bilateral, 03/28/2015); Back surgery; Esophagogastroduodenoscopy (egd) with propofol (N/A, 10/24/2015); Cataract extraction w/PHACO (Left, 01/06/2016); Tonsillectomy; Colonoscopy with propofol (N/A, 04/19/2016); and Sinusotomy (02/28/2019). Ryan [name] has a current medication list which includes the following prescription(s): albuterol, albuterol, alprazolam, aspirin ec, atorvastatin, fluticasone furoate-vilanterol, gabapentin, lisinopril, nicotine, sildenafil, and tamsulosin. He  reports that he has been smoking cigarettes. He has a 60.00 pack-year smoking history. He uses smokeless tobacco. He reports current alcohol use. He reports current drug use. Drugs: Marijuana and "Crack" cocaine. Ryan [name] has No Known Allergies.  Estimated body mass index is 26.63 kg/m as calculated from the following:   Height as of 12/09/21: _1  (1.702 m).   Weight as of 12/09/21: 170 lb (77.1 kg).  HPI  Today, he is being contacted for a post-procedure assessment.   Post-procedure evaluation  Type: Lumbar  Facet, Medial Branch Block(s) #3,  2023 Primary Purpose: Therapeutic Region: Posterolateral Lumbosacral Spine Level: L3, L4, L5,  Medial Branch Level(s). Injecting  these levels blocks the L3-4, L4-5 lumbar facet joints. Laterality: Bilateral  Effectiveness:  Initial hour after procedure: 100 %  Subsequent 4-6 hours post-procedure: 100 %  Analgesia past initial 6 hours: 100 % (lasting a few weeks)  Ongoing improvement:  Analgesic:  50% however pain is returning Function: Back to baseline ROM: Somewhat improved   Laboratory Chemistry Profile   Renal Lab Results  Component Value Date   BUN 12 11/30/2021   CREATININE 0.75 11/30/2021   BCR SEE NOTE: 11/18/2021   GFRAA >60 09/11/2019   GFRNONAA >60 11/30/2021    Hepatic Lab Results  Component Value Date   AST 20 11/29/2021   ALT 14 11/29/2021   ALBUMIN 3.7 11/29/2021   ALKPHOS 119 11/29/2021   LIPASE 36 11/29/2021    Electrolytes Lab Results  Component Value Date   NA 137 11/30/2021   K 3.5 11/30/2021   CL 104 11/30/2021   CALCIUM 8.9 11/30/2021   MG 1.9 11/27/2021   PHOS 4.3 11/27/2021    Bone Lab Results  Component Value Date   TESTOSTERONE 473 02/02/2021    Inflammation (CRP: Acute Phase) (ESR: Chronic Phase) Lab Results  Component Value Date   LATICACIDVEN 2.7 ([place]ise) 11/29/2021         Note: Above Lab results reviewed.   Assessment  The primary encounter diagnosis was Lumbar spondylosis. Diagnoses of Lumbar facet arthropathy, Failed back surgical syndrome, Spinal stenosis, lumbar region, with neurogenic claudication, Chronic pain syndrome, and Other intervertebral disc degeneration, thoracic region were also pertinent to this visit.  Plan of Care  Repeat lumbar facet medial branch nerve blocks Discuss SCS-pt will need T+ L MRI which I will order now to evaluate for disease progression (I.e. adjacent segment disease) and SCS trial planning  Orders:  Orders Placed This Encounter  Procedures   LUMBAR  FACET(MEDIAL BRANCH NERVE BLOCK) MBNB    Standing Status:   Future    Standing Expiration Date:   04/28/2022    Scheduling Instructions:     Procedure: Lumbar facet block (AKA.: Lumbosacral medial branch nerve block)     Side: Bilateral     Level: L3-4 & L5-S1 Facets ( L3, L4, L5,  Medial Branch Nerves)     Sedation: ECT     Timeframe: ASAA    Order Specific Question:   Where will this procedure be performed?    Answer:   [facility]   MR LUMBAR SPINE WO CONTRAST    Patient presents with axial pain with possible radicular component. Please assist [place] in identifying specific level(s) and laterality of any additional findings such as: 1. Facet (Zygapophyseal) joint DJD (Hypertrophy, space narrowing, subchondral sclerosis, and/or osteophyte formation) 2. DDD and/or IVDD (Loss of disc height, desiccation, gas patterns, osteophytes, endplate sclerosis, or "Black disc disease") 3. Pars defects 4. Spondylolisthesis, spondylosis, and/or spondyloarthropathies (include Degree/Grade of displacement in mm) (stability) 5. Vertebral body Fractures (acute/chronic) (state percentage of collapse) 6. Demineralization (osteopenia/osteoporotic) 7. Bone pathology 8. Foraminal narrowing  9. Surgical changes 10. Central, Lateral Recess, and/or Foraminal Stenosis (include AP diameter of stenosis in mm) 11. Surgical changes (hardware type, status, and presence of fibrosis) 12. Modic Type Changes (MRI only) 13. IVDD (Disc bulge, protrusion, herniation, extrusion) (Level, laterality, extent)    Standing Status:   Future    Standing Expiration Date:   02/27/2022    Scheduling Instructions:     Imaging must be done as soon as possible. Inform patient that order will expire within 30 days and  I will not renew it.    Order Specific Question:   What is the patient's sedation requirement?    Answer:   No Sedation    Order Specific Question:   Does the patient have a pacemaker or implanted devices?    Answer:    No    Order Specific Question:   Preferred imaging location?    Answer:   [facility] (table limit-350lbs)    Order Specific Question:   Call Results- Best Contact Number?    Answer:   [phone] ([facility])    Order Specific Question:   Radiology Contrast Protocol - do NOT remove file path    Answer:   \\[email or web address]   MR THORACIC SPINE WO CONTRAST    Patient presents with axial pain with possible radicular component. Please assist [place] in identifying specific level(s) and laterality of any additional findings such as: 1. Facet (Zygapophyseal) joint DJD (Hypertrophy, space narrowing, subchondral sclerosis, and/or osteophyte formation) 2. DDD and/or IVDD (Loss of disc height, desiccation, gas patterns, osteophytes, endplate sclerosis, or "Black disc disease") 3. Pars defects 4. Spondylolisthesis, spondylosis, and/or spondyloarthropathies (include Degree/Grade of displacement in mm) (stability) 5. Vertebral body Fractures (acute/chronic) (state percentage of collapse) 6. Demineralization (osteopenia/osteoporotic) 7. Bone pathology 8. Foraminal narrowing  9. Surgical changes 10. Central, Lateral Recess, and/or Foraminal Stenosis (include AP diameter of stenosis in mm) 11. Surgical changes (hardware type, status, and presence of fibrosis) 12. Modic Type Changes (MRI only) 13. IVDD (Disc bulge, protrusion, herniation, extrusion) (Level, laterality, extent)    Standing Status:   Future    Standing Expiration Date:   02/27/2022    Scheduling Instructions:     Imaging must be done as soon as possible. Inform patient that order will expire within 30 days and I will not renew it.    Order Specific Question:   What is the patient's sedation requirement?    Answer:   No Sedation    Order Specific Question:   Does the patient have a pacemaker or implanted devices?    Answer:   No    Order Specific Question:   Preferred imaging location?    Answer:    [facility] (table limit-350lbs)    Order Specific Question:   Call Results- Best Contact Number?    Answer:   [phone] ([facility])    Order Specific Question:   Radiology Contrast Protocol - do NOT remove file path    Answer:   \\[email or web address]   Follow-up plan:   Return in about 4 weeks (around 02/24/2022) for B/L L3, 4, 5 MBNB, ECT & Review T+L MRI & SCS trial (block 60 mins).     Status post bilateral L3, L4, L5, S1 lumbar facet medial branch nerve blocks on 03/20/2018 helped 85% pain relief for approximately 1 year. Status post left L3, L4, L5 RFA on 07/11/2019, Right L3, L4, L5 RFA 07/25/19. Not as helpful as lumbar facet MBBs- repeat PRN, consider SPRINT PNS                Recent Visits Date Type Provider Dept  12/09/21 Procedure visit [name], MD [facility]  11/11/21 Office Visit [name], MD [facility]  Showing recent visits within past 90 days and meeting all other requirements Today's Visits Date Type Provider Dept  01/27/22 Office Visit [name], MD [facility]  Showing today's visits and meeting all other requirements Future Appointments No visits were found meeting these conditions. Showing future  appointments within next 90 days and meeting all other requirements  I discussed the assessment and treatment plan with the patient. The patient was provided an opportunity to ask questions and all were answered. The patient agreed with the plan and demonstrated an understanding of the instructions.  Patient advised to call back or seek an in-person evaluation if the symptoms or condition worsens.  Duration of encounter: 5mnutes.  Note by: BGillis Santa MD Date: 01/27/2022; Time: 2:02 PM

## 2022-02-04 ENCOUNTER — Encounter: Payer: Self-pay | Admitting: Family Medicine

## 2022-02-04 ENCOUNTER — Ambulatory Visit (INDEPENDENT_AMBULATORY_CARE_PROVIDER_SITE_OTHER): Payer: 59 | Admitting: Family Medicine

## 2022-02-04 VITALS — BP 132/80 | HR 62 | Ht 67.0 in | Wt 169.8 lb

## 2022-02-04 DIAGNOSIS — M47816 Spondylosis without myelopathy or radiculopathy, lumbar region: Secondary | ICD-10-CM

## 2022-02-04 DIAGNOSIS — Z9889 Other specified postprocedural states: Secondary | ICD-10-CM | POA: Diagnosis not present

## 2022-02-04 DIAGNOSIS — M5416 Radiculopathy, lumbar region: Secondary | ICD-10-CM | POA: Diagnosis not present

## 2022-02-04 DIAGNOSIS — L858 Other specified epidermal thickening: Secondary | ICD-10-CM

## 2022-02-04 DIAGNOSIS — Z8719 Personal history of other diseases of the digestive system: Secondary | ICD-10-CM

## 2022-02-04 DIAGNOSIS — L03316 Cellulitis of umbilicus: Secondary | ICD-10-CM | POA: Diagnosis not present

## 2022-02-04 MED ORDER — AMOXICILLIN-POT CLAVULANATE 875-125 MG PO TABS: 1.0000 | ORAL_TABLET | Freq: Two times a day (BID) | ORAL | 0 refills | Status: DC

## 2022-02-04 NOTE — Patient Instructions (Addendum)
Thank you for coming to the office today.  Antibiotic twice a day for 10 days.  Referral sent to the Surgeons so they can take a look and consider a revision or minor procedure.  [facility] [address] [place],  [place]  [address] Phone: [phone]  -----------------  Referral to Dermatologist for the raised spot on left forearm, they may freeze it off.  Please schedule a Follow-up Appointment to: Return if symptoms worsen or fail to improve.  If you have any other questions or concerns, please feel free to call the office or send a message through Tok. You may also schedule an earlier appointment if necessary.  Additionally, you may be receiving a survey about your experience at our office within a few days to 1 week by e-mail or mail. We value your feedback.  Nobie Putnam, DO [place]

## 2022-02-04 NOTE — Progress Notes (Signed)
Subjective:    Patient ID: Ryan [name], male    DOB: [birth year], 67 y.o.   MRN: [id number]  Ryan [name] is a 67 y.o. male presenting on 02/04/2022 for issue with belly button   HPI  Umbilical Skin Infection History of prior Hernia repair surgery  09/2021 had cellulitis and drainage of umbilical area - ED visit, had CT imaging see below, treated with Bactrim antibiotic.  Keratoacanthoma / Lesion - Left Forearm  Chronic Low Back Pain Lumbar DDD / Radiculopathy Followed by pain management w injections Needs new handicap placard  Past Surgical History:  Procedure Laterality Date   BACK SURGERY     CATARACT EXTRACTION W/PHACO Left 01/06/2016   Procedure: CATARACT EXTRACTION PHACO AND INTRAOCULAR LENS PLACEMENT (I[facility]);  Surgeon: Nevada Crane, MD;  Location: [facility];  Service: Ophthalmology;  Laterality: Left;  LEFT   COLONOSCOPY WITH PROPOFOL N/A 04/19/2016   Procedure: COLONOSCOPY WITH PROPOFOL;  Surgeon: Scot Jun, MD;  Location: [facility];  Service: Endoscopy;  Laterality: N/A;   ESOPHAGOGASTRODUODENOSCOPY (EGD) WITH PROPOFOL N/A 10/24/2015   Procedure: ESOPHAGOGASTRODUODENOSCOPY (EGD) WITH PROPOFOL;  Surgeon: Scot Jun, MD;  Location: [facility] ENDOSCOPY;  Service: Endoscopy;  Laterality: N/A;   EYE SURGERY     HERNIA REPAIR     ERRONEOUS UMBILICAL HERNIA   2011   MYRINGOTOMY WITH TUBE PLACEMENT Bilateral 03/28/2015   Procedure: MYRINGOTOMY WITH TUBE PLACEMENT;  Surgeon: Linus Salmons, MD;  Location: [facility];  Service: ENT;  Laterality: Bilateral;  BUTTERFLY TUBE   SINUSOTOMY  02/28/2019   TONSILLECTOMY     TRANSFORAMINAL LUMBAR INTERBODY FUSION (TLIF) WITH PEDICLE SCREW FIXATION 1 LEVEL Right 06/18/2014   Procedure: TRANSFORAMINAL LUMBAR INTERBODY FUSION (TLIF) WITH PEDICLE SCREW FIXATION 1 LEVEL LUMBAR 5 -SACRAL 1;  Surgeon: Aliene Beams, MD;  Location: [facility];  Service: Neurosurgery;  Laterality: Right;  Right transforaminal  lumbar interbody fusion with interbody prosthesis and percutaneous pedicle screws Lumbar 5 to Sacral 1        12/09/2021    8:39 AM 11/18/2021    2:18 PM 06/08/2021    8:59 AM  Depression screen PHQ 2/9  Decreased Interest 0 1 1  Down, Depressed, Hopeless 0 0 0  PHQ - 2 Score 0 1 1  Altered sleeping 0 1   Tired, decreased energy  2   Change in appetite 0 0   Feeling bad or failure about yourself  0 0   Trouble concentrating  0   Moving slowly or fidgety/restless 0 0   Suicidal thoughts 0 0   PHQ-9 Score 0 4     Social History   Tobacco Use   Smoking status: Every Day    Packs/day: 1.50    Years: 40.00    Total pack years: 60.00    Types: Cigarettes   Smokeless tobacco: Current   Tobacco comments:    has cut back to 1PPD  Vaping Use   Vaping Use: Never used  Substance Use Topics   Alcohol use: Yes    Alcohol/week: 0.0 standard drinks of alcohol    Comment: ONCE OR TWICE A YR   Drug use: Yes    Types: Marijuana, "Crack" cocaine    Review of Systems Per HPI unless specifically indicated above     Objective:    BP 132/80   Pulse 62   Ht 5\' 7"  (1.702 m)   Wt 169 lb 12.8 oz (77 kg)   SpO2 99%  BMI 26.59 kg/m   Wt Readings from Last 3 Encounters:  02/04/22 169 lb 12.8 oz (77 kg)  12/09/21 170 lb (77.1 kg)  11/29/21 171 lb 15.3 oz (78 kg)    Physical Exam Vitals and nursing note reviewed.  Constitutional:      General: He is not in acute distress.    Appearance: Normal appearance. He is well-developed. He is not diaphoretic.     Comments: Well-appearing, comfortable, cooperative  HENT:     Head: Normocephalic and atraumatic.  Eyes:     General:        Right eye: No discharge.        Left eye: No discharge.     Conjunctiva/sclera: Conjunctivae normal.  Cardiovascular:     Rate and Rhythm: Normal rate.  Pulmonary:     Effort: Pulmonary effort is normal.  Skin:    General: Skin is warm and dry.     Findings: Lesion (Left forearm outer aspect 1 cm  raised thick keratotic lesion) present. No erythema or rash.     Comments: Umbilicus with crusty discharge dried. Some slight localized erythema, and abnormal defect of umbilicus likely from prior surgery. No obvious herniation.  Neurological:     Mental Status: He is alert and oriented to person, place, and time.  Psychiatric:        Mood and Affect: Mood normal.        Behavior: Behavior normal.        Thought Content: Thought content normal.     Comments: Well groomed, good eye contact, normal speech and thoughts     Umbilicus     I have personally reviewed the radiology report from 10/05/21 and 10/08/21   CLINICAL DATA:  Abdominal pain, periumbilical pain, symptoms for 1 week, associated nausea, vomiting, diarrhea, history of hernia   EXAM: CT ABDOMEN AND PELVIS WITH CONTRAST   TECHNIQUE: Multidetector CT imaging of the abdomen and pelvis was performed using the standard protocol following bolus administration of intravenous contrast.   RADIATION DOSE REDUCTION: This exam was performed according to the departmental dose-optimization program which includes automated exposure control, adjustment of the mA and/or kV according to patient size and/or use of iterative reconstruction technique.   CONTRAST:  153mL OMNIPAQUE IOHEXOL 300 MG/ML SOLN IV. No oral contrast.   COMPARISON:  10/19/2017   FINDINGS: Lower chest: Lung bases clear   Hepatobiliary: Gallbladder and liver normal appearance   Pancreas: Normal appearance   Spleen: Normal appearance   Adrenals/Urinary Tract: Adrenal glands normal appearance. Kidneys, ureters, and bladder normal appearance   Stomach/Bowel: Appendix normal in size without wall thickening or inflammatory changes, though several Saunders appendicoliths are present. Diverticulosis of sigmoid colon without definite wall thickening or pericolic inflammatory changes to suggest acute diverticulitis. Stomach decompressed. Gallen bowel  loops unremarkable.   Vascular/Lymphatic: Atherosclerotic calcifications aorta and iliac arteries. Aorta normal caliber. 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 No adenopathy.   Reproductive: Prostatic enlargement, gland measuring 5.6 x 4.6 x 4.9 cm (volume = 66 cm^3) with central lobe extending into bladder base   Other: No free air or free fluid. No hernia. Skin thickening and subcutaneous infiltration at umbilicus question cellulitis, new. No discrete abscess collection or fascial defect seen.   Musculoskeletal: Multilevel advanced degenerative disc and facet disease changes lumbar spine with prior lumbar fusion L5-S1.   IMPRESSION: Skin thickening and subcutaneous infiltration at umbilicus question cellulitis, new.   No evidence of abscess or hernia.  Prostatic enlargement.   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   Sigmoid diverticulosis without evidence of diverticulitis.   Aortic Atherosclerosis (ICD10-I70.0).     Electronically Signed   By: Ulyses Southward M.D.   On: 10/05/2021 12:55  ----  CLINICAL DATA:  Raised draining lesion at the umbilicus.  Pain.   EXAM: ULTRASOUND ABDOMEN LIMITED   COMPARISON:  CT abdomen and pelvis 10/05/2021   FINDINGS: There is skin thickening at the level of the umbilicus. There is edematous subcutaneous tissue in this region without discrete drainable fluid collection identified. No evidence for hernia.   Sonographer notes examination is limited secondary to patient's pain.   IMPRESSION: 1. Skin thickening and subcutaneous edema at the level of the umbilicus may represent cellulitis. No discrete fluid collection identified     Electronically Signed   By: Darliss Cheney M.D.   On: 10/08/2021 18:51   Results for orders placed or performed during the hospital encounter of 11/29/21  Resp Panel by RT-PCR (Flu A&B, Covid) Anterior Nasal Swab   Specimen:  Anterior Nasal Swab  Result Value Ref Range   SARS Coronavirus 2 by RT PCR NEGATIVE NEGATIVE   Influenza A by PCR NEGATIVE NEGATIVE   Influenza B by PCR NEGATIVE NEGATIVE  Expectorated Sputum Assessment w Gram Stain, Rflx to Resp Cult   Specimen: Sputum  Result Value Ref Range   Specimen Description SPUTUM    Special Requests NONE    Sputum evaluation      THIS SPECIMEN IS ACCEPTABLE FOR SPUTUM CULTURE Performed at [facility], [address], [place], [place] [address]    Report Status 11/30/2021 FINAL   Culture, Respiratory w Gram Stain   Specimen: SPU  Result Value Ref Range   Specimen Description      SPUTUM Performed at [facility], [address] [place], [place] [address]    Special Requests      NONE Reflexed from [id number] Performed at [facility], [address] [place], [place] [address]    Gram Stain      FEW WBC PRESENT, PREDOMINANTLY PMN FEW GRAM POSITIVE COCCI IN PAIRS IN CHAINS RARE GRAM NEGATIVE RODS    Culture      MODERATE Normal respiratory flora-no Staph aureus or Pseudomonas seen Performed at [facility], [address] [place], [place] [address]    Report Status 12/02/2021 FINAL   CBC with Differential  Result Value Ref Range   WBC 14.4 (H) 4.0 - 10.5 K/uL   RBC 4.34 4.22 - 5.81 MIL/uL   Hemoglobin 13.8 13.0 - 17.0 g/dL   HCT 02.7 25.3 - 66.4 %   MCV 94.0 80.0 - 100.0 fL   MCH 31.8 26.0 - 34.0 pg   MCHC 33.8 30.0 - 36.0 g/dL   RDW 40.3 47.4 - 25.9 %   Platelets 210 150 - 400 K/uL   nRBC 0.0 0.0 - 0.2 %   Neutrophils Relative % 76 %   Neutro Abs 11.0 (H) 1.7 - 7.7 K/uL   Lymphocytes Relative 15 %   Lymphs Abs 2.2 0.7 - 4.0 K/uL   Monocytes Relative 7 %   Monocytes Absolute 1.0 0.1 - 1.0 K/uL   Eosinophils Relative 1 %   Eosinophils Absolute 0.2 0.0 - 0.5 K/uL   Basophils Relative 0 %   Basophils Absolute 0.1 0.0 - 0.1 K/uL   Immature Granulocytes 1 %   Abs Immature Granulocytes 0.07 0.00 - 0.07  K/uL  Comprehensive metabolic panel  Result Value Ref Range   Sodium 137 135 - 145 mmol/L   Potassium 3.6 3.5 - 5.1 mmol/L   Chloride 102 98 - 111 mmol/L   CO2 26 22 - 32 mmol/L   Glucose, Bld 106 (H) 70 - 99 mg/dL   BUN 16 8 - 23 mg/dL   Creatinine, Ser 0.86 0.61 - 1.24 mg/dL   Calcium 8.7 (L) 8.9 - 10.3 mg/dL   Total Protein 6.9 6.5 - 8.1 g/dL   Albumin 3.7 3.5 - 5.0 g/dL   AST 20 15 - 41 U/L   ALT 14 0 - 44 U/L   Alkaline Phosphatase 119 38 - 126 U/L   Total Bilirubin 0.9 0.3 - 1.2 mg/dL   GFR, Estimated >60 >60 mL/min   Anion gap 9 5 - 15  Lipase, blood  Result Value Ref Range   Lipase 36 11 - 51 U/L  Lactic acid, plasma  Result Value Ref Range   Lactic Acid, Venous 1.7 0.5 - 1.9 mmol/L  Lactic acid, plasma  Result Value Ref Range   Lactic Acid, Venous 2.2 (HH) 0.5 - 1.9 mmol/L  Lactic acid, plasma  Result Value Ref Range   Lactic Acid, Venous 2.3 (HH) 0.5 - 1.9 mmol/L  Lactic acid, plasma  Result Value Ref Range   Lactic Acid, Venous 2.7 (HH) 0.5 - 1.9 mmol/L  Basic metabolic panel  Result Value Ref Range   Sodium 137 135 - 145 mmol/L   Potassium 3.5 3.5 - 5.1 mmol/L   Chloride 104 98 - 111 mmol/L   CO2 24 22 - 32 mmol/L   Glucose, Bld 191 (H) 70 - 99 mg/dL   BUN 12 8 - 23 mg/dL   Creatinine, Ser 0.75 0.61 - 1.24 mg/dL   Calcium 8.9 8.9 - 10.3 mg/dL   GFR, Estimated >60 >60 mL/min   Anion gap 9 5 - 15  CBC  Result Value Ref Range   WBC 19.5 (H) 4.0 - 10.5 K/uL   RBC 4.16 (L) 4.22 - 5.81 MIL/uL   Hemoglobin 13.2 13.0 - 17.0 g/dL   HCT 39.0 39.0 - 52.0 %   MCV 93.8 80.0 - 100.0 fL   MCH 31.7 26.0 - 34.0 pg   MCHC 33.8 30.0 - 36.0 g/dL   RDW 12.1 11.5 - 15.5 %   Platelets 237 150 - 400 K/uL   nRBC 0.0 0.0 - 0.2 %  Lipid panel  Result Value Ref Range   Cholesterol 84 0 - 200 mg/dL   Triglycerides 26 <150 mg/dL   HDL 40 (L) >40 mg/dL   Total CHOL/HDL Ratio 2.1 RATIO   VLDL 5 0 - 40 mg/dL   LDL Cholesterol 39 0 - 99 mg/dL  Troponin I (High Sensitivity)   Result Value Ref Range   Troponin I (High Sensitivity) 36 (H) <18 ng/L  Troponin I (High Sensitivity)  Result Value Ref Range   Troponin I (High Sensitivity) 33 (H) <18 ng/L      Assessment & Plan:   Problem List Items Addressed This Visit     Lumbar radiculopathy   Lumbar spondylosis   Other Visit Diagnoses     Cellulitis, umbilical    -  Primary   Relevant Medications   amoxicillin-clavulanate (AUGMENTIN) 875-125 MG tablet   Other Relevant Orders   Ambulatory referral to General Surgery   Hx of umbilical hernia repair       Relevant Orders   Ambulatory referral to General  Surgery   Keratoacanthoma       Relevant Orders   Ambulatory referral to Dermatology       Umbilical Drainage Question if non healing wound within umbilicus Chronic problem since prior umbilical hernia repair years ago  Empiric coverage w Augmentin Antibiotic twice a day for 10 days.  Referral sent to the Surgeons so they can take a look and consider a revision or minor procedure.  [facility] [address] [place],  [place]  [address] Phone: [phone]  -----------------  Acanthoma on L arm Referral to Dermatologist for the raised spot on left forearm cryotherapy  Lumbar DDD Handicap Placard  Orders Placed This Encounter  Procedures   Ambulatory referral to General Surgery    Referral Priority:   Routine    Referral Type:   Surgical    Referral Reason:   Specialty Services Required    Requested Specialty:   General Surgery    Number of Visits Requested:   1   Ambulatory referral to Dermatology    Referral Priority:   Routine    Referral Type:   Consultation    Referral Reason:   Specialty Services Required    Requested Specialty:   Dermatology    Number of Visits Requested:   1     Meds ordered this encounter  Medications   amoxicillin-clavulanate (AUGMENTIN) 875-125 MG tablet    Sig: Take 1 tablet by mouth 2 (two) times daily.     Dispense:  20 tablet    Refill:  0      Follow up plan: Return if symptoms worsen or fail to improve.   Saralyn Pilar, DO [facility] 02/04/2022, 10:46 AM

## 2022-02-10 ENCOUNTER — Ambulatory Visit
Admission: RE | Admit: 2022-02-10 | Discharge: 2022-02-10 | Disposition: A | Discharge status: Home / Self Care | Payer: 59 | Source: Ambulatory Visit | Attending: Student in an Organized Health Care Education/Training Program | Admitting: Student in an Organized Health Care Education/Training Program

## 2022-02-10 DIAGNOSIS — M5134 Other intervertebral disc degeneration, thoracic region: Secondary | ICD-10-CM | POA: Insufficient documentation

## 2022-02-10 DIAGNOSIS — M47816 Spondylosis without myelopathy or radiculopathy, lumbar region: Secondary | ICD-10-CM | POA: Insufficient documentation

## 2022-02-10 DIAGNOSIS — M48062 Spinal stenosis, lumbar region with neurogenic claudication: Secondary | ICD-10-CM | POA: Diagnosis present

## 2022-02-10 DIAGNOSIS — M961 Postlaminectomy syndrome, not elsewhere classified: Secondary | ICD-10-CM

## 2022-02-10 DIAGNOSIS — G894 Chronic pain syndrome: Secondary | ICD-10-CM | POA: Diagnosis present

## 2022-02-10 NOTE — Progress Notes (Signed)
Patient ID: Ryan [name], male   DOB: 19-Jul-1955, 67 y.o.   MRN: [id number]  Chief Complaint: Umbilical drainage  History of Present Illness Ryan [name] is a 67 y.o. male with recent history of drainage, emanating from the deep umbilical dermis, treated with oral antibiotics.  Now seemingly resolved.  Reports intermittent history over the last couple months.  Denies any umbilical bulge.  Uncertain as to whether he has had prior umbilical hernia repair.  Denies pain, nausea, vomiting fevers or chills.  Reports normal bowel activity.  Past Medical History Past Medical History:  Diagnosis Date   Anxiety    Arthritis    left hand   Asthma    Chronic kidney disease    HAS HAD KIDNEY STONE 2015-- JUST PASSED   COPD (chronic obstructive pulmonary disease) (HCC)    Dyspnea    GERD (gastroesophageal reflux disease)    TAKES TUMS FOR RELIEF   Hyperlipidemia    Hypertension    Improved, No longer on meds   Lower urinary tract symptoms (LUTS)    Prostate disease    Stroke [facility])    Thyroid disease    Wears dentures    hass full upper plate, not wearing, broken   Wears hearing aid       Past Surgical History:  Procedure Laterality Date   BACK SURGERY     CATARACT EXTRACTION W/PHACO Left 01/06/2016   Procedure: CATARACT EXTRACTION PHACO AND INTRAOCULAR LENS PLACEMENT ([place]);  Surgeon: Eulogio Bear, MD;  Location: [place];  Service: Ophthalmology;  Laterality: Left;  LEFT   COLONOSCOPY WITH PROPOFOL N/A 04/19/2016   Procedure: COLONOSCOPY WITH PROPOFOL;  Surgeon: Manya Silvas, MD;  Location: [facility] ENDOSCOPY;  Service: Endoscopy;  Laterality: N/A;   ESOPHAGOGASTRODUODENOSCOPY (EGD) WITH PROPOFOL N/A 10/24/2015   Procedure: ESOPHAGOGASTRODUODENOSCOPY (EGD) WITH PROPOFOL;  Surgeon: Manya Silvas, MD;  Location: [facility] ENDOSCOPY;  Service: Endoscopy;  Laterality: N/A;   EYE SURGERY     HERNIA REPAIR     ERRONEOUS UMBILICAL HERNIA   0347   MYRINGOTOMY WITH TUBE PLACEMENT  Bilateral 03/28/2015   Procedure: MYRINGOTOMY WITH TUBE PLACEMENT;  Surgeon: Beverly Gust, MD;  Location: [place];  Service: ENT;  Laterality: Bilateral;  BUTTERFLY TUBE   SINUSOTOMY  02/28/2019   TONSILLECTOMY     TRANSFORAMINAL LUMBAR INTERBODY FUSION (TLIF) WITH PEDICLE SCREW FIXATION 1 LEVEL Right 06/18/2014   Procedure: TRANSFORAMINAL LUMBAR INTERBODY FUSION (TLIF) WITH PEDICLE SCREW FIXATION 1 LEVEL LUMBAR 5 -SACRAL 1;  Surgeon: Karie Chimera, MD;  Location: [place] [facility];  Service: Neurosurgery;  Laterality: Right;  Right transforaminal lumbar interbody fusion with interbody prosthesis and percutaneous pedicle screws Lumbar 5 to Sacral 1    No Known Allergies  Current Outpatient Medications  Medication Sig Dispense Refill   albuterol (PROVENTIL) (2.5 MG/3ML) 0.083% nebulizer solution Take 3 mLs (2.5 mg total) by nebulization every 6 (six) hours as needed for wheezing or shortness of breath. 150 mL 5   albuterol (VENTOLIN HFA) 108 (90 Base) MCG/ACT inhaler Inhale 2 puffs into the lungs every 6 (six) hours as needed for wheezing or shortness of breath. 8 g 3   ALPRAZolam (XANAX) 1 MG tablet Take 1 tablet (1 mg total) by mouth 2 (two) times daily as needed. 20 tablet 0   amoxicillin-clavulanate (AUGMENTIN) 875-125 MG tablet Take 1 tablet by mouth 2 (two) times daily. 20 tablet 0   aspirin EC 81 MG EC tablet Take 1 tablet (81 mg total) by  mouth daily. Swallow whole. 30 tablet 11   atorvastatin (LIPITOR) 80 MG tablet Take 1 tablet (80 mg total) by mouth at bedtime. 90 tablet 3   fluticasone furoate-vilanterol (BREO ELLIPTA) 200-25 MCG/ACT AEPB Inhale 1 puff into the lungs daily. 28 each EACH   gabapentin (NEURONTIN) 400 MG capsule Take 400 mg by mouth in the morning and at bedtime. 400 mg twice a day as per patient --psychiatrist changed that Normally takes 400 mg daily     lisinopril (ZESTRIL) 20 MG tablet Take 1 tablet (20 mg total) by mouth daily. 90 tablet 3   nicotine  (NICODERM CQ - DOSED IN MG/24 HOURS) 21 mg/24hr patch Place 1 patch (21 mg total) onto the skin daily. 28 patch 0   sildenafil (REVATIO) 20 MG tablet Take 4-5 tablets (80-100 mg total) by mouth as needed (ED). 60 tablet 5   tamsulosin (FLOMAX) 0.4 MG CAPS capsule Take 1 capsule (0.4 mg total) by mouth in the morning and at bedtime. 180 capsule 3   No current facility-administered medications for this visit.    Family History Family History  Problem Relation Age of Onset   Arthritis Mother    Hypertension Mother    Diabetes Father    Hypertension Father       Social History Social History   Tobacco Use   Smoking status: Every Day    Packs/day: 1.50    Years: 40.00    Total pack years: 60.00    Types: Cigarettes   Smokeless tobacco: Current   Tobacco comments:    has cut back to 1PPD  Vaping Use   Vaping Use: Never used  Substance Use Topics   Alcohol use: Yes    Alcohol/week: 0.0 standard drinks of alcohol    Comment: ONCE OR TWICE A YR   Drug use: Yes    Types: Marijuana, "Crack" cocaine        Review of Systems  Constitutional: Negative.   HENT:  Positive for hearing loss and tinnitus.   Eyes:  Positive for blurred vision.  Respiratory:  Positive for cough, shortness of breath and wheezing.   Gastrointestinal: Negative.   Genitourinary: Negative.   Skin: Negative.   Neurological: Negative.   Psychiatric/Behavioral:  Positive for depression.      Physical Exam Blood pressure (!) 177/102, pulse 92, temperature 98 F (36.7 C), temperature source Oral, height 5\' 7"  (1.702 m), weight 167 lb (75.8 kg), SpO2 94 %. Last Weight  Most recent update: 02/11/2022 10:14 AM    Weight  75.8 kg (167 lb)             CONSTITUTIONAL: Well developed, and nourished, appropriately responsive and aware without distress.   EYES: Sclera non-icteric.   EARS, NOSE, MOUTH AND THROAT:  The oropharynx is clear. Oral mucosa is pink and moist.  Hearing is intact to voice.  NECK:  Trachea is midline, and there is no jugular venous distension.  LYMPH NODES:  Lymph nodes in the neck are not appreciated. RESPIRATORY:  Lungs are clear, and breath sounds are equal bilaterally. Normal respiratory effort without pathologic use of accessory muscles. CARDIOVASCULAR: Heart is regular in rate and rhythm.  Well perfused.  GI: The abdomen is soft, nontender, and nondistended. There were no palpable masses. I did not appreciate hepatosplenomegly.  There is a dry free crust in the umbilical area, this was removed with Q-tip, I see no underlying residual skin lesion, drainage, area of erythema, erosion or ulcer. MUSCULOSKELETAL:  Symmetrical muscle  tone appreciated in all four extremities.    SKIN: Skin turgor is normal. No pathologic skin lesions appreciated.  NEUROLOGIC:  Motor and sensation appear grossly normal.  Cranial nerves are grossly without defect. PSYCH:  Alert and oriented to person, place and time. Affect is appropriate for situation.  Data Reviewed I have personally reviewed what is currently available of the patient's imaging, recent labs and medical records.   Labs:     Latest Ref Rng & Units 11/30/2021    4:16 AM 11/29/2021    2:54 AM 11/28/2021    6:07 AM  CBC  WBC 4.0 - 10.5 K/uL 19.5  14.4  10.2   Hemoglobin 13.0 - 17.0 g/dL 99.8  33.8  25.0   Hematocrit 39.0 - 52.0 % 39.0  40.8  39.8   Platelets 150 - 400 K/uL 237  210  182       Latest Ref Rng & Units 11/30/2021    4:16 AM 11/29/2021    2:54 AM 11/28/2021    6:07 AM  CMP  Glucose 70 - 99 mg/dL 539  767  92   BUN 8 - 23 mg/dL 12  16  22    Creatinine 0.61 - 1.24 mg/dL  3.41  9.37   Sodium 135 - 145 mmol/L 137  137  136   Potassium 3.5 - 5.1 mmol/L 3.5  3.6  3.8   Chloride 98 - 111 mmol/L 104  102  106   CO2 22 - 32 mmol/L 24  26  24    Calcium 8.9 - 10.3 mg/dL 8.9  8.7  8.4   Total Protein 6.5 - 8.1 g/dL  6.9    Total Bilirubin 0.3 - 1.2 mg/dL  0.9    Alkaline Phos 38 - 126 U/L  119    AST 15 - 41  U/L  20    ALT 0 - 44 U/L  14        Imaging: Radiological images reviewed:   Within last 24 hrs: No results found.  Assessment    Evidence of umbilical hernia, cystic lesion or abscess requiring surgical intervention. Patient Active Problem List   Diagnosis Date Noted   COPD exacerbation (HCC) 11/29/2021   Polysubstance abuse (HCC) 11/29/2021   Myocardial injury 11/29/2021   Severe sepsis (HCC) 11/29/2021   Hypotension 11/27/2021   COPD (chronic obstructive pulmonary disease) (HCC) 11/27/2021   AKI (acute kidney injury) (HCC) 11/27/2021   Anxiety 11/27/2021   Tobacco abuse 11/27/2021   Alcohol use 11/27/2021   Hypokalemia 11/27/2021   SIRS (systemic inflammatory response syndrome) (HCC) 11/27/2021   Near syncope 11/27/2021   BPH (benign prostatic hyperplasia) 11/27/2021   Alcohol-induced mood disorder (HCC) 08/02/2020   History of CVA (cerebrovascular accident) without residual deficits 10/23/2019   Pre-diabetes 10/23/2019   HLD (hyperlipidemia) 10/23/2019   Chronic pain syndrome 08/15/2019   Lumbar spondylosis 03/07/2018   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  Seems to have resolved.  Not certain what surgery could  offer at this point in time.  Face-to-face time spent with the patient and accompanying care providers(if present) was 20 minutes, with more than 50% of the time spent counseling, educating, and coordinating care of the patient.     These notes generated with voice recognition software. I apologize for typographical errors.  Ronny Bacon M.D., FACS 02/12/2022, 4:02 PM

## 2022-02-11 ENCOUNTER — Encounter: Payer: Self-pay | Admitting: Surgery

## 2022-02-11 ENCOUNTER — Ambulatory Visit (INDEPENDENT_AMBULATORY_CARE_PROVIDER_SITE_OTHER): Payer: 59 | Admitting: Surgery

## 2022-02-11 VITALS — BP 177/102 | HR 92 | Temp 98.0°F | Ht 67.0 in | Wt 167.0 lb

## 2022-02-11 DIAGNOSIS — R198 Other specified symptoms and signs involving the digestive system and abdomen: Secondary | ICD-10-CM

## 2022-02-11 NOTE — Patient Instructions (Addendum)
If you have any concerns or questions, please feel free to call our office. Follow up as needed.   Umbilical Hernia, Adult  A hernia is a bulge of tissue that pushes through an opening between muscles. An umbilical hernia happens in the abdomen, near the belly button (umbilicus). The hernia may contain tissues from the [place] intestine, large intestine, or fatty tissue covering the intestines. Umbilical hernias in adults tend to get worse over time, and they require surgical treatment. There are different types of umbilical hernias, including: Indirect hernia. This type is located just above or below the umbilicus. It is the most common type of umbilical hernia in adults. Direct hernia. This type forms through an opening formed by the umbilicus. Reducible hernia. This type of hernia comes and goes. It may be visible only when you strain, lift something heavy, or cough. This type of hernia can be pushed back into the abdomen (reduced). Incarcerated hernia. This type traps abdominal tissue inside the hernia. This type of hernia cannot be reduced. Strangulated hernia. This type of hernia cuts off blood flow to the tissues inside the hernia. The tissues can start to die if this happens. This type of hernia requires emergency treatment. What are the causes? An umbilical hernia happens when tissue inside the abdomen presses on a weak area of the abdominal muscles. What increases the risk? You may have a greater risk of this condition if you: Are obese. Have had several pregnancies. Have a buildup of fluid inside your abdomen. Have had surgery that weakens the abdominal muscles. What are the signs or symptoms? The main symptom of this condition is a painless bulge at or near the belly button. A reducible hernia may be visible only when you strain, lift something heavy, or cough. Other symptoms may include: Dull pain. A feeling of pressure. Symptoms of a strangulated hernia may include: Pain that  gets increasingly worse. Nausea and vomiting. Pain when pressing on the hernia. Skin over the hernia becoming red or purple. Constipation. Blood in the stool. How is this diagnosed? This condition may be diagnosed based on: A physical exam. You may be asked to cough or strain while standing. These actions increase the pressure inside your abdomen and can force the hernia through the opening in your muscles. Your health care provider may try to reduce the hernia by pressing on it. Your symptoms and medical history. How is this treated? Surgery is the only treatment for an umbilical hernia. Surgery for a strangulated hernia is done as soon as possible. If you have a Thier hernia that is not incarcerated, you may need to lose weight before having surgery. Follow these instructions at home: Lose weight, if told by your health care provider. Do not try to push the hernia back in. Watch your hernia for any changes in color or size. Tell your health care provider if any changes occur. You may need to avoid activities that increase pressure on your hernia. Do not lift anything that is heavier than 10 lb (4.5 kg), or the limit that you are told, until your health care provider says that it is safe. Take over-the-counter and prescription medicines only as told by your health care provider. Keep all follow-up visits. This is important. Contact a health care provider if: Your hernia gets larger. Your hernia becomes painful. Get help right away if: You develop sudden, severe pain near the area of your hernia. You have pain as well as nausea or vomiting. You have pain and  the skin over your hernia changes color. You develop a fever or chills. Summary A hernia is a bulge of tissue that pushes through an opening between muscles. An umbilical hernia happens near the belly button. Surgery is the only treatment for an umbilical hernia. Do not try to push your hernia back in. Keep all follow-up visits.  This is important. This information is not intended to replace advice given to you by your health care provider. Make sure you discuss any questions you have with your health care provider. Document Revised: 08/20/2019 Document Reviewed: 08/20/2019 Elsevier Patient Education  [place].

## 2022-02-12 DIAGNOSIS — R198 Other specified symptoms and signs involving the digestive system and abdomen: Secondary | ICD-10-CM | POA: Insufficient documentation

## 2022-02-17 ENCOUNTER — Ambulatory Visit
Admission: RE | Admit: 2022-02-17 | Discharge: 2022-02-17 | Disposition: A | Discharge status: Home / Self Care | Payer: 59 | Source: Ambulatory Visit | Attending: Student in an Organized Health Care Education/Training Program | Admitting: Student in an Organized Health Care Education/Training Program

## 2022-02-17 ENCOUNTER — Encounter: Payer: Self-pay | Admitting: Student in an Organized Health Care Education/Training Program

## 2022-02-17 ENCOUNTER — Ambulatory Visit
Payer: 59 | Attending: Student in an Organized Health Care Education/Training Program | Admitting: Student in an Organized Health Care Education/Training Program

## 2022-02-17 DIAGNOSIS — G894 Chronic pain syndrome: Secondary | ICD-10-CM | POA: Insufficient documentation

## 2022-02-17 DIAGNOSIS — M47816 Spondylosis without myelopathy or radiculopathy, lumbar region: Secondary | ICD-10-CM | POA: Diagnosis present

## 2022-02-17 MED ORDER — LIDOCAINE HCL (PF) 2 % IJ SOLN
INTRAMUSCULAR | Status: AC
  Filled 2022-02-17: qty 10

## 2022-02-17 MED ORDER — LACTATED RINGERS IV SOLN: Freq: Once | INTRAVENOUS | Status: DC

## 2022-02-17 MED ORDER — ROPIVACAINE HCL 2 MG/ML IJ SOLN
INTRAMUSCULAR | Status: AC
  Filled 2022-02-17: qty 20

## 2022-02-17 MED ORDER — FENTANYL CITRATE (PF) 100 MCG/2ML IJ SOLN
25.0000 ug | INTRAMUSCULAR | Status: DC | PRN
  Administered 2022-02-17: 75 ug via INTRAVENOUS

## 2022-02-17 MED ORDER — ROPIVACAINE HCL 2 MG/ML IJ SOLN
9.0000 mL | Freq: Once | INTRAMUSCULAR | Status: AC
  Administered 2022-02-17: 9 mL via PERINEURAL

## 2022-02-17 MED ORDER — MIDAZOLAM HCL 5 MG/5ML IJ SOLN
INTRAMUSCULAR | Status: AC
  Filled 2022-02-17: qty 5

## 2022-02-17 MED ORDER — MIDAZOLAM HCL 5 MG/5ML IJ SOLN
0.5000 mg | Freq: Once | INTRAMUSCULAR | Status: AC
  Administered 2022-02-17: 1.5 mg via INTRAVENOUS

## 2022-02-17 MED ORDER — DEXAMETHASONE SODIUM PHOSPHATE 10 MG/ML IJ SOLN
INTRAMUSCULAR | Status: AC
  Filled 2022-02-17: qty 2

## 2022-02-17 MED ORDER — FENTANYL CITRATE (PF) 100 MCG/2ML IJ SOLN
INTRAMUSCULAR | Status: AC
  Filled 2022-02-17: qty 2

## 2022-02-17 MED ORDER — LIDOCAINE HCL 2 % IJ SOLN
20.0000 mL | Freq: Once | INTRAMUSCULAR | Status: AC
  Administered 2022-02-17: 100 mg

## 2022-02-17 MED ORDER — DEXAMETHASONE SODIUM PHOSPHATE 10 MG/ML IJ SOLN
10.0000 mg | Freq: Once | INTRAMUSCULAR | Status: AC
  Administered 2022-02-17: 10 mg

## 2022-02-17 NOTE — Patient Instructions (Signed)
____________________________________________________________________________________________  Post-Procedure Discharge Instructions  Instructions:  Apply ice:   Purpose: This will minimize any swelling and discomfort after procedure.   When: Day of procedure, as soon as you get home.  How: Fill a plastic sandwich bag with crushed ice. Cover it with a [place] towel and apply to injection site.  How long: (15 min on, 15 min off) Apply for 15 minutes then remove x 15 minutes.  Repeat sequence on day of procedure, until you go to bed.  Apply heat:   Purpose: To treat any soreness and discomfort from the procedure.  When: Starting the next day after the procedure.  How: Apply heat to procedure site starting the day following the procedure.  How long: May continue to repeat daily, until discomfort goes away.  Food intake: Start with clear liquids (like water) and advance to regular food, as tolerated.   Physical activities: Keep activities to a minimum for the first 8 hours after the procedure. After that, then as tolerated.  Driving: If you have received any sedation, be responsible and do not drive. You are not allowed to drive for 24 hours after having sedation.  Blood thinner: (Applies only to those taking blood thinners) You may restart your blood thinner 6 hours after your procedure.  Insulin: (Applies only to Diabetic patients taking insulin) As soon as you can eat, you may resume your normal dosing schedule.  Infection prevention: Keep procedure site clean and dry. Shower daily and clean area with soap and water.  Post-procedure Pain Diary: Extremely important that this be done correctly and accurately. Recorded information will be used to determine the next step in treatment. For the purpose of accuracy, follow these rules:  Evaluate only the area treated. Do not report or include pain from an untreated area. For the purpose of this evaluation, ignore all other areas of pain,  except for the treated area.  After your procedure, avoid taking a long nap and attempting to complete the pain diary after you wake up. Instead, set your alarm clock to go off every hour, on the hour, for the initial 8 hours after the procedure. Document the duration of the numbing medicine, and the relief you are getting from it.  Do not go to sleep and attempt to complete it later. It will not be accurate. If you received sedation, it is likely that you were given a medication that may cause amnesia. Because of this, completing the diary at a later time may cause the information to be inaccurate. This information is needed to plan your care.  Follow-up appointment: Keep your post-procedure follow-up evaluation appointment after the procedure (usually 2 weeks for most procedures, 6 weeks for radiofrequencies). DO NOT FORGET to bring you pain diary with you.   Expect: (What should I expect to see with my procedure?)  From numbing medicine (AKA: Local Anesthetics): Numbness or decrease in pain. You may also experience some weakness, which if present, could last for the duration of the local anesthetic.  Onset: Full effect within 15 minutes of injected.  Duration: It will depend on the type of local anesthetic used. On the average, 1 to 8 hours.   From steroids (Applies only if steroids were used): Decrease in swelling or inflammation. Once inflammation is improved, relief of the pain will follow.  Onset of benefits: Depends on the amount of swelling present. The more swelling, the longer it will take for the benefits to be seen. In some cases, up to 10 days.    Duration: Steroids will stay in the system x 2 weeks. Duration of benefits will depend on multiple posibilities including persistent irritating factors.  Side-effects: If present, they may typically last 2 weeks (the duration of the steroids).  Frequent: Cramps (if they occur, drink Gatorade and take over-the-counter Magnesium 450-500 mg  once to twice a day); water retention with temporary weight gain; increases in blood sugar; decreased immune system response; increased appetite.  Occasional: Facial flushing (red, warm cheeks); mood swings; menstrual changes.  Uncommon: Long-term decrease or suppression of natural hormones; bone thinning. (These are more common with higher doses or more frequent use. This is why we prefer that our patients avoid having any injection therapies in other practices.)   Very Rare: Severe mood changes; psychosis; aseptic necrosis.  From procedure: Some discomfort is to be expected once the numbing medicine wears off. This should be minimal if ice and heat are applied as instructed.  Call if: (When should I call?)  You experience numbness and weakness that gets worse with time, as opposed to wearing off.  New onset bowel or bladder incontinence. (Applies only to procedures done in the spine)  Emergency Numbers:  Durning business hours (Monday - Thursday, 8:00 AM - 4:00 PM) (Friday, 9:00 AM - 12:00 Noon): [phone]  After hours: [phone]  NOTE: If you are having a problem and are unable connect with, or to talk to a provider, then go to your nearest urgent care or emergency department. If the problem is serious and urgent, please call 911. ____________________________________________________________________________________________    

## 2022-02-17 NOTE — Progress Notes (Signed)
Patient's Name: Ryan [name]  MRN: [id number]  Referring Provider: Edward Jolly, MD  DOB: [birth year]  PCP: Smitty Cords, DO  DOS: 02/17/2022  Note by: Edward Jolly, MD  Service setting: Ambulatory outpatient  Specialty: Interventional Pain Management  Patient type: Established  Location: [facility] (AMB) Pain Management Facility  Visit type: Interventional Procedure   Primary Reason for Visit: Interventional Pain Management Treatment. CC: Back Pain (back)   Procedure:          Anesthesia, Analgesia, Anxiolysis:  Type: Lumbar Facet, Medial Branch Block(s) #1,  2024 Primary Purpose: Therapeutic Region: Posterolateral Lumbosacral Spine Level: L3, L4, L5,  Medial Branch Level(s). Injecting these levels blocks the L3-4, L4-5 lumbar facet joints. Laterality: Bilateral  Type: moderate sedation Route: Intravenous (IV) IV Access: Secured Sedation: Meaningful verbal contact was maintained at all times during the procedure  Local Anesthetic: Lidocaine 1-2%  Position: Prone   Indications: 1. Lumbar spondylosis   2. Lumbar facet arthropathy   3. Chronic pain syndrome    Pain Score: Pre-procedure: 5 /10 Post-procedure: 4 /10  Pre-op Assessment:  Ryan [name] is a 67 y.o. (year old), male patient, seen today for interventional treatment. He  has a past surgical history that includes Eye surgery; Hernia repair; Transforaminal lumbar interbody fusion (tlif) with pedicle screw fixation 1 level (Right, 06/18/2014); Myringotomy with tube placement (Bilateral, 03/28/2015); Back surgery; Esophagogastroduodenoscopy (egd) with propofol (N/A, 10/24/2015); Cataract extraction w/PHACO (Left, 01/06/2016); Tonsillectomy; Colonoscopy with propofol (N/A, 04/19/2016); and Sinusotomy (02/28/2019). Ryan [name] has a current medication list which includes the following prescription(s): albuterol, albuterol, alprazolam, aspirin ec, atorvastatin, fluticasone furoate-vilanterol, gabapentin, lisinopril, sildenafil,  tamsulosin, amoxicillin-clavulanate, and nicotine, and the following Facility-Administered Medications: fentanyl and lactated ringers. His primarily concern today is the Back Pain (back)   Initial Vital Signs:  Pulse/HCG Rate: 84ECG Heart Rate: 82 (NSR) Temp: 97.9 F (36.6 C) Resp: 17 BP: 138/89 SpO2: 97 %  BMI: Estimated body mass index is 27.41 kg/m as calculated from the following:   Height as of this encounter: 5\' 7"  (1.702 m).   Weight as of this encounter: 175 lb (79.4 kg).  Risk Assessment: Allergies: Reviewed. He has No Known Allergies.  Allergy Precautions: None required Coagulopathies: Reviewed. None identified.  Blood-thinner therapy: None at this time Active Infection(s): Reviewed. None identified. Ryan [name] is afebrile  Site Confirmation: Ryan [name] was asked to confirm the procedure and laterality before marking the site Procedure checklist: Completed Consent: Before the procedure and under the influence of no sedative(s), amnesic(s), or anxiolytics, the patient was informed of the treatment options, risks and possible complications. To fulfill our ethical and legal obligations, as recommended by the American Medical Association's Code of Ethics, I have informed the patient of my clinical impression; the nature and purpose of the treatment or procedure; the risks, benefits, and possible complications of the intervention; the alternatives, including doing nothing; the risk(s) and benefit(s) of the alternative treatment(s) or procedure(s); and the risk(s) and benefit(s) of doing nothing. The patient was provided information about the general risks and possible complications associated with the procedure. These may include, but are not limited to: failure to achieve desired goals, infection, bleeding, organ or nerve damage, allergic reactions, paralysis, and death. In addition, the patient was informed of those risks and complications associated to Spine-related procedures, such  as failure to decrease pain; infection (i.e.: Meningitis, epidural or intraspinal abscess); bleeding (i.e.: epidural hematoma, subarachnoid hemorrhage, or any other type of intraspinal or peri-dural bleeding); organ or nerve damage (i.e.:  Any type of peripheral nerve, nerve root, or spinal cord injury) with subsequent damage to sensory, motor, and/or autonomic systems, resulting in permanent pain, numbness, and/or weakness of one or several areas of the body; allergic reactions; (i.e.: anaphylactic reaction); and/or death. Furthermore, the patient was informed of those risks and complications associated with the medications. These include, but are not limited to: allergic reactions (i.e.: anaphylactic or anaphylactoid reaction(s)); adrenal axis suppression; blood sugar elevation that in diabetics may result in ketoacidosis or comma; water retention that in patients with history of congestive heart failure may result in shortness of breath, pulmonary edema, and decompensation with resultant heart failure; weight gain; swelling or edema; medication-induced neural toxicity; particulate matter embolism and blood vessel occlusion with resultant organ, and/or nervous system infarction; and/or aseptic necrosis of one or more joints. Finally, the patient was informed that Medicine is not an exact science; therefore, there is also the possibility of unforeseen or unpredictable risks and/or possible complications that may result in a catastrophic outcome. The patient indicated having understood very clearly. We have given the patient no guarantees and we have made no promises. Enough time was given to the patient to ask questions, all of which were answered to the patient's satisfaction. Ryan [name] has indicated that he wanted to continue with the procedure. Attestation: I, the ordering provider, attest that I have discussed with the patient the benefits, risks, side-effects, alternatives, likelihood of achieving goals, and  potential problems during recovery for the procedure that I have provided informed consent. Date  Time: 02/17/2022  7:53 AM  Pre-Procedure Preparation:  Monitoring: As per clinic protocol. Respiration, ETCO2, SpO2, BP, heart rate and rhythm monitor placed and checked for adequate function Safety Precautions: Patient was assessed for positional comfort and pressure points before starting the procedure. Time-out: I initiated and conducted the "Time-out" before starting the procedure, as per protocol. The patient was asked to participate by confirming the accuracy of the "Time Out" information. Verification of the correct person, site, and procedure were performed and confirmed by me, the nursing staff, and the patient. "Time-out" conducted as per Joint Commission's Universal Protocol (UP.01.01.01). Time: [id number]  Description of Procedure:          Laterality: Bilateral. The procedure was performed in identical fashion on both sides. Levels:  L3, L4, L5,  Medial Branch Level(s) Area Prepped: Posterior Lumbosacral Region Prepping solution: ChloraPrep (2% chlorhexidine gluconate and 70% isopropyl alcohol) Safety Precautions: Aspiration looking for blood return was conducted prior to all injections. At no point did we inject any substances, as a needle was being advanced. Before injecting, the patient was told to immediately notify me if he was experiencing any new onset of "ringing in the ears, or metallic taste in the mouth". No attempts were made at seeking any paresthesias. Safe injection practices and needle disposal techniques used. Medications properly checked for expiration dates. SDV (single dose vial) medications used. After the completion of the procedure, all disposable equipment used was discarded in the proper designated medical waste containers. Local Anesthesia: Protocol guidelines were followed. The patient was positioned over the fluoroscopy table. The area was prepped in the usual manner.  The time-out was completed. The target area was identified using fluoroscopy. A 12-in long, straight, sterile hemostat was used with fluoroscopic guidance to locate the targets for each level blocked. Once located, the skin was marked with an approved surgical skin marker. Once all sites were marked, the skin (epidermis, dermis, and hypodermis), as well as deeper tissues (fat,  connective tissue and muscle) were infiltrated with a Rodeheaver amount of a short-acting local anesthetic, loaded on a 10cc syringe with a 25G, 1.5-in  Needle. An appropriate amount of time was allowed for local anesthetics to take effect before proceeding to the next step. Local Anesthetic: Lidocaine 2.0% The unused portion of the local anesthetic was discarded in the proper designated containers. Technical explanation of process:   L3 Medial Branch Nerve Block (MBB): The target area for the L3 medial branch is at the junction of the postero-lateral aspect of the superior articular process and the superior, posterior, and medial edge of the transverse process of L4. Under fluoroscopic guidance, a Quincke needle was inserted until contact was made with os over the superior postero-lateral aspect of the pedicular shadow (target area). After negative aspiration for blood,1 cc  mL of the nerve block solution was injected without difficulty or complication. The needle was removed intact. L4 Medial Branch Nerve Block (MBB): The target area for the L4 medial branch is at the junction of the postero-lateral aspect of the superior articular process and the superior, posterior, and medial edge of the transverse process of L5. Under fluoroscopic guidance, a Quincke needle was inserted until contact was made with os over the superior postero-lateral aspect of the pedicular shadow (target area). After negative aspiration for blood,1cc mL of the nerve block solution was injected without difficulty or complication. The needle was removed intact. L5 Medial  Branch Nerve Block (MBB): The target area for the L5 medial branch is at the junction of the postero-lateral aspect of the superior articular process and the superior, posterior, and medial edge of the sacral ala. Under fluoroscopic guidance, a Quincke needle was inserted until contact was made with os over the superior postero-lateral aspect of the pedicular shadow (target area). After negative aspiration for blood,1cc  mL of the nerve block solution was injected without difficulty or complication. The needle was removed intact.   Nerve block solution: 10 cc solution made of 8 cc of 0.2% ropivacaine, 2 cc of Decadron 10 mg/cc.  1-1.5 cc injected at each level above bilaterally  Procedural Needles: 22-gauge, 3.5-inch, Quincke needles used for all levels.  Once the entire procedure was completed, the treated area was cleaned, making sure to leave some of the prepping solution back to take advantage of its long term bactericidal properties.   Illustration of the posterior view of the lumbar spine and the posterior neural structures. Laminae of L2 through S1 are labeled. DPRL5, dorsal primary ramus of L5; DPRS1, dorsal primary ramus of S1; DPR3, dorsal primary ramus of L3; FJ, facet (zygapophyseal) joint L3-L4; I, inferior articular process of L4; LB1, lateral branch of dorsal primary ramus of L1; IAB, inferior articular branches from L3 medial branch (supplies L4-L5 facet joint); IBP, intermediate branch plexus; MB3, medial branch of dorsal primary ramus of L3; NR3, third lumbar nerve root; S, superior articular process of L5; SAB, superior articular branches from L4 (supplies L4-5 facet joint also); TP3, transverse process of L3.  Vitals:   02/17/22 0850 02/17/22 0855 02/17/22 0905 02/17/22 0915  BP: (!) 133/113 (!) 141/85 (!) 135/91 (!) 149/94  Pulse:      Resp: 18 15 (!) 23 18  Temp:  98.1 F (36.7 C)  (!) 97.5 F (36.4 C)  TempSrc:  Temporal  Temporal  SpO2: 100% 96% 99% 99%  Weight:       Height:         Start Time: 0837 hrs. End Time:  0848 hrs.  Imaging Guidance (Spinal):          Type of Imaging Technique: Fluoroscopy Guidance (Spinal) Indication(s): Assistance in needle guidance and placement for procedures requiring needle placement in or near specific anatomical locations not easily accessible without such assistance. Exposure Time: Please see nurses notes. Contrast: None used. Fluoroscopic Guidance: I was personally present during the use of fluoroscopy. "Tunnel Vision Technique" used to obtain the best possible view of the target area. Parallax error corrected before commencing the procedure. "Direction-depth-direction" technique used to introduce the needle under continuous pulsed fluoroscopy. Once target was reached, antero-posterior, oblique, and lateral fluoroscopic projection used confirm needle placement in all planes. Images permanently stored in EMR. Interpretation: No contrast injected. I personally interpreted the imaging intraoperatively. Adequate needle placement confirmed in multiple planes. Permanent images saved into the patient's record.   Post-operative Assessment:  Post-procedure Vital Signs:  Pulse/HCG Rate: 8470 Temp: (!) 97.5 F (36.4 C) Resp: 18 BP: (!) 149/94 SpO2: 99 %  EBL: None  Complications: No immediate post-treatment complications observed by team, or reported by patient.  Note: The patient tolerated the entire procedure well. A repeat set of vitals were taken after the procedure and the patient was kept under observation following institutional policy, for this type of procedure. Post-procedural neurological assessment was performed, showing return to baseline, prior to discharge. The patient was provided with post-procedure discharge instructions, including a section on how to identify potential problems. Should any problems arise concerning this procedure, the patient was given instructions to immediately contact us, at any time,  without hesitation. In any case, we plan to contact the patient by telephone for a follow-up status report regarding this interventional procedure.  Comments:  No additional relevant information.  Plan of Care   I also reviewed the patient's thoracic and lumbar MRI with him in great detail.  We discussed spinal cord stimulation trial.  He states that he will think about this further and let us know.  He will need a psych evaluation for SCS clearance if he decides to proceed with 1.  Imaging Orders         DG PA[facility] C-ARM 1-60 MIN NO REPORT     Medications ordered for procedure: Meds ordered this encounter  Medications   lidocaine (XYLOCAINE) 2 % (with pres) injection 400 mg   lactated ringers infusion   midazolam (VERSED) 5 MG/5ML injection 0.5-2 mg    Make sure Flumazenil is available in the pyxis when using this medication. If oversedation occurs, administer 0.2 mg IV over 15 sec. If after 45 sec no response, administer 0.2 mg again over 1 min; may repeat at 1 min intervals; not to exceed 4 doses (1 mg)   fentaNYL (SUBLIMAZE) injection 25-50 mcg    Make sure Narcan is available in the pyxis when using this medication. In the event of respiratory depression (RR< 8/min): Titrate NARCAN (naloxone) in increments of 0.1 to 0.2 mg IV at 2-3 minute intervals, until desired degree of reversal.   ropivacaine (PF) 2 mg/mL (0.2%) (NAROPIN) injection 9 mL   ropivacaine (PF) 2 mg/mL (0.2%) (NAROPIN) injection 9 mL   dexamethasone (DECADRON) injection 10 mg   dexamethasone (DECADRON) injection 10 mg   Medications administered: We administered lidocaine, midazolam, fentaNYL, ropivacaine (PF) 2 mg/mL (0.2%), ropivacaine (PF) 2 mg/mL (0.2%), dexamethasone, and dexamethasone.  See the medical record for exact dosing, route, and time of administration.  Disposition: Discharge home  Discharge Date & Time: 02/17/2022; 0918 hrs.  Physician-requested Follow-up: Return in about 6 weeks (around  03/31/2022) for PPE VV.  Future Appointments  Date Time Provider [place]  03/31/2022  3:00 PM [name], MD [facility] None  05/21/2022  1:40 PM [place] Ranger Devonne Doughty, DO [facility]    Primary Care Physician: Olin Hauser, DO Location: [facility] Note by: [name], MD Date: 02/17/2022; Time: 9:28 AM  Disclaimer:  Medicine is not an exact science. The only guarantee in medicine is that nothing is guaranteed. It is important to note that the decision to proceed with this intervention was based on the information collected from the patient. The Data and conclusions were drawn from the patient's questionnaire, the interview, and the physical examination. Because the information was provided in large part by the patient, it cannot be guaranteed that it has not been purposely or unconsciously manipulated. Every effort has been made to obtain as much relevant data as possible for this evaluation. It is important to note that the conclusions that lead to this procedure are derived in large part from the available data. Always take into account that the treatment will also be dependent on availability of resources and existing treatment guidelines, considered by other Pain Management Practitioners as being common knowledge and practice, at the time of the intervention. For Medico-Legal purposes, it is also important to point out that variation in procedural techniques and pharmacological choices are the acceptable norm. The indications, contraindications, technique, and results of the above procedure should only be interpreted and judged by a Board-Certified Interventional Pain Specialist with extensive familiarity and expertise in the same exact procedure and technique.

## 2022-02-17 NOTE — Progress Notes (Signed)
Safety precautions to be maintained throughout the outpatient stay will include: orient to surroundings, keep bed in low position, maintain call bell within reach at all times, provide assistance with transfer out of bed and ambulation.  

## 2022-02-18 ENCOUNTER — Telehealth: Payer: Self-pay

## 2022-02-18 NOTE — Telephone Encounter (Signed)
Post procedure follow up.  Patient state he is doing good.

## 2022-03-01 ENCOUNTER — Telehealth: Payer: Self-pay | Admitting: Family Medicine

## 2022-03-01 NOTE — Telephone Encounter (Signed)
Called both number so file to sched AWV, they are not working.

## 2022-03-31 ENCOUNTER — Encounter: Payer: Self-pay | Admitting: Student in an Organized Health Care Education/Training Program

## 2022-03-31 ENCOUNTER — Ambulatory Visit
Payer: 59 | Attending: Student in an Organized Health Care Education/Training Program | Admitting: Student in an Organized Health Care Education/Training Program

## 2022-03-31 DIAGNOSIS — M5416 Radiculopathy, lumbar region: Secondary | ICD-10-CM | POA: Diagnosis not present

## 2022-03-31 DIAGNOSIS — M48062 Spinal stenosis, lumbar region with neurogenic claudication: Secondary | ICD-10-CM | POA: Diagnosis not present

## 2022-03-31 DIAGNOSIS — M47816 Spondylosis without myelopathy or radiculopathy, lumbar region: Secondary | ICD-10-CM

## 2022-03-31 DIAGNOSIS — G894 Chronic pain syndrome: Secondary | ICD-10-CM | POA: Diagnosis not present

## 2022-03-31 NOTE — Progress Notes (Signed)
Patient: Ryan [name]  Service Category: E/M  Provider: [name], MD  DOB: 30-Dec-1955  DOS: 03/31/2022  Location: Office  MRN: [id number]  Setting: Ambulatory outpatient  Referring Provider: Nobie Putnam *  Type: Established Patient  Specialty: Interventional Pain Management  PCP: Olin Hauser, DO  Location: Remote location  Delivery: TeleHealth     Virtual Encounter - Pain Management PROVIDER NOTE: Information contained herein reflects review and annotations entered in association with encounter. Interpretation of such information and data should be left to medically-trained personnel. Information provided to patient can be located elsewhere in the medical record under "Patient Instructions". Document created using STT-dictation technology, any transcriptional errors that may result from process are unintentional.    Contact & Pharmacy Preferred: [phone] Home: [phone] (home) Mobile: [phone] (mobile) E-mail: [email or web address]  [id number] - [address] ([address]), [place] - [place] ([place]) [place] [address] Phone: [phone] Fax: [phone]   Pre-screening  [name] offered "in-person" vs "virtual" encounter. He indicated preferring virtual for this encounter.   Reason COVID-19*  Social distancing based on CDC and AMA recommendations.   I contacted Ryan [name] on 03/31/2022 via telephone.      I clearly identified myself as 16/06/2022, MD. I verified that I was speaking with the correct person using two identifiers (Name: Ryan [name], and date of birth: [birth year]).  Consent I sought verbal advanced consent from 11/25/1955 for virtual visit interactions. I informed [name] of possible security and privacy concerns, risks, and limitations associated with providing "not-in-person" medical evaluation and management services. I also informed [name] of the availability of "in-person"  appointments. Finally, I informed him that there would be a charge for the virtual visit and that he could be  personally, fully or partially, financially responsible for it. [name] expressed understanding and agreed to proceed.   Historic Elements   [name] is a 67 y.o. year old, male patient evaluated today after our last contact on 02/17/2022. Mr. Men  has a past medical history of Anxiety, Arthritis, Asthma, Chronic kidney disease, COPD (chronic obstructive pulmonary disease) ([place]), Dyspnea, GERD (gastroesophageal reflux disease), Hyperlipidemia, Hypertension, Lower urinary tract symptoms (LUTS), Prostate disease, Stroke [facility]), Thyroid disease, Wears dentures, and Wears hearing aid. He also  has a past surgical history that includes Eye surgery; Hernia repair; Transforaminal lumbar interbody fusion (tlif) with pedicle screw fixation 1 level (Right, 06/18/2014); Myringotomy with tube placement (Bilateral, 03/28/2015); Back surgery; Esophagogastroduodenoscopy (egd) with propofol (N/A, 10/24/2015); Cataract extraction w/PHACO (Left, 01/06/2016); Tonsillectomy; Colonoscopy with propofol (N/A, 04/19/2016); and Sinusotomy (02/28/2019). Mr. Ryan [name] has a current medication list which includes the following prescription(s): albuterol, albuterol, alprazolam, aspirin ec, atorvastatin, fluticasone furoate-vilanterol, gabapentin, lisinopril, nicotine, sildenafil, tamsulosin, and amoxicillin-clavulanate. He  reports that he has been smoking cigarettes. He has a 60.00 pack-year smoking history. He uses smokeless tobacco. He reports current alcohol use. He reports current drug use. Drugs: Marijuana and "Crack" cocaine. [name] has No Known Allergies.  BMI: Estimated body mass index is 27.41 kg/m as calculated from the following:   Height as of 02/17/22: '5\' 7"'$  (1.702 m).   Weight as of 02/17/22: 175 lb (79.4 kg). Last encounter: 01/27/2022. Last procedure: 02/17/2022.  HPI  Today, he is being contacted for a  post-procedure assessment.   Post-procedure evaluation  Type: Lumbar Facet, Medial Branch Block(s) #1,  2024 Primary Purpose: Therapeutic Region: Posterolateral Lumbosacral Spine Level: L3, L4, L5,  Medial Branch  Level(s). Injecting these levels blocks the L3-4, L4-5 lumbar facet joints. Laterality: Bilateral Effectiveness:  Initial hour after procedure: 100 %  Subsequent 4-6 hours post-procedure: 50 %  Analgesia past initial 6 hours: 50 % (3-4 weeks)  Ongoing improvement:  Analgesic:  40% Function: Somewhat improved ROM: Somewhat improved   Laboratory Chemistry Profile   Renal Lab Results  Component Value Date   BUN 12 11/30/2021   CREATININE 0.75 11/30/2021   BCR SEE NOTE: 11/18/2021   GFRAA >60 09/11/2019   GFRNONAA >60 11/30/2021    Hepatic Lab Results  Component Value Date   AST 20 11/29/2021   ALT 14 11/29/2021   ALBUMIN 3.7 11/29/2021   ALKPHOS 119 11/29/2021   LIPASE 36 11/29/2021    Electrolytes Lab Results  Component Value Date   NA 137 11/30/2021   K 3.5 11/30/2021   CL 104 11/30/2021   CALCIUM 8.9 11/30/2021   MG 1.9 11/27/2021   PHOS 4.3 11/27/2021    Bone Lab Results  Component Value Date   TESTOSTERONE 473 02/02/2021    Inflammation (CRP: Acute Phase) (ESR: Chronic Phase) Lab Results  Component Value Date   LATICACIDVEN 2.7 ([place]) 11/29/2021         Note: Above Lab results reviewed.  CLINICAL DATA:  Low back pain with symptoms persisting over 6 weeks. Mid to low back pain for years extending to both legs.   EXAM: MRI THORACIC AND LUMBAR SPINE WITHOUT CONTRAST   TECHNIQUE: Multiplanar and multiecho pulse sequences of the thoracic and lumbar spine were obtained without intravenous contrast.   COMPARISON:  Lumbar MRI 01/10/2018   FINDINGS: MRI THORACIC SPINE FINDINGS   Alignment:  Physiologic.   Vertebrae: No fracture, evidence of discitis, or bone lesion.   Cord:  Normal signal and morphology.   Paraspinal and other soft  tissues: Negative for perispinal mass or inflammatory change. Dermal inclusion cyst with visible channel extending to the skin in the posterior subcutaneous fat at the level of T5   Disc levels:   Generalized disc desiccation and narrowing with Hodgdon ventral endplate spurs. Tiny disc protrusions seen paracentral at T6-7, right paracentral and left foraminal at T9-10. Mild disc bulging at T10-11 and below. No notable or focal facet spurring.   Disc collapse and endplate degeneration with uncovertebral ridging at the C7-T1 level causes biforaminal impingement.   MRI LUMBAR SPINE FINDINGS   Segmentation:  Standard.   Alignment: Reversal of lumbar lordosis with anterolisthesis at L3-4. Mild dextrocurvature.   Vertebrae: Discogenic endplate edema at X33443 and to a lesser extent at T12-L1. Posterior fusion hardware at L5-S1.   Conus medullaris and cauda equina: Conus extends to the L1 level. Conus and cauda equina appear normal.   Paraspinal and other soft tissues: Expected postoperative scarring and lower lumbar posterior soft tissues.   Disc levels:   T12- L1: Disc space narrowing and bulging.  No neural impingement   L1-L2: Unremarkable.   L2-L3: Disc collapse with endplate degeneration eccentric towards the right. Circumferential endplate spurring and bilateral facet hypertrophy. Moderate spinal stenosis with cauda equina compression. Moderate right foraminal impingement.   L3-L4: Advanced facet degeneration with spurring and anterolisthesis. Disc collapse and endplate degeneration eccentric towards the left. Advanced spinal and left foraminal stenosis.   L4-L5: Disc collapse with endplate ridging narrowing the foramina. Mild facet spurring. The canal and foramina are patent   L5-S1:PLIF. Right eccentric cage but not extending to the foramen based on 2023 abdominal CT. There is cage subsidence and residual  right disc space narrowing with right foraminal impingement.    IMPRESSION: MR THORACIC SPINE IMPRESSION   1. Ordinary degenerative change without impingement in the thoracic spine. 2. Notable disc degeneration at C7-T1 causing biforaminal impingement.   MR LUMBAR SPINE IMPRESSION   1. Advanced lumbar spine degeneration with scoliosis and L3-4 anterolisthesis. Degeneration has progressed from 2019 and there is notable discogenic endplate edema leftward at L3-4. 2. Spinal stenosis that is advanced at L3-4 and moderate at L2-3. 3. Advanced foraminal impingement on the left at L3-4 and right at L5-S1. Moderate foraminal stenosis on the right at L2-3     Electronically Signed   By: Jorje Guild M.D.   On: 02/11/2022 04:55  Assessment  The primary encounter diagnosis was Lumbar spondylosis. Diagnoses of Lumbar facet arthropathy, Chronic pain syndrome, Spinal stenosis, lumbar region, with neurogenic claudication, and Lumbar radicular pain were also pertinent to this visit.  Plan of Care  Increased right lumbar radicular pain, L-MRI shows L2-L3: Disc collapse with endplate degeneration eccentric towards the right. Circumferential endplate spurring and bilateral facet hypertrophy. Moderate spinal stenosis with cauda equina compression. Moderate right foraminal impingement. Discussed L-ESI for management. Also discussed spinal cord stimulation which the patient is interested in. He is going to reach out to his psychiatrist to see if she can do his psych eval for SCS clearance. If she can't, he will inform me and I'll place referral to [place] for psych eval for SCS clearance.  1. Lumbar spondylosis  2. Lumbar facet arthropathy  3. Chronic pain syndrome - Lumbar Epidural Injection; Future  4. Spinal stenosis, lumbar region, with neurogenic claudication - Lumbar Epidural Injection; Future  5. Lumbar radicular pain - Lumbar Epidural Injection; Future  Orders Placed This Encounter  Procedures   Lumbar Epidural Injection    Standing Status:    Future    Standing Expiration Date:   07/01/2022    Scheduling Instructions:     Procedure: Interlaminar Lumbar Epidural Steroid injection (LESI)            Laterality: Right L2-3     Sedation: IV Versed     Timeframe: ASAA    Order Specific Question:   Where will this procedure be performed?    Answer:   [facility]  '    Follow-up plan:   Return in about 2 weeks (around 04/14/2022) for Right L2/3 ESI, in clinic IV Versed.      Status post bilateral L3, L4, L5, S1 lumbar facet medial branch nerve blocks on 03/20/2018 helped 85% pain relief for approximately 1 year. Status post left L3, L4, L5 RFA on 07/11/2019, Right L3, L4, L5 RFA 07/25/19. Not as helpful as lumbar facet MBBs- repeat PRN, consider SPRINT PNS                  Recent Visits Date Type Provider Dept  02/17/22 Procedure visit [name], MD [facility]  01/27/22 Office Visit [name], MD [facility]  Showing recent visits within past 90 days and meeting all other requirements Today's Visits Date Type Provider Dept  03/31/22 Office Visit [name], MD [facility]  Showing today's visits and meeting all other requirements Future Appointments No visits were found meeting these conditions. Showing future appointments within next 90 days and meeting all other requirements  I discussed the assessment and treatment plan with the patient. The patient was provided an opportunity to ask questions and all were answered. The patient agreed with the  plan and demonstrated an understanding of the instructions.  Patient advised to call back or seek an in-person evaluation if the symptoms or condition worsens.  Duration of encounter: 42mnutes.  Note by: BGillis Santa MD Date: 03/31/2022; Time: 3:20 PM

## 2022-04-21 ENCOUNTER — Ambulatory Visit
Admission: RE | Admit: 2022-04-21 | Discharge: 2022-04-21 | Disposition: A | Discharge status: Home / Self Care | Payer: 59 | Source: Ambulatory Visit | Attending: Student in an Organized Health Care Education/Training Program | Admitting: Student in an Organized Health Care Education/Training Program

## 2022-04-21 ENCOUNTER — Ambulatory Visit
Payer: 59 | Attending: Student in an Organized Health Care Education/Training Program | Admitting: Student in an Organized Health Care Education/Training Program

## 2022-04-21 ENCOUNTER — Encounter: Payer: Self-pay | Admitting: Student in an Organized Health Care Education/Training Program

## 2022-04-21 DIAGNOSIS — M48062 Spinal stenosis, lumbar region with neurogenic claudication: Secondary | ICD-10-CM | POA: Insufficient documentation

## 2022-04-21 DIAGNOSIS — G894 Chronic pain syndrome: Secondary | ICD-10-CM | POA: Diagnosis not present

## 2022-04-21 DIAGNOSIS — M5416 Radiculopathy, lumbar region: Secondary | ICD-10-CM | POA: Diagnosis not present

## 2022-04-21 MED ORDER — LACTATED RINGERS IV SOLN: Freq: Once | INTRAVENOUS | Status: AC

## 2022-04-21 MED ORDER — LIDOCAINE HCL 2 % IJ SOLN
20.0000 mL | Freq: Once | INTRAMUSCULAR | Status: AC
  Administered 2022-04-21: 200 mg
  Filled 2022-04-21: qty 20

## 2022-04-21 MED ORDER — DEXAMETHASONE SODIUM PHOSPHATE 10 MG/ML IJ SOLN
10.0000 mg | Freq: Once | INTRAMUSCULAR | Status: AC
  Administered 2022-04-21: 10 mg
  Filled 2022-04-21: qty 1

## 2022-04-21 MED ORDER — DEXAMETHASONE SODIUM PHOSPHATE 10 MG/ML IJ SOLN
INTRAMUSCULAR | Status: AC
  Filled 2022-04-21: qty 1

## 2022-04-21 MED ORDER — ROPIVACAINE HCL 2 MG/ML IJ SOLN
2.0000 mL | Freq: Once | INTRAMUSCULAR | Status: AC
  Administered 2022-04-21: 2 mL via EPIDURAL
  Filled 2022-04-21: qty 20

## 2022-04-21 MED ORDER — MIDAZOLAM HCL 2 MG/2ML IJ SOLN
0.5000 mg | Freq: Once | INTRAMUSCULAR | Status: AC
  Administered 2022-04-21: 2 mg via INTRAVENOUS
  Filled 2022-04-21: qty 2

## 2022-04-21 MED ORDER — SODIUM CHLORIDE 0.9% FLUSH
2.0000 mL | Freq: Once | INTRAVENOUS | Status: AC
  Administered 2022-04-21: 2 mL

## 2022-04-21 MED ORDER — SODIUM CHLORIDE (PF) 0.9 % IJ SOLN
INTRAMUSCULAR | Status: AC
  Filled 2022-04-21: qty 10

## 2022-04-21 MED ORDER — IOHEXOL 180 MG/ML  SOLN
10.0000 mL | Freq: Once | INTRAMUSCULAR | Status: AC
  Administered 2022-04-21: 10 mL via EPIDURAL
  Filled 2022-04-21: qty 20

## 2022-04-21 NOTE — Progress Notes (Signed)
PROVIDER NOTE: Interpretation of information contained herein should be left to medically-trained personnel. Specific patient instructions are provided elsewhere under "Patient Instructions" section of medical record. This document was created in part using STT-dictation technology, any transcriptional errors that may result from this process are unintentional.  Patient: Ryan [name] Type: Established DOB: 1955/11/05 MRN: [id number] PCP: Olin Hauser, DO  Service: Procedure DOS: 04/21/2022 Setting: Ambulatory Location: Ambulatory outpatient facility Delivery: Face-to-face Provider: [name], MD Specialty: Interventional Pain Management Specialty designation: 09 Location: Outpatient facility Ref. Prov.: [name], MD       Interventional Therapy   Procedure: Lumbar epidural steroid injection (LESI) (interlaminar) #1    Laterality: Right   Level:  L2-3 Level.  Imaging: Fluoroscopic guidance         Anesthesia: Local anesthesia (1-2% Lidocaine) Anxiolysis:IV Versed DOS: 04/21/2022  Performed by: [name], MD  Purpose: Diagnostic/Therapeutic Indications: Lumbar radicular pain of intraspinal etiology of more than 4 weeks that has failed to respond to conservative therapy and is severe enough to impact quality of life or function. 1. Chronic pain syndrome   2. Spinal stenosis, lumbar region, with neurogenic claudication   3. Lumbar radicular pain    NAS-11 Pain score:   Pre-procedure: 5 /10   Post-procedure: 0-No pain/10      Position / Prep / Materials:  Position: Prone w/ head of the table raised (slight reverse trendelenburg) to facilitate breathing.  Prep solution: DuraPrep (Iodine Povacrylex [0.7% available iodine] and Isopropyl Alcohol, 74% w/w) Prep Area: Entire Posterior Lumbar Region from lower scapular tip down to mid buttocks area and from flank to flank. Materials:  Tray: Epidural tray Needle(s):  Type: Epidural needle (Tuohy) Gauge (G):   22 Length: Regular (3.5-in) Qty: 1   Pre-op H&P Assessment:  Ryan [name] is a 67 y.o. (year old), male patient, seen today for interventional treatment. He  has a past surgical history that includes Eye surgery; Hernia repair; Transforaminal lumbar interbody fusion (tlif) with pedicle screw fixation 1 level (Right, 06/18/2014); Myringotomy with tube placement (Bilateral, 03/28/2015); Back surgery; Esophagogastroduodenoscopy (egd) with propofol (N/A, 10/24/2015); Cataract extraction w/PHACO (Left, 01/06/2016); Tonsillectomy; Colonoscopy with propofol (N/A, 04/19/2016); and Sinusotomy (02/28/2019). Ryan [name] has a current medication list which includes the following prescription(s): albuterol, albuterol, alprazolam, aspirin ec, atorvastatin, fluticasone furoate-vilanterol, gabapentin, lisinopril, nicotine, sildenafil, tamsulosin, and amoxicillin-clavulanate, and the following Facility-Administered Medications: lactated ringers. His primarily concern today is the Back Pain (lower)  Initial Vital Signs:  Pulse/HCG Rate: 70ECG Heart Rate: 67 Temp: (!) 97.3 F (36.3 C) Resp: 16 BP: 113/78 SpO2: 100 %  BMI: Estimated body mass index is 24.75 kg/m as calculated from the following:   Height as of this encounter: 5\' 7"  (1.702 m).   Weight as of this encounter: 158 lb (71.7 kg).  Risk Assessment: Allergies: Reviewed. He has No Known Allergies.  Allergy Precautions: None required Coagulopathies: Reviewed. None identified.  Blood-thinner therapy: None at this time Active Infection(s): Reviewed. None identified. Ryan [name] is afebrile  Site Confirmation: Ryan [name] was asked to confirm the procedure and laterality before marking the site Procedure checklist: Completed Consent: Before the procedure and under the influence of no sedative(s), amnesic(s), or anxiolytics, the patient was informed of the treatment options, risks and possible complications. To fulfill our ethical and legal obligations, as  recommended by the American Medical Association's Code of Ethics, I have informed the patient of my clinical impression; the nature and purpose of the treatment or procedure; the risks, benefits, and possible  complications of the intervention; the alternatives, including doing nothing; the risk(s) and benefit(s) of the alternative treatment(s) or procedure(s); and the risk(s) and benefit(s) of doing nothing. The patient was provided information about the general risks and possible complications associated with the procedure. These may include, but are not limited to: failure to achieve desired goals, infection, bleeding, organ or nerve damage, allergic reactions, paralysis, and death. In addition, the patient was informed of those risks and complications associated to Spine-related procedures, such as failure to decrease pain; infection (i.e.: Meningitis, epidural or intraspinal abscess); bleeding (i.e.: epidural hematoma, subarachnoid hemorrhage, or any other type of intraspinal or peri-dural bleeding); organ or nerve damage (i.e.: Any type of peripheral nerve, nerve root, or spinal cord injury) with subsequent damage to sensory, motor, and/or autonomic systems, resulting in permanent pain, numbness, and/or weakness of one or several areas of the body; allergic reactions; (i.e.: anaphylactic reaction); and/or death. Furthermore, the patient was informed of those risks and complications associated with the medications. These include, but are not limited to: allergic reactions (i.e.: anaphylactic or anaphylactoid reaction(s)); adrenal axis suppression; blood sugar elevation that in diabetics may result in ketoacidosis or comma; water retention that in patients with history of congestive heart failure may result in shortness of breath, pulmonary edema, and decompensation with resultant heart failure; weight gain; swelling or edema; medication-induced neural toxicity; particulate matter embolism and blood vessel  occlusion with resultant organ, and/or nervous system infarction; and/or aseptic necrosis of one or more joints. Finally, the patient was informed that Medicine is not an exact science; therefore, there is also the possibility of unforeseen or unpredictable risks and/or possible complications that may result in a catastrophic outcome. The patient indicated having understood very clearly. We have given the patient no guarantees and we have made no promises. Enough time was given to the patient to ask questions, all of which were answered to the patient's satisfaction. Ryan [name] has indicated that he wanted to continue with the procedure. Attestation: I, the ordering provider, attest that I have discussed with the patient the benefits, risks, side-effects, alternatives, likelihood of achieving goals, and potential problems during recovery for the procedure that I have provided informed consent. Date  Time: 04/21/2022  1:29 PM   Pre-Procedure Preparation:  Monitoring: As per clinic protocol. Respiration, ETCO2, SpO2, BP, heart rate and rhythm monitor placed and checked for adequate function Safety Precautions: Patient was assessed for positional comfort and pressure points before starting the procedure. Time-out: I initiated and conducted the "Time-out" before starting the procedure, as per protocol. The patient was asked to participate by confirming the accuracy of the "Time Out" information. Verification of the correct person, site, and procedure were performed and confirmed by me, the nursing staff, and the patient. "Time-out" conducted as per Joint Commission's Universal Protocol (UP.01.01.01). Time:   Start Time:   hrs.  Description/Narrative of Procedure:          Target: Epidural space via interlaminar opening, initially targeting the lower laminar border of the superior vertebral body. Region: Lumbar Approach: Percutaneous paravertebral  Rationale (medical necessity): procedure needed and  proper for the diagnosis and/or treatment of the patient's medical symptoms and needs. Procedural Technique Safety Precautions: Aspiration looking for blood return was conducted prior to all injections. At no point did we inject any substances, as a needle was being advanced. No attempts were made at seeking any paresthesias. Safe injection practices and needle disposal techniques used. Medications properly checked for expiration dates. SDV (single dose vial)  medications used. Description of the Procedure: Protocol guidelines were followed. The procedure needle was introduced through the skin, ipsilateral to the reported pain, and advanced to the target area. Bone was contacted and the needle walked caudad, until the lamina was cleared. The epidural space was identified using "loss-of-resistance technique" with 2-3 ml of PF-NaCl (0.9% NSS), in a 5cc LOR glass syringe.  6 cc solution made of 3 cc of preservative-free saline, 2 cc of 0.2% ropivacaine, 1 cc of Decadron 10 mg/cc.   Vitals:   04/21/22 1443 04/21/22 1448 04/21/22 1453 04/21/22 1457  BP: 132/83 124/85 134/87 (!) 119/92  Pulse:    69  Resp: (!) 22 (!) 22 17 16   Temp:    (!) 97 F (36.1 C)  TempSrc:      SpO2: 98% 100% 97% 99%  Weight:      Height:        Start Time:   hrs. End Time:   hrs.  Imaging Guidance (Spinal):          Type of Imaging Technique: Fluoroscopy Guidance (Spinal) Indication(s): Assistance in needle guidance and placement for procedures requiring needle placement in or near specific anatomical locations not easily accessible without such assistance. Exposure Time: Please see nurses notes. Contrast: Before injecting any contrast, we confirmed that the patient did not have an allergy to iodine, shellfish, or radiological contrast. Once satisfactory needle placement was completed at the desired level, radiological contrast was injected. Contrast injected under live fluoroscopy. No contrast complications. See chart  for type and volume of contrast used. Fluoroscopic Guidance: I was personally present during the use of fluoroscopy. "Tunnel Vision Technique" used to obtain the best possible view of the target area. Parallax error corrected before commencing the procedure. "Direction-depth-direction" technique used to introduce the needle under continuous pulsed fluoroscopy. Once target was reached, antero-posterior, oblique, and lateral fluoroscopic projection used confirm needle placement in all planes. Images permanently stored in EMR. Interpretation: I personally interpreted the imaging intraoperatively. Adequate needle placement confirmed in multiple planes. Appropriate spread of contrast into desired area was observed. No evidence of afferent or efferent intravascular uptake. No intrathecal or subarachnoid spread observed. Permanent images saved into the patient's record.  Antibiotic Prophylaxis:   Anti-infectives (From admission, onward)    None      Indication(s): None identified  Post-operative Assessment:  Post-procedure Vital Signs:  Pulse/HCG Rate: 6964 Temp: (!) 97 F (36.1 C) Resp: 16 BP: (!) 119/92 SpO2: 99 %  EBL: None  Complications: No immediate post-treatment complications observed by team, or reported by patient.  Note: The patient tolerated the entire procedure well. A repeat set of vitals were taken after the procedure and the patient was kept under observation following institutional policy, for this type of procedure. Post-procedural neurological assessment was performed, showing return to baseline, prior to discharge. The patient was provided with post-procedure discharge instructions, including a section on how to identify potential problems. Should any problems arise concerning this procedure, the patient was given instructions to immediately contact us, at any time, without hesitation. In any case, we plan to contact the patient by telephone for a follow-up status report  regarding this interventional procedure.  Comments:  No additional relevant information.  Plan of Care (POC)  Orders:  Orders Placed This Encounter  Procedures   DG PAIN CLINIC C-ARM 1-60 MIN NO REPORT    Intraoperative interpretation by procedural physician at [place].    Standing Status:   Standing    Number  of Occurrences:   1    Order Specific Question:   Reason for exam:    Answer:   Assistance in needle guidance and placement for procedures requiring needle placement in or near specific anatomical locations not easily accessible without such assistance.     Medications ordered for procedure: Meds ordered this encounter  Medications   iohexol (OMNIPAQUE) 180 MG/ML injection 10 mL    Must be Myelogram-compatible. If not available, you may substitute with a water-soluble, non-ionic, hypoallergenic, myelogram-compatible radiological contrast medium.   lidocaine (XYLOCAINE) 2 % (with pres) injection 400 mg   sodium chloride flush (NS) 0.9 % injection 2 mL   ropivacaine (PF) 2 mg/mL (0.2%) (NAROPIN) injection 2 mL   dexamethasone (DECADRON) injection 10 mg   lactated ringers infusion   midazolam (VERSED) injection 0.5-2 mg    Make sure Flumazenil is available in the pyxis when using this medication. If oversedation occurs, administer 0.2 mg IV over 15 sec. If after 45 sec no response, administer 0.2 mg again over 1 min; may repeat at 1 min intervals; not to exceed 4 doses (1 mg)   Medications administered: We administered iohexol, lidocaine, sodium chloride flush, ropivacaine (PF) 2 mg/mL (0.2%), dexamethasone, lactated ringers, and midazolam.  See the medical record for exact dosing, route, and time of administration.  Follow-up plan:   Return in about 3 weeks (around 05/12/2022), or PPE F2F/discuss SCS.       Recent Visits Date Type Provider Dept  03/31/22 Office Visit [name], MD [facility]  02/17/22 Procedure visit [name], MD  [facility]  01/27/22 Office Visit [name], MD [facility]  Showing recent visits within past 90 days and meeting all other requirements Today's Visits Date Type Provider Dept  04/21/22 Procedure visit [name], MD [facility]  Showing today's visits and meeting all other requirements Future Appointments Date Type Provider Dept  05/24/22 Appointment [name], MD [facility]  Showing future appointments within next 90 days and meeting all other requirements  Disposition: Discharge home  Discharge (Date  Time): 04/21/2022; 1459 hrs.   Primary Care Physician: Olin Hauser, DO Location: [facility] Note by: [name], MD (TTS technology used. I apologize for any typographical errors that were not detected and corrected.) Date: 04/21/2022; Time: 3:15 PM  Disclaimer:  Medicine is not an Chief Strategy Officer. The only guarantee in medicine is that nothing is guaranteed. It is important to note that the decision to proceed with this intervention was based on the information collected from the patient. The Data and conclusions were drawn from the patient's questionnaire, the interview, and the physical examination. Because the information was provided in large part by the patient, it cannot be guaranteed that it has not been purposely or unconsciously manipulated. Every effort has been made to obtain as much relevant data as possible for this evaluation. It is important to note that the conclusions that lead to this procedure are derived in large part from the available data. Always take into account that the treatment will also be dependent on availability of resources and existing treatment guidelines, considered by other Pain Management Practitioners as being common knowledge and practice, at the time of the intervention. For Medico-Legal purposes, it is also important to point out that variation in procedural  techniques and pharmacological choices are the acceptable norm. The indications, contraindications, technique, and results of the above procedure should only be interpreted and judged by a  Board-Certified Interventional Pain Specialist with extensive familiarity and expertise in the same exact procedure and technique.

## 2022-04-21 NOTE — Patient Instructions (Signed)
____________________________________________________________________________________________  Post-Procedure Discharge Instructions  Instructions:  Apply ice:   Purpose: This will minimize any swelling and discomfort after procedure.   When: Day of procedure, as soon as you get home.  How: Fill a plastic sandwich bag with crushed ice. Cover it with a [place] towel and apply to injection site.  How long: (15 min on, 15 min off) Apply for 15 minutes then remove x 15 minutes.  Repeat sequence on day of procedure, until you go to bed.  Apply heat:   Purpose: To treat any soreness and discomfort from the procedure.  When: Starting the next day after the procedure.  How: Apply heat to procedure site starting the day following the procedure.  How long: May continue to repeat daily, until discomfort goes away.  Food intake: Start with clear liquids (like water) and advance to regular food, as tolerated.   Physical activities: Keep activities to a minimum for the first 8 hours after the procedure. After that, then as tolerated.  Driving: If you have received any sedation, be responsible and do not drive. You are not allowed to drive for 24 hours after having sedation.  Blood thinner: (Applies only to those taking blood thinners) You may restart your blood thinner 6 hours after your procedure.  Insulin: (Applies only to Diabetic patients taking insulin) As soon as you can eat, you may resume your normal dosing schedule.  Infection prevention: Keep procedure site clean and dry. Shower daily and clean area with soap and water.  Post-procedure Pain Diary: Extremely important that this be done correctly and accurately. Recorded information will be used to determine the next step in treatment. For the purpose of accuracy, follow these rules:  Evaluate only the area treated. Do not report or include pain from an untreated area. For the purpose of this evaluation, ignore all other areas of pain,  except for the treated area.  After your procedure, avoid taking a long nap and attempting to complete the pain diary after you wake up. Instead, set your alarm clock to go off every hour, on the hour, for the initial 8 hours after the procedure. Document the duration of the numbing medicine, and the relief you are getting from it.  Do not go to sleep and attempt to complete it later. It will not be accurate. If you received sedation, it is likely that you were given a medication that may cause amnesia. Because of this, completing the diary at a later time may cause the information to be inaccurate. This information is needed to plan your care.  Follow-up appointment: Keep your post-procedure follow-up evaluation appointment after the procedure (usually 2 weeks for most procedures, 6 weeks for radiofrequencies). DO NOT FORGET to bring you pain diary with you.   Expect: (What should I expect to see with my procedure?)  From numbing medicine (AKA: Local Anesthetics): Numbness or decrease in pain. You may also experience some weakness, which if present, could last for the duration of the local anesthetic.  Onset: Full effect within 15 minutes of injected.  Duration: It will depend on the type of local anesthetic used. On the average, 1 to 8 hours.   From steroids (Applies only if steroids were used): Decrease in swelling or inflammation. Once inflammation is improved, relief of the pain will follow.  Onset of benefits: Depends on the amount of swelling present. The more swelling, the longer it will take for the benefits to be seen. In some cases, up to 10 days.    Duration: Steroids will stay in the system x 2 weeks. Duration of benefits will depend on multiple posibilities including persistent irritating factors.  Side-effects: If present, they may typically last 2 weeks (the duration of the steroids).  Frequent: Cramps (if they occur, drink Gatorade and take over-the-counter Magnesium 450-500 mg  once to twice a day); water retention with temporary weight gain; increases in blood sugar; decreased immune system response; increased appetite.  Occasional: Facial flushing (red, warm cheeks); mood swings; menstrual changes.  Uncommon: Long-term decrease or suppression of natural hormones; bone thinning. (These are more common with higher doses or more frequent use. This is why we prefer that our patients avoid having any injection therapies in other practices.)   Very Rare: Severe mood changes; psychosis; aseptic necrosis.  From procedure: Some discomfort is to be expected once the numbing medicine wears off. This should be minimal if ice and heat are applied as instructed.  Call if: (When should I call?)  You experience numbness and weakness that gets worse with time, as opposed to wearing off.  New onset bowel or bladder incontinence. (Applies only to procedures done in the spine)  Emergency Numbers:  Durning business hours (Monday - Thursday, 8:00 AM - 4:00 PM) (Friday, 9:00 AM - 12:00 Noon): [phone]  After hours: [phone]  NOTE: If you are having a problem and are unable connect with, or to talk to a provider, then go to your nearest urgent care or emergency department. If the problem is serious and urgent, please call 911. ____________________________________________________________________________________________    

## 2022-04-22 ENCOUNTER — Telehealth: Payer: Self-pay

## 2022-04-22 NOTE — Telephone Encounter (Signed)
Attempt call to patient to discuss post-procedure. No answer and voicemail box not set up.   Al Decant, RN

## 2022-05-21 ENCOUNTER — Ambulatory Visit: Payer: Medicare Other | Admitting: Family Medicine

## 2022-05-24 ENCOUNTER — Ambulatory Visit: Payer: 59 | Admitting: Student in an Organized Health Care Education/Training Program

## 2022-06-07 ENCOUNTER — Telehealth: Payer: Self-pay | Admitting: Family Medicine

## 2022-06-07 NOTE — Telephone Encounter (Signed)
Contacted Ryan [name] to schedule their annual wellness visit. Appointment made for 06/25/2022.  Verlee Rossetti; Care Guide Ambulatory Clinical Support [place] [facility] Direct Dial: [phone]

## 2022-06-25 ENCOUNTER — Ambulatory Visit (INDEPENDENT_AMBULATORY_CARE_PROVIDER_SITE_OTHER): Payer: 59

## 2022-06-25 VITALS — Ht 67.0 in | Wt 167.0 lb

## 2022-06-25 DIAGNOSIS — Z0000 Encounter for general adult medical examination without abnormal findings: Secondary | ICD-10-CM | POA: Diagnosis not present

## 2022-06-25 NOTE — Progress Notes (Signed)
I connected with  Ryan [name] on 06/25/22 by a audio enabled telemedicine application and verified that I am speaking with the correct person using two identifiers.  Patient Location: Home  Provider Location: Office/Clinic  I discussed the limitations of evaluation and management by telemedicine. The patient expressed understanding and agreed to proceed.  Subjective:   Ryan [name] is a 67 y.o. male who presents for Medicare Annual/Subsequent preventive examination.  Review of Systems     Cardiac Risk Factors include: advanced age (>35men, >67 women);hypertension;male gender     Objective:    Today's Vitals   06/25/22 1305  PainSc: 5    There is no height or weight on file to calculate BMI.     06/25/2022    1:10 PM 04/21/2022    1:46 PM 02/17/2022    8:04 AM 12/09/2021    8:40 AM 11/29/2021    9:42 AM 11/27/2021    6:12 PM 11/27/2021    8:34 AM  Advanced Directives  Does Patient Have a Medical Advance Directive? No No No No No No No  Would patient like information on creating a medical advance directive? No - Patient declined  No - Patient declined No - Patient declined No - Patient declined No - Patient declined     Current Medications (verified) Outpatient Encounter Medications as of 06/25/2022  Medication Sig   albuterol (PROVENTIL) (2.5 MG/3ML) 0.083% nebulizer solution Take 3 mLs (2.5 mg total) by nebulization every 6 (six) hours as needed for wheezing or shortness of breath.   albuterol (VENTOLIN HFA) 108 (90 Base) MCG/ACT inhaler Inhale 2 puffs into the lungs every 6 (six) hours as needed for wheezing or shortness of breath.   ALPRAZolam (XANAX) 1 MG tablet Take 1 tablet (1 mg total) by mouth 2 (two) times daily as needed.   atorvastatin (LIPITOR) 80 MG tablet Take 1 tablet (80 mg total) by mouth at bedtime.   gabapentin (NEURONTIN) 400 MG capsule Take 400 mg by mouth in the morning and at bedtime. 400 mg twice a day as per patient --psychiatrist changed that Normally  takes 400 mg daily   lisinopril (ZESTRIL) 20 MG tablet Take 1 tablet (20 mg total) by mouth daily.   sildenafil (REVATIO) 20 MG tablet Take 4-5 tablets (80-100 mg total) by mouth as needed (ED).   tamsulosin (FLOMAX) 0.4 MG CAPS capsule Take 1 capsule (0.4 mg total) by mouth in the morning and at bedtime.   amoxicillin-clavulanate (AUGMENTIN) 875-125 MG tablet Take 1 tablet by mouth 2 (two) times daily. (Patient not taking: Reported on 02/17/2022)   aspirin EC 81 MG EC tablet Take 1 tablet (81 mg total) by mouth daily. Swallow whole. (Patient not taking: Reported on 06/25/2022)   fluticasone furoate-vilanterol (BREO ELLIPTA) 200-25 MCG/ACT AEPB Inhale 1 puff into the lungs daily. (Patient not taking: Reported on 06/25/2022)   nicotine (NICODERM CQ - DOSED IN MG/24 HOURS) 21 mg/24hr patch Place 1 patch (21 mg total) onto the skin daily. (Patient not taking: Reported on 06/25/2022)   No facility-administered encounter medications on file as of 06/25/2022.    Allergies (verified) Patient has no known allergies.   History: Past Medical History:  Diagnosis Date   Anxiety    Arthritis    left hand   Asthma    Chronic kidney disease    HAS HAD KIDNEY STONE 2015-- JUST PASSED   COPD (chronic obstructive pulmonary disease) (HCC)    Dyspnea    GERD (gastroesophageal reflux disease)  TAKES TUMS FOR RELIEF   Hyperlipidemia    Hypertension    Improved, No longer on meds   Lower urinary tract symptoms (LUTS)    Prostate disease    Stroke [facility])    Thyroid disease    Wears dentures    hass full upper plate, not wearing, broken   Wears hearing aid    Past Surgical History:  Procedure Laterality Date   BACK SURGERY     CATARACT EXTRACTION W/PHACO Left 01/06/2016   Procedure: CATARACT EXTRACTION PHACO AND INTRAOCULAR LENS PLACEMENT (IOC);  Surgeon: Nevada Crane, MD;  Location[facility];  Service: Ophthalmology;  Laterality: Left;  LEFT   COLONOSCOPY WITH PROPOFOL N/A 04/19/2016    Procedure: COLONOSCOPY WITH PROPOFOL;  Surgeon: Scot Jun, MD;  [facility] ENDOSCOPY;  Service: Endoscopy;  Laterality: N/A;   ESOPHAGOGASTRODUODENOSCOPY (EGD) WITH PROPOFOL N/A 10/24/2015   Procedure: ESOPHAGOGASTRODUODENOSCOPY (EGD) WITH PROPOFOL;  Surgeon: Scot Jun, MD;  Location: [facility] ENDOSCOPY;  Service: Endoscopy;  Laterality: N/A;   EYE SURGERY     HERNIA REPAIR     ERRONEOUS UMBILICAL HERNIA   2011   MYRINGOTOMY WITH TUBE PLACEMENT Bilateral 03/28/2015   Procedure: MYRINGOTOMY WITH TUBE PLACEMENT;  Surgeon: Linus Salmons, MD;  Location: [facility];  Service: ENT;  Laterality: Bilateral;  BUTTERFLY TUBE   SINUSOTOMY  02/28/2019   TONSILLECTOMY     TRANSFORAMINAL LUMBAR INTERBODY FUSION (TLIF) WITH PEDICLE SCREW FIXATION 1 LEVEL Right 06/18/2014   Procedure: TRANSFORAMINAL LUMBAR INTERBODY FUSION (TLIF) WITH PEDICLE SCREW FIXATION 1 LEVEL LUMBAR 5 -SACRAL 1;  Surgeon: Aliene Beams, MD;  Location: [facility]UR[facility];  Service: Neurosurgery;  Laterality: Right;  Right transforaminal lumbar interbody fusion with interbody prosthesis and percutaneous pedicle screws Lumbar 5 to Sacral 1   Family History  Problem Relation Age of Onset   Arthritis Mother    Hypertension Mother    Diabetes Father    Hypertension Father    Social History   Socioeconomic History   Marital status: Married    Spouse name: Not on file   Number of children: Not on file   Years of education: Not on file   Highest education level: Not on file  Occupational History   Not on file  Tobacco Use   Smoking status: Every Day    Packs/day: 1.50    Years: 40.00    Additional pack years: 0.00    Total pack years: 60.00    Types: Cigarettes   Smokeless tobacco: Current   Tobacco comments:    has cut back to 1PPD  Vaping Use   Vaping Use: Never used  Substance and Sexual Activity   Alcohol use: Yes    Alcohol/week: 0.0 standard drinks of alcohol    Comment: ONCE OR TWICE A YR   Drug  use: Yes    Types: Marijuana, "Crack" cocaine   Sexual activity: Not on file  Other Topics Concern   Not on file  Social History Narrative   Not on file   Social Determinants of Health   Financial Resource Strain: Low Risk  (06/25/2022)   Overall Financial Resource Strain (CARDIA)    Difficulty of Paying Living Expenses: Not hard at all  Food Insecurity: No Food Insecurity (06/25/2022)   Hunger Vital Sign    Worried About Running Out of Food in the Last Year: Never true    Ran Out of Food in the Last Year: Never true  Transportation Needs: No Transportation Needs (06/25/2022)  PRAPARE - Administrator, Civil Service (Medical): No    Lack of Transportation (Non-Medical): No  Physical Activity: Sufficiently Active (06/25/2022)   Exercise Vital Sign    Days of Exercise per Week: 5 days    Minutes of Exercise per Session: 30 min  Stress: No Stress Concern Present (06/25/2022)   [employer] of Occupational Health - Occupational Stress Questionnaire    Feeling of Stress : Only a little  Social Connections: Socially Isolated (06/25/2022)   Social Connection and Isolation Panel [NHANES]    Frequency of Communication with Friends and Family: More than three times a week    Frequency of Social Gatherings with Friends and Family: More than three times a week    Attends Religious Services: Never    Database administrator or Organizations: No    Attends Engineer, structural: Never    Marital Status: Divorced    Tobacco Counseling Ready to quit: Not Answered Counseling given: Not Answered Tobacco comments: has cut back to 1PPD   Clinical Intake:  Pre-visit preparation completed: Yes  Pain : 0-10 Pain Score: 5  Pain Type: Chronic pain Pain Location: Back     Nutritional Risks: None Diabetes: No  How often do you need to have someone help you when you read instructions, pamphlets, or other written materials from your doctor or pharmacy?: 1 -  Never  Diabetic?no  Interpreter Needed?: No  Information entered by :: Kennedy Bucker LPN   Activities of Daily Living    06/25/2022    1:11 PM 11/29/2021    9:47 AM  In your present state of health, do you have any difficulty performing the following activities:  Hearing? 0   Vision? 0   Difficulty concentrating or making decisions? 0   Walking or climbing stairs? 0   Dressing or bathing? 0   Doing errands, shopping? 0 0  Preparing Food and eating ? N   Using the Toilet? N   In the past six months, have you accidently leaked urine? N   Do you have problems with loss of bowel control? N   Managing your Medications? N   Managing your Finances? N   Housekeeping or managing your Housekeeping? N     Patient Care Team: Smitty Cords, DO as PCP - General (Family Medicine)  Indicate any recent Medical Services you may have received from other than [facility] providers in the past year (date may be approximate).     Assessment:   This is a routine wellness examination for Umut.  Hearing/Vision screen Hearing Screening - Comments:: No aids Vision Screening - Comments:: Wears glasses- [facility]  Dietary issues and exercise activities discussed: Current Exercise Habits: The patient has a physically strenuous job, but has no regular exercise apart from work., Type of exercise: walking, Time (Minutes): 30, Frequency (Times/Week): 5, Weekly Exercise (Minutes/Week): 150, Intensity: Mild   Goals Addressed             This Visit's Progress    DIET - EAT MORE FRUITS AND VEGETABLES         Depression Screen    06/25/2022    1:08 PM 04/21/2022    1:46 PM 12/09/2021    8:39 AM 11/18/2021    2:18 PM 06/08/2021    8:59 AM 01/28/2021    1:48 PM 01/24/2020   11:29 AM  PHQ 2/9 Scores  PHQ - 2 Score 2 0 0 1 1 0 0  PHQ- 9 Score  4  0 4       Fall Risk    06/25/2022    1:10 PM 04/21/2022    1:46 PM 02/17/2022    8:04 AM 12/09/2021    8:39 AM 09/16/2021    9:05 AM  Fall  Risk   Falls in the past year? 1 0 0 0 1  Number falls in past yr: 1    0  Injury with Fall? 0    0  Comment     fell backwards when going up steps carrying bags.  no evidence of fx when assessed at ED.  Risk for fall due to : History of fall(s)    History of fall(s)  Follow up Falls prevention discussed;Falls evaluation completed    Falls evaluation completed;Education provided    FALL RISK PREVENTION PERTAINING TO THE HOME:  Any stairs in or around the home? Yes  If so, are there any without handrails? No  Home free of loose throw rugs in walkways, pet beds, electrical cords, etc? Yes  Adequate lighting in your home to reduce risk of falls? Yes   ASSISTIVE DEVICES UTILIZED TO PREVENT FALLS:  Life alert? No  Use of a cane, walker or w/c? No  Grab bars in the bathroom? Yes  Shower chair or bench in shower? Yes  Elevated toilet seat or a handicapped toilet? Yes    Cognitive Function:        06/25/2022    1:15 PM  6CIT Screen  What Year? 0 points  What month? 0 points  What time? 0 points  Count back from 20 0 points  Months in reverse 4 points  Repeat phrase 0 points  Total Score 4 points    Immunizations Immunization History  Administered Date(s) Administered   Fluad Quad(high Dose 65+) 11/18/2021   Influenza,inj,Quad PF,6+ Mos 10/23/2019   Influenza-Unspecified 11/08/2020   Moderna Sars-Covid-2 Vaccination 02/28/2019, 03/28/2019   PNEUMOCOCCAL CONJUGATE-20 01/28/2021   Tdap 04/25/2017    TDAP status: Up to date  Flu Vaccine status: Up to date  Pneumococcal vaccine status: Up to date  Covid-19 vaccine status: Completed vaccines  Qualifies for Shingles Vaccine? Yes   Zostavax completed No   Shingrix Completed?: No.    Education has been provided regarding the importance of this vaccine. Patient has been advised to call insurance company to determine out of pocket expense if they have not yet received this vaccine. Advised may also receive vaccine at local  pharmacy or Health Dept. Verbalized acceptance and understanding.  Screening Tests Health Maintenance  Topic Date Due   Zoster Vaccines- Shingrix (1 of 2) Never done   COVID-19 Vaccine (3 - Moderna risk series) 04/25/2019   INFLUENZA VACCINE  08/26/2022   Lung Cancer Screening  11/30/2022   Medicare Annual Wellness (AWV)  06/25/2023   Colonoscopy  04/20/2026   DTaP/Tdap/Td (2 - Td or Tdap) 04/26/2027   Pneumonia Vaccine 38+ Years old  Completed   Hepatitis C Screening  Completed   HPV VACCINES  Aged Out    Health Maintenance  Health Maintenance Due  Topic Date Due   Zoster Vaccines- Shingrix (1 of 2) Never done   COVID-19 Vaccine (3 - Moderna risk series) 04/25/2019    Colorectal cancer screening: Type of screening: Colonoscopy. Completed 04/19/16. Repeat every 10 years  Lung Cancer Screening: (Low Dose CT Chest recommended if Age 53-80 years, 30 pack-year currently smoking OR have quit w/in 15years.) does qualify.   Lung Cancer Screening Referral: done 11/29/21  Additional Screening:  Hepatitis C Screening: does qualify; Completed 02/02/21  Vision Screening: Recommended annual ophthalmology exams for early detection of glaucoma and other disorders of the eye. Is the patient up to date with their annual eye exam?  Yes  Who is the provider or what is the name of the office in which the patient attends annual eye exams? [facility] If pt is not established with a provider, would they like to be referred to a provider to establish care? No .   Dental Screening: Recommended annual dental exams for proper oral hygiene  Community Resource Referral / Chronic Care Management: CRR required this visit?  No   CCM required this visit?  No      Plan:     I have personally reviewed and noted the following in the patient's chart:   Medical and social history Use of alcohol, tobacco or illicit drugs  Current medications and supplements including opioid prescriptions. Patient is  not currently taking opioid prescriptions. Functional ability and status Nutritional status Physical activity Advanced directives List of other physicians Hospitalizations, surgeries, and ER visits in previous 12 months Vitals Screenings to include cognitive, depression, and falls Referrals and appointments  In addition, I have reviewed and discussed with patient certain preventive protocols, quality metrics, and best practice recommendations. A written personalized care plan for preventive services as well as general preventive health recommendations were provided to patient.     Hal Hope, LPN   [id number]/95/[id number]   Nurse Notes: none

## 2022-06-25 NOTE — Patient Instructions (Signed)
Ryan [name] , Thank you for taking time to come for your Medicare Wellness Visit. I appreciate your ongoing commitment to your health goals. Please review the following plan we discussed and let me know if I can assist you in the future.   These are the goals we discussed:  Goals      DIET - EAT MORE FRUITS AND VEGETABLES        This is a list of the screening recommended for you and due dates:  Health Maintenance  Topic Date Due   Zoster (Shingles) Vaccine (1 of 2) Never done   COVID-19 Vaccine (3 - Moderna risk series) 04/25/2019   Flu Shot  08/26/2022   Screening for Lung Cancer  11/30/2022   Medicare Annual Wellness Visit  06/25/2023   Colon Cancer Screening  04/20/2026   DTaP/Tdap/Td vaccine (2 - Td or Tdap) 04/26/2027   Pneumonia Vaccine  Completed   Hepatitis C Screening  Completed   HPV Vaccine  Aged Out    Advanced directives: no  Conditions/risks identified: none  Next appointment: Follow up in one year for your annual wellness visit. 07/01/23 @ 1:00 pm by phone  Preventive Care 65 Years and Older, Male  Preventive care refers to lifestyle choices and visits with your health care provider that can promote health and wellness. What does preventive care include? A yearly physical exam. This is also called an annual well check. Dental exams once or twice a year. Routine eye exams. Ask your health care provider how often you should have your eyes checked. Personal lifestyle choices, including: Daily care of your teeth and gums. Regular physical activity. Eating a healthy diet. Avoiding tobacco and drug use. Limiting alcohol use. Practicing safe sex. Taking low doses of aspirin every day. Taking vitamin and mineral supplements as recommended by your health care provider. What happens during an annual well check? The services and screenings done by your health care provider during your annual well check will depend on your age, overall health, lifestyle risk factors,  and family history of disease. Counseling  Your health care provider may ask you questions about your: Alcohol use. Tobacco use. Drug use. Emotional well-being. Home and relationship well-being. Sexual activity. Eating habits. History of falls. Memory and ability to understand (cognition). Work and work Astronomer. Screening  You may have the following tests or measurements: Height, weight, and BMI. Blood pressure. Lipid and cholesterol levels. These may be checked every 5 years, or more frequently if you are over 64 years old. Skin check. Lung cancer screening. You may have this screening every year starting at age 23 if you have a 30-pack-year history of smoking and currently smoke or have quit within the past 15 years. Fecal occult blood test (FOBT) of the stool. You may have this test every year starting at age 29. Flexible sigmoidoscopy or colonoscopy. You may have a sigmoidoscopy every 5 years or a colonoscopy every 10 years starting at age 45. Prostate cancer screening. Recommendations will vary depending on your family history and other risks. Hepatitis C blood test. Hepatitis B blood test. Sexually transmitted disease (STD) testing. Diabetes screening. This is done by checking your blood sugar (glucose) after you have not eaten for a while (fasting). You may have this done every 1-3 years. Abdominal aortic aneurysm (AAA) screening. You may need this if you are a current or former smoker. Osteoporosis. You may be screened starting at age 97 if you are at high risk. Talk with your health care  provider about your test results, treatment options, and if necessary, the need for more tests. Vaccines  Your health care provider may recommend certain vaccines, such as: Influenza vaccine. This is recommended every year. Tetanus, diphtheria, and acellular pertussis (Tdap, Td) vaccine. You may need a Td booster every 10 years. Zoster vaccine. You may need this after age  43. Pneumococcal 13-valent conjugate (PCV13) vaccine. One dose is recommended after age 97. Pneumococcal polysaccharide (PPSV23) vaccine. One dose is recommended after age 104. Talk to your health care provider about which screenings and vaccines you need and how often you need them. This information is not intended to replace advice given to you by your health care provider. Make sure you discuss any questions you have with your health care provider. Document Released: 02/07/2015 Document Revised: 10/01/2015 Document Reviewed: 11/12/2014 Elsevier Interactive Patient Education  2017 [employer].  Fall Prevention in the Home Falls can cause injuries. They can happen to people of all ages. There are many things you can do to make your home safe and to help prevent falls. What can I do on the outside of my home? Regularly fix the edges of walkways and driveways and fix any cracks. Remove anything that might make you trip as you walk through a door, such as a raised step or threshold. Trim any bushes or trees on the path to your home. Use bright outdoor lighting. Clear any walking paths of anything that might make someone trip, such as rocks or tools. Regularly check to see if handrails are loose or broken. Make sure that both sides of any steps have handrails. Any raised decks and porches should have guardrails on the edges. Have any leaves, snow, or ice cleared regularly. Use sand or salt on walking paths during winter. Clean up any spills in your garage right away. This includes oil or grease spills. What can I do in the bathroom? Use night lights. Install grab bars by the toilet and in the tub and shower. Do not use towel bars as grab bars. Use non-skid mats or decals in the tub or shower. If you need to sit down in the shower, use a plastic, non-slip stool. Keep the floor dry. Clean up any water that spills on the floor as soon as it happens. Remove soap buildup in the tub or shower  regularly. Attach bath mats securely with double-sided non-slip rug tape. Do not have throw rugs and other things on the floor that can make you trip. What can I do in the bedroom? Use night lights. Make sure that you have a light by your bed that is easy to reach. Do not use any sheets or blankets that are too big for your bed. They should not hang down onto the floor. Have a firm chair that has side arms. You can use this for support while you get dressed. Do not have throw rugs and other things on the floor that can make you trip. What can I do in the kitchen? Clean up any spills right away. Avoid walking on wet floors. Keep items that you use a lot in easy-to-reach places. If you need to reach something above you, use a strong step stool that has a grab bar. Keep electrical cords out of the way. Do not use floor polish or wax that makes floors slippery. If you must use wax, use non-skid floor wax. Do not have throw rugs and other things on the floor that can make you trip. What can I  do with my stairs? Do not leave any items on the stairs. Make sure that there are handrails on both sides of the stairs and use them. Fix handrails that are broken or loose. Make sure that handrails are as long as the stairways. Check any carpeting to make sure that it is firmly attached to the stairs. Fix any carpet that is loose or worn. Avoid having throw rugs at the top or bottom of the stairs. If you do have throw rugs, attach them to the floor with carpet tape. Make sure that you have a light switch at the top of the stairs and the bottom of the stairs. If you do not have them, ask someone to add them for you. What else can I do to help prevent falls? Wear shoes that: Do not have high heels. Have rubber bottoms. Are comfortable and fit you well. Are closed at the toe. Do not wear sandals. If you use a stepladder: Make sure that it is fully opened. Do not climb a closed stepladder. Make sure that  both sides of the stepladder are locked into place. Ask someone to hold it for you, if possible. Clearly mark and make sure that you can see: Any grab bars or handrails. First and last steps. Where the edge of each step is. Use tools that help you move around (mobility aids) if they are needed. These include: Canes. Walkers. Scooters. Crutches. Turn on the lights when you go into a dark area. Replace any light bulbs as soon as they burn out. Set up your furniture so you have a clear path. Avoid moving your furniture around. If any of your floors are uneven, fix them. If there are any pets around you, be aware of where they are. Review your medicines with your doctor. Some medicines can make you feel dizzy. This can increase your chance of falling. Ask your doctor what other things that you can do to help prevent falls. This information is not intended to replace advice given to you by your health care provider. Make sure you discuss any questions you have with your health care provider. Document Released: 11/07/2008 Document Revised: 06/19/2015 Document Reviewed: 02/15/2014 Elsevier Interactive Patient Education  2017 [employer].

## 2022-06-28 ENCOUNTER — Ambulatory Visit: Payer: 59 | Admitting: Internal Medicine

## 2022-06-28 NOTE — Progress Notes (Deleted)
Subjective:    Patient ID: Marlane [name], male    DOB: [birth year], 67 y.o.   MRN: [id number]  HPI  Patient presents to clinic today with complaint of a tick bite.  This occurred.  Review of Systems     Past Medical History:  Diagnosis Date   Anxiety    Arthritis    left hand   Asthma    Chronic kidney disease    HAS HAD KIDNEY STONE 2015-- JUST PASSED   COPD (chronic obstructive pulmonary disease) (HCC)    Dyspnea    GERD (gastroesophageal reflux disease)    TAKES TUMS FOR RELIEF   Hyperlipidemia    Hypertension    Improved, No longer on meds   Lower urinary tract symptoms (LUTS)    Prostate disease    Stroke [facility])    Thyroid disease    Wears dentures    hass full upper plate, not wearing, broken   Wears hearing aid     Current Outpatient Medications  Medication Sig Dispense Refill   albuterol (PROVENTIL) (2.5 MG/3ML) 0.083% nebulizer solution Take 3 mLs (2.5 mg total) by nebulization every 6 (six) hours as needed for wheezing or shortness of breath. 150 mL 5   albuterol (VENTOLIN HFA) 108 (90 Base) MCG/ACT inhaler Inhale 2 puffs into the lungs every 6 (six) hours as needed for wheezing or shortness of breath. 8 g 3   ALPRAZolam (XANAX) 1 MG tablet Take 1 tablet (1 mg total) by mouth 2 (two) times daily as needed. 20 tablet 0   amoxicillin-clavulanate (AUGMENTIN) 875-125 MG tablet Take 1 tablet by mouth 2 (two) times daily. (Patient not taking: Reported on 02/17/2022) 20 tablet 0   aspirin EC 81 MG EC tablet Take 1 tablet (81 mg total) by mouth daily. Swallow whole. (Patient not taking: Reported on 06/25/2022) 30 tablet 11   atorvastatin (LIPITOR) 80 MG tablet Take 1 tablet (80 mg total) by mouth at bedtime. 90 tablet 3   fluticasone furoate-vilanterol (BREO ELLIPTA) 200-25 MCG/ACT AEPB Inhale 1 puff into the lungs daily. (Patient not taking: Reported on 06/25/2022) 28 each EACH   gabapentin (NEURONTIN) 400 MG capsule Take 400 mg by mouth in the morning and at bedtime. 400  mg twice a day as per patient --psychiatrist changed that Normally takes 400 mg daily     lisinopril (ZESTRIL) 20 MG tablet Take 1 tablet (20 mg total) by mouth daily. 90 tablet 3   nicotine (NICODERM CQ - DOSED IN MG/24 HOURS) 21 mg/24hr patch Place 1 patch (21 mg total) onto the skin daily. (Patient not taking: Reported on 06/25/2022) 28 patch 0   sildenafil (REVATIO) 20 MG tablet Take 4-5 tablets (80-100 mg total) by mouth as needed (ED). 60 tablet 5   tamsulosin (FLOMAX) 0.4 MG CAPS capsule Take 1 capsule (0.4 mg total) by mouth in the morning and at bedtime. 180 capsule 3   No current facility-administered medications for this visit.    No Known Allergies  Family History  Problem Relation Age of Onset   Arthritis Mother    Hypertension Mother    Diabetes Father    Hypertension Father     Social History   Socioeconomic History   Marital status: Married    Spouse name: Not on file   Number of children: Not on file   Years of education: Not on file   Highest education level: Not on file  Occupational History   Not on file  Tobacco Use  Smoking status: Every Day    Packs/day: 1.50    Years: 40.00    Additional pack years: 0.00    Total pack years: 60.00    Types: Cigarettes   Smokeless tobacco: Current   Tobacco comments:    has cut back to 1PPD  Vaping Use   Vaping Use: Never used  Substance and Sexual Activity   Alcohol use: Yes    Alcohol/week: 0.0 standard drinks of alcohol    Comment: ONCE OR TWICE A YR   Drug use: Yes    Types: Marijuana, "Crack" cocaine   Sexual activity: Not on file  Other Topics Concern   Not on file  Social History Narrative   Not on file   Social Determinants of Health   Financial Resource Strain: Low Risk  (06/25/2022)   Overall Financial Resource Strain (CARDIA)    Difficulty of Paying Living Expenses: Not hard at all  Food Insecurity: No Food Insecurity (06/25/2022)   Hunger Vital Sign    Worried About Running Out of Food in  the Last Year: Never true    Ran Out of Food in the Last Year: Never true  Transportation Needs: No Transportation Needs (06/25/2022)   PRAPARE - Administrator, Civil Service (Medical): No    Lack of Transportation (Non-Medical): No  Physical Activity: Sufficiently Active (06/25/2022)   Exercise Vital Sign    Days of Exercise per Week: 5 days    Minutes of Exercise per Session: 30 min  Stress: No Stress Concern Present (06/25/2022)   [employer] of Occupational Health - Occupational Stress Questionnaire    Feeling of Stress : Only a little  Social Connections: Socially Isolated (06/25/2022)   Social Connection and Isolation Panel [NHANES]    Frequency of Communication with Friends and Family: More than three times a week    Frequency of Social Gatherings with Friends and Family: More than three times a week    Attends Religious Services: Never    Database administrator or Organizations: No    Attends Banker Meetings: Never    Marital Status: Divorced  Catering manager Violence: Not At Risk (06/25/2022)   Humiliation, Afraid, Rape, and Kick questionnaire    Fear of Current or Ex-Partner: No    Emotionally Abused: No    Physically Abused: No    Sexually Abused: No     Constitutional: Denies fever, malaise, fatigue, headache or abrupt weight changes.  HEENT: Denies eye pain, eye redness, ear pain, ringing in the ears, wax buildup, runny nose, nasal congestion, bloody nose, or sore throat. Respiratory: Denies difficulty breathing, shortness of breath, cough or sputum production.   Cardiovascular: Denies chest pain, chest tightness, palpitations or swelling in the hands or feet.  Gastrointestinal: Denies abdominal pain, bloating, constipation, diarrhea or blood in the stool.  GU: Denies urgency, frequency, pain with urination, burning sensation, blood in urine, odor or discharge. Musculoskeletal: Patient reports chronic back pain.  Denies decrease in range of  motion, difficulty with gait, muscle pain or joint swelling.  Skin: Denies redness, rashes, lesions or ulcercations.  Neurological: Denies dizziness, difficulty with memory, difficulty with speech or problems with balance and coordination.  Psych: Denies anxiety, depression, SI/HI.  No other specific complaints in a complete review of systems (except as listed in HPI above).  Objective:   Physical Exam   There were no vitals taken for this visit. Wt Readings from Last 3 Encounters:  06/25/22 167 lb (75.8 kg)  04/21/22 158 lb (71.7 kg)  02/17/22 175 lb (79.4 kg)    General: Appears their stated age, well developed, well nourished in NAD. Skin: Warm, dry and intact. No rashes, lesions or ulcerations noted. HEENT: Head: normal shape and size; Eyes: sclera white, no icterus, conjunctiva pink, PERRLA and EOMs intact; Ears: Tm's gray and intact, normal light reflex; Nose: mucosa pink and moist, septum midline; Throat/Mouth: Teeth present, mucosa pink and moist, no exudate, lesions or ulcerations noted.  Neck:  Neck supple, trachea midline. No masses, lumps or thyromegaly present.  Cardiovascular: Normal rate and rhythm. S1,S2 noted.  No murmur, rubs or gallops noted. No JVD or BLE edema. No carotid bruits noted. Pulmonary/Chest: Normal effort and positive vesicular breath sounds. No respiratory distress. No wheezes, rales or ronchi noted.  Abdomen: Soft and nontender. Normal bowel sounds. No distention or masses noted. Liver, spleen and kidneys non palpable. Musculoskeletal: Normal range of motion. No signs of joint swelling. No difficulty with gait.  Neurological: Alert and oriented. Cranial nerves II-XII grossly intact. Coordination normal.  Psychiatric: Mood and affect normal. Behavior is normal. Judgment and thought content normal.     BMET    Component Value Date/Time   NA 137 11/30/2021 0416   NA 137 07/15/2011 1713   K 3.5 11/30/2021 0416   K 4.3 07/15/2011 1713   CL 104  11/30/2021 0416   CL 103 07/15/2011 1713   CO2 24 11/30/2021 0416   CO2 27 07/15/2011 1713   GLUCOSE 191 (H) 11/30/2021 0416   GLUCOSE 106 (H) 07/15/2011 1713   BUN 12 11/30/2021 0416   BUN 13 07/15/2011 1713   CREATININE 0.75 11/30/2021 0416   CREATININE 0.98 11/18/2021 1352   CALCIUM 8.9 11/30/2021 0416   CALCIUM 9.6 07/15/2011 1713   GFRNONAA >60 11/30/2021 0416   GFRNONAA >60 07/15/2011 1713   GFRAA >60 09/11/2019 0510   GFRAA >60 07/15/2011 1713    Lipid Panel     Component Value Date/Time   CHOL 84 11/30/2021 0416   TRIG 26 11/30/2021 0416   HDL 40 (L) 11/30/2021 0416   CHOLHDL 2.1 11/30/2021 0416   VLDL 5 11/30/2021 0416   LDLCALC 39 11/30/2021 0416   LDLCALC 36 11/18/2021 1352    CBC    Component Value Date/Time   WBC 19.5 (H) 11/30/2021 0416   RBC 4.16 (L) 11/30/2021 0416   HGB 13.2 11/30/2021 0416   HGB 17.5 07/15/2011 1713   HCT 39.0 11/30/2021 0416   HCT 52.8 (H) 07/15/2011 1713   PLT 237 11/30/2021 0416   PLT 265 07/15/2011 1713   MCV 93.8 11/30/2021 0416   MCV 93 07/15/2011 1713   MCH 31.7 11/30/2021 0416   MCHC 33.8 11/30/2021 0416   RDW 12.1 11/30/2021 0416   RDW 14.1 07/15/2011 1713   LYMPHSABS 2.2 11/29/2021 0254   MONOABS 1.0 11/29/2021 0254   EOSABS 0.2 11/29/2021 0254   BASOSABS 0.1 11/29/2021 0254    Hgb A1C Lab Results  Component Value Date   HGBA1C 5.8 (H) 11/18/2021           Assessment & Plan:      Follow-up with your PCP as previously scheduled Nicki Reaper, NP

## 2022-08-26 ENCOUNTER — Encounter: Payer: Self-pay | Admitting: Student in an Organized Health Care Education/Training Program

## 2022-08-26 ENCOUNTER — Ambulatory Visit
Payer: 59 | Attending: Student in an Organized Health Care Education/Training Program | Admitting: Student in an Organized Health Care Education/Training Program

## 2022-08-26 VITALS — BP 124/69 | HR 85 | Temp 97.3°F | Resp 16 | Ht 67.0 in | Wt 167.0 lb

## 2022-08-26 DIAGNOSIS — G894 Chronic pain syndrome: Secondary | ICD-10-CM | POA: Insufficient documentation

## 2022-08-26 DIAGNOSIS — M961 Postlaminectomy syndrome, not elsewhere classified: Secondary | ICD-10-CM | POA: Insufficient documentation

## 2022-08-26 DIAGNOSIS — M47816 Spondylosis without myelopathy or radiculopathy, lumbar region: Secondary | ICD-10-CM | POA: Diagnosis not present

## 2022-08-26 NOTE — Progress Notes (Signed)
PROVIDER NOTE: Information contained herein reflects review and annotations entered in association with encounter. Interpretation of such information and data should be left to medically-trained personnel. Information provided to patient can be located elsewhere in the medical record under "Patient Instructions". Document created using STT-dictation technology, any transcriptional errors that may result from process are unintentional.    Patient: Ryan [name]  Service Category: E/M  Provider: Edward Jolly, MD  DOB: [birth year]  DOS: 08/26/2022  Referring Provider: Saralyn Pilar *  MRN: [id number]  Specialty: Interventional Pain Management  PCP: Smitty Cords, DO  Type: Established Patient  Setting: Ambulatory outpatient    Location: Office  Delivery: Face-to-face     HPI  Mr. Ryan [name], a 67 y.o. year old male, is here today because of his Lumbar spondylosis [M47.816]. Mr. Ryan [name] primary complain today is Back Pain (Lumbar bilateral )  Pertinent problems: Mr. Ryan [name] has DDD (degenerative disc disease), lumbosacral; Spinal stenosis, lumbar region, with neurogenic claudication; Lumbar radiculopathy; Lumbar spondylosis; Failed back surgical syndrome; and History of lumbar fusion on their pertinent problem list. Pain Assessment: Severity of Chronic pain is reported as a 6 /10. Location: Back Lower, Left, Right/down both legs. Onset: More than a month ago. Quality: Discomfort, Constant, Throbbing, Tingling, Nagging, Aching. Timing: Constant. Modifying factor(s): nothing currently. Vitals:  height is 5\' 7"  (1.702 m) and weight is 167 lb (75.8 kg). His temporal temperature is 97.3 F (36.3 C) (abnormal). His blood pressure is 124/69 and his pulse is 85. His respiration is 16 and oxygen saturation is 97%.  BMI: Estimated body mass index is 26.16 kg/m as calculated from the following:   Height as of this encounter: 5\' 7"  (1.702 m).   Weight as of this encounter: 167 lb (75.8 kg). Last  encounter: 03/31/2022. Last procedure: 04/21/2022.  Reason for encounter: evaluation of worsening, or previously known (established) problem.   Patient presents today with worsening axial low back pain related to lumbar facet arthropathy and lumbar spondylosis.  His previous therapeutic lumbar facet medial branch nerve block was 02/17/2022. This provided him with 75% pain relief for approximately 4 months.  Given return of pain and difficulty bending and lifting, we discussed repeating therapeutic lumbar facet medial branch nerve block.  Risk and benefits reviewed and patient would like to proceed.    ROS  Constitutional: Denies any fever or chills Gastrointestinal: No reported hemesis, hematochezia, vomiting, or acute GI distress Musculoskeletal:  Low back pain Neurological: No reported episodes of acute onset apraxia, aphasia, dysarthria, agnosia, amnesia, paralysis, loss of coordination, or loss of consciousness  Medication Review  ALPRAZolam, albuterol, atorvastatin, gabapentin, lisinopril, sildenafil, and tamsulosin  History Review  Allergy: Ryan [name] has No Known Allergies. Drug: Ryan [name]  reports current drug use. Drugs: Marijuana and "Crack" cocaine. Alcohol:  reports current alcohol use. Tobacco:  reports that he has been smoking cigarettes. He has a 60 pack-year smoking history. He uses smokeless tobacco. Social: Ryan [name]  reports that he has been smoking cigarettes. He has a 60 pack-year smoking history. He uses smokeless tobacco. He reports current alcohol use. He reports current drug use. Drugs: Marijuana and "Crack" cocaine. Medical:  has a past medical history of Anxiety, Arthritis, Asthma, Chronic kidney disease, COPD (chronic obstructive pulmonary disease) (HCC), Dyspnea, GERD (gastroesophageal reflux disease), Hyperlipidemia, Hypertension, Lower urinary tract symptoms (LUTS), Prostate disease, Stroke [facility]), Thyroid disease, Wears dentures, and Wears hearing aid. Surgical:  Ryan [name]  has a past surgical history that includes Eye surgery; Hernia repair;  Transforaminal lumbar interbody fusion (tlif) with pedicle screw fixation 1 level (Right, 06/18/2014); Myringotomy with tube placement (Bilateral, 03/28/2015); Back surgery; Esophagogastroduodenoscopy (egd) with propofol (N/A, 10/24/2015); Cataract extraction w/PHACO (Left, 01/06/2016); Tonsillectomy; Colonoscopy with propofol (N/A, 04/19/2016); and Sinusotomy (02/28/2019). Family: family history includes Arthritis in his mother; Diabetes in his father; Hypertension in his father and mother.  Laboratory Chemistry Profile   Renal Lab Results  Component Value Date   BUN 12 11/30/2021   CREATININE 0.75 11/30/2021   BCR SEE NOTE: 11/18/2021   GFRAA >60 09/11/2019   GFRNONAA >60 11/30/2021    Hepatic Lab Results  Component Value Date   AST 20 11/29/2021   ALT 14 11/29/2021   ALBUMIN 3.7 11/29/2021   ALKPHOS 119 11/29/2021   LIPASE 36 11/29/2021    Electrolytes Lab Results  Component Value Date   NA 137 11/30/2021   K 3.5 11/30/2021   CL 104 11/30/2021   CALCIUM 8.9 11/30/2021   MG 1.9 11/27/2021   PHOS 4.3 11/27/2021    Bone Lab Results  Component Value Date   TESTOSTERONE 473 02/02/2021    Inflammation (CRP: Acute Phase) (ESR: Chronic Phase) Lab Results  Component Value Date   LATICACIDVEN 2.7 (HH) 11/29/2021         Note: Above Lab results reviewed.    Physical Exam  General appearance: Well nourished, well developed, and well hydrated. In no apparent acute distress Mental status: Alert, oriented x 3 (person, place, & time)       Respiratory: No evidence of acute respiratory distress Eyes: PERLA Vitals: BP 124/69 (BP Location: Left Arm, Patient Position: Sitting, Cuff Size: Normal)   Pulse 85   Temp (!) 97.3 F (36.3 C) (Temporal)   Resp 16   Ht 5\' 7"  (1.702 m)   Wt 167 lb (75.8 kg)   SpO2 97%   BMI 26.16 kg/m  BMI: Estimated body mass index is 26.16 kg/m as calculated from the  following:   Height as of this encounter: 5\' 7"  (1.702 m).   Weight as of this encounter: 167 lb (75.8 kg). Ideal: Ideal body weight: 66.1 kg (145 lb 11.6 oz) Adjusted ideal body weight: 70 kg (154 lb 3.8 oz)  Lumbar Spine Area Exam  Skin & Axial Inspection: Well healed scar from previous spine surgery detected Alignment: Symmetrical Functional ROM: Pain restricted ROM affecting both sides Stability: No instability detected Muscle Tone/Strength: Functionally intact. No obvious neuro-muscular anomalies detected. Sensory (Neurological): Musculoskeletal pain pattern Palpation: Complains of area being tender to palpation       Provocative Tests: Hyperextension/rotation test: (+) bilaterally for facet joint pain. Lumbar quadrant test (Kemp's test): (+) bilaterally for facet joint pain.  Gait & Posture Assessment  Ambulation: Unassisted Gait: Relatively normal for age and body habitus Posture: WNL  Lower Extremity Exam    Side: Right lower extremity  Side: Left lower extremity  Stability: No instability observed          Stability: No instability observed          Skin & Extremity Inspection: Skin color, temperature, and hair growth are WNL. No peripheral edema or cyanosis. No masses, redness, swelling, asymmetry, or associated skin lesions. No contractures.  Skin & Extremity Inspection: Skin color, temperature, and hair growth are WNL. No peripheral edema or cyanosis. No masses, redness, swelling, asymmetry, or associated skin lesions. No contractures.  Functional ROM: Unrestricted ROM                  Functional  ROM: Unrestricted ROM                  Muscle Tone/Strength: Functionally intact. No obvious neuro-muscular anomalies detected.  Muscle Tone/Strength: Functionally intact. No obvious neuro-muscular anomalies detected.  Sensory (Neurological): Unimpaired        Sensory (Neurological): Unimpaired        DTR: Patellar: deferred today Achilles: deferred today Plantar: deferred today   DTR: Patellar: deferred today Achilles: deferred today Plantar: deferred today  Palpation: No palpable anomalies  Palpation: No palpable anomalies    Assessment   Diagnosis Status  1. Lumbar spondylosis   2. Lumbar facet arthropathy   3. Failed back surgical syndrome   4. Chronic pain syndrome    Having a Flare-up Having a Flare-up Controlled     Plan of Care   Orders:  Orders Placed This Encounter  Procedures   LUMBAR FACET(MEDIAL BRANCH NERVE BLOCK) MBNB    Standing Status:   Future    Standing Expiration Date:   11/26/2022    Scheduling Instructions:     Procedure: Lumbar facet block (AKA.: Lumbosacral medial branch nerve block)     Side: Bilateral     Level: L1-2, L2-3, L3-4, L4-5, and L5-S1 Facets (L2, L3, L4, L5, and S1 Medial Branch)     Sedation: ECT     Timeframe: ASAA    Order Specific Question:   Where will this procedure be performed?    Answer:   [facility]   Follow-up plan:   Return in about 11 days (around 09/06/2022) for B/L L2, 3, 4, 5 MBNB , ECT.      Status post bilateral L3, L4, L5, S1 lumbar facet medial branch nerve blocks on 03/20/2018 helped 85% pain relief for approximately 1 year. Status post left L3, L4, L5 RFA on 07/11/2019, Right L3, L4, L5 RFA 07/25/19. Not as helpful as lumbar facet MBBs- repeat PRN, consider SPRINT PNS                   Recent Visits No visits were found meeting these conditions. Showing recent visits within past 90 days and meeting all other requirements Today's Visits Date Type Provider Dept  08/26/22 Office Visit Edward Jolly, MD [facility]  Showing today's visits and meeting all other requirements Future Appointments No visits were found meeting these conditions. Showing future appointments within next 90 days and meeting all other requirements  I discussed the assessment and treatment plan with the patient. The patient was provided an opportunity to ask questions and all were answered.  The patient agreed with the plan and demonstrated an understanding of the instructions.  Patient advised to call back or seek an in-person evaluation if the symptoms or condition worsens.  Duration of encounter: .  Total time on encounter, as per AMA guidelines included both the face-to-face and non-face-to-face time personally spent by the physician and/or other qualified health care professional(s) on the day of the encounter (includes time in activities that require the physician or other qualified health care professional and does not include time in activities normally performed by clinical staff). Physician's time may include the following activities when performed: Preparing to see the patient (e.g., pre-charting review of records, searching for previously ordered imaging, lab work, and nerve conduction tests) Review of prior analgesic pharmacotherapies. Reviewing PMP Interpreting ordered tests (e.g., lab work, imaging, nerve conduction tests) Performing post-procedure evaluations, including interpretation of diagnostic procedures Obtaining and/or reviewing separately obtained history Performing a medically  appropriate examination and/or evaluation Counseling and educating the patient/family/caregiver Ordering medications, tests, or procedures Referring and communicating with other health care professionals (when not separately reported) Documenting clinical information in the electronic or other health record Independently interpreting results (not separately reported) and communicating results to the patient/ family/caregiver Care coordination (not separately reported)  Note by: Edward Jolly, MD Date: 08/26/2022; Time: 3:06 PM

## 2022-08-26 NOTE — Progress Notes (Signed)
Safety precautions to be maintained throughout the outpatient stay will include: orient to surroundings, keep bed in low position, maintain call bell within reach at all times, provide assistance with transfer out of bed and ambulation.  

## 2022-08-26 NOTE — Patient Instructions (Signed)

## 2022-09-13 ENCOUNTER — Other Ambulatory Visit: Payer: Self-pay

## 2022-09-13 ENCOUNTER — Emergency Department: Payer: 59

## 2022-09-13 ENCOUNTER — Emergency Department
Admission: EM | Admit: 2022-09-13 | Discharge: 2022-09-13 | Disposition: A | Discharge status: Home / Self Care | Payer: 59 | Attending: Emergency Medicine | Admitting: Emergency Medicine

## 2022-09-13 ENCOUNTER — Ambulatory Visit
Payer: 59 | Attending: Student in an Organized Health Care Education/Training Program | Admitting: Student in an Organized Health Care Education/Training Program

## 2022-09-13 DIAGNOSIS — M546 Pain in thoracic spine: Secondary | ICD-10-CM | POA: Insufficient documentation

## 2022-09-13 DIAGNOSIS — M545 Low back pain, unspecified: Secondary | ICD-10-CM | POA: Diagnosis not present

## 2022-09-13 DIAGNOSIS — G8929 Other chronic pain: Secondary | ICD-10-CM | POA: Diagnosis not present

## 2022-09-13 DIAGNOSIS — I129 Hypertensive chronic kidney disease with stage 1 through stage 4 chronic kidney disease, or unspecified chronic kidney disease: Secondary | ICD-10-CM | POA: Diagnosis not present

## 2022-09-13 DIAGNOSIS — N189 Chronic kidney disease, unspecified: Secondary | ICD-10-CM | POA: Diagnosis not present

## 2022-09-13 DIAGNOSIS — S199XXA Unspecified injury of neck, initial encounter: Secondary | ICD-10-CM | POA: Diagnosis not present

## 2022-09-13 DIAGNOSIS — Z8673 Personal history of transient ischemic attack (TIA), and cerebral infarction without residual deficits: Secondary | ICD-10-CM | POA: Insufficient documentation

## 2022-09-13 DIAGNOSIS — S39012A Strain of muscle, fascia and tendon of lower back, initial encounter: Secondary | ICD-10-CM

## 2022-09-13 DIAGNOSIS — Y92410 Unspecified street and highway as the place of occurrence of the external cause: Secondary | ICD-10-CM | POA: Diagnosis not present

## 2022-09-13 DIAGNOSIS — Z743 Need for continuous supervision: Secondary | ICD-10-CM | POA: Diagnosis not present

## 2022-09-13 DIAGNOSIS — J449 Chronic obstructive pulmonary disease, unspecified: Secondary | ICD-10-CM | POA: Diagnosis not present

## 2022-09-13 DIAGNOSIS — S3992XA Unspecified injury of lower back, initial encounter: Secondary | ICD-10-CM | POA: Diagnosis not present

## 2022-09-13 DIAGNOSIS — R918 Other nonspecific abnormal finding of lung field: Secondary | ICD-10-CM | POA: Diagnosis not present

## 2022-09-13 DIAGNOSIS — M542 Cervicalgia: Secondary | ICD-10-CM | POA: Diagnosis not present

## 2022-09-13 DIAGNOSIS — S299XXA Unspecified injury of thorax, initial encounter: Secondary | ICD-10-CM | POA: Diagnosis not present

## 2022-09-13 DIAGNOSIS — S161XXA Strain of muscle, fascia and tendon at neck level, initial encounter: Secondary | ICD-10-CM | POA: Diagnosis not present

## 2022-09-13 DIAGNOSIS — R6889 Other general symptoms and signs: Secondary | ICD-10-CM | POA: Diagnosis not present

## 2022-09-13 MED ORDER — HYDROMORPHONE HCL 1 MG/ML IJ SOLN
1.0000 mg | Freq: Once | INTRAMUSCULAR | Status: AC
  Administered 2022-09-13: 1 mg via INTRAMUSCULAR
  Filled 2022-09-13: qty 1

## 2022-09-13 MED ORDER — OXYCODONE-ACETAMINOPHEN 5-325 MG PO TABS
1.0000 | ORAL_TABLET | Freq: Once | ORAL | Status: AC
  Administered 2022-09-13: 1 via ORAL
  Filled 2022-09-13: qty 1

## 2022-09-13 MED ORDER — FENTANYL CITRATE PF 50 MCG/ML IJ SOSY
50.0000 ug | PREFILLED_SYRINGE | Freq: Once | INTRAMUSCULAR | Status: AC
  Administered 2022-09-13: 50 ug via INTRAMUSCULAR
  Filled 2022-09-13: qty 1
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
## 2022-09-16 ENCOUNTER — Telehealth: Payer: Self-pay | Admitting: *Deleted
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
## 2022-09-20 ENCOUNTER — Ambulatory Visit: Payer: Self-pay
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
## 2022-09-20 NOTE — Telephone Encounter (Signed)
Pt advised.  He agreed to try a muscle relaxer.  Please send to [facility] [address]    Thanks,   -Vernona Rieger

## 2022-10-01 DIAGNOSIS — M5416 Radiculopathy, lumbar region: Secondary | ICD-10-CM | POA: Diagnosis not present

## 2022-10-05 ENCOUNTER — Encounter: Payer: Self-pay | Admitting: Family Medicine

## 2022-10-05 ENCOUNTER — Ambulatory Visit (INDEPENDENT_AMBULATORY_CARE_PROVIDER_SITE_OTHER): Payer: 59 | Admitting: Family Medicine

## 2022-10-05 VITALS — BP 142/92 | HR 99 | Ht 67.0 in | Wt 167.0 lb

## 2022-10-05 DIAGNOSIS — M542 Cervicalgia: Secondary | ICD-10-CM | POA: Diagnosis not present

## 2022-10-05 DIAGNOSIS — M5416 Radiculopathy, lumbar region: Secondary | ICD-10-CM

## 2022-10-05 DIAGNOSIS — Z23 Encounter for immunization: Secondary | ICD-10-CM

## 2022-10-05 DIAGNOSIS — M62830 Muscle spasm of back: Secondary | ICD-10-CM

## 2022-10-05 MED ORDER — TIZANIDINE HCL 4 MG PO TABS
4.0000 mg | ORAL_TABLET | Freq: Three times a day (TID) | ORAL | 2 refills | Status: DC | PRN
Start: 2022-10-05 — End: 2023-08-08

## 2022-10-05 MED ORDER — OXYCODONE-ACETAMINOPHEN 5-325 MG PO TABS
1.0000 | ORAL_TABLET | ORAL | 0 refills | Status: AC | PRN
Start: 2022-10-05 — End: 2022-10-10

## 2022-10-05 NOTE — Patient Instructions (Addendum)
Thank you for coming to the office today.    Please schedule a Follow-up Appointment to: Return if symptoms worsen or fail to improve.  If you have any other questions or concerns, please feel free to call the office or send a message through [place]. You may also schedule an earlier appointment if necessary.  Additionally, you may be receiving a survey about your experience at our office within a few days to 1 week by e-mail or mail. We value your feedback.  Nobie Putnam, DO [facility]
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
## 2022-10-07 DIAGNOSIS — Z961 Presence of intraocular lens: Secondary | ICD-10-CM | POA: Diagnosis not present

## 2022-10-07 DIAGNOSIS — H2511 Age-related nuclear cataract, right eye: Secondary | ICD-10-CM | POA: Diagnosis not present

## 2022-10-07 DIAGNOSIS — H31002 Unspecified chorioretinal scars, left eye: Secondary | ICD-10-CM | POA: Diagnosis not present

## 2022-10-13 ENCOUNTER — Ambulatory Visit
Admission: RE | Admit: 2022-10-13 | Discharge: 2022-10-13 | Disposition: A | Discharge status: Home / Self Care | Payer: 59 | Source: Ambulatory Visit | Attending: Student in an Organized Health Care Education/Training Program | Admitting: Student in an Organized Health Care Education/Training Program

## 2022-10-13 ENCOUNTER — Encounter: Payer: Self-pay | Admitting: Student in an Organized Health Care Education/Training Program

## 2022-10-13 ENCOUNTER — Ambulatory Visit
Payer: 59 | Attending: Student in an Organized Health Care Education/Training Program | Admitting: Student in an Organized Health Care Education/Training Program

## 2022-10-13 VITALS — BP 159/106 | HR 76 | Temp 98.4°F | Resp 16 | Ht 67.0 in | Wt 167.0 lb

## 2022-10-13 DIAGNOSIS — M47816 Spondylosis without myelopathy or radiculopathy, lumbar region: Secondary | ICD-10-CM | POA: Diagnosis not present

## 2022-10-13 DIAGNOSIS — G894 Chronic pain syndrome: Secondary | ICD-10-CM | POA: Insufficient documentation

## 2022-10-13 DIAGNOSIS — M961 Postlaminectomy syndrome, not elsewhere classified: Secondary | ICD-10-CM | POA: Diagnosis not present

## 2022-10-13 MED ORDER — ROPIVACAINE HCL 2 MG/ML IJ SOLN
9.0000 mL | Freq: Once | INTRAMUSCULAR | Status: AC
  Administered 2022-10-13: 9 mL via PERINEURAL

## 2022-10-13 MED ORDER — LIDOCAINE HCL 2 % IJ SOLN
20.0000 mL | Freq: Once | INTRAMUSCULAR | Status: AC
  Administered 2022-10-13: 400 mg

## 2022-10-13 MED ORDER — DEXAMETHASONE SODIUM PHOSPHATE 10 MG/ML IJ SOLN
10.0000 mg | Freq: Once | INTRAMUSCULAR | Status: AC
  Administered 2022-10-13: 10 mg

## 2022-10-13 MED ORDER — ROPIVACAINE HCL 2 MG/ML IJ SOLN
INTRAMUSCULAR | Status: AC
  Filled 2022-10-13: qty 20

## 2022-10-13 MED ORDER — DEXAMETHASONE SODIUM PHOSPHATE 10 MG/ML IJ SOLN
INTRAMUSCULAR | Status: AC
  Filled 2022-10-13: qty 2

## 2022-10-13 MED ORDER — LIDOCAINE HCL 2 % IJ SOLN
INTRAMUSCULAR | Status: AC
  Filled 2022-10-13: qty 20

## 2022-10-13 NOTE — Progress Notes (Signed)
Safety precautions to be maintained throughout the outpatient stay will include: orient to surroundings, keep bed in low position, maintain call bell within reach at all times, provide assistance with transfer out of bed and ambulation.  

## 2022-10-13 NOTE — Patient Instructions (Signed)
____________________________________________________________________________________________  Post-Procedure Discharge Instructions  Instructions:  Apply ice:   Purpose: This will minimize any swelling and discomfort after procedure.   When: Day of procedure, as soon as you get home.  How: Fill a plastic sandwich bag with crushed ice. Cover it with a [place] towel and apply to injection site.  How long: (15 min on, 15 min off) Apply for 15 minutes then remove x 15 minutes.  Repeat sequence on day of procedure, until you go to bed.  Apply heat:   Purpose: To treat any soreness and discomfort from the procedure.  When: Starting the next day after the procedure.  How: Apply heat to procedure site starting the day following the procedure.  How long: May continue to repeat daily, until discomfort goes away.  Food intake: Start with clear liquids (like water) and advance to regular food, as tolerated.   Physical activities: Keep activities to a minimum for the first 8 hours after the procedure. After that, then as tolerated.  Driving: If you have received any sedation, be responsible and do not drive. You are not allowed to drive for 24 hours after having sedation.  Blood thinner: (Applies only to those taking blood thinners) You may restart your blood thinner 6 hours after your procedure.  Insulin: (Applies only to Diabetic patients taking insulin) As soon as you can eat, you may resume your normal dosing schedule.  Infection prevention: Keep procedure site clean and dry. Shower daily and clean area with soap and water.  Post-procedure Pain Diary: Extremely important that this be done correctly and accurately. Recorded information will be used to determine the next step in treatment. For the purpose of accuracy, follow these rules:  Evaluate only the area treated. Do not report or include pain from an untreated area. For the purpose of this evaluation, ignore all other areas of pain,  except for the treated area.  After your procedure, avoid taking a long nap and attempting to complete the pain diary after you wake up. Instead, set your alarm clock to go off every hour, on the hour, for the initial 8 hours after the procedure. Document the duration of the numbing medicine, and the relief you are getting from it.  Do not go to sleep and attempt to complete it later. It will not be accurate. If you received sedation, it is likely that you were given a medication that may cause amnesia. Because of this, completing the diary at a later time may cause the information to be inaccurate. This information is needed to plan your care.  Follow-up appointment: Keep your post-procedure follow-up evaluation appointment after the procedure (usually 2 weeks for most procedures, 6 weeks for radiofrequencies). DO NOT FORGET to bring you pain diary with you.   Expect: (What should I expect to see with my procedure?)  From numbing medicine (AKA: Local Anesthetics): Numbness or decrease in pain. You may also experience some weakness, which if present, could last for the duration of the local anesthetic.  Onset: Full effect within 15 minutes of injected.  Duration: It will depend on the type of local anesthetic used. On the average, 1 to 8 hours.   From steroids (Applies only if steroids were used): Decrease in swelling or inflammation. Once inflammation is improved, relief of the pain will follow.  Onset of benefits: Depends on the amount of swelling present. The more swelling, the longer it will take for the benefits to be seen. In some cases, up to 10 days.    Duration: Steroids will stay in the system x 2 weeks. Duration of benefits will depend on multiple posibilities including persistent irritating factors.  Side-effects: If present, they may typically last 2 weeks (the duration of the steroids).  Frequent: Cramps (if they occur, drink Gatorade and take over-the-counter Magnesium 450-500 mg  once to twice a day); water retention with temporary weight gain; increases in blood sugar; decreased immune system response; increased appetite.  Occasional: Facial flushing (red, warm cheeks); mood swings; menstrual changes.  Uncommon: Long-term decrease or suppression of natural hormones; bone thinning. (These are more common with higher doses or more frequent use. This is why we prefer that our patients avoid having any injection therapies in other practices.)   Very Rare: Severe mood changes; psychosis; aseptic necrosis.  From procedure: Some discomfort is to be expected once the numbing medicine wears off. This should be minimal if ice and heat are applied as instructed.  Call if: (When should I call?)  You experience numbness and weakness that gets worse with time, as opposed to wearing off.  New onset bowel or bladder incontinence. (Applies only to procedures done in the spine)  Emergency Numbers:  Durning business hours (Monday - Thursday, 8:00 AM - 4:00 PM) (Friday, 9:00 AM - 12:00 Noon): [phone]  After hours: [phone]  NOTE: If you are having a problem and are unable connect with, or to talk to a provider, then go to your nearest urgent care or emergency department. If the problem is serious and urgent, please call 911. ____________________________________________________________________________________________    

## 2022-10-13 NOTE — Progress Notes (Signed)
Patient's Name: Ryan [name]  MRN: [id number]  Referring Provider: Saralyn Pilar *  DOB: Nov 06, 1955  PCP: Smitty Cords, DO  DOS: 10/13/2022  Note by: Edward Jolly, MD  Service setting: Ambulatory outpatient  Specialty: Interventional Pain Management  Patient type: Established  Location: [facility] (AMB) Pain Management Facility  Visit type: Interventional Procedure   Primary Reason for Visit: Interventional Pain Management Treatment. CC: Back Pain (lower)   Procedure:          Anesthesia, Analgesia, Anxiolysis:  Type: Lumbar Facet, Medial Branch Block(s) #2,  2024 Primary Purpose: Therapeutic Region: Posterolateral Lumbosacral Spine Level: L2, L3, L4, L5,  Medial Branch Level(s). Injecting these levels blocks the L3-4, L4-5 lumbar facet joints. Laterality: Bilateral   Local Anesthetic: Lidocaine 1-2%  Position: Prone   Indications: 1. Lumbar spondylosis   2. Lumbar facet arthropathy   3. Failed back surgical syndrome   4. Chronic pain syndrome    Pain Score: Pre-procedure: 7 /10 Post-procedure: 7  (standing and moving)/10  Pre-op Assessment:  Ryan [name] is a 67 y.o. (year old), male patient, seen today for interventional treatment. He  has a past surgical history that includes Eye surgery; Hernia repair; Transforaminal lumbar interbody fusion (tlif) with pedicle screw fixation 1 level (Right, 06/18/2014); Myringotomy with tube placement (Bilateral, 03/28/2015); Back surgery; Esophagogastroduodenoscopy (egd) with propofol (N/A, 10/24/2015); Cataract extraction w/PHACO (Left, 01/06/2016); Tonsillectomy; Colonoscopy with propofol (N/A, 04/19/2016); and Sinusotomy (02/28/2019). Ryan [name] has a current medication list which includes the following prescription(s): albuterol, albuterol, alprazolam, atorvastatin, gabapentin, lisinopril, oxycodone-acetaminophen, sildenafil, tamsulosin, and tizanidine. His primarily concern today is the Back Pain (lower)   Initial Vital Signs:   Pulse/HCG Rate: 77  Temp: 98.4 F (36.9 C) Resp: 18 BP: (!) 167/95 SpO2: 97 %  BMI: Estimated body mass index is 26.16 kg/m as calculated from the following:   Height as of this encounter: 5\' 7"  (1.702 m).   Weight as of this encounter: 167 lb (75.8 kg).  Risk Assessment: Allergies: Reviewed. He has No Known Allergies.  Allergy Precautions: None required Coagulopathies: Reviewed. None identified.  Blood-thinner therapy: None at this time Active Infection(s): Reviewed. None identified. Ryan [name] is afebrile  Site Confirmation: Ryan [name] was asked to confirm the procedure and laterality before marking the site Procedure checklist: Completed Consent: Before the procedure and under the influence of no sedative(s), amnesic(s), or anxiolytics, the patient was informed of the treatment options, risks and possible complications. To fulfill our ethical and legal obligations, as recommended by the American Medical Association's Code of Ethics, I have informed the patient of my clinical impression; the nature and purpose of the treatment or procedure; the risks, benefits, and possible complications of the intervention; the alternatives, including doing nothing; the risk(s) and benefit(s) of the alternative treatment(s) or procedure(s); and the risk(s) and benefit(s) of doing nothing. The patient was provided information about the general risks and possible complications associated with the procedure. These may include, but are not limited to: failure to achieve desired goals, infection, bleeding, organ or nerve damage, allergic reactions, paralysis, and death. In addition, the patient was informed of those risks and complications associated to Spine-related procedures, such as failure to decrease pain; infection (i.e.: Meningitis, epidural or intraspinal abscess); bleeding (i.e.: epidural hematoma, subarachnoid hemorrhage, or any other type of intraspinal or peri-dural bleeding); organ or nerve damage  (i.e.: Any type of peripheral nerve, nerve root, or spinal cord injury) with subsequent damage to sensory, motor, and/or autonomic systems, resulting in permanent pain, numbness,  and/or weakness of one or several areas of the body; allergic reactions; (i.e.: anaphylactic reaction); and/or death. Furthermore, the patient was informed of those risks and complications associated with the medications. These include, but are not limited to: allergic reactions (i.e.: anaphylactic or anaphylactoid reaction(s)); adrenal axis suppression; blood sugar elevation that in diabetics may result in ketoacidosis or comma; water retention that in patients with history of congestive heart failure may result in shortness of breath, pulmonary edema, and decompensation with resultant heart failure; weight gain; swelling or edema; medication-induced neural toxicity; particulate matter embolism and blood vessel occlusion with resultant organ, and/or nervous system infarction; and/or aseptic necrosis of one or more joints. Finally, the patient was informed that Medicine is not an exact science; therefore, there is also the possibility of unforeseen or unpredictable risks and/or possible complications that may result in a catastrophic outcome. The patient indicated having understood very clearly. We have given the patient no guarantees and we have made no promises. Enough time was given to the patient to ask questions, all of which were answered to the patient's satisfaction. Ryan [name] has indicated that he wanted to continue with the procedure. Attestation: I, the ordering provider, attest that I have discussed with the patient the benefits, risks, side-effects, alternatives, likelihood of achieving goals, and potential problems during recovery for the procedure that I have provided informed consent. Date  Time: 10/13/2022  7:59 AM  Pre-Procedure Preparation:  Monitoring: As per clinic protocol. Respiration, ETCO2, SpO2, BP, heart  rate and rhythm monitor placed and checked for adequate function Safety Precautions: Patient was assessed for positional comfort and pressure points before starting the procedure. Time-out: I initiated and conducted the "Time-out" before starting the procedure, as per protocol. The patient was asked to participate by confirming the accuracy of the "Time Out" information. Verification of the correct person, site, and procedure were performed and confirmed by me, the nursing staff, and the patient. "Time-out" conducted as per Joint Commission's Universal Protocol (UP.01.01.01). Time: 0826  Description of Procedure:          Laterality: Bilateral. The procedure was performed in identical fashion on both sides. Levels:  L3, L4, L5,  Medial Branch Level(s) Area Prepped: Posterior Lumbosacral Region Prepping solution: ChloraPrep (2% chlorhexidine gluconate and 70% isopropyl alcohol) Safety Precautions: Aspiration looking for blood return was conducted prior to all injections. At no point did we inject any substances, as a needle was being advanced. Before injecting, the patient was told to immediately notify me if he was experiencing any new onset of "ringing in the ears, or metallic taste in the mouth". No attempts were made at seeking any paresthesias. Safe injection practices and needle disposal techniques used. Medications properly checked for expiration dates. SDV (single dose vial) medications used. After the completion of the procedure, all disposable equipment used was discarded in the proper designated medical waste containers. Local Anesthesia: Protocol guidelines were followed. The patient was positioned over the fluoroscopy table. The area was prepped in the usual manner. The time-out was completed. The target area was identified using fluoroscopy. A 12-in long, straight, sterile hemostat was used with fluoroscopic guidance to locate the targets for each level blocked. Once located, the skin was  marked with an approved surgical skin marker. Once all sites were marked, the skin (epidermis, dermis, and hypodermis), as well as deeper tissues (fat, connective tissue and muscle) were infiltrated with a Eberlein amount of a short-acting local anesthetic, loaded on a 10cc syringe with a 25G, 1.5-in  Needle. An appropriate amount of time was allowed for local anesthetics to take effect before proceeding to the next step. Local Anesthetic: Lidocaine 2.0% The unused portion of the local anesthetic was discarded in the proper designated containers. Technical explanation of process:   L2 Medial Branch Nerve Block (MBB): The target area for the L3 medial branch is at the junction of the postero-lateral aspect of the superior articular process and the superior, posterior, and medial edge of the transverse process of L3. Under fluoroscopic guidance, a Quincke needle was inserted until contact was made with os over the superior postero-lateral aspect of the pedicular shadow (target area). After negative aspiration for blood,1 cc  mL of the nerve block solution was injected without difficulty or complication. The needle was removed intact. L3 Medial Branch Nerve Block (MBB): The target area for the L3 medial branch is at the junction of the postero-lateral aspect of the superior articular process and the superior, posterior, and medial edge of the transverse process of L4. Under fluoroscopic guidance, a Quincke needle was inserted until contact was made with os over the superior postero-lateral aspect of the pedicular shadow (target area). After negative aspiration for blood,1 cc  mL of the nerve block solution was injected without difficulty or complication. The needle was removed intact. L4 Medial Branch Nerve Block (MBB): The target area for the L4 medial branch is at the junction of the postero-lateral aspect of the superior articular process and the superior, posterior, and medial edge of the transverse process of  L5. Under fluoroscopic guidance, a Quincke needle was inserted until contact was made with os over the superior postero-lateral aspect of the pedicular shadow (target area). After negative aspiration for blood,1cc mL of the nerve block solution was injected without difficulty or complication. The needle was removed intact. L5 Medial Branch Nerve Block (MBB): The target area for the L5 medial branch is at the junction of the postero-lateral aspect of the superior articular process and the superior, posterior, and medial edge of the sacral ala. Under fluoroscopic guidance, a Quincke needle was inserted until contact was made with os over the superior postero-lateral aspect of the pedicular shadow (target area). After negative aspiration for blood,1cc  mL of the nerve block solution was injected without difficulty or complication. The needle was removed intact.   Nerve block solution: 10 cc solution made of 8 cc of 0.2% ropivacaine, 2 cc of Decadron 10 mg/cc.  1-1.5 cc injected at each level above bilaterally  Procedural Needles: 22-gauge, 3.5-inch, Quincke needles used for all levels.  Once the entire procedure was completed, the treated area was cleaned, making sure to leave some of the prepping solution back to take advantage of its long term bactericidal properties.   Illustration of the posterior view of the lumbar spine and the posterior neural structures. Laminae of L2 through S1 are labeled. DPRL5, dorsal primary ramus of L5; DPRS1, dorsal primary ramus of S1; DPR3, dorsal primary ramus of L3; FJ, facet (zygapophyseal) joint L3-L4; I, inferior articular process of L4; LB1, lateral branch of dorsal primary ramus of L1; IAB, inferior articular branches from L3 medial branch (supplies L4-L5 facet joint); IBP, intermediate branch plexus; MB3, medial branch of dorsal primary ramus of L3; NR3, third lumbar nerve root; S, superior articular process of L5; SAB, superior articular branches from L4 (supplies  L4-5 facet joint also); TP3, transverse process of L3.  Vitals:   10/13/22 0801 10/13/22 0820 10/13/22 0830 10/13/22 0840  BP: (!) 167/95 (!) 175/106 Marland Kitchen)  164/108 (!) 159/106  Pulse: 77 79 78 76  Resp:  18 16 16   Temp: 98.4 F (36.9 C)     TempSrc: Temporal     SpO2: 97% 99% 100% 100%  Weight: 167 lb (75.8 kg)     Height: 5\' 7"  (1.702 m)        Start Time: 0826 hrs. End Time: 0836 hrs.  Imaging Guidance (Spinal):          Type of Imaging Technique: Fluoroscopy Guidance (Spinal) Indication(s): Assistance in needle guidance and placement for procedures requiring needle placement in or near specific anatomical locations not easily accessible without such assistance. Exposure Time: Please see nurses notes. Contrast: None used. Fluoroscopic Guidance: I was personally present during the use of fluoroscopy. "Tunnel Vision Technique" used to obtain the best possible view of the target area. Parallax error corrected before commencing the procedure. "Direction-depth-direction" technique used to introduce the needle under continuous pulsed fluoroscopy. Once target was reached, antero-posterior, oblique, and lateral fluoroscopic projection used confirm needle placement in all planes. Images permanently stored in EMR. Interpretation: No contrast injected. I personally interpreted the imaging intraoperatively. Adequate needle placement confirmed in multiple planes. Permanent images saved into the patient's record.   Post-operative Assessment:  Post-procedure Vital Signs:  Pulse/HCG Rate: 76  Temp: 98.4 F (36.9 C) Resp: 16 BP: (!) 159/106 SpO2: 100 %  EBL: None  Complications: No immediate post-treatment complications observed by team, or reported by patient.  Note: The patient tolerated the entire procedure well. A repeat set of vitals were taken after the procedure and the patient was kept under observation following institutional policy, for this type of procedure. Post-procedural  neurological assessment was performed, showing return to baseline, prior to discharge. The patient was provided with post-procedure discharge instructions, including a section on how to identify potential problems. Should any problems arise concerning this procedure, the patient was given instructions to immediately contact us, at any time, without hesitation. In any case, we plan to contact the patient by telephone for a follow-up status report regarding this interventional procedure.  Comments:  No additional relevant information.  Plan of Care   Consider spinal cord stimulator  Imaging Orders         DG PAIN CLINIC C-ARM 1-60 MIN NO REPORT     Medications ordered for procedure: Meds ordered this encounter  Medications   lidocaine (XYLOCAINE) 2 % (with pres) injection 400 mg   dexamethasone (DECADRON) injection 10 mg   dexamethasone (DECADRON) injection 10 mg   ropivacaine (PF) 2 mg/mL (0.2%) (NAROPIN) injection 9 mL   ropivacaine (PF) 2 mg/mL (0.2%) (NAROPIN) injection 9 mL   Medications administered: We administered lidocaine, dexamethasone, dexamethasone, ropivacaine (PF) 2 mg/mL (0.2%), and ropivacaine (PF) 2 mg/mL (0.2%).  See the medical record for exact dosing, route, and time of administration.  Disposition: Discharge home  Discharge Date & Time: 10/13/2022; 0843 hrs.   Physician-requested Follow-up: Return in about 6 weeks (around 11/24/2022) for PPE, VV.  Future Appointments  Date Time Provider Department Center  11/29/2022  3:00 PM Edward Jolly, MD [facility] None  07/01/2023  1:00 PM [facility]-ANNUAL WELLNESS VISIT [facility]    Primary Care Physician: Smitty Cords, DO Location: [facility] Note by: Edward Jolly, MD Date: 10/13/2022; Time: 9:14 AM  Disclaimer:  Medicine is not an exact science. The only guarantee in medicine is that nothing is guaranteed. It is important to note that the decision to proceed with this  intervention was  based on the information collected from the patient. The Data and conclusions were drawn from the patient's questionnaire, the interview, and the physical examination. Because the information was provided in large part by the patient, it cannot be guaranteed that it has not been purposely or unconsciously manipulated. Every effort has been made to obtain as much relevant data as possible for this evaluation. It is important to note that the conclusions that lead to this procedure are derived in large part from the available data. Always take into account that the treatment will also be dependent on availability of resources and existing treatment guidelines, considered by other Pain Management Practitioners as being common knowledge and practice, at the time of the intervention. For Medico-Legal purposes, it is also important to point out that variation in procedural techniques and pharmacological choices are the acceptable norm. The indications, contraindications, technique, and results of the above procedure should only be interpreted and judged by a Board-Certified Interventional Pain Specialist with extensive familiarity and expertise in the same exact procedure and technique.

## 2022-10-14 ENCOUNTER — Telehealth: Payer: Self-pay

## 2022-10-14 NOTE — Telephone Encounter (Signed)
Post procedure follow up.  Patient states he is doing fine

## 2022-10-25 ENCOUNTER — Ambulatory Visit: Payer: Self-pay

## 2022-10-25 DIAGNOSIS — M5416 Radiculopathy, lumbar region: Secondary | ICD-10-CM

## 2022-10-25 DIAGNOSIS — M62830 Muscle spasm of back: Secondary | ICD-10-CM

## 2022-10-25 DIAGNOSIS — M542 Cervicalgia: Secondary | ICD-10-CM
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
## 2022-10-26 NOTE — Addendum Note (Signed)
Addended by: Smitty Cords on: 10/26/2022 01:13 PM   Modules accepted: Orders

## 2022-11-02 ENCOUNTER — Emergency Department
Admission: EM | Admit: 2022-11-02 | Discharge: 2022-11-02 | Disposition: A | Discharge status: Home / Self Care | Payer: 59 | Attending: Emergency Medicine | Admitting: Emergency Medicine

## 2022-11-02 ENCOUNTER — Other Ambulatory Visit: Payer: Self-pay

## 2022-11-02 DIAGNOSIS — M542 Cervicalgia: Secondary | ICD-10-CM | POA: Diagnosis not present

## 2022-11-02 DIAGNOSIS — G8929 Other chronic pain: Secondary | ICD-10-CM | POA: Diagnosis not present

## 2022-11-02 DIAGNOSIS — M545 Low back pain, unspecified: Secondary | ICD-10-CM | POA: Diagnosis present

## 2022-11-02 DIAGNOSIS — M5440 Lumbago with sciatica, unspecified side: Secondary | ICD-10-CM | POA: Insufficient documentation

## 2022-11-02 DIAGNOSIS — M5442 Lumbago with sciatica, left side: Secondary | ICD-10-CM | POA: Diagnosis not present

## 2022-11-02 MED ORDER — OXYCODONE-ACETAMINOPHEN 5-325 MG PO TABS
1.0000 | ORAL_TABLET | Freq: Once | ORAL | Status: AC
  Administered 2022-11-02: 1 via ORAL
  Filled 2022-11-02: qty 1

## 2022-11-02 MED ORDER — BACLOFEN 10 MG PO TABS: 10.0000 mg | ORAL_TABLET | Freq: Three times a day (TID) | ORAL | 0 refills | Status: AC

## 2022-11-02 MED ORDER — CYCLOBENZAPRINE HCL 10 MG PO TABS
10.0000 mg | ORAL_TABLET | Freq: Once | ORAL | Status: AC
  Administered 2022-11-02: 10 mg via ORAL
  Filled 2022-11-02: qty 1

## 2022-11-02 MED ORDER — DEXAMETHASONE SODIUM PHOSPHATE 10 MG/ML IJ SOLN
10.0000 mg | Freq: Once | INTRAMUSCULAR | Status: AC
  Administered 2022-11-02: 10 mg via INTRAMUSCULAR
  Filled 2022-11-02: qty 1

## 2022-11-02 MED ORDER — OXYCODONE-ACETAMINOPHEN 5-325 MG PO TABS: 1.0000 | ORAL_TABLET | ORAL | 0 refills | Status: DC | PRN

## 2022-11-02 MED ORDER — PREDNISONE 10 MG (21) PO TBPK: ORAL_TABLET | ORAL | 0 refills | Status: DC
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
## 2022-11-02 NOTE — ED Notes (Signed)
Pt verbalizes understanding of discharge instructions. Opportunity for questioning and answers were provided. Pt discharged from ED to home with girlfriend.

## 2022-11-02 NOTE — Telephone Encounter (Signed)
Received a notification that patient arrived back to ED again today 11/02/22 for back pain.  He did not notify our office that he was going to ED.  Last contact with our office on 10/25/22 we pursued his request for referral to Physical therapy. Referral was submitted to [facility] via fax on 10/1  I do not see Physical Therapy scheduled yet.  I will route this note to [facility] for review of the referral process and assist with scheduling if able.  The goal is to avoid return trips to ED, and to get him established with therapy and continue with Pain Management specialist.  Saralyn Pilar, DO [facility] 11/02/2022, 5:56 PM

## 2022-11-02 NOTE — Discharge Instructions (Signed)
Follow up with your regular doctor call for an appointment Follow up with orthopedics for physical therapy

## 2022-11-09 ENCOUNTER — Telehealth: Payer: Self-pay | Admitting: *Deleted

## 2022-11-09 DIAGNOSIS — M4726 Other spondylosis with radiculopathy, lumbar region: Secondary | ICD-10-CM | POA: Diagnosis not present

## 2022-11-09 DIAGNOSIS — M5431 Sciatica, right side: Secondary | ICD-10-CM | POA: Diagnosis not present

## 2022-11-09 NOTE — Progress Notes (Signed)
Care Coordination   Note   11/09/2022 Name: [name] MRN: [id number] DOB: October 08, 1955  [name] is a 67 y.o. year old male who sees Smitty Cords, DO for primary care. I reached out to [name] by phone today to offer care coordination services.  [name] was given information about Care Coordination services today including:   The Care Coordination services include support from the care team which includes your Nurse Coordinator, Clinical Social Worker, or Pharmacist.  The Care Coordination team is here to help remove barriers to the health concerns and goals most important to you. Care Coordination services are voluntary, and the patient may decline or stop services at any time by request to their care team member.   Care Coordination Consent Status: Patient agreed to services and verbal consent obtained.   Follow up plan:  Telephone appointment with care coordination team member scheduled for:  11/10/2022  Encounter Outcome:  Patient Scheduled  [name], [facility] Care Coordination Care Guide Direct Dial: [phone]

## 2022-11-10 ENCOUNTER — Ambulatory Visit: Payer: Self-pay | Admitting: *Deleted

## 2022-11-10 NOTE — Patient Instructions (Signed)
Visit Information  Thank you for taking time to visit with me today. Please don't hesitate to contact me if I can be of assistance to you.   Following are the goals we discussed today:   Goals Addressed             This Visit's Progress    Patient Stated       Activities and task to complete in order to accomplish goals.   EMOTIONAL / MENTAL HEALTH SUPPORT Keep all upcoming appointment discussed today Self Support options  (continue to follow up with current mental health providers, request assistance with arranging a in home aid for family member to assist with her care)         Our next appointment is by telephone on 11/24/22 at 11am  Please call the care guide team at [phone] if you need to cancel or reschedule your appointment.   If you are experiencing a Mental Health or Behavioral Health Crisis or need someone to talk to, please call the Suicide and Crisis Lifeline: 988   Patient verbalizes understanding of instructions and care plan provided today and agrees to view in MyChart. Active MyChart status and patient understanding of how to access instructions and care plan via MyChart confirmed with patient.     Telephone follow up appointment with care management team member scheduled for: 11/24/22  Verna Czech, LCSW [place]  [facility] Licensed Clinical Social Worker Care Coordinator  Direct Dial: [phone]

## 2022-11-10 NOTE — Patient Outreach (Signed)
Care Coordination   Initial Visit Note   11/10/2022 Name: Ryan [name] MRN: [id number] DOB: 1955/04/24  Ryan [name] is a 67 y.o. year old male who sees Smitty Cords, DO for primary care. I spoke with  [name] by phone today.  What matters to the patients health and wellness today?  EMMI referral received, patient assessed   Goals Addressed             This Visit's Progress    Patient Stated       Activities and task to complete in order to accomplish goals.   EMOTIONAL / MENTAL HEALTH SUPPORT Keep all upcoming appointment discussed today Self Support options  (continue to follow up with current mental health providers, request assistance with arranging a in home aid for family member to assist with her care)         SDOH assessments and interventions completed:  Yes  SDOH Interventions Today    Flowsheet Row Most Recent Value  SDOH Interventions   Food Insecurity Interventions Intervention Not Indicated  Housing Interventions Intervention Not Indicated  Transportation Interventions Intervention Not Indicated  Depression Interventions/Treatment  Counseling        Care Coordination Interventions:  Yes, provided  Interventions Today    Flowsheet Row Most Recent Value  Chronic Disease   Chronic disease during today's visit Other  [DDD, symptoms of anxiety and depression]  General Interventions   General Interventions Discussed/Reviewed General Interventions Discussed  Education Interventions   Education Provided Provided Education  Provided Verbal Education On [employer] 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

## 2022-11-11 ENCOUNTER — Ambulatory Visit: Payer: 59 | Admitting: Student in an Organized Health Care Education/Training Program

## 2022-11-12 ENCOUNTER — Inpatient Hospital Stay: Payer: 59 | Admitting: Family Medicine

## 2022-11-17 ENCOUNTER — Other Ambulatory Visit: Payer: Self-pay | Admitting: Family Medicine

## 2022-11-17 DIAGNOSIS — E782 Mixed hyperlipidemia: Secondary | ICD-10-CM

## 2022-11-17 DIAGNOSIS — I10 Essential (primary) hypertension: Secondary | ICD-10-CM

## 2022-11-19 ENCOUNTER — Ambulatory Visit (INDEPENDENT_AMBULATORY_CARE_PROVIDER_SITE_OTHER): Payer: 59 | Admitting: Family Medicine

## 2022-11-19 ENCOUNTER — Encounter: Payer: Self-pay | Admitting: Family Medicine

## 2022-11-19 VITALS — BP 118/72 | HR 82 | Ht 67.0 in | Wt 164.0 lb

## 2022-11-19 DIAGNOSIS — M5416 Radiculopathy, lumbar region: Secondary | ICD-10-CM

## 2022-11-19 DIAGNOSIS — J432 Centrilobular emphysema: Secondary | ICD-10-CM | POA: Diagnosis not present

## 2022-11-19 DIAGNOSIS — J440 Chronic obstructive pulmonary disease with acute lower respiratory infection: Secondary | ICD-10-CM

## 2022-11-19 DIAGNOSIS — M545 Low back pain, unspecified: Secondary | ICD-10-CM

## 2022-11-19 DIAGNOSIS — G8929 Other chronic pain: Secondary | ICD-10-CM | POA: Diagnosis not present

## 2022-11-19 DIAGNOSIS — M5137 Other intervertebral disc degeneration, lumbosacral region with discogenic back pain only: Secondary | ICD-10-CM | POA: Diagnosis not present

## 2022-11-19 DIAGNOSIS — Z8673 Personal history of transient ischemic attack (TIA), and cerebral infarction without residual deficits: Secondary | ICD-10-CM | POA: Diagnosis not present

## 2022-11-19 DIAGNOSIS — J209 Acute bronchitis, unspecified: Secondary | ICD-10-CM

## 2022-11-19 MED ORDER — PREDNISONE 20 MG PO TABS: ORAL_TABLET | ORAL | 0 refills | Status: DC

## 2022-11-19 MED ORDER — ALBUTEROL SULFATE HFA 108 (90 BASE) MCG/ACT IN AERS
2.0000 | INHALATION_SPRAY | Freq: Four times a day (QID) | RESPIRATORY_TRACT | 3 refills | Status: DC | PRN
Start: 2022-11-19 — End: 2022-12-28

## 2022-11-19 MED ORDER — OXYCODONE-ACETAMINOPHEN 5-325 MG PO TABS
1.0000 | ORAL_TABLET | ORAL | 0 refills | Status: AC | PRN
Start: 2022-11-19 — End: 2022-11-24

## 2022-11-19 MED ORDER — LEVOFLOXACIN 500 MG PO TABS
500.0000 mg | ORAL_TABLET | Freq: Every day | ORAL | 0 refills | Status: DC
Start: 2022-11-19 — End: 2022-12-28

## 2022-11-19 NOTE — Telephone Encounter (Signed)
Requested medications are due for refill today.  yes  Requested medications are on the active medications list.  yes  Last refill. 11/18/2021 1 year supply on both  Future visit scheduled.   today  Notes to clinic.  Pt has appt with provider today. Please review for refill.     Requested Prescriptions  Pending Prescriptions Disp Refills   atorvastatin (LIPITOR) 80 MG tablet [Pharmacy Med Name: Atorvastatin Calcium 80 MG Oral Tablet] 90 tablet 0    Sig: TAKE 1 TABLET BY MOUTH AT BEDTIME     Cardiovascular:  Antilipid - Statins Failed - 11/17/2022 11:10 AM      Failed - Lipid Panel in normal range within the last 12 months    Cholesterol  Date Value Ref Range Status  11/30/2021 84 0 - 200 mg/dL Final   LDL Cholesterol (Calc)  Date Value Ref Range Status  11/18/2021 36 mg/dL (calc) Final    Comment:    Reference range: <100 . Desirable range <100 mg/dL for primary prevention;   <70 mg/dL for patients with CHD or diabetic patients  with > or = 2 CHD risk factors. Marland Kitchen LDL-C is now calculated using the Martin-Hopkins  calculation, which is a validated novel method providing  better accuracy than the Friedewald equation in the  estimation of LDL-C.  Horald Pollen et al. Lenox Ahr. 2130;865(78): 2061-2068  (http://education.[employer].com/faq/FAQ164)    LDL Cholesterol  Date Value Ref Range Status  11/30/2021 39 0 - 99 mg/dL Final    Comment:           Total Cholesterol/HDL:CHD Risk Coronary Heart Disease Risk Table                     Men   Women  1/2 Average Risk   3.4   3.3  Average Risk       5.0   4.4  2 X Average Risk   9.6   7.1  3 X Average Risk  23.4   11.0        Use the calculated Patient Ratio above and the CHD Risk Table to determine the patient's CHD Risk.        ATP III CLASSIFICATION (LDL):  <100     mg/dL   Optimal  469-629  mg/dL   Near or Above                    Optimal  130-159  mg/dL   Borderline  528-413  mg/dL   High  >244     mg/dL   Very  High Performed at [facility], [address] [place], [place] [address]    HDL  Date Value Ref Range Status  11/30/2021 40 (L) >40 mg/dL Final   Triglycerides  Date Value Ref Range Status  11/30/2021 26 <150 mg/dL Final         Passed - Patient is not pregnant      Passed - Valid encounter within last 12 months    Recent Outpatient Visits           1 month ago Lumbar radiculopathy   [place] [facility] Smitty Cords, DO   9 months ago Cellulitis, umbilical   [place] [facility] Smitty Cords, DO   1 year ago Annual physical exam   [place] [facility] Smitty Cords, DO   1 year ago Ear pain, left   [place]  [facility] Mecum, Oswaldo Conroy, PA-C   1 year ago [facility] [employer] preventive visit   [place] [facility] [place], Netta Neat, DO       Future Appointments             Today Althea Charon Netta Neat, DO [place] [facility]             lisinopril (ZESTRIL) 20 MG tablet [place] [facility] Name: Lisinopril 20 MG Oral Tablet] 90 tablet 0    Sig: Take 1 tablet by mouth once daily     Cardiovascular:  ACE Inhibitors Failed - 11/17/2022 11:10 AM      Failed - Cr in normal range and within 180 days    Creat  Date Value Ref Range Status  11/18/2021 0.98 0.70 - 1.35 mg/dL Final   Creatinine, Ser  Date Value Ref Range Status  11/30/2021 0.75 0.61 - 1.24 mg/dL Final         Failed - K in normal range and within 180 days    Potassium  Date Value Ref Range Status  11/30/2021 3.5 3.5 - 5.1 mmol/L Final  07/15/2011 4.3 3.5 - 5.1 mmol/L Final         Failed - Last BP in normal range    BP Readings from Last 1 Encounters:  11/02/22 (!) 137/98         Passed - Patient is not pregnant      Passed - Valid encounter within last 6 months    Recent Outpatient Visits           1 month ago Lumbar  radiculopathy   [place] [facility] Smitty Cords, DO   9 months ago Cellulitis, umbilical   [place] [facility] Smitty Cords, DO   1 year ago Annual physical exam   [place] [facility] Smitty Cords, DO   1 year ago Ear pain, left   [place] [facility] Mecum, Oswaldo Conroy, [place]   1 year ago [facility] [employer] preventive visit   [place] [facility] [place], Netta Neat, DO       Future Appointments             Today Althea Charon, Netta Neat, DO [place] [facility]
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
## 2022-11-19 NOTE — Patient Instructions (Addendum)
Thank you for coming to the office today.  Refilled medications  Oxycodone refilled  Re order Albuterol inhaler rescue  Start taking Levaquin antibiotic 500mg  daily x 7 days  Prednisone taper 7 days  Please schedule a Follow-up Appointment to: Return in about 4 weeks (around 12/17/2022) for 4 weeks follow-up back pain / COPD.  If you have any other questions or concerns, please feel free to call the office or send a message through MyChart. You may also schedule an earlier appointment if necessary.  Additionally, you may be receiving a survey about your experience at our office within a few days to 1 week by e-mail or mail. We value your feedback.  Saralyn Pilar, DO [facility], [place]

## 2022-11-22 ENCOUNTER — Other Ambulatory Visit: Payer: 59 | Admitting: Pharmacist

## 2022-11-22 DIAGNOSIS — J432 Centrilobular emphysema: Secondary | ICD-10-CM

## 2022-11-22 MED ORDER — BREZTRI AEROSPHERE 160-9-4.8 MCG/ACT IN AERO
2.0000 | INHALATION_SPRAY | Freq: Two times a day (BID) | RESPIRATORY_TRACT | 0 refills | Status: DC
Start: 2022-11-22 — End: 2022-12-28

## 2022-11-22 NOTE — Progress Notes (Unsigned)
11/22/2022 Name: Ryan [name] MRN: [id number] DOB: 11-Jul-1955  Chief Complaint  Patient presents with   Medication Access    Ryan [name] is a 67 y.o. year old male who presented for a telephone visit.   They were referred to the pharmacist by their PCP for assistance in managing medication access.    Subjective:  Care Team: Primary Care Provider: Smitty Cords, DO ; Next Scheduled Visit: 12/16/2022 Pain Management: Edward Jolly, MD; Next Scheduled Visit: 12/13/2022 Orthopedics: Latanya Maudlin, PA; Next Scheduled Visit: 12/08/2022 Social Worker: Wenda [place], [place]; Next Scheduled Visit: 11/24/2022  Medication Access/Adherence  Current Pharmacy:  [facility] [address] ([address]),  - [address] [place]) [place] [address] Phone: [phone] Fax: [phone]   Patient reports affordability concerns with their medications: Yes  Patient reports access/transportation concerns to their pharmacy: No  Patient reports adherence concerns with their medications:  No    From review of chart, note patient seen for Office Visit with PCP on 11/19/2022 and advised to start Levaquin and steroid taper for COPD flare   COPD:  Current medications: - albuterol HFA - 2 puffs every 6 hours as needed for wheezing/shortness of breath - levofloxacin 500 mg daily for 7 days - prednisone 20 mg - Take daily with food. Start with 60mg  (3 pills) x 2 days, then reduce to 40mg  (2 pills) x 2 days, then 20mg  (1 pill) x 3 days   Per referral/patient reports not currently on maintenance inhaler therapy as this has been unaffordable in the past.   Objective:   Lab Results  Component Value Date   CREATININE 0.75 11/30/2021   BUN 12 11/30/2021   NA 137 11/30/2021   K 3.5 11/30/2021   CL 104 11/30/2021   CO2 24 11/30/2021    Current Outpatient Medications on File Prior to Visit  Medication Sig Dispense Refill   albuterol  (PROVENTIL) (2.5 MG/3ML) 0.083% nebulizer solution Take 3 mLs (2.5 mg total) by nebulization every 6 (six) hours as needed for wheezing or shortness of breath. 150 mL 5   albuterol (VENTOLIN HFA) 108 (90 Base) MCG/ACT inhaler Inhale 2 puffs into the lungs every 6 (six) hours as needed for wheezing or shortness of breath. 8 g 3   ALPRAZolam (XANAX) 1 MG tablet Take 1 tablet (1 mg total) by mouth 2 (two) times daily as needed. 20 tablet 0   atorvastatin (LIPITOR) 80 MG tablet TAKE 1 TABLET BY MOUTH AT BEDTIME 90 tablet 1   gabapentin (NEURONTIN) 400 MG capsule Take 400 mg by mouth 3 (three) times daily. Per psychiatry     levofloxacin (LEVAQUIN) 500 MG tablet Take 1 tablet (500 mg total) by mouth daily. For 7 days 7 tablet 0   lisinopril (ZESTRIL) 20 MG tablet Take 1 tablet by mouth once daily 90 tablet 1   oxyCODONE-acetaminophen (PERCOCET/ROXICET) 5-325 MG tablet Take 1 tablet by mouth every 4 (four) hours as needed for up to 5 days. 30 tablet 0   predniSONE (DELTASONE) 20 MG tablet Take daily with food. Start with 60mg  (3 pills) x 2 days, then reduce to 40mg  (2 pills) x 2 days, then 20mg  (1 pill) x 3 days 13 tablet 0   sildenafil (REVATIO) 20 MG tablet Take 4-5 tablets (80-100 mg total) by mouth as needed (ED). 60 tablet 5   tamsulosin (FLOMAX) 0.4 MG CAPS capsule Take 1 capsule (0.4 mg total) by mouth in the morning and at  bedtime. 180 capsule 3   tiZANidine (ZANAFLEX) 4 MG tablet Take 1 tablet (4 mg total) by mouth every 8 (eight) hours as needed for muscle spasms. 90 tablet 2   No current facility-administered medications on file prior to visit.     Assessment/Plan:   COPD: - From review of formulary from [employer] note several maintenance inhaler therapy options covered through patient's Dual Complete plan Collaborate with PCP regarding covered options. Provider advises to order BREZTRI AEROSPHERE 160-9-4.8 MCG/ACT AERO - Inhale 2 puffs into the lungs 2 (two) times daily -  Order sent to pharmacy. Follow up with [facility] and confirm [facility] inhaler covered through patient's plan for not charge to patient - Follow up with patient and review appropriate inhaler technique. Also recommend patient request further counseling on inhaler from [facility] when picks up his prescription   Follow Up Plan:   Patient denies further medication questions or concerns today Provide patient with contact information for clinic pharmacist to contact if needed in future for medication questions/concerns  Estelle Grumbles, PharmD, Patsy [place], CPP Clinical Pharmacist [facility] [phone]

## 2022-11-23 ENCOUNTER — Telehealth: Payer: Self-pay | Admitting: *Deleted

## 2022-11-23 NOTE — Patient Instructions (Signed)
Goals Addressed             This Visit's Progress    Pharmacy Goals       Reminders for using your Breztri inhaler:  Please remember to prime your inhaler before using it for the first time. To do this, remove the cap from the mouthpiece, hold it upright, facing away from you, and shake well. Push the canister all the way down until it stops moving in the actuator to release a puff of medicine from the mouthpiece. You may hear a soft click from the dose indicator as it counts down. Repeat this process three times more.  To use the inhaler, take the cap off and shake the primed inhaler. Exhale as much as you reasonably can through your mouth, then close your lips around the mouthpiece and tilt your head back, keeping your tongue below the mouthpiece. Inhale deeply and slowly while pressing down on the canister until it stops moving. You should feel a puff of medicine. Remove the inhaler while holding your breath for as long as you comfortably can, up to 10 seconds, before breathing out gently. Repeat this process once more and replace the cap. You should be taking 2 puffs, 2 times a day. After your second puff, rinse your mouth with water to remove any excess medicine, making sure you don't swallow the water.    Thank you!  Estelle Grumbles, PharmD, Patsy [place], CPP Clinical Pharmacist [facility] [phone]

## 2022-11-23 NOTE — Progress Notes (Signed)
Care Coordination Note  11/23/2022 Name: Ryan [name] MRN: [id number] DOB: Mar 21, 1955  Ryan [name] is a 67 y.o. year old male who is a primary care patient of Smitty Cords, DO and is actively engaged with the care management team. I reached out to [name] by phone today to assist with scheduling an initial visit with the RN Case Manager  Follow up plan: Telephone appointment with care management team member scheduled for: 11/29/2022  [name], [facility] Care Coordination Care Guide Direct Dial: [phone]

## 2022-11-24 ENCOUNTER — Ambulatory Visit: Payer: Self-pay | Admitting: *Deleted
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
## 2022-11-24 NOTE — Patient Instructions (Signed)
Visit Information  Thank you for taking time to visit with me today. Please don't hesitate to contact me if I can be of assistance to you.   Following are the goals we discussed today:   Goals Addressed             This Visit's Progress    Mental health -community resource support       Activities and task to complete in order to accomplish goals.   EMOTIONAL / MENTAL HEALTH SUPPORT Keep all upcoming appointment discussed today Self Support options  (continue to follow up with current mental health providers to assist with symptom management,  please provide paperwork to family members MD to request assessment for personal care assistance, please continue to utilize self care strategies discussed today)         Our next appointment is by telephone on 12/08/22 at 11am  Please call the care guide team at [phone] if you need to cancel or reschedule your appointment.   If you are experiencing a Mental Health or Behavioral Health Crisis or need someone to talk to, please call the Suicide and Crisis Lifeline: 988   Patient verbalizes understanding of instructions and care plan provided today and agrees to view in MyChart. Active MyChart status and patient understanding of how to access instructions and care plan via MyChart confirmed with patient.     Telephone follow up appointment with care management team member scheduled for: 12/08/22   Verna Czech, LCSW [place]  [facility], [facility] Licensed Clinical Social Worker Care Coordinator  Direct Dial: [phone]

## 2022-11-29 ENCOUNTER — Telehealth: Payer: 59 | Admitting: Student in an Organized Health Care Education/Training Program

## 2022-11-29 ENCOUNTER — Telehealth: Payer: Self-pay | Admitting: *Deleted

## 2022-11-29 ENCOUNTER — Other Ambulatory Visit: Payer: Self-pay

## 2022-11-29 NOTE — Patient Outreach (Signed)
  Care Management   Follow Up Note   11/29/2022 Name: Ryan [name] MRN: [id number] DOB: 23-Apr-1955   Referred by: Smitty Cords, DO Reason for referral : Care Management (RNCM: Initial outreach for Chronic Disease Management & Care Coordination Needs Attempt)   An unsuccessful telephone outreach was attempted today. The patient was referred to the case management team for assistance with care management and care coordination.   Follow Up Plan: The care management team will reach out to the patient again over the next 30-60 days.   Danise Edge, BSN RN RN Care Manager  [place]  Ambulatory Care Management  Direct Number: [phone]

## 2022-12-01 ENCOUNTER — Telehealth: Payer: 59 | Admitting: Student in an Organized Health Care Education/Training Program

## 2022-12-03 ENCOUNTER — Other Ambulatory Visit: Payer: Self-pay

## 2022-12-03 ENCOUNTER — Other Ambulatory Visit: Payer: Self-pay | Admitting: *Deleted

## 2022-12-03 ENCOUNTER — Other Ambulatory Visit: Payer: Self-pay | Admitting: Family Medicine

## 2022-12-03 DIAGNOSIS — M5416 Radiculopathy, lumbar region: Secondary | ICD-10-CM

## 2022-12-03 DIAGNOSIS — M5137 Other intervertebral disc degeneration, lumbosacral region with discogenic back pain only: Secondary | ICD-10-CM

## 2022-12-03 DIAGNOSIS — G8929 Other chronic pain: Secondary | ICD-10-CM

## 2022-12-03 MED ORDER — OXYCODONE-ACETAMINOPHEN 5-325 MG PO TABS
1.0000 | ORAL_TABLET | ORAL | 0 refills | Status: AC | PRN
Start: 2022-12-03 — End: 2022-12-08

## 2022-12-03 NOTE — Patient Instructions (Signed)
Visit Information  Thank you for taking time to visit with me today. Please don't hesitate to contact me if I can be of assistance to you before our next scheduled telephone appointment.  Following are the goals we discussed today:   Goals Addressed             This Visit's Progress    RNCM Care Management Expected Outcome: Monitor, Self-Manage and Reduce Symptoms of: HTN, COPD, Pain       Current Barriers:  Knowledge Deficits related to plan of care for management of HTN, COPD, and Pain  Care Coordination needs related to Lacks knowledge of community resource: for his niece that he is sole caretaker Chronic Disease Management support and education needs related to HTN, COPD, and Pain   RNCM Clinical Goal(s):  Patient will verbalize understanding of plan for management of HTN, COPD, and Pain as evidenced by verbal explanation, symptom recognition, adhering to all appointments and demonstrating knowledge about disease processes and knowing when to seek help  take all medications exactly as prescribed and will call provider for medication related questions as evidenced by compliance with all medications attend all scheduled medical appointments: with primary care provider and specialists as evidenced by keeping all scheduled appointments demonstrate Improved and Ongoing adherence to prescribed treatment plan for HTN, COPD, and Pain Management as evidenced by consistent medication compliance, symptom management monitoring, lifestyle modifications continue to work with RN Care Manager to address care management and care coordination needs related to  HTN, COPD, and Pain Management as evidenced by adherence to CM Team Scheduled appointments work with Child psychotherapist to address  related to the management of Lacks knowledge of community resource: for paralyzed niece that he is the sole carekater   Interventions: Evaluation of current treatment plan related to  self management and patient's adherence  to plan as established by provider   COPD Interventions:  (Status:  New goal. and Goal on track:  Yes.) Long Term Goal Provided patient with basic written and verbal COPD education on self care/management/and exacerbation prevention Advised patient to track and manage COPD triggers Provided written and verbal instructions on pursed lip breathing and utilized returned demonstration as teach back Provided instruction about proper use of medications used for management of COPD including inhalers Advised patient to self assesses COPD action plan zone and make appointment with provider if in the yellow zone for 48 hours without improvement Advised patient to engage in light exercise as tolerated 3-5 days a week to aid in the the management of COPD Provided education about and advised patient to utilize infection prevention strategies to reduce risk of respiratory infection Discussed the importance of adequate rest and management of fatigue with COPD Discussed Pulmonary Rehab and offered to assist with referral placement Screening for signs and symptoms of depression related to chronic disease state  Assessed social determinant of health barriers   Hypertension Interventions:  (Status:  New goal. and Goal on track:  Yes.) Long Term Goal Last practice recorded BP readings:  BP Readings from Last 3 Encounters:  11/19/22 118/72  11/02/22 (!) 137/98  10/13/22 (!) 159/106   Most recent eGFR/CrCl:  Lab Results  Component Value Date   EGFR 85 11/18/2021    No components found for: "CRCL"  Evaluation of current treatment plan related to hypertension self management and patient's adherence to plan as established by provider Provided education to patient re: stroke prevention, s/s of heart attack and stroke Reviewed medications with patient and discussed importance  of compliance Counseled on adverse effects of illicit drug and excessive alcohol use in patients with high blood pressure  Counseled on  the importance of exercise goals with target of 150 minutes per week Discussed plans with patient for ongoing care management follow up and provided patient with direct contact information for care management team Advised patient, providing education and rationale, to monitor blood pressure daily and record, calling PCP for findings outside established parameters Reviewed scheduled/upcoming provider appointments including:  Discussed complications of poorly controlled blood pressure such as heart disease, stroke, circulatory complications, vision complications, kidney impairment, sexual dysfunction Screening for signs and symptoms of depression related to chronic disease state   Pain Interventions:  (Status:  New goal. and Goal on track:  Yes.) Long Term Goal Pain assessment performed Medications reviewed Reviewed provider established plan for pain management Discussed importance of adherence to all scheduled medical appointments Counseled on the importance of reporting any/all new or changed pain symptoms or management strategies to pain management provider Advised patient to report to care team affect of pain on daily activities Discussed use of relaxation techniques and/or diversional activities to assist with pain reduction (distraction, imagery, relaxation, massage, acupressure, TENS, heat, and cold application Reviewed with patient prescribed pharmacological and nonpharmacological pain relief strategies  Patient Goals/Self-Care Activities: Take all medications as prescribed Attend all scheduled provider appointments Call pharmacy for medication refills 3-7 days in advance of running out of medications Attend church or other social activities Call provider office for new concerns or questions  Work with the social worker to address care coordination needs and will continue to work with the clinical team to address health care and disease management related needs call the Suicide and  Crisis Lifeline: 988 call the [place] National Suicide Prevention Lifeline: (305)086-9627 or TTY: 6618644316 TTY 732-800-3052) to talk to a trained counselor call 1-800-273-TALK (toll free, 24 hour hotline) call 911 if experiencing a Mental Health or Behavioral Health Crisis  keep follow-up appointments: 12-16-2022 with PCP write blood pressure results in a log or diary  Follow Up Plan:  Telephone follow up appointment with care management team member scheduled for:  01-14-2023 at 2:30 pm           Our next appointment is by telephone on 01-14-2023 at 2:30 pm  Please call the care guide team at [phone] if you need to cancel or reschedule your appointment.   If you are experiencing a Mental Health or Behavioral Health Crisis or need someone to talk to, please call the Suicide and Crisis Lifeline: 988 call the [place] National Suicide Prevention Lifeline: 918 134 8201 or TTY: (843)542-9081 TTY (405) 684-5667) to talk to a trained counselor call 1-800-273-TALK (toll free, 24 hour hotline) call 911   The patient verbalized understanding of instructions, educational materials, and care plan provided today and DECLINED offer to receive copy of patient instructions, educational materials, and care plan.   Telephone follow up appointment with care management team member scheduled for: 01-14-2023 at 2:30 pm  Danise Edge, BSN RN RN Care Manager  [facility]  Ambulatory Care Management  Direct Number: [phone]

## 2022-12-03 NOTE — Patient Outreach (Signed)
Care Management   Visit Note  12/03/2022 Name: Ryan [name] MRN: [id number] DOB: 1955/07/09  Subjective: Ryan [name] is a 67 y.o. year old male who is a primary care patient of Smitty Cords, DO. The Care Management team was consulted for assistance.      Engaged with patient spoke with patient by telephone.   Goals Addressed             This Visit's Progress    RNCM Care Management Expected Outcome: Monitor, Self-Manage and Reduce Symptoms of: HTN, COPD, Pain       Current Barriers:  Knowledge Deficits related to plan of care for management of HTN, COPD, and Pain  Care Coordination needs related to Lacks knowledge of community resource: for his niece that he is sole caretaker Chronic Disease Management support and education needs related to HTN, COPD, and Pain   RNCM Clinical Goal(s):  Patient will verbalize understanding of plan for management of HTN, COPD, and Pain as evidenced by verbal explanation, symptom recognition, adhering to all appointments and demonstrating knowledge about disease processes and knowing when to seek help  take all medications exactly as prescribed and will call provider for medication related questions as evidenced by compliance with all medications attend all scheduled medical appointments: with primary care provider and specialists as evidenced by keeping all scheduled appointments demonstrate Improved and Ongoing adherence to prescribed treatment plan for HTN, COPD, and Pain Management as evidenced by consistent medication compliance, symptom management monitoring, lifestyle modifications continue to work with RN Care Manager to address care management and care coordination needs related to  HTN, COPD, and Pain Management as evidenced by adherence to CM Team Scheduled appointments work with Child psychotherapist to address  related to the management of Lacks knowledge of community resource: for paralyzed niece that he is the sole carekater    Interventions: Evaluation of current treatment plan related to  self management and patient's adherence to plan as established by provider   COPD Interventions:  (Status:  New goal. and Goal on track:  Yes.) Long Term Goal Provided patient with basic written and verbal COPD education on self care/management/and exacerbation prevention Advised patient to track and manage COPD triggers Provided written and verbal instructions on pursed lip breathing and utilized returned demonstration as teach back Provided instruction about proper use of medications used for management of COPD including inhalers Advised patient to self assesses COPD action plan zone and make appointment with provider if in the yellow zone for 48 hours without improvement Advised patient to engage in light exercise as tolerated 3-5 days a week to aid in the the management of COPD Provided education about and advised patient to utilize infection prevention strategies to reduce risk of respiratory infection Discussed the importance of adequate rest and management of fatigue with COPD Discussed Pulmonary Rehab and offered to assist with referral placement Screening for signs and symptoms of depression related to chronic disease state  Assessed social determinant of health barriers   Hypertension Interventions:  (Status:  New goal. and Goal on track:  Yes.) Long Term Goal Last practice recorded BP readings:  BP Readings from Last 3 Encounters:  11/19/22 118/72  11/02/22 (!) 137/98  10/13/22 (!) 159/106   Most recent eGFR/CrCl:  Lab Results  Component Value Date   EGFR 85 11/18/2021    No components found for: "CRCL"  Evaluation of current treatment plan related to hypertension self management and patient's adherence to plan as established by provider Provided  education to patient re: stroke prevention, s/s of heart attack and stroke Reviewed medications with patient and discussed importance of compliance Counseled on  adverse effects of illicit drug and excessive alcohol use in patients with high blood pressure  Counseled on the importance of exercise goals with target of 150 minutes per week Discussed plans with patient for ongoing care management follow up and provided patient with direct contact information for care management team Advised patient, providing education and rationale, to monitor blood pressure daily and record, calling PCP for findings outside established parameters Reviewed scheduled/upcoming provider appointments including:  Discussed complications of poorly controlled blood pressure such as heart disease, stroke, circulatory complications, vision complications, kidney impairment, sexual dysfunction Screening for signs and symptoms of depression related to chronic disease state   Pain Interventions:  (Status:  New goal. and Goal on track:  Yes.) Long Term Goal Pain assessment performed Medications reviewed Reviewed provider established plan for pain management Discussed importance of adherence to all scheduled medical appointments Counseled on the importance of reporting any/all new or changed pain symptoms or management strategies to pain management provider Advised patient to report to care team affect of pain on daily activities Discussed use of relaxation techniques and/or diversional activities to assist with pain reduction (distraction, imagery, relaxation, massage, acupressure, TENS, heat, and cold application Reviewed with patient prescribed pharmacological and nonpharmacological pain relief strategies  Patient Goals/Self-Care Activities: Take all medications as prescribed Attend all scheduled provider appointments Call pharmacy for medication refills 3-7 days in advance of running out of medications Attend church or other social activities Call provider office for new concerns or questions  Work with the social worker to address care coordination needs and will continue to work  with the clinical team to address health care and disease management related needs call the Suicide and Crisis Lifeline: 988 call the [place] National Suicide Prevention Lifeline: 517-154-3691 or TTY: 859-096-9330 TTY 727-410-3051) to talk to a trained counselor call 1-800-273-TALK (toll free, 24 hour hotline) call 911 if experiencing a Mental Health or Behavioral Health Crisis  keep follow-up appointments: 12-16-2022 with PCP write blood pressure results in a log or diary  Follow Up Plan:  Telephone follow up appointment with care management team member scheduled for:  01-14-2023 at 2:30 pm              Consent to Services:  Patient was given information about care management services, agreed to services, and gave verbal consent to participate.   Plan: Telephone follow up appointment with care management team member scheduled for: 01-14-2023 at 2:30 pm  Danise Edge, BSN RN RN Care Manager  [facility]  Ambulatory Care Management  Direct Number: [phone]

## 2022-12-08 ENCOUNTER — Ambulatory Visit: Payer: Self-pay | Admitting: *Deleted

## 2022-12-08 DIAGNOSIS — M5442 Lumbago with sciatica, left side: Secondary | ICD-10-CM | POA: Diagnosis not present

## 2022-12-08 DIAGNOSIS — G8929 Other chronic pain: Secondary | ICD-10-CM | POA: Diagnosis not present

## 2022-12-08 DIAGNOSIS — M5441 Lumbago with sciatica, right side: Secondary | ICD-10-CM | POA: Diagnosis not present
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
## 2022-12-08 NOTE — Patient Instructions (Signed)
Visit Information  Thank you for taking time to visit with me today. Please don't hesitate to contact me if I can be of assistance to you.   Following are the goals we discussed today:   Goals Addressed             This Visit's Progress    Mental health -community resource support       Activities and task to complete in order to accomplish goals.   EMOTIONAL / MENTAL HEALTH SUPPORT Keep all upcoming appointment discussed today Self Support options  (continue to follow up with current mental health providers to assist with symptom management,  please provide paperwork to family members MD to request assessment for personal care assistance(re-sent 12/08/22), please continue to utilize self care strategies discussed today)         Our next appointment is by telephone on 12/22/22 at 10am  Please call the care guide team at [phone] if you need to cancel or reschedule your appointment.   If you are experiencing a Mental Health or Behavioral Health Crisis or need someone to talk to, please call the Suicide and Crisis Lifeline: 988   Patient verbalizes understanding of instructions and care plan provided today and agrees to view in MyChart. Active MyChart status and patient understanding of how to access instructions and care plan via MyChart confirmed with patient.     Telephone follow up appointment with care management team member scheduled for: 12/22/22  Verna Czech, LCSW [place]  [facility], [facility] Licensed Clinical Social Worker Care Coordinator  Direct Dial: [phone]

## 2022-12-10 ENCOUNTER — Encounter: Payer: Self-pay | Admitting: Student in an Organized Health Care Education/Training Program

## 2022-12-13 ENCOUNTER — Ambulatory Visit
Payer: 59 | Attending: Student in an Organized Health Care Education/Training Program | Admitting: Student in an Organized Health Care Education/Training Program

## 2022-12-13 DIAGNOSIS — M961 Postlaminectomy syndrome, not elsewhere classified: Secondary | ICD-10-CM

## 2022-12-13 DIAGNOSIS — M47816 Spondylosis without myelopathy or radiculopathy, lumbar region: Secondary | ICD-10-CM

## 2022-12-13 NOTE — Progress Notes (Signed)
Patient: Ryan [name]  Service Category: E/M  Provider: Edward Jolly, MD  DOB: October 16, 1955  DOS: 12/13/2022  Location: Office  MRN: [id number]  Setting: Ambulatory outpatient  Referring Provider: Saralyn Pilar *  Type: Established Patient  Specialty: Interventional Pain Management  PCP: Smitty Cords, DO  Location: Remote location  Delivery: TeleHealth     Virtual Encounter - Pain Management PROVIDER NOTE: Information contained herein reflects review and annotations entered in association with encounter. Interpretation of such information and data should be left to medically-trained personnel. Information provided to patient can be located elsewhere in the medical record under "Patient Instructions". Document created using STT-dictation technology, any transcriptional errors that may result from process are unintentional.    Contact & Pharmacy Preferred: [phone] Home: [phone] (home) Mobile: [phone] (mobile) E-mail: [email or web address]  [facility] [address]), [place] - [address]) [place] [address] Phone: [phone] Fax: [phone]   Pre-screening  Ryan [name] offered "in-person" vs "virtual" encounter. He indicated preferring virtual for this encounter.   Reason COVID-19*  Social distancing based on CDC and AMA recommendations.   I contacted YOHANDRY SIXTO on 12/13/2022 via telephone.      I clearly identified myself as Edward Jolly, MD. I verified that I was speaking with the correct person using two identifiers (Name: Ryan [name], and date of birth: 11-11-1955).  Consent I sought verbal advanced consent from Ryan [name] for virtual visit interactions. I informed Ryan [name] of possible security and privacy concerns, risks, and limitations associated with providing "not-in-person" medical evaluation and management services. I also informed Ryan [name] of the availability of "in-person"  appointments. Finally, I informed him that there would be a charge for the virtual visit and that he could be  personally, fully or partially, financially responsible for it. Ryan [name] expressed understanding and agreed to proceed.   Historic Elements   Ryan [name] is a 67 y.o. year old, male patient evaluated today after our last contact on 11/11/2022. Ryan [name]  has a past medical history of Anxiety, Arthritis, Asthma, Chronic kidney disease, COPD (chronic obstructive pulmonary disease) (HCC), Dyspnea, GERD (gastroesophageal reflux disease), Hyperlipidemia, Hypertension, Lower urinary tract symptoms (LUTS), Prostate disease, Stroke [facility]), Thyroid disease, Wears dentures, and Wears hearing aid. He also  has a past surgical history that includes Eye surgery; Hernia repair; Transforaminal lumbar interbody fusion (tlif) with pedicle screw fixation 1 level (Right, 06/18/2014); Myringotomy with tube placement (Bilateral, 03/28/2015); Back surgery; Esophagogastroduodenoscopy (egd) with propofol (N/A, 10/24/2015); Cataract extraction w/PHACO (Left, 01/06/2016); Tonsillectomy; Colonoscopy with propofol (N/A, 04/19/2016); and Sinusotomy (02/28/2019). Ryan [name] has a current medication list which includes the following prescription(s): albuterol, albuterol, alprazolam, atorvastatin, breztri aerosphere, gabapentin, levofloxacin, lisinopril, prednisone, sildenafil, tamsulosin, and tizanidine. He  reports that he has been smoking cigarettes. He has a 60 pack-year smoking history. He uses smokeless tobacco. He reports current alcohol use. He reports current drug use. Drugs: Marijuana and "Crack" cocaine. Ryan [name] has No Known Allergies.  BMI: Estimated body mass index is 25.69 kg/m as calculated from the following:   Height as of 11/19/22: 5\' 7"  (1.702 m).   Weight as of 11/19/22: 164 lb (74.4 kg). Last encounter: 08/26/2022. Last procedure: 10/13/2022.  HPI  Today, he is being contacted for a post-procedure  assessment.   Post-procedure evaluation   Type: Lumbar Facet, Medial Branch Block(s) #2,  2024 Primary Purpose: Therapeutic Region: Posterolateral Lumbosacral Spine Level: L2, L3, L4, L5,  Medial  Branch Level(s). Injecting these levels blocks the L3-4, L4-5 lumbar facet joints. Laterality: Bilateral     Local Anesthetic: Lidocaine 1-2%   Position: Prone    Indications: 1. Lumbar spondylosis   2. Lumbar facet arthropathy   3. Failed back surgical syndrome   4. Chronic pain syndrome        Effectiveness:  Initial hour after procedure:  (does not remember)  Subsequent 4-6 hours post-procedure:  (does not remember)  Analgesia past initial 6 hours: 65 % (lasted 2 weeks)  Ongoing improvement:  Analgesic:  back to baseline Function: Back to baseline ROM: Back to baseline   Laboratory Chemistry Profile   Renal Lab Results  Component Value Date   BUN 12 11/30/2021   CREATININE 0.75 11/30/2021   BCR SEE NOTE: 11/18/2021   GFRAA >60 09/11/2019   GFRNONAA >60 11/30/2021    Hepatic Lab Results  Component Value Date   AST 20 11/29/2021   ALT 14 11/29/2021   ALBUMIN 3.7 11/29/2021   ALKPHOS 119 11/29/2021   LIPASE 36 11/29/2021    Electrolytes Lab Results  Component Value Date   NA 137 11/30/2021   K 3.5 11/30/2021   CL 104 11/30/2021   CALCIUM 8.9 11/30/2021   MG 1.9 11/27/2021   PHOS 4.3 11/27/2021    Bone Lab Results  Component Value Date   TESTOSTERONE 473 02/02/2021    Inflammation (CRP: Acute Phase) (ESR: Chronic Phase) Lab Results  Component Value Date   LATICACIDVEN 2.7 (HH) 11/29/2021         Note: Above Lab results reviewed.   Assessment  The primary encounter diagnosis was Lumbar spondylosis. Diagnoses of Lumbar facet arthropathy and Failed back surgical syndrome were also pertinent to this visit.  Plan of Care  Repeat therapeutic/palliative lumbar facet medial branch nerve blocks. We have discussed spinal cord stimulation in the past for  chronic lumbar radicular pain.  Patient will continue to think about this and let us know if he would like to proceed.  Orders:  Orders Placed This Encounter  Procedures   LUMBAR FACET(MEDIAL BRANCH NERVE BLOCK) MBNB    Standing Status:   Future    Standing Expiration Date:   03/15/2023    Scheduling Instructions:     Type: Lumbar Facet, Medial Branch Block(s)     Primary Purpose: Therapeutic     Region: Posterolateral Lumbosacral Spine     Level: L2, L3, L4, L5,  Medial Branch Level(s). Injecting these levels blocks the L3-4, L4-5 lumbar facet joints.     Laterality: Bilateral    Order Specific Question:   Where will this procedure be performed?    Answer:   [facility]   Follow-up plan:   Return in about 3 weeks (around 01/03/2023) for B/L L2, 3, 4, 5 MBNB , ECT.      Status post bilateral L3, L4, L5, S1 lumbar facet medial branch nerve blocks on 03/20/2018 helped 85% pain relief for approximately 1 year. Status post left L3, L4, L5 RFA on 07/11/2019, Right L3, L4, L5 RFA 07/25/19. Not as helpful as lumbar facet MBBs- repeat PRN, consider SPRINT PNS                Recent Visits Date Type Provider Dept  10/13/22 Procedure visit Edward Jolly, MD [facility]  Showing recent visits within past 90 days and meeting all other requirements Today's Visits Date Type Provider Dept  12/13/22 Office Visit Edward Jolly, MD [facility]  Showing today's  visits and meeting all other requirements Future Appointments No visits were found meeting these conditions. Showing future appointments within next 90 days and meeting all other requirements  I discussed the assessment and treatment plan with the patient. The patient was provided an opportunity to ask questions and all were answered. The patient agreed with the plan and demonstrated an understanding of the instructions.  Patient advised to call back or seek an in-person evaluation if the symptoms or condition  worsens.  Duration of encounter: 15 minutes.  Note by: Edward Jolly, MD Date: 12/13/2022; Time: 3:43 PM

## 2022-12-15 ENCOUNTER — Other Ambulatory Visit: Payer: Self-pay | Admitting: Family Medicine

## 2022-12-15 DIAGNOSIS — E782 Mixed hyperlipidemia: Secondary | ICD-10-CM

## 2022-12-16 ENCOUNTER — Ambulatory Visit: Payer: 59 | Admitting: Family Medicine

## 2022-12-16 NOTE — Telephone Encounter (Signed)
Refilled 11/19/22 # 90 with 2 refills. Requested Prescriptions  Refused Prescriptions Disp Refills   atorvastatin (LIPITOR) 80 MG tablet [Pharmacy Med Name: Atorvastatin Calcium 80 MG Oral Tablet] 90 tablet 0    Sig: TAKE 1 TABLET BY MOUTH AT BEDTIME     Cardiovascular:  Antilipid - Statins Failed - 12/15/2022  2:00 PM      Failed - Lipid Panel in normal range within the last 12 months    Cholesterol  Date Value Ref Range Status  11/30/2021 84 0 - 200 mg/dL Final   LDL Cholesterol (Calc)  Date Value Ref Range Status  11/18/2021 36 mg/dL (calc) Final    Comment:    Reference range: <100 . Desirable range <100 mg/dL for primary prevention;   <70 mg/dL for patients with CHD or diabetic patients  with > or = 2 CHD risk factors. Marland Kitchen LDL-C is now calculated using the Martin-Hopkins  calculation, which is a validated novel method providing  better accuracy than the Friedewald equation in the  estimation of LDL-C.  Horald Pollen et al. Lenox Ahr. 1610;960(45): 2061-2068  (http://education.[employer].com/faq/FAQ164)    LDL Cholesterol  Date Value Ref Range Status  11/30/2021 39 0 - 99 mg/dL Final    Comment:           Total Cholesterol/HDL:CHD Risk Coronary Heart Disease Risk Table                     Men   Women  1/2 Average Risk   3.4   3.3  Average Risk       5.0   4.4  2 X Average Risk   9.6   7.1  3 X Average Risk  23.4   11.0        Use the calculated Patient Ratio above and the CHD Risk Table to determine the patient's CHD Risk.        ATP III CLASSIFICATION (LDL):  <100     mg/dL   Optimal  409-811  mg/dL   Near or Above                    Optimal  130-159  mg/dL   Borderline  914-782  mg/dL   High  >956     mg/dL   Very High Performed at [facility], [address] [place], [place] [address]    HDL  Date Value Ref Range Status  11/30/2021 40 (L) >40 mg/dL Final   Triglycerides  Date Value Ref Range Status  11/30/2021 26 <150 mg/dL Final          Passed - Patient is not pregnant      Passed - Valid encounter within last 12 months    Recent Outpatient Visits           3 weeks ago Degeneration of intervertebral disc of lumbosacral region with discogenic back pain   [place] [facility] Smitty Cords, DO   2 months ago Lumbar radiculopathy   [place] [facility] Smitty Cords, DO   10 months ago Cellulitis, umbilical   [place] [facility] Smitty Cords, DO   1 year ago Annual physical exam   [place] [facility] Smitty Cords, DO   1 year ago Ear pain, left   [facility] [place], [place]al[place] Con[place], [place]       Future Appointments  In 1 week Althea Charon, Netta Neat, DO [facility], [facility]

## 2022-12-22 ENCOUNTER — Encounter: Payer: 59 | Admitting: *Deleted

## 2022-12-22 DIAGNOSIS — G8929 Other chronic pain: Secondary | ICD-10-CM | POA: Diagnosis not present

## 2022-12-22 DIAGNOSIS — M5442 Lumbago with sciatica, left side: Secondary | ICD-10-CM | POA: Diagnosis not present

## 2022-12-22 DIAGNOSIS — M5441 Lumbago with sciatica, right side: Secondary | ICD-10-CM | POA: Diagnosis not present

## 2022-12-28 ENCOUNTER — Ambulatory Visit (INDEPENDENT_AMBULATORY_CARE_PROVIDER_SITE_OTHER): Payer: 59 | Admitting: Family Medicine

## 2022-12-28 ENCOUNTER — Encounter: Payer: Self-pay | Admitting: Family Medicine

## 2022-12-28 VITALS — BP 140/88 | Ht 67.0 in | Wt 160.0 lb

## 2022-12-28 DIAGNOSIS — M5416 Radiculopathy, lumbar region: Secondary | ICD-10-CM

## 2022-12-28 DIAGNOSIS — J432 Centrilobular emphysema: Secondary | ICD-10-CM

## 2022-12-28 DIAGNOSIS — M545 Low back pain, unspecified: Secondary | ICD-10-CM

## 2022-12-28 DIAGNOSIS — G8929 Other chronic pain: Secondary | ICD-10-CM | POA: Diagnosis not present

## 2022-12-28 DIAGNOSIS — M5137 Other intervertebral disc degeneration, lumbosacral region with discogenic back pain only: Secondary | ICD-10-CM | POA: Diagnosis not present

## 2022-12-28 MED ORDER — BREZTRI AEROSPHERE 160-9-4.8 MCG/ACT IN AERO
2.0000 | INHALATION_SPRAY | Freq: Two times a day (BID) | RESPIRATORY_TRACT | 11 refills | Status: AC
Start: 2022-12-28 — End: ?

## 2022-12-28 MED ORDER — ALBUTEROL SULFATE HFA 108 (90 BASE) MCG/ACT IN AERS
2.0000 | INHALATION_SPRAY | Freq: Four times a day (QID) | RESPIRATORY_TRACT | 5 refills | Status: AC | PRN
Start: 2022-12-28 — End: ?

## 2022-12-28 MED ORDER — OXYCODONE-ACETAMINOPHEN 7.5-325 MG PO TABS
1.0000 | ORAL_TABLET | ORAL | 0 refills | Status: DC | PRN
Start: 2022-12-28 — End: 2023-01-14

## 2022-12-28 NOTE — Patient Instructions (Addendum)
Thank you for coming to the office today.  Refilled Albuterol rescue inhaler  Refilled Breztri yellow inhaler 2 puff twice a day every day  Increase dose Oxycodone Percocet from 5 up to 7.5mg   Keep up with Dr Cherylann Ratel for spinal injection on 12/9 Monday  Social Work [employer] 12/4 call at 10am   Please schedule a Follow-up Appointment to: Return in about 3 weeks (around 01/18/2023) for 3-4 weeks Follow-up Back Pain / Med refills, prefer afternoon apt.  If you have any other questions or concerns, please feel free to call the office or send a message through MyChart. You may also schedule an earlier appointment if necessary.  Additionally, you may be receiving a survey about your experience at our office within a few days to 1 week by e-mail or mail. We value your feedback.  Saralyn Pilar, DO [facility], [place]
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
## 2022-12-29 ENCOUNTER — Ambulatory Visit: Payer: Self-pay | Admitting: *Deleted
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
## 2022-12-30 NOTE — Patient Instructions (Signed)
Visit Information  Thank you for taking time to visit with me today. Please don't hesitate to contact me if I can be of assistance to you.   Following are the goals we discussed today:   Goals Addressed             This Visit's Progress    Mental health -community resource support       Activities and task to complete in order to accomplish goals.   EMOTIONAL / MENTAL HEALTH SUPPORT Keep all upcoming appointment discussed today Self Support options  (continue to follow up with current mental health providers to assist with symptom management,  please provide paperwork to family members MD to request assessment for personal care assistanc once received, please continue to utilize self care strategies discussed today)        Our next appointment is by telephone on 01/13/23 at 11am  Please call the care guide team at [phone] if you need to cancel or reschedule your appointment.   If you are experiencing a Mental Health or Behavioral Health Crisis or need someone to talk to, please call the Suicide and Crisis Lifeline: [phone]   Patient verbalizes understanding of instructions and care plan provided today and agrees to view in MyChart. Active MyChart status and patient understanding of how to access instructions and care plan via MyChart confirmed with patient.     Telephone follow up appointment with care management team member scheduled for: 01/13/23  Verna Czech, LCSW [place]  [facility], [facility] Licensed Clinical Social Worker Care Coordinator  Direct Dial: [phone]

## 2023-01-03 ENCOUNTER — Ambulatory Visit
Payer: 59 | Attending: Student in an Organized Health Care Education/Training Program | Admitting: Student in an Organized Health Care Education/Training Program

## 2023-01-03 ENCOUNTER — Encounter: Payer: Self-pay | Admitting: Student in an Organized Health Care Education/Training Program

## 2023-01-03 ENCOUNTER — Ambulatory Visit
Admission: RE | Admit: 2023-01-03 | Discharge: 2023-01-03 | Disposition: A | Discharge status: Home / Self Care | Payer: 59 | Source: Ambulatory Visit | Attending: Student in an Organized Health Care Education/Training Program | Admitting: Student in an Organized Health Care Education/Training Program

## 2023-01-03 VITALS — BP 134/96 | HR 81 | Temp 98.0°F | Resp 16 | Ht 67.0 in | Wt 170.0 lb

## 2023-01-03 DIAGNOSIS — M47816 Spondylosis without myelopathy or radiculopathy, lumbar region: Secondary | ICD-10-CM | POA: Diagnosis not present

## 2023-01-03 DIAGNOSIS — G894 Chronic pain syndrome: Secondary | ICD-10-CM | POA: Insufficient documentation

## 2023-01-03 MED ORDER — DEXAMETHASONE SODIUM PHOSPHATE 10 MG/ML IJ SOLN
20.0000 mg | Freq: Once | INTRAMUSCULAR | Status: AC
  Administered 2023-01-03: 20 mg

## 2023-01-03 MED ORDER — ROPIVACAINE HCL 2 MG/ML IJ SOLN
INTRAMUSCULAR | Status: AC
  Filled 2023-01-03: qty 20

## 2023-01-03 MED ORDER — LIDOCAINE HCL 2 % IJ SOLN
20.0000 mL | Freq: Once | INTRAMUSCULAR | Status: AC
  Administered 2023-01-03: 400 mg

## 2023-01-03 MED ORDER — LIDOCAINE HCL 2 % IJ SOLN
INTRAMUSCULAR | Status: AC
  Filled 2023-01-03: qty 20

## 2023-01-03 MED ORDER — MIDAZOLAM HCL 5 MG/5ML IJ SOLN
INTRAMUSCULAR | Status: AC
  Filled 2023-01-03: qty 5

## 2023-01-03 MED ORDER — DEXAMETHASONE SODIUM PHOSPHATE 10 MG/ML IJ SOLN
INTRAMUSCULAR | Status: AC
  Filled 2023-01-03: qty 2

## 2023-01-03 MED ORDER — ROPIVACAINE HCL 2 MG/ML IJ SOLN
18.0000 mL | Freq: Once | INTRAMUSCULAR | Status: AC
  Administered 2023-01-03: 18 mL via PERINEURAL

## 2023-01-03 MED ORDER — FENTANYL CITRATE (PF) 100 MCG/2ML IJ SOLN
INTRAMUSCULAR | Status: AC
  Filled 2023-01-03: qty 2

## 2023-01-03 NOTE — Progress Notes (Signed)
Patient's Name: Ryan [name]  MRN: [id number]  Referring Provider: Saralyn Pilar *  DOB: [birth year]  PCP: Smitty Cords, DO  DOS: 01/03/2023  Note by: Edward Jolly, MD  Service setting: Ambulatory outpatient  Specialty: Interventional Pain Management  Patient type: Established  Location: [facility] (AMB) Pain Management Facility  Visit type: Interventional Procedure   Primary Reason for Visit: Interventional Pain Management Treatment. CC: Back Pain (lower)   Procedure:          Anesthesia, Analgesia, Anxiolysis:  Type: Lumbar Facet, Medial Branch Block(s)  Primary Purpose: Therapeutic Region: Posterolateral Lumbosacral Spine Level: L2, L3, L4, L5,  Medial Branch Level(s). Injecting these levels blocks the L3-4, L4-5 lumbar facet joints. Laterality: Bilateral   Local Anesthetic: Lidocaine 1-2%  Position: Prone   Indications: 1. Lumbar spondylosis   2. Lumbar facet arthropathy   3. Chronic pain syndrome    Pain Score: Pre-procedure: 8 /10 Post-procedure: 5 /10  Pre-op Assessment:  Mr. Latimer is a 67 y.o. (year old), male patient, seen today for interventional treatment. He  has a past surgical history that includes Eye surgery; Hernia repair; Transforaminal lumbar interbody fusion (tlif) with pedicle screw fixation 1 level (Right, 06/18/2014); Myringotomy with tube placement (Bilateral, 03/28/2015); Back surgery; Esophagogastroduodenoscopy (egd) with propofol (N/A, 10/24/2015); Cataract extraction w/PHACO (Left, 01/06/2016); Tonsillectomy; Colonoscopy with propofol (N/A, 04/19/2016); and Sinusotomy (02/28/2019). Mr. Salem has a current medication list which includes the following prescription(s): albuterol, albuterol, alprazolam, atorvastatin, breztri aerosphere, gabapentin, lisinopril, oxycodone-acetaminophen, sildenafil, tamsulosin, and tizanidine. His primarily concern today is the Back Pain (lower)   Initial Vital Signs:  Pulse/HCG Rate: 84  Temp: 98 F (36.7 C) Resp:  15 BP: (!) 140/93 SpO2: 100 %  BMI: Estimated body mass index is 26.63 kg/m as calculated from the following:   Height as of this encounter: 5\' 7"  (1.702 m).   Weight as of this encounter: 170 lb (77.1 kg).  Risk Assessment: Allergies: Reviewed. He is allergic to sertraline.  Allergy Precautions: None required Coagulopathies: Reviewed. None identified.  Blood-thinner therapy: None at this time Active Infection(s): Reviewed. None identified. Mr. Pelley is afebrile  Site Confirmation: Mr. Sestito was asked to confirm the procedure and laterality before marking the site Procedure checklist: Completed Consent: Before the procedure and under the influence of no sedative(s), amnesic(s), or anxiolytics, the patient was informed of the treatment options, risks and possible complications. To fulfill our ethical and legal obligations, as recommended by the American Medical Association's Code of Ethics, I have informed the patient of my clinical impression; the nature and purpose of the treatment or procedure; the risks, benefits, and possible complications of the intervention; the alternatives, including doing nothing; the risk(s) and benefit(s) of the alternative treatment(s) or procedure(s); and the risk(s) and benefit(s) of doing nothing. The patient was provided information about the general risks and possible complications associated with the procedure. These may include, but are not limited to: failure to achieve desired goals, infection, bleeding, organ or nerve damage, allergic reactions, paralysis, and death. In addition, the patient was informed of those risks and complications associated to Spine-related procedures, such as failure to decrease pain; infection (i.e.: Meningitis, epidural or intraspinal abscess); bleeding (i.e.: epidural hematoma, subarachnoid hemorrhage, or any other type of intraspinal or peri-dural bleeding); organ or nerve damage (i.e.: Any type of peripheral nerve, nerve root,  or spinal cord injury) with subsequent damage to sensory, motor, and/or autonomic systems, resulting in permanent pain, numbness, and/or weakness of one or several areas of the body;  allergic reactions; (i.e.: anaphylactic reaction); and/or death. Furthermore, the patient was informed of those risks and complications associated with the medications. These include, but are not limited to: allergic reactions (i.e.: anaphylactic or anaphylactoid reaction(s)); adrenal axis suppression; blood sugar elevation that in diabetics may result in ketoacidosis or comma; water retention that in patients with history of congestive heart failure may result in shortness of breath, pulmonary edema, and decompensation with resultant heart failure; weight gain; swelling or edema; medication-induced neural toxicity; particulate matter embolism and blood vessel occlusion with resultant organ, and/or nervous system infarction; and/or aseptic necrosis of one or more joints. Finally, the patient was informed that Medicine is not an exact science; therefore, there is also the possibility of unforeseen or unpredictable risks and/or possible complications that may result in a catastrophic outcome. The patient indicated having understood very clearly. We have given the patient no guarantees and we have made no promises. Enough time was given to the patient to ask questions, all of which were answered to the patient's satisfaction. Mr. Becherer has indicated that he wanted to continue with the procedure. Attestation: I, the ordering provider, attest that I have discussed with the patient the benefits, risks, side-effects, alternatives, likelihood of achieving goals, and potential problems during recovery for the procedure that I have provided informed consent. Date  Time: 01/03/2023  8:02 AM  Pre-Procedure Preparation:  Monitoring: As per clinic protocol. Respiration, ETCO2, SpO2, BP, heart rate and rhythm monitor placed and checked for  adequate function Safety Precautions: Patient was assessed for positional comfort and pressure points before starting the procedure. Time-out: I initiated and conducted the "Time-out" before starting the procedure, as per protocol. The patient was asked to participate by confirming the accuracy of the "Time Out" information. Verification of the correct person, site, and procedure were performed and confirmed by me, the nursing staff, and the patient. "Time-out" conducted as per Joint Commission's Universal Protocol (UP.01.01.01). Time: 0823  Description of Procedure:          Laterality: Bilateral. The procedure was performed in identical fashion on both sides. Levels:  L3, L4, L5,  Medial Branch Level(s) Area Prepped: Posterior Lumbosacral Region Prepping solution: ChloraPrep (2% chlorhexidine gluconate and 70% isopropyl alcohol) Safety Precautions: Aspiration looking for blood return was conducted prior to all injections. At no point did we inject any substances, as a needle was being advanced. Before injecting, the patient was told to immediately notify me if he was experiencing any new onset of "ringing in the ears, or metallic taste in the mouth". No attempts were made at seeking any paresthesias. Safe injection practices and needle disposal techniques used. Medications properly checked for expiration dates. SDV (single dose vial) medications used. After the completion of the procedure, all disposable equipment used was discarded in the proper designated medical waste containers. Local Anesthesia: Protocol guidelines were followed. The patient was positioned over the fluoroscopy table. The area was prepped in the usual manner. The time-out was completed. The target area was identified using fluoroscopy. A 12-in long, straight, sterile hemostat was used with fluoroscopic guidance to locate the targets for each level blocked. Once located, the skin was marked with an approved surgical skin marker. Once  all sites were marked, the skin (epidermis, dermis, and hypodermis), as well as deeper tissues (fat, connective tissue and muscle) were infiltrated with a Tritschler amount of a short-acting local anesthetic, loaded on a 10cc syringe with a 25G, 1.5-in  Needle. An appropriate amount of time was allowed for local  anesthetics to take effect before proceeding to the next step. Local Anesthetic: Lidocaine 2.0% The unused portion of the local anesthetic was discarded in the proper designated containers. Technical explanation of process:   L2 Medial Branch Nerve Block (MBB): The target area for the L3 medial branch is at the junction of the postero-lateral aspect of the superior articular process and the superior, posterior, and medial edge of the transverse process of L3. Under fluoroscopic guidance, a Quincke needle was inserted until contact was made with os over the superior postero-lateral aspect of the pedicular shadow (target area). After negative aspiration for blood,1 cc  mL of the nerve block solution was injected without difficulty or complication. The needle was removed intact. L3 Medial Branch Nerve Block (MBB): The target area for the L3 medial branch is at the junction of the postero-lateral aspect of the superior articular process and the superior, posterior, and medial edge of the transverse process of L4. Under fluoroscopic guidance, a Quincke needle was inserted until contact was made with os over the superior postero-lateral aspect of the pedicular shadow (target area). After negative aspiration for blood,1 cc  mL of the nerve block solution was injected without difficulty or complication. The needle was removed intact. L4 Medial Branch Nerve Block (MBB): The target area for the L4 medial branch is at the junction of the postero-lateral aspect of the superior articular process and the superior, posterior, and medial edge of the transverse process of L5. Under fluoroscopic guidance, a Quincke needle  was inserted until contact was made with os over the superior postero-lateral aspect of the pedicular shadow (target area). After negative aspiration for blood,1cc mL of the nerve block solution was injected without difficulty or complication. The needle was removed intact. L5 Medial Branch Nerve Block (MBB): The target area for the L5 medial branch is at the junction of the postero-lateral aspect of the superior articular process and the superior, posterior, and medial edge of the sacral ala. Under fluoroscopic guidance, a Quincke needle was inserted until contact was made with os over the superior postero-lateral aspect of the pedicular shadow (target area). After negative aspiration for blood,1cc  mL of the nerve block solution was injected without difficulty or complication. The needle was removed intact.   Nerve block solution: 10 cc solution made of 8 cc of 0.2% ropivacaine, 2 cc of Decadron 10 mg/cc.  1-1.5 cc injected at each level above bilaterally  Procedural Needles: 22-gauge, 3.5-inch, Quincke needles used for all levels.  Once the entire procedure was completed, the treated area was cleaned, making sure to leave some of the prepping solution back to take advantage of its long term bactericidal properties.   Illustration of the posterior view of the lumbar spine and the posterior neural structures. Laminae of L2 through S1 are labeled. DPRL5, dorsal primary ramus of L5; DPRS1, dorsal primary ramus of S1; DPR3, dorsal primary ramus of L3; FJ, facet (zygapophyseal) joint L3-L4; I, inferior articular process of L4; LB1, lateral branch of dorsal primary ramus of L1; IAB, inferior articular branches from L3 medial branch (supplies L4-L5 facet joint); IBP, intermediate branch plexus; MB3, medial branch of dorsal primary ramus of L3; NR3, third lumbar nerve root; S, superior articular process of L5; SAB, superior articular branches from L4 (supplies L4-5 facet joint also); TP3, transverse process of  L3.  Vitals:   01/03/23 0807 01/03/23 0820 01/03/23 0830 01/03/23 0836  BP: (!) 140/93 (!) 133/94 (!) 132/99 (!) 134/96  Pulse: 84 82 80 81  Resp:  15 14 16   Temp: 98 F (36.7 C)     SpO2: 100% 99% 98% 99%  Weight: 170 lb (77.1 kg)     Height: 5\' 7"  (1.702 m)        Start Time: 0823 hrs. End Time: 0834 hrs.  Imaging Guidance (Spinal):          Type of Imaging Technique: Fluoroscopy Guidance (Spinal) Indication(s): Assistance in needle guidance and placement for procedures requiring needle placement in or near specific anatomical locations not easily accessible without such assistance. Exposure Time: Please see nurses notes. Contrast: None used. Fluoroscopic Guidance: I was personally present during the use of fluoroscopy. "Tunnel Vision Technique" used to obtain the best possible view of the target area. Parallax error corrected before commencing the procedure. "Direction-depth-direction" technique used to introduce the needle under continuous pulsed fluoroscopy. Once target was reached, antero-posterior, oblique, and lateral fluoroscopic projection used confirm needle placement in all planes. Images permanently stored in EMR. Interpretation: No contrast injected. I personally interpreted the imaging intraoperatively. Adequate needle placement confirmed in multiple planes. Permanent images saved into the patient's record.   Post-operative Assessment:  Post-procedure Vital Signs:  Pulse/HCG Rate: 81  Temp: 98 F (36.7 C) Resp: 16 BP: (!) 134/96 SpO2: 99 %  EBL: None  Complications: No immediate post-treatment complications observed by team, or reported by patient.  Note: The patient tolerated the entire procedure well. A repeat set of vitals were taken after the procedure and the patient was kept under observation following institutional policy, for this type of procedure. Post-procedural neurological assessment was performed, showing return to baseline, prior to discharge. The  patient was provided with post-procedure discharge instructions, including a section on how to identify potential problems. Should any problems arise concerning this procedure, the patient was given instructions to immediately contact us, at any time, without hesitation. In any case, we plan to contact the patient by telephone for a follow-up status report regarding this interventional procedure.  Comments:  No additional relevant information.  Plan of Care   Consider spinal cord stimulator  Imaging Orders         DG PAIN CLINIC C-ARM 1-60 MIN NO REPORT     Medications ordered for procedure: Meds ordered this encounter  Medications   lidocaine (XYLOCAINE) 2 % (with pres) injection 400 mg   dexamethasone (DECADRON) injection 20 mg   ropivacaine (PF) 2 mg/mL (0.2%) (NAROPIN) injection 18 mL   Medications administered: We administered lidocaine, dexamethasone, and ropivacaine (PF) 2 mg/mL (0.2%).  See the medical record for exact dosing, route, and time of administration.  Disposition: Discharge home  Discharge Date & Time: 01/03/2023; 0837 hrs.   Physician-requested Follow-up: No follow-ups on file.  Future Appointments  Date Time Provider Department Center  01/13/2023 11:00 AM Verna Czech [place], [place] [facility] None  01/14/2023 12:00 PM Juanell Fairly, RN [facility] None  01/21/2023  2:40 PM Smitty Cords, DO [facility]  02/07/2023  2:20 PM Edward Jolly, MD [facility] None  07/01/2023  1:00 PM [facility]ANNUAL WELLNESS VISIT [facility]    Primary Care Physician: Smitty Cords, DO Location: [facility] Out[facility] Management [facility] Note by: Edward Jolly, MD Date: 01/03/2023; Time: 8:52 AM  Disclaimer:  Medicine is not an exact science. The only guarantee in medicine is that nothing is guaranteed. It is important to note that the decision to proceed with this intervention was based on the information collected from the patient. The Data and conclusions were drawn  from the  patient's questionnaire, the interview, and the physical examination. Because the information was provided in large part by the patient, it cannot be guaranteed that it has not been purposely or unconsciously manipulated. Every effort has been made to obtain as much relevant data as possible for this evaluation. It is important to note that the conclusions that lead to this procedure are derived in large part from the available data. Always take into account that the treatment will also be dependent on availability of resources and existing treatment guidelines, considered by other Pain Management Practitioners as being common knowledge and practice, at the time of the intervention. For Medico-Legal purposes, it is also important to point out that variation in procedural techniques and pharmacological choices are the acceptable norm. The indications, contraindications, technique, and results of the above procedure should only be interpreted and judged by a Board-Certified Interventional Pain Specialist with extensive familiarity and expertise in the same exact procedure and technique.

## 2023-01-03 NOTE — Patient Instructions (Signed)
Safety precautions to be maintained throughout the outpatient stay will include: orient to surroundings, keep bed in low position, maintain call bell within reach at all times, provide assistance with transfer out of bed and ambulation. Pain Management Discharge Instructions  General Discharge Instructions :  If you need to reach your doctor call: Monday-Friday 8:00 am - 4:00 pm at [phone] or toll free [phone].  After clinic hours [phone] to have operator reach doctor.  Bring all of your medication bottles to all your appointments in the pain clinic.  To cancel or reschedule your appointment with Pain Management please remember to call 24 hours in advance to avoid a fee.  Refer to the educational materials which you have been given on: General Risks, I had my Procedure. Discharge Instructions, Post Sedation.  Post Procedure Instructions:  The drugs you were given will stay in your system until tomorrow, so for the next 24 hours you should not drive, make any legal decisions or drink any alcoholic beverages.  You may eat anything you prefer, but it is better to start with liquids then soups and crackers, and gradually work up to solid foods.  Please notify your doctor immediately if you have any unusual bleeding, trouble breathing or pain that is not related to your normal pain.  Depending on the type of procedure that was done, some parts of your body may feel week and/or numb.  This usually clears up by tonight or the next day.  Walk with the use of an assistive device or accompanied by an adult for the 24 hours.  You may use ice on the affected area for the first 24 hours.  Put ice in a Ziploc bag and cover with a towel and place against area 15 minutes on 15 minutes off.  You may switch to heat after 24 hours.

## 2023-01-03 NOTE — Progress Notes (Signed)
Safety precautions to be maintained throughout the outpatient stay will include: orient to surroundings, keep bed in low position, maintain call bell within reach at all times, provide assistance with transfer out of bed and ambulation.  

## 2023-01-04 ENCOUNTER — Telehealth: Payer: Self-pay | Admitting: *Deleted

## 2023-01-04 NOTE — Telephone Encounter (Signed)
No problems post procedure. 

## 2023-01-12 DIAGNOSIS — M5442 Lumbago with sciatica, left side: Secondary | ICD-10-CM | POA: Diagnosis not present

## 2023-01-12 DIAGNOSIS — G8929 Other chronic pain: Secondary | ICD-10-CM | POA: Diagnosis not present

## 2023-01-12 DIAGNOSIS — M5441 Lumbago with sciatica, right side: Secondary | ICD-10-CM | POA: Diagnosis not present

## 2023-01-13 ENCOUNTER — Ambulatory Visit: Payer: Self-pay | Admitting: *Deleted

## 2023-01-13 ENCOUNTER — Ambulatory Visit: Payer: Self-pay

## 2023-01-13 ENCOUNTER — Encounter: Payer: Self-pay | Admitting: *Deleted
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
## 2023-01-13 NOTE — Telephone Encounter (Signed)
This encounter was created in error - please disregard.

## 2023-01-13 NOTE — Patient Instructions (Signed)
Visit Information  Thank you for taking time to visit with me today. Please don't hesitate to contact me if I can be of assistance to you.   Following are the goals we discussed today:   Goals Addressed             This Visit's Progress    Mental health -community resource support       Activities and task to complete in order to accomplish goals.   EMOTIONAL / MENTAL HEALTH SUPPORT Keep all upcoming appointment discussed today Self Support options  (continue to follow up with current mental health providers to assist with management of depression and anxiety,  please provide paperwork to family members MD to request assessment for personal care assistance, please continue to utilize self care strategies discussed today)         Our next appointment is by telephone on 02/09/23 at 11am  Please call the care guide team at [phone] if you need to cancel or reschedule your appointment.   If you are experiencing a Mental Health or Behavioral Health Crisis or need someone to talk to, please call the Suicide and Crisis Lifeline: 988   Patient verbalizes understanding of instructions and care plan provided today and agrees to view in MyChart. Active MyChart status and patient understanding of how to access instructions and care plan via MyChart confirmed with patient.     Telephone follow up appointment with care management team member scheduled for: 02/09/23  Verna Czech, LCSW [place]  [facility], [facility] Licensed Clinical Social Worker Care Coordinator  Direct Dial: [phone]

## 2023-01-13 NOTE — Telephone Encounter (Signed)
Summary: poss uti   Pt thinks he has a UTI.  There are nor appts until the 27th. [phone]     Chief Complaint: Urinary "discomfort." Symptoms: Above Frequency: Yesterday Pertinent Negatives: Patient denies fever Disposition: [] ED /[] Urgent Care (no appt availability in office) / [x] Appointment(In office/virtual)/ []  [place] [facility]/ [] Home Care/ [] Refused Recommended Disposition /[] [place] [facility]/ []  Follow-up with PCP Additional Notes: Agrees with appointment.  Reason for Disposition  Urinating more frequently than usual (i.e., frequency)  Answer Assessment - Initial Assessment Questions 1. SYMPTOM: "What's the main symptom you're concerned about?" (e.g., frequency, incontinence)     Discomfort 2. ONSET: "When did the    start?"     Yesterday 3. PAIN: "Is there any pain?" If Yes, ask: "How bad is it?" (Scale: 1-10; mild, moderate, severe)     Mild 4. CAUSE: "What do you think is causing the symptoms?"     UTI 5. OTHER SYMPTOMS: "Do you have any other symptoms?" (e.g., blood in urine, fever, flank pain, pain with urination)     No 6. PREGNANCY: "Is there any chance you are pregnant?" "When was your last menstrual period?"     N/a  Protocols used: Urinary Symptoms-A-AH

## 2023-01-14 ENCOUNTER — Other Ambulatory Visit: Payer: Self-pay

## 2023-01-14 ENCOUNTER — Other Ambulatory Visit (HOSPITAL_COMMUNITY)
Admission: RE | Admit: 2023-01-14 | Discharge: 2023-01-14 | Disposition: A | Discharge status: Home / Self Care | Payer: 59 | Source: Ambulatory Visit | Attending: Family Medicine | Admitting: Family Medicine

## 2023-01-14 ENCOUNTER — Ambulatory Visit (INDEPENDENT_AMBULATORY_CARE_PROVIDER_SITE_OTHER): Payer: 59 | Admitting: Family Medicine

## 2023-01-14 ENCOUNTER — Encounter: Payer: Self-pay | Admitting: Family Medicine

## 2023-01-14 VITALS — BP 132/84 | HR 81 | Ht 67.0 in | Wt 162.0 lb

## 2023-01-14 DIAGNOSIS — M5416 Radiculopathy, lumbar region: Secondary | ICD-10-CM

## 2023-01-14 DIAGNOSIS — M545 Low back pain, unspecified: Secondary | ICD-10-CM | POA: Diagnosis not present

## 2023-01-14 DIAGNOSIS — N3000 Acute cystitis without hematuria: Secondary | ICD-10-CM | POA: Diagnosis not present

## 2023-01-14 DIAGNOSIS — G8929 Other chronic pain: Secondary | ICD-10-CM

## 2023-01-14 DIAGNOSIS — R300 Dysuria: Secondary | ICD-10-CM

## 2023-01-14 DIAGNOSIS — M5137 Other intervertebral disc degeneration, lumbosacral region with discogenic back pain only: Secondary | ICD-10-CM | POA: Diagnosis not present

## 2023-01-14 DIAGNOSIS — Z202 Contact with and (suspected) exposure to infections with a predominantly sexual mode of transmission: Secondary | ICD-10-CM | POA: Diagnosis present

## 2023-01-14 LAB — POCT URINALYSIS DIPSTICK
Bilirubin, UA: NEGATIVE
Blood, UA: NEGATIVE
Glucose, UA: NEGATIVE
Ketones, UA: NEGATIVE
Leukocytes, UA: NEGATIVE
Nitrite, UA: NEGATIVE
Protein, UA: NEGATIVE
Spec Grav, UA: 1.015 (ref 1.010–1.025)
Urobilinogen, UA: 0.2 U/dL
pH, UA: 5 (ref 5.0–8.0)

## 2023-01-14 MED ORDER — CEPHALEXIN 500 MG PO CAPS: 500.0000 mg | ORAL_CAPSULE | Freq: Three times a day (TID) | ORAL | 0 refills | Status: DC

## 2023-01-14 MED ORDER — OXYCODONE-ACETAMINOPHEN 7.5-325 MG PO TABS: 1.0000 | ORAL_TABLET | ORAL | 0 refills | Status: DC | PRN

## 2023-01-14 MED ORDER — DOXYCYCLINE HYCLATE 100 MG PO TABS: 100.0000 mg | ORAL_TABLET | Freq: Two times a day (BID) | ORAL | 0 refills | Status: DC

## 2023-01-14 NOTE — Patient Instructions (Addendum)
Thank you for coming to the office today.  Possible urinary tract infection - Start Keflex capsules 500mg  3 times daily for next 7 days, complete entire course, even if feeling better - We sent urine for a culture, we will call you within next few days if we need to change antibiotics - Please drink plenty of fluids, improve hydration over next 1 week  We are also testing for Chlamydia and Gonorrhea. If POSITIVE, we will call you next week. Then you would need to start the OTHER antibiotic Doxycycline.  Start taking Doxycycline antibiotic 100mg  twice daily for 7 days. Take with full glass of water and stay upright for at least 30 min after taking, may be seated or standing, but should NOT lay down. This is just a safety precaution, if this medicine does not go all the way down throat well it could cause some burning discomfort to throat and esophagus. Also NOTE - do not take medicine within 2 hours (before or after) consuming dairy or foods / vitamins containing high calcium or iron.  -----------------------------  Refilled Oxycodone  and we cancelled apt on Friday 12/27   If symptoms worsening, developing nausea / vomiting, worsening back pain, fevers / chills / sweats, then please return for re-evaluation sooner.   Recommend continue on the Cranberry.   Please schedule a Follow-up Appointment to: No follow-ups on file.  If you have any other questions or concerns, please feel free to call the office or send a message through MyChart. You may also schedule an earlier appointment if necessary.  Additionally, you may be receiving a survey about your experience at our office within a few days to 1 week by e-mail or mail. We value your feedback.  Saralyn Pilar, DO [facility], [place]

## 2023-01-14 NOTE — Progress Notes (Signed)
Subjective:    Patient ID: Ryan [name], male    DOB: February 11, 1955, 67 y.o.   MRN: [id number]  Ryan [name] is a 67 y.o. male presenting on 01/14/2023 for Urinary Tract Infection (Possible 3-4 days )   HPI  Discussed the use of AI scribe software for clinical note transcription with the patient, who gave verbal consent to proceed.  History of Present Illness    UTI Cystitis, suspected  The patient presents with a chief complaint of urinary symptoms, which he describes as similar to previous urinary tract infections (UTIs). Urinary frequency and some dysuria. He reports a sensation of 'tingling' in the urinary tract, but denies any associated pain, discharge, or hematuria. The patient has been self-managing with cranberry juice, which he reports has led to some improvement in symptoms.  In addition to the urinary symptoms, the patient also reports ongoing issues with back pain. However, he has not exhausted his current supply of pain medication and does not request additional treatment at this time.  The patient also discloses recent sexual activity with a new partner, raising concerns about potential sexually transmitted infections (STIs). He requests testing for gonorrhea and chlamydia.  Followed by Ortho ./ Pain Management. Awaiting further procedure from Pain Management. I have been re ordering his pain medication. He is due for refill soon.         11/10/2022   10:16 AM 06/25/2022    1:08 PM 04/21/2022    1:46 PM  Depression screen PHQ 2/9  Decreased Interest 1 1 0  Down, Depressed, Hopeless 2 1 0  PHQ - 2 Score 3 2 0  Altered sleeping 1 1   Tired, decreased energy 1 1   Change in appetite 0    Feeling bad or failure about yourself  0    Trouble concentrating 2    PHQ-9 Score 7 4   Difficult doing work/chores  Somewhat difficult        11/18/2021    2:18 PM 10/23/2019   11:03 AM  GAD 7 : Generalized Anxiety Score  Nervous, Anxious, on Edge 0 0  Control/stop  worrying 1 1  Worry too much - different things 0 0  Trouble relaxing 0 0  Restless 0 0  Easily annoyed or irritable 1 0  Afraid - awful might happen 1 1  Total GAD 7 Score 3 2  Anxiety Difficulty Somewhat difficult Not difficult at all    Social History   Tobacco Use   Smoking status: Every Day    Current packs/day: 1.50    Average packs/day: 1.5 packs/day for 40.0 years (60.0 ttl pk-yrs)    Types: Cigarettes   Smokeless tobacco: Current   Tobacco comments:    has cut back to 1PPD  Vaping Use   Vaping status: Never Used  Substance Use Topics   Alcohol use: Yes    Alcohol/week: 0.0 standard drinks of alcohol    Comment: ONCE OR TWICE A YR   Drug use: Yes    Types: Marijuana, "Crack" cocaine    Review of Systems Per HPI unless specifically indicated above     Objective:    BP 132/84   Pulse 81   Ht 5\' 7"  (1.702 m)   Wt 162 lb (73.5 kg)   SpO2 97%   BMI 25.37 kg/m   Wt Readings from Last 3 Encounters:  01/14/23 162 lb (73.5 kg)  01/03/23 170 lb (77.1 kg)  12/28/22 160 lb (72.6 kg)  Physical Exam Vitals and nursing note reviewed.  Constitutional:      General: He is not in acute distress.    Appearance: Normal appearance. He is well-developed. He is not diaphoretic.     Comments: Well-appearing, comfortable, cooperative  HENT:     Head: Normocephalic and atraumatic.  Eyes:     General:        Right eye: No discharge.        Left eye: No discharge.     Conjunctiva/sclera: Conjunctivae normal.  Cardiovascular:     Rate and Rhythm: Normal rate.  Pulmonary:     Effort: Pulmonary effort is normal.  Skin:    General: Skin is warm and dry.     Findings: No erythema or rash.  Neurological:     Mental Status: He is alert and oriented to person, place, and time.  Psychiatric:        Mood and Affect: Mood normal.        Behavior: Behavior normal.        Thought Content: Thought content normal.     Comments: Well groomed, good eye contact, normal speech  and thoughts     Results for orders placed or performed in visit on 01/14/23  POCT Urinalysis Dipstick   Collection Time: 01/14/23 11:27 AM  Result Value Ref Range   Color, UA yellow    Clarity, UA clear    Glucose, UA Negative Negative   Bilirubin, UA negative    Ketones, UA negative    Spec Grav, UA 1.015 1.010 - 1.025   Blood, UA negative    pH, UA 5.0 5.0 - 8.0   Protein, UA Negative Negative   Urobilinogen, UA 0.2 0.2 or 1.0 E.U./dL   Nitrite, UA negative    Leukocytes, UA Negative Negative   Appearance     Odor smells       Assessment & Plan:   Problem List Items Addressed This Visit     Chronic low back pain   Relevant Medications   oxyCODONE-acetaminophen (PERCOCET) 7.5-325 MG tablet   DDD (degenerative disc disease), lumbosacral   Relevant Medications   oxyCODONE-acetaminophen (PERCOCET) 7.5-325 MG tablet   Lumbar radiculopathy   Relevant Medications   oxyCODONE-acetaminophen (PERCOCET) 7.5-325 MG tablet   Other Visit Diagnoses       Acute cystitis without hematuria    -  Primary   Relevant Medications   cephALEXin (KEFLEX) 500 MG capsule   Other Relevant Orders   Urine Culture     Dysuria       Relevant Orders   POCT Urinalysis Dipstick (Completed)     Possible exposure to STD       Relevant Medications   doxycycline (VIBRA-TABS) 100 MG tablet   Other Relevant Orders   GC/Chlamydia probe amp ([place])not at [facility]         Urinary Symptoms Patient reports a tingling sensation and cloudy, odorous urine, similar to previous urinary tract infections (UTIs). No dysuria or discharge. Symptoms improved already with cranberry juice. Urinalysis in office did not show obvious signs of infection. -Send urine for culture and sensitivity. - Given classic symptoms with prior UTI and upcoming Christmas Holiday I agreed to prescribe initial empiric course. -Start Keflex 500mg  TID for 7 days.  Additionally given recent new sexual contact, unknown exposure  but possible STD exposure - We will proceed w/ urine testing for gonorrhea and chlamydia. - Questionable symptoms for potential STD, but given the uncertainty we will order Doxycycline  course, he may fill it and proceed if we contact him next week with results.  Chronic Back Pain Lumbar facet arthropathy / Sciatica Followed by Ortho/Pain Management Patient reports ongoing back pain, currently managed with pain medication. -Refill pain medication prescription Oxycodone-Acetaminophen (30 pills) to be picked up at [facility].  Follow-up Cancelled appointment on 01/21/2023 as patient's needs were addressed during this visit.         Orders Placed This Encounter  Procedures   Urine Culture   POCT Urinalysis Dipstick    Meds ordered this encounter  Medications   cephALEXin (KEFLEX) 500 MG capsule    Sig: Take 1 capsule (500 mg total) by mouth 3 (three) times daily. For 7 days, for urinary tract infection    Dispense:  21 capsule    Refill:  0   doxycycline (VIBRA-TABS) 100 MG tablet    Sig: Take 1 tablet (100 mg total) by mouth 2 (two) times daily. For 7 days. Take with full glass of water, stay upright 30 min after taking.    Dispense:  14 tablet    Refill:  0   oxyCODONE-acetaminophen (PERCOCET) 7.5-325 MG tablet    Sig: Take 1 tablet by mouth every 4 (four) hours as needed for severe pain (pain score 7-10).    Dispense:  30 tablet    Refill:  0    Follow up plan: Return if symptoms worsen or fail to improve.    Saralyn Pilar, DO [facility] 01/14/2023, 11:30 AM

## 2023-01-15 LAB — URINE CULTURE
MICRO NUMBER:: 15877494
Result:: NO GROWTH
SPECIMEN QUALITY:: ADEQUATE

## 2023-01-18 LAB — GC/CHLAMYDIA PROBE AMP (~~LOC~~) NOT AT ARMC
Chlamydia: NEGATIVE
Comment: NEGATIVE
Comment: NORMAL
Neisseria Gonorrhea: NEGATIVE

## 2023-01-20 NOTE — Progress Notes (Signed)
Patient notified of test results.  Verbal understanding

## 2023-01-20 NOTE — Progress Notes (Signed)
Patient notified of urine culture results.   Verbal understanding

## 2023-01-21 ENCOUNTER — Ambulatory Visit: Payer: 59 | Admitting: Family Medicine

## 2023-01-24 NOTE — Patient Outreach (Signed)
  Care Management   Visit Note   Name: Ryan [name] MRN: [id number] DOB: 1955/10/07  Subjective: Ryan [name] is a 67 y.o. year old male who is a primary care patient of Smitty Cords, DO. The Care Management team was consulted for assistance.      Contacted Ryan [name] for scheduled outreach. Reports being busy having car repaired at time of call. Agreed to call if he is available later today. Otherwise, will call to reschedule outreach.   [facility]  [facility], [facility] RN Care Manager Direct Dial: [phone] Website: [email or web address]

## 2023-01-27 DIAGNOSIS — G8929 Other chronic pain: Secondary | ICD-10-CM | POA: Diagnosis not present

## 2023-01-27 DIAGNOSIS — M5442 Lumbago with sciatica, left side: Secondary | ICD-10-CM | POA: Diagnosis not present

## 2023-01-27 DIAGNOSIS — M5441 Lumbago with sciatica, right side: Secondary | ICD-10-CM | POA: Diagnosis not present

## 2023-02-07 ENCOUNTER — Encounter: Payer: Self-pay | Admitting: Student in an Organized Health Care Education/Training Program

## 2023-02-07 ENCOUNTER — Ambulatory Visit
Payer: 59 | Attending: Student in an Organized Health Care Education/Training Program | Admitting: Student in an Organized Health Care Education/Training Program

## 2023-02-07 VITALS — BP 159/85 | HR 74 | Temp 97.0°F | Resp 18 | Ht 67.0 in | Wt 165.0 lb

## 2023-02-07 DIAGNOSIS — M47816 Spondylosis without myelopathy or radiculopathy, lumbar region: Secondary | ICD-10-CM | POA: Diagnosis not present

## 2023-02-07 DIAGNOSIS — G894 Chronic pain syndrome: Secondary | ICD-10-CM | POA: Diagnosis not present

## 2023-02-07 NOTE — Progress Notes (Signed)
 Safety precautions to be maintained throughout the outpatient stay will include: orient to surroundings, keep bed in low position, maintain call bell within reach at all times, provide assistance with transfer out of bed and ambulation.

## 2023-02-07 NOTE — Patient Instructions (Signed)

## 2023-02-07 NOTE — Progress Notes (Signed)
 PROVIDER NOTE: Information contained herein reflects review and annotations entered in association with encounter. Interpretation of such information and data should be left to medically-trained personnel. Information provided to patient can be located elsewhere in the medical record under Patient Instructions. Document created using STT-dictation technology, any transcriptional errors that may result from process are unintentional.    Patient: Ryan [name]  Service Category: E/M  Provider: Wallie Sherry, MD  DOB: May 24, 1955  DOS: 02/07/2023  Referring Provider: Edman Blunt *  MRN: [id number]  Specialty: Interventional Pain Management  PCP: Edman Blunt PARAS, DO  Type: Established Patient  Setting: Ambulatory outpatient    Location: Office  Delivery: Face-to-face     HPI  Mr. KILEY TORRENCE, a 68 y.o. year old male, is here today because of his Lumbar spondylosis [M47.816]. Mr. Belote primary complain today is Back Pain  Pertinent problems: Mr. Boyadjian has DDD (degenerative disc disease), lumbosacral; Spinal stenosis, lumbar region, with neurogenic claudication; Lumbar radiculopathy; Lumbar spondylosis; Failed back surgical syndrome; and History of lumbar fusion on their pertinent problem list. Pain Assessment: Severity of Chronic pain is reported as a 7 /10. Location: Back Lower/radiates into both legs. Onset: More than a month ago. Quality: Aching, Burning, Constant, Stabbing. Timing: Constant. Modifying factor(s): injections, meds. Vitals:  height is 5' 7 (1.702 m) and weight is 165 lb (74.8 kg). His temperature is 97 F (36.1 C) (abnormal). His blood pressure is 159/85 (abnormal) and his pulse is 74. His respiration is 18 and oxygen saturation is 100%.  BMI: Estimated body mass index is 25.84 kg/m as calculated from the following:   Height as of this encounter: 5' 7 (1.702 m).   Weight as of this encounter: 165 lb (74.8 kg). Last encounter: 12/13/2022. Last procedure:  01/03/2023.  Reason for encounter: post-procedure evaluation and assessment.    Post-procedure evaluation    Type: Lumbar Facet, Medial Branch Block(s)  Primary Purpose: Therapeutic Region: Posterolateral Lumbosacral Spine Level: L2, L3, L4, L5,  Medial Branch Level(s). Injecting these levels blocks the L3-4, L4-5 lumbar facet joints. Laterality: Bilateral  Purpose: Diagnostic/Therapeutic Indications: Lumbar radicular pain of intraspinal etiology of more than 4 weeks that has failed to respond to conservative therapy and is severe enough to impact quality of life or function. 1. Chronic pain syndrome   2. Spinal stenosis, lumbar region, with neurogenic claudication   3. Lumbar radicular pain    NAS-11 Pain score:   Pre-procedure: 5 /10   Post-procedure: 0-No pain/10     Effectiveness:  Initial hour after procedure: 100 %  Subsequent 4-6 hours post-procedure: 100 %  Analgesia past initial 6 hours: 75 %  Ongoing improvement:  Analgesic:  50% for 1 month Function: Back to baseline ROM: Back to baseline   ROS  Constitutional: Denies any fever or chills Gastrointestinal: No reported hemesis, hematochezia, vomiting, or acute GI distress Musculoskeletal:  +LBP Neurological: No reported episodes of acute onset apraxia, aphasia, dysarthria, agnosia, amnesia, paralysis, loss of coordination, or loss of consciousness  Medication Review  ALPRAZolam , Budeson-Glycopyrrol-Formoterol, albuterol , atorvastatin , cephALEXin , doxycycline , gabapentin , lisinopril , oxyCODONE -acetaminophen , sildenafil , tamsulosin , and tiZANidine   History Review  Allergy: Mr. Feagans is allergic to sertraline. Drug: Mr. Riera  reports current drug use. Drugs: Marijuana and Crack cocaine. Alcohol:  reports current alcohol use. Tobacco:  reports that he has been smoking cigarettes. He has a 60 pack-year smoking history. He uses smokeless tobacco. Social: Mr. Kloster  reports that he has been smoking cigarettes. He  has a 60 pack-year smoking history. He  uses smokeless tobacco. He reports current alcohol use. He reports current drug use. Drugs: Marijuana and Crack cocaine. Medical:  has a past medical history of Anxiety, Arthritis, Asthma, Chronic kidney disease, COPD (chronic obstructive pulmonary disease) (HCC), Dyspnea, GERD (gastroesophageal reflux disease), Hyperlipidemia, Hypertension, Lower urinary tract symptoms (LUTS), Prostate disease, Stroke (HCC), Thyroid  disease, Wears dentures, and Wears hearing aid. Surgical: Mr. Harpster  has a past surgical history that includes Eye surgery; Hernia repair; Transforaminal lumbar interbody fusion (tlif) with pedicle screw fixation 1 level (Right, 06/18/2014); Myringotomy with tube placement (Bilateral, 03/28/2015); Back surgery; Esophagogastroduodenoscopy (egd) with propofol  (N/A, 10/24/2015); Cataract extraction w/PHACO (Left, 01/06/2016); Tonsillectomy; Colonoscopy with propofol  (N/A, 04/19/2016); and Sinusotomy (02/28/2019). Family: family history includes Arthritis in his mother; Diabetes in his father; Hypertension in his father and mother.  Laboratory Chemistry Profile   Renal Lab Results  Component Value Date   BUN 12 11/30/2021   CREATININE 0.75 11/30/2021   BCR SEE NOTE: 11/18/2021   GFRAA >60 09/11/2019   GFRNONAA >60 11/30/2021    Hepatic Lab Results  Component Value Date   AST 20 11/29/2021   ALT 14 11/29/2021   ALBUMIN 3.7 11/29/2021   ALKPHOS 119 11/29/2021   LIPASE 36 11/29/2021    Electrolytes Lab Results  Component Value Date   NA 137 11/30/2021   K 3.5 11/30/2021   CL 104 11/30/2021   CALCIUM  8.9 11/30/2021   MG 1.9 11/27/2021   PHOS 4.3 11/27/2021    Bone Lab Results  Component Value Date   TESTOSTERONE  473 02/02/2021    Inflammation (CRP: Acute Phase) (ESR: Chronic Phase) Lab Results  Component Value Date   LATICACIDVEN 2.7 (HH) 11/29/2021         Note: Above Lab results reviewed.  Recent Imaging Review  DG PAIN CLINIC  C-ARM 1-60 MIN NO REPORT Fluoro was used, but no Radiologist interpretation will be provided.  Please refer to NOTES tab for provider progress note. Note: Reviewed        Physical Exam  General appearance: Well nourished, well developed, and well hydrated. In no apparent acute distress Mental status: Alert, oriented x 3 (person, place, & time)       Respiratory: No evidence of acute respiratory distress Eyes: PERLA Vitals: BP (!) 159/85   Pulse 74   Temp (!) 97 F (36.1 C)   Resp 18   Ht 5' 7 (1.702 m)   Wt 165 lb (74.8 kg)   SpO2 100%   BMI 25.84 kg/m  BMI: Estimated body mass index is 25.84 kg/m as calculated from the following:   Height as of this encounter: 5' 7 (1.702 m).   Weight as of this encounter: 165 lb (74.8 kg). Ideal: Ideal body weight: 66.1 kg (145 lb 11.6 oz) Adjusted ideal body weight: 69.6 kg (153 lb 7 oz)  Low back pain, worse with lumbar extension and facet loading  Assessment   Diagnosis Status  1. Lumbar spondylosis   2. Lumbar facet arthropathy   3. Chronic pain syndrome    Having a Flare-up Having a Flare-up Controlled   Updated Problems: No problems updated.  Plan of Care    Orders:  Orders Placed This Encounter  Procedures   LUMBAR FACET(MEDIAL BRANCH NERVE BLOCK) MBNB    Standing Status:   Future    Expiration Date:   05/08/2023    Scheduling Instructions:     Procedure: Lumbar facet block (AKA.: Lumbosacral medial branch nerve block)     Side: Bilateral  Level: L2-3, L3-4, L4-5,Facets ( L2, L3, L4, L5, Medial Branch)     Sedation: Patient's choice.     Timeframe: ASAA    Where will this procedure be performed?:   [facility]   Follow-up plan:   Return in about 2 weeks (around 02/21/2023) for B/L L3,4,5 MBNB .      Status post bilateral L3, L4, L5, S1 lumbar facet medial branch nerve blocks on 03/20/2018 helped 85% pain relief for approximately 1 year. Status post left L3, L4, L5 RFA on 07/11/2019, Right L3, L4, L5  RFA 07/25/19. Not as helpful as lumbar facet MBBs- repeat PRN, consider SPRINT PNS                 Recent Visits Date Type Provider Dept  01/03/23 Procedure visit Marcelino Nurse, MD [facility]  12/13/22 Office Visit Marcelino Nurse, MD [facility]  Showing recent visits within past 90 days and meeting all other requirements Today's Visits Date Type Provider Dept  02/07/23 Office Visit Marcelino Nurse, MD [facility]  Showing today's visits and meeting all other requirements Future Appointments Date Type Provider Dept  02/21/23 Appointment Marcelino Nurse, MD [facility]  Showing future appointments within next 90 days and meeting all other requirements  I discussed the assessment and treatment plan with the patient. The patient was provided an opportunity to ask questions and all were answered. The patient agreed with the plan and demonstrated an understanding of the instructions.  Patient advised to call back or seek an in-person evaluation if the symptoms or condition worsens.  Duration of encounter: .  Total time on encounter, as per AMA guidelines included both the face-to-face and non-face-to-face time personally spent by the physician and/or other qualified health care professional(s) on the day of the encounter (includes time in activities that require the physician or other qualified health care professional and does not include time in activities normally performed by clinical staff). Physician's time may include the following activities when performed: Preparing to see the patient (e.g., pre-charting review of records, searching for previously ordered imaging, lab work, and nerve conduction tests) Review of prior analgesic pharmacotherapies. Reviewing PMP Interpreting ordered tests (e.g., lab work, imaging, nerve conduction tests) Performing post-procedure evaluations, including interpretation of diagnostic procedures Obtaining and/or  reviewing separately obtained history Performing a medically appropriate examination and/or evaluation Counseling and educating the patient/family/caregiver Ordering medications, tests, or procedures Referring and communicating with other health care professionals (when not separately reported) Documenting clinical information in the electronic or other health record Independently interpreting results (not separately reported) and communicating results to the patient/ family/caregiver Care coordination (not separately reported)  Note by: Nurse Marcelino, MD Date: 02/07/2023; Time: 2:53 PM

## 2023-02-09 ENCOUNTER — Ambulatory Visit: Payer: Self-pay | Admitting: *Deleted

## 2023-02-09 DIAGNOSIS — M5442 Lumbago with sciatica, left side: Secondary | ICD-10-CM | POA: Diagnosis not present

## 2023-02-09 DIAGNOSIS — M5441 Lumbago with sciatica, right side: Secondary | ICD-10-CM | POA: Diagnosis not present

## 2023-02-09 DIAGNOSIS — G8929 Other chronic pain: Secondary | ICD-10-CM | POA: Diagnosis not present

## 2023-02-09 NOTE — Patient Instructions (Signed)
 Visit Information  Thank you for taking time to visit with me today. Please don't hesitate to contact me if I can be of assistance to you.   Following are the goals we discussed today:   Goals Addressed             This Visit's Progress    Mental health -community resource support       Activities and task to complete in order to accomplish goals.   EMOTIONAL / MENTAL HEALTH SUPPORT Keep all upcoming appointment discussed today-Psychiatrist 02/21/23, Therapist 02/03/23 Self Support options  (continue to follow up with current mental health providers to assist with management of depression and anxiety,  please provide paperwork to family members MD to request assessment for personal care assistance, please continue to utilize self care strategies discussed today)         Our next appointment is by telephone on 02/23/23 at 11am  Please call the care guide team at [phone] if you need to cancel or reschedule your appointment.   If you are experiencing a Mental Health or Behavioral Health Crisis or need someone to talk to, please call [phone]   Patient verbalizes understanding of instructions and care plan provided today and agrees to view in MyChart. Active MyChart status and patient understanding of how to access instructions and care plan via MyChart confirmed with patient.     Telephone follow up appointment with care management team member scheduled for: 02/23/23  Michaelle Adolphus, LCSW [place]  [facility], [facility] Licensed Clinical Social Worker Care Coordinator  Direct Dial: [phone]
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
## 2023-02-18 ENCOUNTER — Ambulatory Visit: Payer: Self-pay

## 2023-02-18 NOTE — Telephone Encounter (Signed)
  Chief Complaint: cough Symptoms: dry cough feeling congested, body aches, chills, just not feeling good  Frequency: several days  Pertinent Negatives: Patient denies SOB Disposition: [] ED /[] Urgent Care (no appt availability in office) / [x] Appointment(In office/virtual)/ []  [facility]/ [] Home Care/ [] Refused Recommended Disposition /[] [facility]/ []  Follow-up with PCP Additional Notes: pt states he has above sx. Went and got Nyquil Dayquil yesterday but not noticed improvement and just doesn't feel good. Unsure if has fever dt no way to check. Advised of UC this weekend but unable to schedule appt for UC, pt preferred to schedule with PCP, scheduled VV 02/21/23 at 1120. Care advice given and pt verbalized understanding.   Summary: Dry Cough & Congestion   Pt is calling to report dry cough and congestion. Interested in virtual appt. Please advise         Reason for Disposition  [1] Nasal discharge AND [2] present > 10 days  Answer Assessment - Initial Assessment Questions 1. ONSET: "When did the cough begin?"      Several days ago 2. SEVERITY: "How bad is the cough today?"      worse 3. SPUTUM: "Describe the color of your sputum" (none, dry cough; clear, white, yellow, green)     dry 5. DIFFICULTY BREATHING: "Are you having difficulty breathing?" If Yes, ask: "How bad is it?" (e.g., mild, moderate, severe)    - MILD: No SOB at rest, mild SOB with walking, speaks normally in sentences, can lie down, no retractions, pulse < 100.    - MODERATE: SOB at rest, SOB with minimal exertion and prefers to sit, cannot lie down flat, speaks in phrases, mild retractions, audible wheezing, pulse 100-120.    - SEVERE: Very SOB at rest, speaks in single words, struggling to breathe, sitting hunched forward, retractions, pulse > 120      no 6. FEVER: "Do you have a fever?" If Yes, ask: "What is your temperature, how was it measured, and when did it start?"     Unsure  10. OTHER  SYMPTOMS: "Do you have any other symptoms?" (e.g., runny nose, wheezing, chest pain)       Cough and head congestion, chills, body aches  Protocols used: Cough - Acute Non-Productive-A-AH

## 2023-02-21 ENCOUNTER — Ambulatory Visit
Admission: RE | Admit: 2023-02-21 | Discharge: 2023-02-21 | Disposition: A | Discharge status: Home / Self Care | Payer: 59 | Source: Ambulatory Visit | Attending: Student in an Organized Health Care Education/Training Program | Admitting: Student in an Organized Health Care Education/Training Program

## 2023-02-21 ENCOUNTER — Telehealth (INDEPENDENT_AMBULATORY_CARE_PROVIDER_SITE_OTHER): Payer: 59 | Admitting: Family Medicine

## 2023-02-21 ENCOUNTER — Ambulatory Visit
Payer: 59 | Attending: Student in an Organized Health Care Education/Training Program | Admitting: Student in an Organized Health Care Education/Training Program

## 2023-02-21 ENCOUNTER — Telehealth: Payer: 59 | Admitting: Family Medicine

## 2023-02-21 ENCOUNTER — Encounter: Payer: Self-pay | Admitting: Student in an Organized Health Care Education/Training Program

## 2023-02-21 VITALS — BP 119/90 | HR 90 | Temp 97.6°F | Resp 16 | Ht 67.5 in | Wt 165.0 lb

## 2023-02-21 DIAGNOSIS — M47816 Spondylosis without myelopathy or radiculopathy, lumbar region: Secondary | ICD-10-CM | POA: Diagnosis not present

## 2023-02-21 DIAGNOSIS — R059 Cough, unspecified: Secondary | ICD-10-CM

## 2023-02-21 DIAGNOSIS — G894 Chronic pain syndrome: Secondary | ICD-10-CM | POA: Insufficient documentation

## 2023-02-21 MED ORDER — ROPIVACAINE HCL 2 MG/ML IJ SOLN
18.0000 mL | Freq: Once | INTRAMUSCULAR | Status: AC
  Administered 2023-02-21: 18 mL via PERINEURAL

## 2023-02-21 MED ORDER — LIDOCAINE HCL (PF) 2 % IJ SOLN
INTRAMUSCULAR | Status: AC
  Filled 2023-02-21: qty 10

## 2023-02-21 MED ORDER — ROPIVACAINE HCL 2 MG/ML IJ SOLN
INTRAMUSCULAR | Status: AC
  Filled 2023-02-21: qty 20

## 2023-02-21 MED ORDER — DEXAMETHASONE SODIUM PHOSPHATE 10 MG/ML IJ SOLN
20.0000 mg | Freq: Once | INTRAMUSCULAR | Status: AC
  Administered 2023-02-21: 20 mg

## 2023-02-21 MED ORDER — DEXAMETHASONE SODIUM PHOSPHATE 10 MG/ML IJ SOLN
INTRAMUSCULAR | Status: AC
  Filled 2023-02-21: qty 1

## 2023-02-21 MED ORDER — LIDOCAINE HCL 2 % IJ SOLN
20.0000 mL | Freq: Once | INTRAMUSCULAR | Status: AC
  Administered 2023-02-21: 200 mg

## 2023-02-21 MED ORDER — DEXAMETHASONE SODIUM PHOSPHATE 10 MG/ML IJ SOLN
INTRAMUSCULAR | Status: AC
  Filled 2023-02-21: qty 2

## 2023-02-21 NOTE — Progress Notes (Signed)
Safety precautions to be maintained throughout the outpatient stay will include: orient to surroundings, keep bed in low position, maintain call bell within reach at all times, provide assistance with transfer out of bed and ambulation.

## 2023-02-21 NOTE — Progress Notes (Signed)
Patient's Name: Ryan [name]  MRN: [id number]  Referring Provider: Saralyn Pilar *  DOB: 24-Jul-1955  PCP: Smitty Cords, DO  DOS: 02/21/2023  Note by: Edward Jolly, MD  Service setting: Ambulatory outpatient  Specialty: Interventional Pain Management  Patient type: Established  Location: [facility] (AMB) Pain Management Facility  Visit type: Interventional Procedure   Primary Reason for Visit: Interventional Pain Management Treatment. CC: Back Pain   Procedure:          Anesthesia, Analgesia, Anxiolysis:  Type: Lumbar Facet, Medial Branch Block(s)  Primary Purpose: Therapeutic Region: Posterolateral Lumbosacral Spine Level: L2, L3, L4, L5,  Medial Branch Level(s). Injecting these levels blocks the L3-4, L4-5 lumbar facet joints. Laterality: Bilateral   Local Anesthetic: Lidocaine 1-2%  Position: Prone   Indications: 1. Lumbar spondylosis   2. Lumbar facet arthropathy   3. Chronic pain syndrome    Pain Score: Pre-procedure: 9 /10 Post-procedure: 3 /10  Pre-op Assessment:  Mr. Deyton is a 68 y.o. (year old), male patient, seen today for interventional treatment. He  has a past surgical history that includes Eye surgery; Hernia repair; Transforaminal lumbar interbody fusion (tlif) with pedicle screw fixation 1 level (Right, 06/18/2014); Myringotomy with tube placement (Bilateral, 03/28/2015); Back surgery; Esophagogastroduodenoscopy (egd) with propofol (N/A, 10/24/2015); Cataract extraction w/PHACO (Left, 01/06/2016); Tonsillectomy; Colonoscopy with propofol (N/A, 04/19/2016); and Sinusotomy (02/28/2019). Mr. Blount has a current medication list which includes the following prescription(s): albuterol, albuterol, alprazolam, atorvastatin, breztri aerosphere, gabapentin, lisinopril, oxycodone-acetaminophen, sildenafil, tamsulosin, tizanidine, cephalexin, and doxycycline. His primarily concern today is the Back Pain   Initial Vital Signs:  Pulse/HCG Rate: 90ECG Heart Rate: 95 Temp:  97.6 F (36.4 C) Resp: 16 BP: (!) 129/93 SpO2: 98 %  BMI: Estimated body mass index is 25.46 kg/m as calculated from the following:   Height as of this encounter: 5' 7.5" (1.715 m).   Weight as of this encounter: 165 lb (74.8 kg).  Risk Assessment: Allergies: Reviewed. He is allergic to sertraline.  Allergy Precautions: None required Coagulopathies: Reviewed. None identified.  Blood-thinner therapy: None at this time Active Infection(s): Reviewed. None identified. Mr. Ahonen is afebrile  Site Confirmation: Mr. Iseman was asked to confirm the procedure and laterality before marking the site Procedure checklist: Completed Consent: Before the procedure and under the influence of no sedative(s), amnesic(s), or anxiolytics, the patient was informed of the treatment options, risks and possible complications. To fulfill our ethical and legal obligations, as recommended by the American Medical Association's Code of Ethics, I have informed the patient of my clinical impression; the nature and purpose of the treatment or procedure; the risks, benefits, and possible complications of the intervention; the alternatives, including doing nothing; the risk(s) and benefit(s) of the alternative treatment(s) or procedure(s); and the risk(s) and benefit(s) of doing nothing. The patient was provided information about the general risks and possible complications associated with the procedure. These may include, but are not limited to: failure to achieve desired goals, infection, bleeding, organ or nerve damage, allergic reactions, paralysis, and death. In addition, the patient was informed of those risks and complications associated to Spine-related procedures, such as failure to decrease pain; infection (i.e.: Meningitis, epidural or intraspinal abscess); bleeding (i.e.: epidural hematoma, subarachnoid hemorrhage, or any other type of intraspinal or peri-dural bleeding); organ or nerve damage (i.e.: Any type of  peripheral nerve, nerve root, or spinal cord injury) with subsequent damage to sensory, motor, and/or autonomic systems, resulting in permanent pain, numbness, and/or weakness of one or several areas of  the body; allergic reactions; (i.e.: anaphylactic reaction); and/or death. Furthermore, the patient was informed of those risks and complications associated with the medications. These include, but are not limited to: allergic reactions (i.e.: anaphylactic or anaphylactoid reaction(s)); adrenal axis suppression; blood sugar elevation that in diabetics may result in ketoacidosis or comma; water retention that in patients with history of congestive heart failure may result in shortness of breath, pulmonary edema, and decompensation with resultant heart failure; weight gain; swelling or edema; medication-induced neural toxicity; particulate matter embolism and blood vessel occlusion with resultant organ, and/or nervous system infarction; and/or aseptic necrosis of one or more joints. Finally, the patient was informed that Medicine is not an exact science; therefore, there is also the possibility of unforeseen or unpredictable risks and/or possible complications that may result in a catastrophic outcome. The patient indicated having understood very clearly. We have given the patient no guarantees and we have made no promises. Enough time was given to the patient to ask questions, all of which were answered to the patient's satisfaction. Mr. Farver has indicated that he wanted to continue with the procedure. Attestation: I, the ordering provider, attest that I have discussed with the patient the benefits, risks, side-effects, alternatives, likelihood of achieving goals, and potential problems during recovery for the procedure that I have provided informed consent. Date  Time: 02/21/2023  9:45 AM  Pre-Procedure Preparation:  Monitoring: As per clinic protocol. Respiration, ETCO2, SpO2, BP, heart rate and rhythm  monitor placed and checked for adequate function Safety Precautions: Patient was assessed for positional comfort and pressure points before starting the procedure. Time-out: I initiated and conducted the "Time-out" before starting the procedure, as per protocol. The patient was asked to participate by confirming the accuracy of the "Time Out" information. Verification of the correct person, site, and procedure were performed and confirmed by me, the nursing staff, and the patient. "Time-out" conducted as per Joint Commission's Universal Protocol (UP.01.01.01). Time: 1017  Description of Procedure:          Laterality: Bilateral. The procedure was performed in identical fashion on both sides. Levels:  L3, L4, L5,  Medial Branch Level(s) Area Prepped: Posterior Lumbosacral Region Prepping solution: ChloraPrep (2% chlorhexidine gluconate and 70% isopropyl alcohol) Safety Precautions: Aspiration looking for blood return was conducted prior to all injections. At no point did we inject any substances, as a needle was being advanced. Before injecting, the patient was told to immediately notify me if he was experiencing any new onset of "ringing in the ears, or metallic taste in the mouth". No attempts were made at seeking any paresthesias. Safe injection practices and needle disposal techniques used. Medications properly checked for expiration dates. SDV (single dose vial) medications used. After the completion of the procedure, all disposable equipment used was discarded in the proper designated medical waste containers. Local Anesthesia: Protocol guidelines were followed. The patient was positioned over the fluoroscopy table. The area was prepped in the usual manner. The time-out was completed. The target area was identified using fluoroscopy. A 12-in long, straight, sterile hemostat was used with fluoroscopic guidance to locate the targets for each level blocked. Once located, the skin was marked with an  approved surgical skin marker. Once all sites were marked, the skin (epidermis, dermis, and hypodermis), as well as deeper tissues (fat, connective tissue and muscle) were infiltrated with a Prusinski amount of a short-acting local anesthetic, loaded on a 10cc syringe with a 25G, 1.5-in  Needle. An appropriate amount of time was allowed  for local anesthetics to take effect before proceeding to the next step. Local Anesthetic: Lidocaine 2.0% The unused portion of the local anesthetic was discarded in the proper designated containers. Technical explanation of process:   L2 Medial Branch Nerve Block (MBB): The target area for the L3 medial branch is at the junction of the postero-lateral aspect of the superior articular process and the superior, posterior, and medial edge of the transverse process of L3. Under fluoroscopic guidance, a Quincke needle was inserted until contact was made with os over the superior postero-lateral aspect of the pedicular shadow (target area). After negative aspiration for blood,1 cc  mL of the nerve block solution was injected without difficulty or complication. The needle was removed intact. L3 Medial Branch Nerve Block (MBB): The target area for the L3 medial branch is at the junction of the postero-lateral aspect of the superior articular process and the superior, posterior, and medial edge of the transverse process of L4. Under fluoroscopic guidance, a Quincke needle was inserted until contact was made with os over the superior postero-lateral aspect of the pedicular shadow (target area). After negative aspiration for blood,1 cc  mL of the nerve block solution was injected without difficulty or complication. The needle was removed intact. L4 Medial Branch Nerve Block (MBB): The target area for the L4 medial branch is at the junction of the postero-lateral aspect of the superior articular process and the superior, posterior, and medial edge of the transverse process of L5. Under  fluoroscopic guidance, a Quincke needle was inserted until contact was made with os over the superior postero-lateral aspect of the pedicular shadow (target area). After negative aspiration for blood,1cc mL of the nerve block solution was injected without difficulty or complication. The needle was removed intact. L5 Medial Branch Nerve Block (MBB): The target area for the L5 medial branch is at the junction of the postero-lateral aspect of the superior articular process and the superior, posterior, and medial edge of the sacral ala. Under fluoroscopic guidance, a Quincke needle was inserted until contact was made with os over the superior postero-lateral aspect of the pedicular shadow (target area). After negative aspiration for blood,1cc  mL of the nerve block solution was injected without difficulty or complication. The needle was removed intact.   Nerve block solution: 10 cc solution made of 8 cc of 0.2% ropivacaine, 2 cc of Decadron 10 mg/cc.  1-1.5 cc injected at each level above bilaterally  Procedural Needles: 22-gauge, 3.5-inch, Quincke needles used for all levels.  Once the entire procedure was completed, the treated area was cleaned, making sure to leave some of the prepping solution back to take advantage of its long term bactericidal properties.   Illustration of the posterior view of the lumbar spine and the posterior neural structures. Laminae of L2 through S1 are labeled. DPRL5, dorsal primary ramus of L5; DPRS1, dorsal primary ramus of S1; DPR3, dorsal primary ramus of L3; FJ, facet (zygapophyseal) joint L3-L4; I, inferior articular process of L4; LB1, lateral branch of dorsal primary ramus of L1; IAB, inferior articular branches from L3 medial branch (supplies L4-L5 facet joint); IBP, intermediate branch plexus; MB3, medial branch of dorsal primary ramus of L3; NR3, third lumbar nerve root; S, superior articular process of L5; SAB, superior articular branches from L4 (supplies L4-5 facet  joint also); TP3, transverse process of L3.  Vitals:   02/21/23 1021 02/21/23 1024 02/21/23 1027 02/21/23 1032  BP: (!) 125/100 (!) 118/101 (!) 128/104 (!) 119/90  Pulse:  Resp: (!) 21 17 16    Temp:      TempSrc:      SpO2: 98% 96% 99%   Weight:      Height:         Start Time: 1017 hrs. End Time: 1026 hrs.  Imaging Guidance (Spinal):          Type of Imaging Technique: Fluoroscopy Guidance (Spinal) Indication(s): Assistance in needle guidance and placement for procedures requiring needle placement in or near specific anatomical locations not easily accessible without such assistance. Exposure Time: Please see nurses notes. Contrast: None used. Fluoroscopic Guidance: I was personally present during the use of fluoroscopy. "Tunnel Vision Technique" used to obtain the best possible view of the target area. Parallax error corrected before commencing the procedure. "Direction-depth-direction" technique used to introduce the needle under continuous pulsed fluoroscopy. Once target was reached, antero-posterior, oblique, and lateral fluoroscopic projection used confirm needle placement in all planes. Images permanently stored in EMR. Interpretation: No contrast injected. I personally interpreted the imaging intraoperatively. Adequate needle placement confirmed in multiple planes. Permanent images saved into the patient's record.   Post-operative Assessment:  Post-procedure Vital Signs:  Pulse/HCG Rate: 9085 Temp: 97.6 F (36.4 C) Resp: 16 BP: (!) 119/90 SpO2: 99 %  EBL: None  Complications: No immediate post-treatment complications observed by team, or reported by patient.  Note: The patient tolerated the entire procedure well. A repeat set of vitals were taken after the procedure and the patient was kept under observation following institutional policy, for this type of procedure. Post-procedural neurological assessment was performed, showing return to baseline, prior to  discharge. The patient was provided with post-procedure discharge instructions, including a section on how to identify potential problems. Should any problems arise concerning this procedure, the patient was given instructions to immediately contact us, at any time, without hesitation. In any case, we plan to contact the patient by telephone for a follow-up status report regarding this interventional procedure.  Comments:  No additional relevant information.  Plan of Care   Consider spinal cord stimulator  Imaging Orders         DG PAIN CLINIC C-ARM 1-60 MIN NO REPORT     Medications ordered for procedure: Meds ordered this encounter  Medications   lidocaine (XYLOCAINE) 2 % (with pres) injection 400 mg   ropivacaine (PF) 2 mg/mL (0.2%) (NAROPIN) injection 18 mL   dexamethasone (DECADRON) injection 20 mg   Medications administered: We administered lidocaine, ropivacaine (PF) 2 mg/mL (0.2%), and dexamethasone.  See the medical record for exact dosing, route, and time of administration.  Disposition: Discharge home  Discharge Date & Time: 02/21/2023; 1036 hrs.   Physician-requested Follow-up: Return in about 10 weeks (around 05/02/2023) for PPE, VV.  Future Appointments  Date Time Provider Department Center  02/21/2023  4:00 PM Kasandra Knudsen [facility]  02/23/2023 11:00 AM Verna Czech [place] [facility] None  05/02/2023  4:00 PM Edward Jolly, MD [facility] None  07/01/2023  1:00 PM [facility]-ANNUAL WELLNESS VISIT [facility]    Primary Care Physician: Smitty Cords, DO Location: [facility] Management Facility Note by: Edward Jolly, MD Date: 02/21/2023; Time: 10:44 AM  Disclaimer:  Medicine is not an exact science. The only guarantee in medicine is that nothing is guaranteed. It is important to note that the decision to proceed with this intervention was based on the information collected from the patient. The Data and conclusions were drawn from the  patient's questionnaire, the interview, and the  physical examination. Because the information was provided in large part by the patient, it cannot be guaranteed that it has not been purposely or unconsciously manipulated. Every effort has been made to obtain as much relevant data as possible for this evaluation. It is important to note that the conclusions that lead to this procedure are derived in large part from the available data. Always take into account that the treatment will also be dependent on availability of resources and existing treatment guidelines, considered by other Pain Management Practitioners as being common knowledge and practice, at the time of the intervention. For Medico-Legal purposes, it is also important to point out that variation in procedural techniques and pharmacological choices are the acceptable norm. The indications, contraindications, technique, and results of the above procedure should only be interpreted and judged by a Board-Certified Interventional Pain Specialist with extensive familiarity and expertise in the same exact procedure and technique.

## 2023-02-21 NOTE — Progress Notes (Signed)
Patient scheduled for virtual video visit today. Not signed in prior to visit. He does not have MyChart access. I attempted to call him around 410pm today when I was free to proceed with his visit. He did not answer phone and voicemail was full. Our clinic attempted to send him a link. No successful video visit completed today.  Saralyn Pilar, DO [facility] 02/21/2023, 4:33 PM

## 2023-02-21 NOTE — Patient Instructions (Signed)
____________________________________________________________________________________________  Post-Procedure Discharge Instructions  Instructions:  Apply ice:   Purpose: This will minimize any swelling and discomfort after procedure.   When: Day of procedure, as soon as you get home.  How: Fill a plastic sandwich bag with crushed ice. Cover it with a Indelicato towel and apply to injection site.  How long: (15 min on, 15 min off) Apply for 15 minutes then remove x 15 minutes.  Repeat sequence on day of procedure, until you go to bed.  Apply heat:   Purpose: To treat any soreness and discomfort from the procedure.  When: Starting the next day after the procedure.  How: Apply heat to procedure site starting the day following the procedure.  How long: May continue to repeat daily, until discomfort goes away.  Food intake: Start with clear liquids (like water) and advance to regular food, as tolerated.   Physical activities: Keep activities to a minimum for the first 8 hours after the procedure. After that, then as tolerated.  Driving: If you have received any sedation, be responsible and do not drive. You are not allowed to drive for 24 hours after having sedation.  Blood thinner: (Applies only to those taking blood thinners) You may restart your blood thinner 6 hours after your procedure.  Insulin: (Applies only to Diabetic patients taking insulin) As soon as you can eat, you may resume your normal dosing schedule.  Infection prevention: Keep procedure site clean and dry. Shower daily and clean area with soap and water.  Post-procedure Pain Diary: Extremely important that this be done correctly and accurately. Recorded information will be used to determine the next step in treatment. For the purpose of accuracy, follow these rules:  Evaluate only the area treated. Do not report or include pain from an untreated area. For the purpose of this evaluation, ignore all other areas of pain,  except for the treated area.  After your procedure, avoid taking a long nap and attempting to complete the pain diary after you wake up. Instead, set your alarm clock to go off every hour, on the hour, for the initial 8 hours after the procedure. Document the duration of the numbing medicine, and the relief you are getting from it.  Do not go to sleep and attempt to complete it later. It will not be accurate. If you received sedation, it is likely that you were given a medication that may cause amnesia. Because of this, completing the diary at a later time may cause the information to be inaccurate. This information is needed to plan your care.  Follow-up appointment: Keep your post-procedure follow-up evaluation appointment after the procedure (usually 2 weeks for most procedures, 6 weeks for radiofrequencies). DO NOT FORGET to bring you pain diary with you.   Expect: (What should I expect to see with my procedure?)  From numbing medicine (AKA: Local Anesthetics): Numbness or decrease in pain. You may also experience some weakness, which if present, could last for the duration of the local anesthetic.  Onset: Full effect within 15 minutes of injected.  Duration: It will depend on the type of local anesthetic used. On the average, 1 to 8 hours.   From steroids (Applies only if steroids were used): Decrease in swelling or inflammation. Once inflammation is improved, relief of the pain will follow.  Onset of benefits: Depends on the amount of swelling present. The more swelling, the longer it will take for the benefits to be seen. In some cases, up to 10 days.  Duration: Steroids will stay in the system x 2 weeks. Duration of benefits will depend on multiple posibilities including persistent irritating factors.  Side-effects: If present, they may typically last 2 weeks (the duration of the steroids).  Frequent: Cramps (if they occur, drink Gatorade and take over-the-counter Magnesium 450-500 mg  once to twice a day); water retention with temporary weight gain; increases in blood sugar; decreased immune system response; increased appetite.  Occasional: Facial flushing (red, warm cheeks); mood swings; menstrual changes.  Uncommon: Long-term decrease or suppression of natural hormones; bone thinning. (These are more common with higher doses or more frequent use. This is why we prefer that our patients avoid having any injection therapies in other practices.)   Very Rare: Severe mood changes; psychosis; aseptic necrosis.  From procedure: Some discomfort is to be expected once the numbing medicine wears off. This should be minimal if ice and heat are applied as instructed.  Call if: (When should I call?)  You experience numbness and weakness that gets worse with time, as opposed to wearing off.  New onset bowel or bladder incontinence. (Applies only to procedures done in the spine)  Emergency Numbers:  Durning business hours (Monday - Thursday, 8:00 AM - 4:00 PM) (Friday, 9:00 AM - 12:00 Noon): [phone]  After hours: [phone]  NOTE: If you are having a problem and are unable connect with, or to talk to a provider, then go to your nearest urgent care or emergency department. If the problem is serious and urgent, please call 911. ____________________________________________________________________________________________

## 2023-02-22 ENCOUNTER — Telehealth: Payer: Self-pay | Admitting: *Deleted

## 2023-02-22 NOTE — Telephone Encounter (Signed)
Attempted to call for post procedure follow-up. No answer, mailbox full.

## 2023-02-23 ENCOUNTER — Encounter: Payer: Self-pay | Admitting: *Deleted

## 2023-02-23 ENCOUNTER — Telehealth: Payer: Self-pay | Admitting: *Deleted

## 2023-02-23 NOTE — Patient Outreach (Signed)
  Care Coordination   02/23/2023 Name: Ryan [name] MRN: [id number] DOB: [birth year]   Care Coordination Outreach Attempts:  An unsuccessful telephone outreach was attempted today to offer the patient information about available complex care management services.  Follow Up Plan:  Additional outreach attempts will be made to offer the patient complex care management information and services.   Encounter Outcome:  No Answer   Care Coordination Interventions:  No, not indicated    Osmel Dykstra, LCSW [place]  [facility] Licensed Clinical Social Worker Care Coordinator  Direct Dial: [phone]

## 2023-03-07 ENCOUNTER — Telehealth: Payer: Self-pay

## 2023-03-07 ENCOUNTER — Other Ambulatory Visit: Payer: Self-pay | Admitting: Family Medicine

## 2023-03-07 DIAGNOSIS — N401 Enlarged prostate with lower urinary tract symptoms: Secondary | ICD-10-CM

## 2023-03-07 DIAGNOSIS — N529 Male erectile dysfunction, unspecified: Secondary | ICD-10-CM

## 2023-03-07 NOTE — Telephone Encounter (Signed)
 Reason for Referral: referral to Urology for transfer of care from [facility] to local option for management of Erectile Dysfunction and BPH. He cannot travel to [facility] anymore, needs local Urology transfer to [facility]  Has the referral been discussed with the patient?: yes  Designated contact for the referral if not the patient (name/phone number):  Has the patient seen a specialist for this issue before?: yes  If so, who (practice/provider)? [facility]  Does the patient have a provider or location preference for the referral?: Yes - [facility] Would the patient like to see previous specialist if applicable?   Domingo Friend, DO [facility] 03/07/2023, 5:34 PM

## 2023-03-07 NOTE — Telephone Encounter (Signed)
 Medication Refill -  Most Recent Primary Care Visit:  Provider: Raina Bunting  Department: [facility]  Visit Type: SAME DAY  Date: 01/14/2023  Medication: tamsulosin  (FLOMAX ) 0.4 MG CAPS capsule sildenafil  (REVATIO ) 20 MG tablet  Has the patient contacted their pharmacy? No  Is this the correct pharmacy for this prescription? Yes  This is the patient's preferred pharmacy:  [facility] [address] (N), [place] - [address] Rufina Cough) [place] [address] Phone: [phone] Fax: [phone]   Has the prescription been filled recently? No  Is the patient out of the medication? No Pt has two days worth of medication left.   Has the patient been seen for an appointment in the last year OR does the patient have an upcoming appointment? Yes  Can we respond through MyChart? No  Agent: Please be advised that Rx refills may take up to 3 business days. We ask that you follow-up with your pharmacy.

## 2023-03-07 NOTE — Addendum Note (Signed)
 Addended by: Raina Bunting on: 03/07/2023 05:34 PM   Modules accepted: Orders

## 2023-03-07 NOTE — Telephone Encounter (Signed)
 Copied from CRM [id number]. Topic: Referral - Request for Referral >> Mar 07, 2023 10:02 AM Bearl Botts A wrote: Reason for CRM: Referral for Urology  Did the patient discuss referral with their provider in the last year? Yes     Type of order/referral and detailed reason for visit: Patient states that he was referred to Urology in [facility] and he has had two strokes and the Urologists has re secheduled his appointment twice. Patient states due to the strokes he gets turned around and wants a Urologist in the [place] area and states it is an  emergency due to him almost running out of medication.   Preference of office, provider, location: [place] area  If referral order, have you been seen by this specialty before? Yes (If Yes, this issue or another issue? When? Where?  Can we respond through MyChart? No

## 2023-03-08 MED ORDER — TAMSULOSIN HCL 0.4 MG PO CAPS: 0.4000 mg | ORAL_CAPSULE | Freq: Two times a day (BID) | ORAL | 3 refills | Status: AC

## 2023-03-08 MED ORDER — SILDENAFIL CITRATE 20 MG PO TABS: 80.0000 mg | ORAL_TABLET | ORAL | 5 refills | Status: DC | PRN

## 2023-03-08 NOTE — Telephone Encounter (Signed)
Requested medication (s) are due for refill today - no  Requested medication (s) are on the active medication list -yes  Future visit scheduled -no  Last refill: sildenafil 11/19/22 #60 5RF                 Tamsulosin  11/19/22 #180 1RF  Notes to clinic: fails lab protocol- over 1 year- 11/18/21, too soon for RF  Requested Prescriptions  Pending Prescriptions Disp Refills   tamsulosin (FLOMAX) 0.4 MG CAPS capsule 180 capsule 3    Sig: Take 1 capsule (0.4 mg total) by mouth in the morning and at bedtime.     Urology: Alpha-Adrenergic Blocker Failed - 03/08/2023 12:03 PM      Failed - PSA in normal range and within 360 days    PSA  Date Value Ref Range Status  11/18/2021 6.61 (H) < OR = 4.00 ng/mL Final    Comment:    The total PSA value from this assay system is  standardized against the WHO standard. The test  result will be approximately 20% lower when compared  to the equimolar-standardized total PSA (Beckman  Coulter). Comparison of serial PSA results should be  interpreted with this fact in mind. . This test was performed using the Siemens  chemiluminescent method. Values obtained from  different assay methods cannot be used interchangeably. PSA levels, regardless of value, should not be interpreted as absolute evidence of the presence or absence of disease.          Failed - Last BP in normal range    BP Readings from Last 1 Encounters:  02/21/23 (!) 119/90         Passed - Valid encounter within last 12 months    Recent Outpatient Visits           2 weeks ago Cough, unspecified type   [place] [facility] [place], Netta Neat, DO   1 month ago Acute cystitis without hematuria   [facility] [place], Netta Neat, DO   2 months ago Chronic bilateral low back pain without sciatica   [place] [facility], Netta Neat, DO   3 months ago Degeneration of intervertebral disc of  lumbosacral region with discogenic back pain   [place] [facility] Smitty Cords, DO   5 months ago Lumbar radiculopathy   [place] [facility] Smitty Cords, DO               sildenafil (REVATIO) 20 MG tablet 60 tablet 5    Sig: Take 4-5 tablets (80-100 mg total) by mouth as needed (ED).     Urology: Erectile Dysfunction Agents Failed - 03/08/2023 12:03 PM      Failed - AST in normal range and within 360 days    AST  Date Value Ref Range Status  11/29/2021 20 15 - 41 U/L Final   SGOT(AST)  Date Value Ref Range Status  07/15/2011 21 15 - 37 Unit/L Final         Failed - ALT in normal range and within 360 days    ALT  Date Value Ref Range Status  11/29/2021 14 0 - 44 U/L Final   SGPT (ALT)  Date Value Ref Range Status  07/15/2011 21 U/L Final    Comment:    12-78 NOTE: NEW REFERENCE RANGE 12/18/2010          Failed - Last BP in  normal range    BP Readings from Last 1 Encounters:  02/21/23 (!) 119/90         Passed - Valid encounter within last 12 months    Recent Outpatient Visits           2 weeks ago Cough, unspecified type   [place] [facility], Netta Neat, DO   1 month ago Acute cystitis without hematuria   [place] [facility] [place], Netta Neat, DO   2 months ago Chronic bilateral low back pain without sciatica   [facility] [place], Netta Neat, DO   3 months ago Degeneration of intervertebral disc of lumbosacral region with discogenic back pain   [place] [facility] Smitty Cords, DO   5 months ago Lumbar radiculopathy   [place] [facility] Smitty Cords, DO                 Requested Prescriptions  Pending Prescriptions Disp Refills   tamsulosin (FLOMAX) 0.4 MG CAPS capsule 180 capsule 3    Sig: Take 1 capsule (0.4 mg total) by  mouth in the morning and at bedtime.     Urology: Alpha-Adrenergic Blocker Failed - 03/08/2023 12:03 PM      Failed - PSA in normal range and within 360 days    PSA  Date Value Ref Range Status  11/18/2021 6.61 (H) < OR = 4.00 ng/mL Final    Comment:    The total PSA value from this assay system is  standardized against the WHO standard. The test  result will be approximately 20% lower when compared  to the equimolar-standardized total PSA (Beckman  Coulter). Comparison of serial PSA results should be  interpreted with this fact in mind. . This test was performed using the Siemens  chemiluminescent method. Values obtained from  different assay methods cannot be used interchangeably. PSA levels, regardless of value, should not be interpreted as absolute evidence of the presence or absence of disease.          Failed - Last BP in normal range    BP Readings from Last 1 Encounters:  02/21/23 (!) 119/90         Passed - Valid encounter within last 12 months    Recent Outpatient Visits           2 weeks ago Cough, unspecified type   [place] [facility] [place], Netta Neat, DO   1 month ago Acute cystitis without hematuria   [place] [facility] [place], Netta Neat, DO   2 months ago Chronic bilateral low back pain without sciatica   [place] [facility], Netta Neat, DO   3 months ago Degeneration of intervertebral disc of lumbosacral region with discogenic back pain   [place] [facility] Smitty Cords, DO   5 months ago Lumbar radiculopathy    [facility] Smitty Cords, DO               sildenafil (REVATIO) 20 MG tablet 60 tablet 5    Sig: Take 4-5 tablets (80-100 mg total) by mouth as needed (ED).     Urology: Erectile Dysfunction Agents Failed - 03/08/2023 12:03 PM      Failed - AST in normal range and within 360 days     AST  Date Value Ref Range  Status  11/29/2021 20 15 - 41 U/L Final   SGOT(AST)  Date Value Ref Range Status  07/15/2011 21 15 - 37 Unit/L Final         Failed - ALT in normal range and within 360 days    ALT  Date Value Ref Range Status  11/29/2021 14 0 - 44 U/L Final   SGPT (ALT)  Date Value Ref Range Status  07/15/2011 21 U/L Final    Comment:    12-78 NOTE: NEW REFERENCE RANGE 12/18/2010          Failed - Last BP in normal range    BP Readings from Last 1 Encounters:  02/21/23 (!) 119/90         Passed - Valid encounter within last 12 months    Recent Outpatient Visits           2 weeks ago Cough, unspecified type   [place] [facility] Smitty Cords, DO   1 month ago Acute cystitis without hematuria   [place] [facility], Netta Neat, DO   2 months ago Chronic bilateral low back pain without sciatica   [place] [facility] [place], Netta Neat, DO   3 months ago Degeneration of intervertebral disc of lumbosacral region with discogenic back pain    [facility] [place], Netta Neat, DO   5 months ago Lumbar radiculopathy   [facility] [place], Netta Neat, [place]

## 2023-03-09 NOTE — Progress Notes (Addendum)
PA  started   Ryan [name] (Key: [id number]) Rx #: [id number] Need Help? Call us at [phone] Status New (Not sent to plan) Drug Sildenafil Citrate (PAH) 20MG  tablets ePA cloud logo Form [employer] Medicare Part D Electronic Prior Authorization Form (2017 NCPDP) Original Claim Info [id number]  We are unable to process your request for prior authorization for SILDENAFIL TAB 20MG  for the above member due to [employer] has a denied request on file for SILDENAFIL TAB 20MG  for this member. Please refer to the appeals process outlined in the original denial or contact Prior Authorization Department at [phone] for further questions.

## 2023-03-18 ENCOUNTER — Other Ambulatory Visit: Payer: Self-pay | Admitting: Family Medicine

## 2023-03-18 DIAGNOSIS — M5416 Radiculopathy, lumbar region: Secondary | ICD-10-CM

## 2023-03-18 DIAGNOSIS — M545 Low back pain, unspecified: Secondary | ICD-10-CM

## 2023-03-18 DIAGNOSIS — G8929 Other chronic pain: Secondary | ICD-10-CM

## 2023-03-18 DIAGNOSIS — M5137 Other intervertebral disc degeneration, lumbosacral region with discogenic back pain only: Secondary | ICD-10-CM

## 2023-03-18 NOTE — Telephone Encounter (Signed)
Copied from CRM [id number]. Topic: Clinical - Medication Refill >> Mar 18, 2023  4:19 PM Antony Haste wrote: Most Recent Primary Care Visit:  Provider: Smitty Cords  Department: [facility]  Visit Type: SAME DAY  Date: 01/14/2023  Medication: oxyCODONE-acetaminophen (PERCOCET) 7.5-325 MG tablet  Has the patient contacted their pharmacy? No (Agent: If no, request that the patient contact the pharmacy for the refill. If patient does not wish to contact the pharmacy document the reason why and proceed with request.) (Agent: If yes, when and what did the pharmacy advise?)  Is this the correct pharmacy for this prescription? Yes If no, delete pharmacy and type the correct one.  This is the patient's preferred pharmacy:  [facility] [address] ([address]),  - [address] [place]) [place] [address] Phone: [phone] Fax: [phone]   Has the prescription been filled recently? No  Is the patient out of the medication? Yes  Has the patient been seen for an appointment in the last year OR does the patient have an upcoming appointment? Yes  Can we respond through MyChart? No, callback preferred.  Agent: Please be advised that Rx refills may take up to 3 business days. We ask that you follow-up with your pharmacy.

## 2023-03-21 MED ORDER — OXYCODONE-ACETAMINOPHEN 7.5-325 MG PO TABS: 1.0000 | ORAL_TABLET | ORAL | 0 refills | Status: DC | PRN

## 2023-03-21 NOTE — Telephone Encounter (Signed)
 Requested medication (s) are due for refill today - yes  Requested medication (s) are on the active medication list -yes  Future visit scheduled -no  Last refill: 01/14/23 #30  Notes to clinic: non delegated Rx  Requested Prescriptions  Pending Prescriptions Disp Refills   oxyCODONE-acetaminophen (PERCOCET) 7.5-325 MG tablet 30 tablet 0    Sig: Take 1 tablet by mouth every 4 (four) hours as needed for severe pain (pain score 7-10).     Not Delegated - Analgesics:  Opioid Agonist Combinations Failed - 03/21/2023 11:55 AM      Failed - This refill cannot be delegated      Failed - Urine Drug Screen completed in last 360 days      Passed - Valid encounter within last 3 months    Recent Outpatient Visits           4 weeks ago Cough, unspecified type   [place] [facility] [place], Netta Neat, DO   2 months ago Acute cystitis without hematuria   [place] [facility] [place], Netta Neat, DO   2 months ago Chronic bilateral low back pain without sciatica   [place] [facility] [place], Netta Neat, DO   4 months ago Degeneration of intervertebral disc of lumbosacral region with discogenic back pain   [place] [facility] Smitty Cords, DO   5 months ago Lumbar radiculopathy   [place] [facility] [place], Netta Neat, DO       Future Appointments             In 1 month [name], Verna Czech, MD [facility] [place]               Requested Prescriptions  Pending Prescriptions Disp Refills   oxyCODONE-acetaminophen (PERCOCET) 7.5-325 MG tablet 30 tablet 0    Sig: Take 1 tablet by mouth every 4 (four) hours as needed for severe pain (pain score 7-10).     Not Delegated - Analgesics:  Opioid Agonist Combinations Failed - 03/21/2023 11:55 AM      Failed - This refill cannot be delegated      Failed - Urine Drug Screen completed in last 360 days       Passed - Valid encounter within last 3 months    Recent Outpatient Visits           4 weeks ago Cough, unspecified type   [place] [facility] [place], Netta Neat, DO   2 months ago Acute cystitis without hematuria   [place] [facility] [place], Netta Neat, DO   2 months ago Chronic bilateral low back pain without sciatica   [place] [place], Netta Neat, DO   4 months ago Degeneration of intervertebral disc of lumbosacral region with discogenic back pain   [place] [facility] Smitty Cords, DO   5 months ago Lumbar radiculopathy    [facility] Smitty Cords, DO       Future Appointments             In 1 month [name], Verna Czech, MD [facility]

## 2023-04-13 ENCOUNTER — Other Ambulatory Visit: Payer: Self-pay | Admitting: Family Medicine

## 2023-04-13 ENCOUNTER — Ambulatory Visit: Payer: Self-pay | Admitting: *Deleted

## 2023-04-13 DIAGNOSIS — G8929 Other chronic pain: Secondary | ICD-10-CM

## 2023-04-13 DIAGNOSIS — M5416 Radiculopathy, lumbar region: Secondary | ICD-10-CM

## 2023-04-13 DIAGNOSIS — M5137 Other intervertebral disc degeneration, lumbosacral region with discogenic back pain only: Secondary | ICD-10-CM

## 2023-04-13 NOTE — Telephone Encounter (Signed)
 Copied from CRM [id number]. Topic: Clinical - Medication Refill >> Apr 13, 2023 12:08 PM Franchot Heidelberg wrote: Most Recent Primary Care Visit:  Provider: Smitty Cords  Department: [facility]  Visit Type: SAME DAY  Date: 01/14/2023  Medication: oxyCODONE-acetaminophen (PERCOCET) 7.5-325 MG tablet  Has the patient contacted their pharmacy? No (Agent: If no, request that the patient contact the pharmacy for the refill. If patient does not wish to contact the pharmacy document the reason why and proceed with request.) (Agent: If yes, when and what did the pharmacy advise?)  Is this the correct pharmacy for this prescription? Yes If no, delete pharmacy and type the correct one.  This is the patient's preferred pharmacy:   [facility] [address] (N), [place] - [address] [place]) [place] [address] Phone: [phone] Fax: [phone]   Has the prescription been filled recently? Yes  Is the patient out of the medication? Has one and a half left   Has the patient been seen for an appointment in the last year OR does the patient have an upcoming appointment? Yes  Can we respond through MyChart? No  Agent: Please be advised that Rx refills may take up to 3 business days. We ask that you follow-up with your pharmacy.

## 2023-04-13 NOTE — Telephone Encounter (Signed)
  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 Topic: Clinical - Red Word Triage >> Apr 13, 2023 12:11 PM Franchot Heidelberg wrote: Red Word that prompted transfer to Nurse Triage: Pt has severe back pain, needs surgery. Requesting pain medicine. Reason for Disposition  Back pain is a chronic symptom (recurrent or ongoing AND present > 4 weeks)  Answer Assessment - Initial Assessment Questions 1. ONSET: "When did the pain begin?"      Chronic back pain- needs surgery- but ahs to schedule around care of niece 2. LOCATION: "Where does it hurt?" (upper, mid or lower back)     Mid to lower back 3. SEVERITY: "How bad is the pain?"  (e.g., Scale 1-10; mild, moderate, or severe)   - MILD (1-3): Doesn't interfere with normal activities.    - MODERATE (4-7): Interferes with normal activities or awakens from sleep.    - SEVERE (8-10): Excruciating pain, unable to do any normal activities.      7-8/10 4. PATTERN: "Is the pain constant?" (e.g., yes, no; constant, intermittent)      constant 5. RADIATION: "Does the pain shoot into your legs or somewhere else?"     Radiates into legs- he does see pain management for injections in spine 6. CAUSE:  "What do you think is causing the back pain?"      Previous surgery- work injury 7. BACK OVERUSE:  "Any recent lifting of heavy objects, strenuous work or exercise?"     Patient is care giver 8. MEDICINES: "What have you taken so far for  the pain?" (e.g., nothing, acetaminophen, NSAIDS)     Tylenol, BC- oxycodone 7.5 patient has 1 1/2 tablets left and has requested RF  Protocols used: Back Pain-A-AH

## 2023-04-14 ENCOUNTER — Encounter: Payer: Self-pay | Admitting: Family Medicine

## 2023-04-14 ENCOUNTER — Ambulatory Visit (INDEPENDENT_AMBULATORY_CARE_PROVIDER_SITE_OTHER): Admitting: Family Medicine

## 2023-04-14 VITALS — BP 140/84 | HR 84 | Ht 67.5 in | Wt 172.0 lb

## 2023-04-14 DIAGNOSIS — M5416 Radiculopathy, lumbar region: Secondary | ICD-10-CM | POA: Diagnosis not present

## 2023-04-14 DIAGNOSIS — M545 Low back pain, unspecified: Secondary | ICD-10-CM

## 2023-04-14 DIAGNOSIS — I10 Essential (primary) hypertension: Secondary | ICD-10-CM | POA: Diagnosis not present

## 2023-04-14 DIAGNOSIS — G8929 Other chronic pain: Secondary | ICD-10-CM

## 2023-04-14 DIAGNOSIS — M5137 Other intervertebral disc degeneration, lumbosacral region with discogenic back pain only: Secondary | ICD-10-CM

## 2023-04-14 MED ORDER — OXYCODONE-ACETAMINOPHEN 7.5-325 MG PO TABS: 1.0000 | ORAL_TABLET | ORAL | 0 refills | Status: DC | PRN

## 2023-04-14 MED ORDER — LISINOPRIL 20 MG PO TABS: 20.0000 mg | ORAL_TABLET | Freq: Every day | ORAL | 1 refills | Status: DC

## 2023-04-14 NOTE — Patient Instructions (Addendum)
 Thank you for coming to the office today.  Refilled pain medication today Oxycodone  Refilled Lisinopril. Keep monitoring blood pressure Only mild elevated today  Call office for refill on pain medicine several days before run out.  Please schedule a Follow-up Appointment to: Return in about 3 months (around 07/15/2023) for 3 month early afternoon, Pain Management refills.  If you have any other questions or concerns, please feel free to call the office or send a message through MyChart. You may also schedule an earlier appointment if necessary.  Additionally, you may be receiving a survey about your experience at our office within a few days to 1 week by e-mail or mail. We value your feedback.  Saralyn Pilar, DO [facility] [place]

## 2023-04-14 NOTE — Telephone Encounter (Signed)
 Requested medication (s) are due for refill today - yes  Requested medication (s) are on the active medication list -yes  Future visit scheduled -yes-today  Last refill: 03/21/23 #30  Notes to clinic: non delegated Rx  Requested Prescriptions  Pending Prescriptions Disp Refills   oxyCODONE-acetaminophen (PERCOCET) 7.5-325 MG tablet 30 tablet 0    Sig: Take 1 tablet by mouth every 4 (four) hours as needed for severe pain (pain score 7-10).     Not Delegated - Analgesics:  Opioid Agonist Combinations Failed - 04/14/2023  9:02 AM      Failed - This refill cannot be delegated      Failed - Urine Drug Screen completed in last 360 days      Failed - Valid encounter within last 3 months    Recent Outpatient Visits           1 month ago Cough, unspecified type   [place] [facility] [place], Netta Neat, DO   3 months ago Acute cystitis without hematuria   [place] [facility] [place], Netta Neat, DO   3 months ago Chronic bilateral low back pain without sciatica   [place] [facility] [place], Netta Neat, DO   4 months ago Degeneration of intervertebral disc of lumbosacral region with discogenic back pain   [place] [facility] Smitty Cords, DO   6 months ago Lumbar radiculopathy   [facility] [place], Netta Neat, DO       Future Appointments             In 6 days [name], Verna Czech, MD [facility] [place]               Requested Prescriptions  Pending Prescriptions Disp Refills   oxyCODONE-acetaminophen (PERCOCET) 7.5-325 MG tablet 30 tablet 0    Sig: Take 1 tablet by mouth every 4 (four) hours as needed for severe pain (pain score 7-10).     Not Delegated - Analgesics:  Opioid Agonist Combinations Failed - 04/14/2023  9:02 AM      Failed - This refill cannot be delegated      Failed - Urine Drug Screen completed in last 360  days      Failed - Valid encounter within last 3 months    Recent Outpatient Visits           1 month ago Cough, unspecified type   [place] [facility] [place], Netta Neat, DO   3 months ago Acute cystitis without hematuria   [place] [facility] [place], Netta Neat, DO   3 months ago Chronic bilateral low back pain without sciatica   [place] [facility] [place], Netta Neat, DO   4 months ago 

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
## 2023-04-20 ENCOUNTER — Ambulatory Visit: Payer: 59 | Admitting: Urology

## 2023-04-20 ENCOUNTER — Encounter: Payer: Self-pay | Admitting: Urology

## 2023-04-29 ENCOUNTER — Telehealth: Payer: Self-pay

## 2023-04-29 NOTE — Telephone Encounter (Signed)
 Spoke with patient and went over pre-appointment nurse questions for telephone visit with Dr. Cherylann Ratel on 05/02/23.

## 2023-05-02 ENCOUNTER — Ambulatory Visit
Payer: 59 | Attending: Student in an Organized Health Care Education/Training Program | Admitting: Student in an Organized Health Care Education/Training Program

## 2023-05-02 DIAGNOSIS — M961 Postlaminectomy syndrome, not elsewhere classified: Secondary | ICD-10-CM | POA: Diagnosis not present

## 2023-05-02 DIAGNOSIS — G894 Chronic pain syndrome: Secondary | ICD-10-CM | POA: Diagnosis not present

## 2023-05-02 DIAGNOSIS — M47816 Spondylosis without myelopathy or radiculopathy, lumbar region: Secondary | ICD-10-CM

## 2023-05-02 NOTE — Progress Notes (Signed)
 Patient: Ryan [name]  Service Category: E/M  Provider: Edward Jolly, MD  DOB: March 08, 1955  DOS: 05/02/2023  Location: Office  MRN: [id number]  Setting: Ambulatory outpatient  Referring Provider: Saralyn Pilar *  Type: Established Patient  Specialty: Interventional Pain Management  PCP: Smitty Cords, DO  Location: Remote location  Delivery: TeleHealth     Virtual Encounter - Pain Management PROVIDER NOTE: Information contained herein reflects review and annotations entered in association with encounter. Interpretation of such information and data should be left to medically-trained personnel. Information provided to patient can be located elsewhere in the medical record under "Patient Instructions". Document created using STT-dictation technology, any transcriptional errors that may result from process are unintentional.    Contact & Pharmacy Preferred: [phone] Home: [phone] (home) Mobile: There is no such number on file (mobile). E-mail: [facility], operational Pharmacy [address]), [place] - [address] [place]) [place] [address] Phone: [phone] Fax: [phone]   Pre-screening  Ryan [name] offered "in-person" vs "virtual" encounter. He indicated preferring virtual for this encounter.   Reason COVID-19*  Social distancing based on CDC and AMA recommendations.   I contacted Ryan [name] on 05/02/2023 via telephone.      I clearly identified myself as Edward Jolly, MD. I verified that I was speaking with the correct person using two identifiers (Name: Ryan [name], and date of birth: 28-Feb-1955).  Consent I sought verbal advanced consent from Ryan [name] for virtual visit interactions. I informed Ryan [name] of possible security and privacy concerns, risks, and limitations associated with providing "not-in-person" medical evaluation and management services. I also informed Ryan [name] of the availability of  "in-person" appointments. Finally, I informed him that there would be a charge for the virtual visit and that he could be  personally, fully or partially, financially responsible for it. Ryan [name] expressed understanding and agreed to proceed.   Historic Elements   Ryan [name] is a 68 y.o. year old, male patient evaluated today after our last contact on 02/21/2023. Ryan [name]  has a past medical history of Anxiety, Arthritis, Asthma, Chronic kidney disease, COPD (chronic obstructive pulmonary disease) (HCC), Dyspnea, GERD (gastroesophageal reflux disease), Hyperlipidemia, Hypertension, Lower urinary tract symptoms (LUTS), Prostate disease, Stroke [facility]), Thyroid disease, Wears dentures, and Wears hearing aid. He also  has a past surgical history that includes Eye surgery; Hernia repair; Transforaminal lumbar interbody fusion (tlif) with pedicle screw fixation 1 level (Right, 06/18/2014); Myringotomy with tube placement (Bilateral, 03/28/2015); Back surgery; Esophagogastroduodenoscopy (egd) with propofol (N/A, 10/24/2015); Cataract extraction w/PHACO (Left, 01/06/2016); Tonsillectomy; Colonoscopy with propofol (N/A, 04/19/2016); and Sinusotomy (02/28/2019). Ryan [name] has a current medication list which includes the following prescription(s): albuterol, albuterol, alprazolam, atorvastatin, breztri aerosphere, gabapentin, lisinopril, oxycodone-acetaminophen, sildenafil, tamsulosin, and tizanidine. He  reports that he has been smoking cigarettes. He has a 60 pack-year smoking history. He uses smokeless tobacco. He reports current alcohol use. He reports current drug use. Drugs: Marijuana and "Crack" cocaine. Ryan [name] is allergic to sertraline.  BMI: Estimated body mass index is 26.54 kg/m as calculated from the following:   Height as of 04/14/23: 5' 7.5" (1.715 m).   Weight as of 04/14/23: 172 lb (78 kg). Last encounter: 08/26/2022. Last procedure: 10/13/2022.  HPI  Today, he is being contacted for a post-procedure  assessment.   Post-procedure evaluation   Type: Lumbar Facet, Medial Branch Block(s) #2,  2024 Primary Purpose: Therapeutic Region: Posterolateral Lumbosacral Spine Level: L2,  L3, L4, L5,  Medial Branch Level(s). Injecting these levels blocks the L3-4, L4-5 lumbar facet joints. Laterality: Bilateral     Local Anesthetic: Lidocaine 1-2%   Position: Prone    Indications: 1. Lumbar spondylosis   2. Lumbar facet arthropathy   3. Failed back surgical syndrome   4. Chronic pain syndrome        Effectiveness:  Initial hour after procedure: 100 %  Subsequent 4-6 hours post-procedure: 100 %  Analgesia past initial 6 hours: 50% for the first 3-4 weeks Ongoing improvement:  Analgesic:  back to baseline Function: Back to baseline ROM: Back to baseline   Laboratory Chemistry Profile   Renal Lab Results  Component Value Date   BUN 12 11/30/2021   CREATININE 0.75 11/30/2021   BCR SEE NOTE: 11/18/2021   GFRAA >60 09/11/2019   GFRNONAA >60 11/30/2021    Hepatic Lab Results  Component Value Date   AST 20 11/29/2021   ALT 14 11/29/2021   ALBUMIN 3.7 11/29/2021   ALKPHOS 119 11/29/2021   LIPASE 36 11/29/2021    Electrolytes Lab Results  Component Value Date   NA 137 11/30/2021   K 3.5 11/30/2021   CL 104 11/30/2021   CALCIUM 8.9 11/30/2021   MG 1.9 11/27/2021   PHOS 4.3 11/27/2021    Bone Lab Results  Component Value Date   TESTOSTERONE 473 02/02/2021    Inflammation (CRP: Acute Phase) (ESR: Chronic Phase) Lab Results  Component Value Date   LATICACIDVEN 2.7 (HH) 11/29/2021         Note: Above Lab results reviewed.   Assessment  The primary encounter diagnosis was Lumbar spondylosis. Diagnoses of Lumbar facet arthropathy, Failed back surgical syndrome, and Chronic pain syndrome were also pertinent to this visit.  Plan of Care  Repeat therapeutic/palliative lumbar facet medial branch nerve blocks. We have discussed spinal cord stimulation in the past for  chronic lumbar radicular pain.  Patient will continue to think about this and let us know if he would like to proceed.  Orders:  Orders Placed This Encounter  Procedures   LUMBAR FACET(MEDIAL BRANCH NERVE BLOCK) MBNB    Diagnosis: Lumbar Facet Syndrome (M47.816); Lumbosacral Facet Syndrome (M47.817); Lumbar Facet Joint Pain (M54.59) Medical Necessity Statement: 1.Severe chronic axial low back pain causing functional impairment documented by ongoing pain scale assessments. 2.Pain present for longer than 3 months (Chronic) documented to have failed noninvasive conservative therapies. 3.Absence of untreated radiculopathy. 4.There is no radiological evidence of untreated fractures, tumor, infection, or deformity.  Physical Examination Findings: Positive Kemp Maneuver: (Y)  Positive Lumbar Hyperextension-Rotation provocative test: (Y)    Standing Status:   Future    Expiration Date:   08/01/2023    Scheduling Instructions:     Procedure: Lumbar facet Block     Type: Medial Branch Block     Side: Bilateral     Purpose: Diagnostic Radiologic Mapping     Level(s): L3-4, L4-5, and TBD by Fluoroscopic Mapping Facets (L3, L4, L5  Medial Branch)     Sedation: Patient's choice.     Timeframe: As soon as schedule allows.    Where will this procedure be performed?:   [facility]   Follow-up plan:   Return in about 4 weeks (around 05/30/2023) for B/L L3, 4, 5 MBNB , in clinic NS.      Status post bilateral L3, L4, L5, S1 lumbar facet medial branch nerve blocks on 03/20/2018 helped 85% pain relief for approximately 1 year. Status post  left L3, L4, L5 RFA on 07/11/2019, Right L3, L4, L5 RFA 07/25/19. Not as helpful as lumbar facet MBBs- repeat PRN, consider SPRINT PNS                Recent Visits Date Type Provider Dept  02/21/23 Procedure visit Edward Jolly, MD [facility]  02/07/23 Office Visit Edward Jolly, MD [facility]  Showing recent visits within past 90 days and  meeting all other requirements Today's Visits Date Type Provider Dept  05/02/23 Office Visit Edward Jolly, MD [facility]  Showing today's visits and meeting all other requirements Future Appointments No visits were found meeting these conditions. Showing future appointments within next 90 days and meeting all other requirements  I discussed the assessment and treatment plan with the patient. The patient was provided an opportunity to ask questions and all were answered. The patient agreed with the plan and demonstrated an understanding of the instructions.  Patient advised to call back or seek an in-person evaluation if the symptoms or condition worsens.  Duration of encounter: 15 minutes.  Note by: Edward Jolly, MD Date: 05/02/2023; Time: 4:27 PM

## 2023-05-02 NOTE — Patient Instructions (Signed)

## 2023-05-25 ENCOUNTER — Telehealth: Payer: Self-pay

## 2023-05-25 NOTE — Telephone Encounter (Signed)
 Copied from CRM [id number]. Topic: General - Other >> May 25, 2023  3:56 PM Emylou G wrote: Reason for CRM: Patient called.Aaron Aas looking for script for sinus infection?  Number on file.Aaron Aas

## 2023-05-26 ENCOUNTER — Encounter: Payer: Self-pay | Admitting: Family Medicine

## 2023-05-26 ENCOUNTER — Ambulatory Visit: Admitting: Family Medicine

## 2023-05-26 VITALS — BP 142/84 | HR 73 | Ht 67.5 in | Wt 169.2 lb

## 2023-05-26 DIAGNOSIS — M545 Low back pain, unspecified: Secondary | ICD-10-CM

## 2023-05-26 DIAGNOSIS — M5416 Radiculopathy, lumbar region: Secondary | ICD-10-CM

## 2023-05-26 DIAGNOSIS — J0110 Acute frontal sinusitis, unspecified: Secondary | ICD-10-CM

## 2023-05-26 DIAGNOSIS — M5137 Other intervertebral disc degeneration, lumbosacral region with discogenic back pain only: Secondary | ICD-10-CM

## 2023-05-26 DIAGNOSIS — G8929 Other chronic pain: Secondary | ICD-10-CM | POA: Diagnosis not present

## 2023-05-26 MED ORDER — FLUTICASONE PROPIONATE 50 MCG/ACT NA SUSP: 2.0000 | Freq: Every day | NASAL | 3 refills | Status: AC

## 2023-05-26 MED ORDER — OXYCODONE-ACETAMINOPHEN 7.5-325 MG PO TABS
1.0000 | ORAL_TABLET | ORAL | 0 refills | Status: DC | PRN
Start: 2023-05-26 — End: 2023-07-01

## 2023-05-26 MED ORDER — AMOXICILLIN-POT CLAVULANATE 875-125 MG PO TABS: 1.0000 | ORAL_TABLET | Freq: Two times a day (BID) | ORAL | 0 refills | Status: DC

## 2023-05-26 NOTE — Progress Notes (Signed)
 Subjective:    Patient ID: Ryan [name], male    DOB: Sep 22, 1955, 68 y.o.   MRN: [id number]  Ryan [name] is a 68 y.o. male presenting on 05/26/2023 for Sinusitis   HPI  Discussed the use of AI scribe software for clinical note transcription with the patient, who gave verbal consent to proceed.  History of Present Illness   Ryan [name] is a 67 year old male who presents with sinus congestion and pressure.  He has been experiencing sinus congestion and pressure for the past five to six days, primarily around his eyes. Over-the-counter medications have only partially alleviated his symptoms, and he has not found any treatment that completely resolves his sinus issues. He attributes the onset of his symptoms to high pollen levels this year.  He has a productive cough with significant green sputum production. Worsening symptoms now.  His current medications include Flonase  nasal spray for sinus relief and Breztri  and albuterol  inhalers for respiratory support. He prefers the albuterol  inhaler for its effectiveness as a rescue inhaler.     He is followed by pain management at [facility] for back pain. We have managed his medication here and he is due for refill.       04/14/2023   11:18 AM 02/21/2023    9:50 AM 02/07/2023    2:03 PM  Depression screen PHQ 2/9  Decreased Interest 1 0 0  Down, Depressed, Hopeless 1 0 0  PHQ - 2 Score 2 0 0  Altered sleeping 2    Tired, decreased energy 1    Change in appetite 1    Feeling bad or failure about yourself  0    Trouble concentrating 2    Moving slowly or fidgety/restless 0    Suicidal thoughts 0    PHQ-9 Score 8    Difficult doing work/chores Somewhat difficult         04/14/2023   11:19 AM 11/18/2021    2:18 PM 10/23/2019   11:03 AM  GAD 7 : Generalized Anxiety Score  Nervous, Anxious, on Edge 2 0 0  Control/stop worrying 2 1 1   Worry too much - different things 3 0 0  Trouble relaxing 2 0 0  Restless 0 0 0  Easily annoyed or  irritable 2 1 0  Afraid - awful might happen 3 1 1   Total GAD 7 Score 14 3 2   Anxiety Difficulty Somewhat difficult Somewhat difficult Not difficult at all    Social History   Tobacco Use   Smoking status: Every Day    Current packs/day: 1.50    Average packs/day: 1.5 packs/day for 40.0 years (60.0 ttl pk-yrs)    Types: Cigarettes   Smokeless tobacco: Current   Tobacco comments:    has cut back to 1PPD  Vaping Use   Vaping status: Never Used  Substance Use Topics   Alcohol use: Yes    Alcohol/week: 0.0 standard drinks of alcohol    Comment: ONCE OR TWICE A YR   Drug use: Yes    Types: Marijuana, "Crack" cocaine    Review of Systems Per HPI unless specifically indicated above     Objective:    BP (!) 142/84 (BP Location: Left Arm, Patient Position: Sitting, Cuff Size: Normal)   Pulse 73   Ht 5' 7.5" (1.715 m)   Wt 169 lb 4 oz (76.8 kg)   SpO2 97%   BMI 26.12 kg/m   Wt Readings from Last 3 Encounters:  05/26/23  169 lb 4 oz (76.8 kg)  04/14/23 172 lb (78 kg)  02/21/23 165 lb (74.8 kg)    Physical Exam Vitals and nursing note reviewed.  Constitutional:      General: He is not in acute distress.    Appearance: Normal appearance. He is well-developed. He is not diaphoretic.     Comments: Well-appearing, comfortable, cooperative  HENT:     Head: Normocephalic and atraumatic.     Nose: Congestion present.  Eyes:     General:        Right eye: No discharge.        Left eye: No discharge.     Conjunctiva/sclera: Conjunctivae normal.  Cardiovascular:     Rate and Rhythm: Normal rate.  Pulmonary:     Effort: Pulmonary effort is normal. No respiratory distress.     Breath sounds: No wheezing, rhonchi or rales.     Comments: Reduced air movement but no focal abnormality Skin:    General: Skin is warm and dry.     Findings: No erythema or rash.  Neurological:     Mental Status: He is alert and oriented to person, place, and time.  Psychiatric:        Mood and  Affect: Mood normal.        Behavior: Behavior normal.        Thought Content: Thought content normal.     Comments: Well groomed, good eye contact, normal speech and thoughts     Results for orders placed or performed in visit on 01/14/23  POCT Urinalysis Dipstick   Collection Time: 01/14/23 11:27 AM  Result Value Ref Range   Color, UA yellow    Clarity, UA clear    Glucose, UA Negative Negative   Bilirubin, UA negative    Ketones, UA negative    Spec Grav, UA 1.015 1.010 - 1.025   Blood, UA negative    pH, UA 5.0 5.0 - 8.0   Protein, UA Negative Negative   Urobilinogen, UA 0.2 0.2 or 1.0 E.U./dL   Nitrite, UA negative    Leukocytes, UA Negative Negative   Appearance     Odor smells   Urine Culture   Collection Time: 01/14/23 11:52 AM  Result Value Ref Range   MICRO NUMBER: [id number]    SPECIMEN QUALITY: Adequate    Sample Source URINE    STATUS: FINAL    Result: No Growth   GC/Chlamydia probe amp ([place])not at [facility]   Collection Time: 01/14/23 12:02 PM  Result Value Ref Range   Neisseria Gonorrhea Negative    Chlamydia Negative    Comment Normal Reference Ranger Chlamydia - Negative    Comment      Normal Reference Range Neisseria Gonorrhea - Negative      Assessment & Plan:   Problem List Items Addressed This Visit     Chronic low back pain   Relevant Medications   oxyCODONE -acetaminophen  (PERCOCET) 7.5-325 MG tablet   DDD (degenerative disc disease), lumbosacral   Relevant Medications   oxyCODONE -acetaminophen  (PERCOCET) 7.5-325 MG tablet   Lumbar radiculopathy   Relevant Medications   oxyCODONE -acetaminophen  (PERCOCET) 7.5-325 MG tablet   Sinusitis - Primary   Relevant Medications   amoxicillin -clavulanate (AUGMENTIN ) 875-125 MG tablet   fluticasone  (FLONASE ) 50 MCG/ACT nasal spray     Acute sinusitis Acute sinusitis with sinus pressure and green mucus, likely due to pollen. No fever or ear infection. Rebound congestion from nasal sprays noted  afrin - Prescribed Augmentin  for sinusitis. -  Prescribed Flonase  for congestion.  Chronic obstructive pulmonary disease (COPD) COPD management discussed. Albuterol  preferred for immediate relief, Breztri  for maintenance. - Ensure albuterol  inhaler availability. - Ensure Breztri  inhaler availability.  Chronic Back Pain Managed on opioid therapy Continue therapy    No orders of the defined types were placed in this encounter.   Meds ordered this encounter  Medications   amoxicillin -clavulanate (AUGMENTIN ) 875-125 MG tablet    Sig: Take 1 tablet by mouth 2 (two) times daily.    Dispense:  20 tablet    Refill:  0   fluticasone  (FLONASE ) 50 MCG/ACT nasal spray    Sig: Place 2 sprays into both nostrils daily. Use for 4-6 weeks then stop and use seasonally or as needed.    Dispense:  16 g    Refill:  3   oxyCODONE -acetaminophen  (PERCOCET) 7.5-325 MG tablet    Sig: Take 1 tablet by mouth every 4 (four) hours as needed for severe pain (pain score 7-10).    Dispense:  30 tablet    Refill:  0    Follow up plan: Return if symptoms worsen or fail to improve.  Domingo Friend, DO [facility] 05/26/2023, 10:04 AM

## 2023-05-26 NOTE — Patient Instructions (Addendum)

## 2023-05-30 ENCOUNTER — Ambulatory Visit: Admitting: Student in an Organized Health Care Education/Training Program

## 2023-06-01 ENCOUNTER — Ambulatory Visit (INDEPENDENT_AMBULATORY_CARE_PROVIDER_SITE_OTHER): Admitting: Urology

## 2023-06-01 ENCOUNTER — Encounter: Payer: Self-pay | Admitting: Urology

## 2023-06-01 VITALS — BP 141/86 | HR 68 | Ht 67.0 in | Wt 170.0 lb

## 2023-06-01 DIAGNOSIS — N529 Male erectile dysfunction, unspecified: Secondary | ICD-10-CM | POA: Diagnosis not present

## 2023-06-01 DIAGNOSIS — N5201 Erectile dysfunction due to arterial insufficiency: Secondary | ICD-10-CM

## 2023-06-01 DIAGNOSIS — N401 Enlarged prostate with lower urinary tract symptoms: Secondary | ICD-10-CM

## 2023-06-01 DIAGNOSIS — Z125 Encounter for screening for malignant neoplasm of prostate: Secondary | ICD-10-CM

## 2023-06-01 LAB — MICROSCOPIC EXAMINATION: Bacteria, UA: NONE SEEN

## 2023-06-01 LAB — URINALYSIS, COMPLETE
Bilirubin, UA: NEGATIVE
Glucose, UA: NEGATIVE
Ketones, UA: NEGATIVE
Leukocytes,UA: NEGATIVE
Nitrite, UA: NEGATIVE
Protein,UA: NEGATIVE
RBC, UA: NEGATIVE
Specific Gravity, UA: 1.025 (ref 1.005–1.030)
Urobilinogen, Ur: 0.2 mg/dL (ref 0.2–1.0)
pH, UA: 6 (ref 5.0–7.5)

## 2023-06-01 LAB — BLADDER SCAN AMB NON-IMAGING

## 2023-06-01 MED ORDER — SILDENAFIL CITRATE 20 MG PO TABS: 80.0000 mg | ORAL_TABLET | ORAL | 5 refills | Status: AC | PRN

## 2023-06-01 NOTE — Progress Notes (Signed)
 I, Ryan [name], acting as a scribe for Ryan Knapp, MD., have documented all relevant documentation on the behalf of Ryan Knapp, MD, as directed by Ryan Knapp, MD while in the presence of Ryan Knapp, MD.  06/01/2023 4:17 PM   Ryan [name] [birth year] [id number]  Referring provider: Raina Bunting, DO [address] [place],  [place] [address]   HPI: Ryan [name] is a 68 y.o. male presents to transfer urologic care.   Has been followed by [facility] since 2014 for erectile dysfunction and BPH with lower urinary tract symptoms Currently on tamsulosin  0.4 mg twice daily, and generic Sildenafil  20 mg tabs, 80-100 mg prior to to intercourse.  These medications have been effective. He has no current complaints today, and the purpose of the visit is to establish care as he is no longer able to travel to [facility].   PMH: Past Medical History:  Diagnosis Date   Anxiety    Arthritis    left hand   Asthma    Chronic kidney disease    HAS HAD KIDNEY STONE 2015-- JUST PASSED   COPD (chronic obstructive pulmonary disease) (HCC)    Dyspnea    GERD (gastroesophageal reflux disease)    TAKES TUMS FOR RELIEF   Hyperlipidemia    Hypertension    Improved, No longer on meds   Lower urinary tract symptoms (LUTS)    Prostate disease    Stroke (HCC)    Thyroid  disease    Wears dentures    hass full upper plate, not wearing, broken   Wears hearing aid     Surgical History: Past Surgical History:  Procedure Laterality Date   BACK SURGERY     CATARACT EXTRACTION W/PHACO Left 01/06/2016   Procedure: CATARACT EXTRACTION PHACO AND INTRAOCULAR LENS PLACEMENT (IOC);  Surgeon: Rosa College, MD;  Location[facility];  Service: Ophthalmology;  Laterality: Left;  LEFT   COLONOSCOPY WITH PROPOFOL  N/A 04/19/2016   Procedure: COLONOSCOPY WITH PROPOFOL ;  Surgeon: Cassie Click, MD;  Location: [facility] ENDOSCOPY;  Service: Endoscopy;  Laterality: N/A;    ESOPHAGOGASTRODUODENOSCOPY (EGD) WITH PROPOFOL  N/A 10/24/2015   Procedure: ESOPHAGOGASTRODUODENOSCOPY (EGD) WITH PROPOFOL ;  Surgeon: Cassie Click, MD;  Location: [facility] ENDOSCOPY;  Service: Endoscopy;  Laterality: N/A;   EYE SURGERY     HERNIA REPAIR     ERRONEOUS UMBILICAL HERNIA   2011   MYRINGOTOMY WITH TUBE PLACEMENT Bilateral 03/28/2015   Procedure: MYRINGOTOMY WITH TUBE PLACEMENT;  Surgeon: Lesly Raspberry, MD;  Location: [facility];  Service: ENT;  Laterality: Bilateral;  BUTTERFLY TUBE   SINUSOTOMY  02/28/2019   TONSILLECTOMY     TRANSFORAMINAL LUMBAR INTERBODY FUSION (TLIF) WITH PEDICLE SCREW FIXATION 1 LEVEL Right 06/18/2014   Procedure: TRANSFORAMINAL LUMBAR INTERBODY FUSION (TLIF) WITH PEDICLE SCREW FIXATION 1 LEVEL LUMBAR 5 -SACRAL 1;  Surgeon: Tivis Forster, MD;  Location: [facility]O [facility];  Service: Neurosurgery;  Laterality: Right;  Right transforaminal lumbar interbody fusion with interbody prosthesis and percutaneous pedicle screws Lumbar 5 to Sacral 1    Home Medications:  Allergies as of 06/01/2023       Reactions   Sertraline Other (See Comments)        Medication List        Accurate as of Jun 01, 2023  4:17 PM. If you have any questions, ask your nurse or doctor.          albuterol  (2.5 MG/3ML) 0.083% nebulizer  solution Commonly known as: PROVENTIL  Take 3 mLs (2.5 mg total) by nebulization every 6 (six) hours as needed for wheezing or shortness of breath.   albuterol  108 (90 Base) MCG/ACT inhaler Commonly known as: VENTOLIN  HFA Inhale 2 puffs into the lungs every 6 (six) hours as needed for wheezing or shortness of breath.   ALPRAZolam  1 MG tablet Commonly known as: XANAX  Take 1 tablet (1 mg total) by mouth 2 (two) times daily as needed.   amoxicillin -clavulanate 875-125 MG tablet Commonly known as: AUGMENTIN  Take 1 tablet by mouth 2 (two) times daily.   atorvastatin  80 MG tablet Commonly known as: LIPITOR  TAKE 1 TABLET BY MOUTH AT BEDTIME    Breztri  Aerosphere 160-9-4.8 MCG/ACT Aero inhaler Generic drug: budeson-glycopyrrolate -formoterol Inhale 2 puffs into the lungs in the morning and at bedtime. Rinse your mouth with water and spit after using your inhaler to prevent thrush.   fluticasone  50 MCG/ACT nasal spray Commonly known as: FLONASE  Place 2 sprays into both nostrils daily. Use for 4-6 weeks then stop and use seasonally or as needed.   gabapentin  400 MG capsule Commonly known as: NEURONTIN  Take 400 mg by mouth 3 (three) times daily. Per psychiatry   lisinopril  20 MG tablet Commonly known as: ZESTRIL  Take 1 tablet (20 mg total) by mouth daily.   oxyCODONE -acetaminophen  7.5-325 MG tablet Commonly known as: PERCOCET Take 1 tablet by mouth every 4 (four) hours as needed for severe pain (pain score 7-10).   sildenafil  20 MG tablet Commonly known as: REVATIO  Take 4-5 tablets (80-100 mg total) by mouth as needed (ED).   tamsulosin  0.4 MG Caps capsule Commonly known as: FLOMAX  Take 1 capsule (0.4 mg total) by mouth in the morning and at bedtime.   tiZANidine  4 MG tablet Commonly known as: ZANAFLEX  Take 1 tablet (4 mg total) by mouth every 8 (eight) hours as needed for muscle spasms.        Allergies:  Allergies  Allergen Reactions   Sertraline Other (See Comments)    Family History: Family History  Problem Relation Age of Onset   Arthritis Mother    Hypertension Mother    Diabetes Father    Hypertension Father     Social History:  reports that he has been smoking cigarettes. He has a 60 pack-year smoking history. He uses smokeless tobacco. He reports current alcohol use. He reports current drug use. Drugs: Marijuana and "Crack" cocaine.   Physical Exam: BP (!) 141/86   Pulse 68   Ht 5\' 7"  (1.702 m)   Wt 170 lb (77.1 kg)   BMI 26.63 kg/m   Constitutional:  Alert and oriented, No acute distress. HEENT: [place] AT, moist mucus membranes.  Trachea midline, no masses. Cardiovascular: No clubbing,  cyanosis, or edema. Respiratory: Normal respiratory effort, no increased work of breathing. GI: Abdomen is soft, nontender, nondistended, no abdominal masses Skin: No rashes, bruises or suspicious lesions. Neurologic: Grossly intact, no focal deficits, moving all 4 extremities. Psychiatric: Normal mood and affect.   Assessment & Plan:    1. BPH with LUTS Doing well on tamsulosin ; did not need a refill at this time.   2. Erectile dysfunction Stable on sildenafil  and refill sent to pharmacy.   3. Prostate cancer screening On chart review, after patient left, his last PSA was November 2023 and was 1.25. He will be contacted to schedule a PSA.  [facility] [address], [address] [place], [place] [address] [phone]

## 2023-06-02 ENCOUNTER — Other Ambulatory Visit: Payer: Self-pay | Admitting: *Deleted

## 2023-06-02 ENCOUNTER — Encounter: Payer: Self-pay | Admitting: Urology

## 2023-06-02 DIAGNOSIS — Z125 Encounter for screening for malignant neoplasm of prostate: Secondary | ICD-10-CM

## 2023-06-03 ENCOUNTER — Other Ambulatory Visit

## 2023-06-03 DIAGNOSIS — Z125 Encounter for screening for malignant neoplasm of prostate: Secondary | ICD-10-CM

## 2023-06-04 LAB — PSA: Prostate Specific Ag, Serum: 1.2 ng/mL (ref 0.0–4.0)

## 2023-06-15 ENCOUNTER — Other Ambulatory Visit: Payer: Self-pay

## 2023-06-15 DIAGNOSIS — E782 Mixed hyperlipidemia: Secondary | ICD-10-CM

## 2023-06-15 MED ORDER — ATORVASTATIN CALCIUM 80 MG PO TABS: 80.0000 mg | ORAL_TABLET | Freq: Every day | ORAL | 1 refills | Status: DC

## 2023-06-21 ENCOUNTER — Telehealth: Payer: Self-pay | Admitting: Family Medicine

## 2023-06-21 DIAGNOSIS — E782 Mixed hyperlipidemia: Secondary | ICD-10-CM

## 2023-06-21 NOTE — Telephone Encounter (Unsigned)
 Copied from CRM [id number]. Topic: Clinical - Medication Refill >> Jun 21, 2023 12:05 PM Lynnie Saucier S wrote: Medication: atorvastatin  (LIPITOR ) 80 MG tablet  Has the patient contacted their pharmacy? Yes (Agent: If no, request that the patient contact the pharmacy for the refill. If patient does not wish to contact the pharmacy document the reason why and proceed with request.) (Agent: If yes, when and what did the pharmacy advise?)  This is the patient's preferred pharmacy:  [facility] [address] ([address]),  - [address] [place]) [place] [address] Phone: [phone] Fax: [phone]  Is this the correct pharmacy for this prescription? Yes If no, delete pharmacy and type the correct one.   Has the prescription been filled recently? No  Is the patient out of the medication? Yes  Has the patient been seen for an appointment in the last year OR does the patient have an upcoming appointment? Yes  Can we respond through MyChart? No  Agent: Please be advised that Rx refills may take up to 3 business days. We ask that you follow-up with your pharmacy.

## 2023-06-21 NOTE — Telephone Encounter (Unsigned)
 Copied from CRM [id number]. Topic: Clinical - Medication Refill >> Jun 21, 2023 12:07 PM Ryan [name] S wrote: Medication: atorvastatin  (LIPITOR ) 80 MG tablet  Has the patient contacted their pharmacy? Yes (Agent: If no, request that the patient contact the pharmacy for the refill. If patient does not wish to contact the pharmacy document the reason why and proceed with request.) (Agent: If yes, when and what did the pharmacy advise?)  This is the patient's preferred pharmacy:  [facility] [address] ([address]), [place] - [address] [name]) [place] [address] Phone: [phone] Fax: [phone]  Is this the correct pharmacy for this prescription? Yes If no, delete pharmacy and type the correct one.   Has the prescription been filled recently? No  Is the patient out of the medication? Yes  Has the patient been seen for an appointment in the last year OR does the patient have an upcoming appointment? Yes  Can we respond through MyChart? No  Agent: Please be advised that Rx refills may take up to 3 business days. We ask that you follow-up with your pharmacy.

## 2023-06-23 NOTE — Telephone Encounter (Signed)
 Requested Prescriptions  Refused Prescriptions Disp Refills   atorvastatin  (LIPITOR ) 80 MG tablet 90 tablet 1    Sig: Take 1 tablet (80 mg total) by mouth at bedtime.     Cardiovascular:  Antilipid - Statins Failed - 06/23/2023  5:05 PM      Failed - Valid encounter within last 12 months    Recent Outpatient Visits           4 weeks ago Acute non-recurrent frontal sinusitis   [facility] [place], Kayleen Party, DO   2 months ago Degeneration of intervertebral disc of lumbosacral region with discogenic back pain   [facility] Raina Bunting, DO       Future Appointments             In 11 months [name], Kizzie Perks, MD [facility] [place]            Failed - Lipid Panel in normal range within the last 12 months    Cholesterol  Date Value Ref Range Status  11/30/2021 84 0 - 200 mg/dL Final   LDL Cholesterol (Calc)  Date Value Ref Range Status  11/18/2021 36 mg/dL (calc) Final    Comment:    Reference range: <100 . Desirable range <100 mg/dL for primary prevention;   <70 mg/dL for patients with CHD or diabetic patients  with > or = 2 CHD risk factors. Aaron Aas LDL-C is now calculated using the Martin-Hopkins  calculation, which is a validated novel method providing  better accuracy than the Friedewald equation in the  estimation of LDL-C.  Melinda Sprawls et al. Erroll Heard. 4098;119(14): 2061-2068  (http://education.[employer].com/faq/FAQ164)    LDL Cholesterol  Date Value Ref Range Status  11/30/2021 39 0 - 99 mg/dL Final    Comment:           Total Cholesterol/HDL:CHD Risk Coronary Heart Disease Risk Table                     Men   Women  1/2 Average Risk   3.4   3.3  Average Risk       5.0   4.4  2 X Average Risk   9.6   7.1  3 X Average Risk  23.4   11.0        Use the calculated Patient Ratio above and the CHD Risk Table to determine the patient's CHD Risk.        ATP III CLASSIFICATION  (LDL):  <100     mg/dL   Optimal  782-956  mg/dL   Near or Above                    Optimal  130-159  mg/dL   Borderline  213-086  mg/dL   High  >578     mg/dL   Very High Performed at [facility], [address] [place], [place] [address]    HDL  Date Value Ref Range Status  11/30/2021 40 (L) >40 mg/dL Final   Triglycerides  Date Value Ref Range Status  11/30/2021 26 <150 mg/dL Final         Passed - Patient is not pregnant

## 2023-07-01 ENCOUNTER — Other Ambulatory Visit: Payer: Self-pay | Admitting: Family Medicine

## 2023-07-01 ENCOUNTER — Ambulatory Visit: Payer: 59

## 2023-07-01 DIAGNOSIS — M5137 Other intervertebral disc degeneration, lumbosacral region with discogenic back pain only: Secondary | ICD-10-CM

## 2023-07-01 DIAGNOSIS — Z0000 Encounter for general adult medical examination without abnormal findings: Secondary | ICD-10-CM

## 2023-07-01 DIAGNOSIS — Z122 Encounter for screening for malignant neoplasm of respiratory organs: Secondary | ICD-10-CM

## 2023-07-01 DIAGNOSIS — M5416 Radiculopathy, lumbar region: Secondary | ICD-10-CM

## 2023-07-01 DIAGNOSIS — M545 Other chronic pain: Secondary | ICD-10-CM

## 2023-07-01 MED ORDER — OXYCODONE-ACETAMINOPHEN 7.5-325 MG PO TABS: 1.0000 | ORAL_TABLET | ORAL | 0 refills | Status: DC | PRN

## 2023-07-01 NOTE — Patient Instructions (Addendum)
 Mr. Ryan [name] , Thank you for taking time out of your busy schedule to complete your Annual Wellness Visit with me. I enjoyed our conversation and look forward to speaking with you again next year. I, as well as your care team,  appreciate your ongoing commitment to your health goals. Please review the following plan we discussed and let me know if I can assist you in the future.   Follow up Visits: Next Medicare AWV with our clinical staff:   07/06/24 @ 3:20 PM BY PHONE Have you seen your provider in the last 6 months (3 months if uncontrolled diabetes)? Yes   Clinician Recommendations:  Aim for 30 minutes of exercise or brisk walking, 6-8 glasses of water, and 5 servings of fruits and vegetables each day. TAKE CARE!      This is a list of the screening recommended for you and due dates:  Health Maintenance  Topic Date Due   Zoster (Shingles) Vaccine (1 of 2) Never done   COVID-19 Vaccine (4 - 2024-25 season) 09/26/2022   Screening for Lung Cancer  11/30/2022   Flu Shot  08/26/2023   Medicare Annual Wellness Visit  06/30/2024   Colon Cancer Screening  04/20/2026   DTaP/Tdap/Td vaccine (2 - Td or Tdap) 04/26/2027   Pneumonia Vaccine  Completed   Hepatitis C Screening  Completed   HPV Vaccine  Aged Out   Meningitis B Vaccine  Aged Out    Advanced directives: (ACP Link)Information on Advanced Care Planning can be found at [place]  Print production planner Health Care Directives Advance Health Care Directives. [email or web address]  Advance Care Planning is important because it:  [x]  Makes sure you receive the medical care that is consistent with your values, goals, and preferences  [x]  It provides guidance to your family and loved ones and reduces their decisional burden about whether or not they are making the right decisions based on your wishes.  Follow the link provided in your after visit summary or read over the paperwork we have mailed to you to help you started getting your Advance  Directives in place. If you need assistance in completing these, please reach out to us  so that we can help you!

## 2023-07-01 NOTE — Progress Notes (Signed)
 Subjective:   Ryan [name] is a 68 y.o. who presents for a Medicare Wellness preventive visit.  As a reminder, Annual Wellness Visits don't include a physical exam, and some assessments may be limited, especially if this visit is performed virtually. We may recommend an in-person follow-up visit with your provider if needed.  Visit Complete: Virtual I connected with  Ryan [name] on 07/01/23 by a audio enabled telemedicine application and verified that I am speaking with the correct person using two identifiers.  Patient Location: Home  Provider Location: Home Office  I discussed the limitations of evaluation and management by telemedicine. The patient expressed understanding and agreed to proceed.  Vital Signs: Because this visit was a virtual/telehealth visit, some criteria may be missing or patient reported. Any vitals not documented were not able to be obtained and vitals that have been documented are patient reported.  VideoDeclined- This patient declined Librarian, academic. Therefore the visit was completed with audio only.  Persons Participating in Visit: Patient.  AWV Questionnaire: No: Patient Medicare AWV questionnaire was not completed prior to this visit.  Cardiac Risk Factors include: advanced age (>56men, >62 women);hypertension;dyslipidemia;male gender;smoking/ tobacco exposure     Objective:     Today's Vitals   07/01/23 1320  PainSc: 4    There is no height or weight on file to calculate BMI.     07/01/2023    1:27 PM 02/21/2023    9:50 AM 02/07/2023    2:04 PM 01/03/2023    8:10 AM 11/02/2022    2:10 PM 10/13/2022    8:02 AM 09/13/2022    8:35 AM  Advanced Directives  Does Patient Have a Medical Advance Directive? No No No No No No No  Would patient like information on creating a medical advance directive? No - Patient declined No - Patient declined No - Patient declined No - Patient declined  No - Patient declined     Current  Medications (verified) Outpatient Encounter Medications as of 07/01/2023  Medication Sig   albuterol  (PROVENTIL ) (2.5 MG/3ML) 0.083% nebulizer solution Take 3 mLs (2.5 mg total) by nebulization every 6 (six) hours as needed for wheezing or shortness of breath.   albuterol  (VENTOLIN  HFA) 108 (90 Base) MCG/ACT inhaler Inhale 2 puffs into the lungs every 6 (six) hours as needed for wheezing or shortness of breath.   ALPRAZolam  (XANAX ) 1 MG tablet Take 1 tablet (1 mg total) by mouth 2 (two) times daily as needed.   atorvastatin  (LIPITOR ) 80 MG tablet Take 1 tablet (80 mg total) by mouth at bedtime.   BREZTRI  AEROSPHERE 160-9-4.8 MCG/ACT AERO Inhale 2 puffs into the lungs in the morning and at bedtime. Rinse your mouth with water and spit after using your inhaler to prevent thrush.   gabapentin  (NEURONTIN ) 400 MG capsule Take 400 mg by mouth 3 (three) times daily. Per psychiatry   lisinopril  (ZESTRIL ) 20 MG tablet Take 1 tablet (20 mg total) by mouth daily.   sildenafil  (REVATIO ) 20 MG tablet Take 4-5 tablets (80-100 mg total) by mouth as needed (ED).   tamsulosin  (FLOMAX ) 0.4 MG CAPS capsule Take 1 capsule (0.4 mg total) by mouth in the morning and at bedtime.   tiZANidine  (ZANAFLEX ) 4 MG tablet Take 1 tablet (4 mg total) by mouth every 8 (eight) hours as needed for muscle spasms.   amoxicillin -clavulanate (AUGMENTIN ) 875-125 MG tablet Take 1 tablet by mouth 2 (two) times daily. (Patient not taking: Reported on 07/01/2023)  fluticasone  (FLONASE ) 50 MCG/ACT nasal spray Place 2 sprays into both nostrils daily. Use for 4-6 weeks then stop and use seasonally or as needed. (Patient not taking: Reported on 07/01/2023)   oxyCODONE -acetaminophen  (PERCOCET) 7.5-325 MG tablet Take 1 tablet by mouth every 4 (four) hours as needed for severe pain (pain score 7-10). (Patient not taking: Reported on 07/01/2023)   No facility-administered encounter medications on file as of 07/01/2023.    Allergies (verified) Sertraline    History: Past Medical History:  Diagnosis Date   Anxiety    Arthritis    left hand   Asthma    Chronic kidney disease    HAS HAD KIDNEY STONE 2015-- JUST PASSED   COPD (chronic obstructive pulmonary disease) (HCC)    Dyspnea    GERD (gastroesophageal reflux disease)    TAKES TUMS FOR RELIEF   Hyperlipidemia    Hypertension    Improved, No longer on meds   Lower urinary tract symptoms (LUTS)    Prostate disease    Stroke (HCC)    Thyroid  disease    Wears dentures    hass full upper plate, not wearing, broken   Wears hearing aid    Past Surgical History:  Procedure Laterality Date   BACK SURGERY     CATARACT EXTRACTION W/PHACO Left 01/06/2016   Procedure: CATARACT EXTRACTION PHACO AND INTRAOCULAR LENS PLACEMENT (IOC);  Surgeon: Rosa College, MD;  Location: [facility];  Service: Ophthalmology;  Laterality: Left;  LEFT   COLONOSCOPY WITH PROPOFOL  N/A 04/19/2016   Procedure: COLONOSCOPY WITH PROPOFOL ;  Surgeon: Cassie Click, MD;  Location: [facility] ENDOSCOPY;  Service: Endoscopy;  Laterality: N/A;   ESOPHAGOGASTRODUODENOSCOPY (EGD) WITH PROPOFOL  N/A 10/24/2015   Procedure: ESOPHAGOGASTRODUODENOSCOPY (EGD) WITH PROPOFOL ;  Surgeon: Cassie Click, MD;  Location: [facility] ENDOSCOPY;  Service: Endoscopy;  Laterality: N/A;   EYE SURGERY     HERNIA REPAIR     ERRONEOUS UMBILICAL HERNIA   2011   MYRINGOTOMY WITH TUBE PLACEMENT Bilateral 03/28/2015   Procedure: MYRINGOTOMY WITH TUBE PLACEMENT;  Surgeon: Lesly Raspberry, MD;  Location: [facility];  Service: ENT;  Laterality: Bilateral;  BUTTERFLY TUBE   SINUSOTOMY  02/28/2019   TONSILLECTOMY     TRANSFORAMINAL LUMBAR INTERBODY FUSION (TLIF) WITH PEDICLE SCREW FIXATION 1 LEVEL Right 06/18/2014   Procedure: TRANSFORAMINAL LUMBAR INTERBODY FUSION (TLIF) WITH PEDICLE SCREW FIXATION 1 LEVEL LUMBAR 5 -SACRAL 1;  Surgeon: Tivis Forster, MD;  Location: [facility]URO [facility]S;  Service: Neurosurgery;  Laterality: Right;  Right  transforaminal lumbar interbody fusion with interbody prosthesis and percutaneous pedicle screws Lumbar 5 to Sacral 1   Family History  Problem Relation Age of Onset   Arthritis Mother    Hypertension Mother    Diabetes Father    Hypertension Father    Social History   Socioeconomic History   Marital status: Married    Spouse name: Not on file   Number of children: Not on file   Years of education: Not on file   Highest education level: Not on file  Occupational History   Not on file  Tobacco Use   Smoking status: Every Day    Current packs/day: 1.50    Average packs/day: 1.5 packs/day for 40.0 years (60.0 ttl pk-yrs)    Types: Cigarettes   Smokeless tobacco: Current   Tobacco comments:    has cut back to 1PPD  Vaping Use   Vaping status: Never Used  Substance and Sexual Activity   Alcohol use: Yes  Alcohol/week: 0.0 standard drinks of alcohol    Comment: ONCE OR TWICE A YR   Drug use: Yes    Types: Marijuana, "Crack" cocaine   Sexual activity: Not on file  Other Topics Concern   Not on file  Social History Narrative   Not on file   Social Drivers of Health   Financial Resource Strain: Low Risk  (07/01/2023)   Overall Financial Resource Strain (CARDIA)    Difficulty of Paying Living Expenses: Not hard at all  Food Insecurity: No Food Insecurity (07/01/2023)   Hunger Vital Sign    Worried About Running Out of Food in the Last Year: Never true    Ran Out of Food in the Last Year: Never true  Transportation Needs: No Transportation Needs (07/01/2023)   PRAPARE - Administrator, Civil Service (Medical): No    Lack of Transportation (Non-Medical): No  Physical Activity: Sufficiently Active (07/01/2023)   Exercise Vital Sign    Days of Exercise per Week: 5 days    Minutes of Exercise per Session: 30 min  Stress: Stress Concern Present (07/01/2023)   [employer] of Occupational Health - Occupational Stress Questionnaire    Feeling of Stress : To some  extent  Social Connections: Socially Isolated (07/01/2023)   Social Connection and Isolation Panel [NHANES]    Frequency of Communication with Friends and Family: Twice a week    Frequency of Social Gatherings with Friends and Family: Never    Attends Religious Services: Never    Diplomatic Services operational officer: No    Attends Engineer, structural: Never    Marital Status: Divorced    Tobacco Counseling Ready to quit: Not Answered Counseling given: Not Answered Tobacco comments: has cut back to 1PPD    Clinical Intake:  Pre-visit preparation completed: Yes  Pain : 0-10 Pain Score: 4  Pain Type: Chronic pain Pain Location: Back Pain Orientation: Lower Pain Descriptors / Indicators: Aching, Discomfort, Constant Pain Onset: More than a month ago Pain Frequency: Constant     BMI - recorded: 26.6 Nutritional Status: BMI 25 -29 Overweight Nutritional Risks: None Diabetes: No  Lab Results  Component Value Date   HGBA1C 5.8 (H) 11/18/2021   HGBA1C 5.6 02/02/2021   HGBA1C 6.0 (H) 09/10/2019     How often do you need to have someone help you when you read instructions, pamphlets, or other written materials from your doctor or pharmacy?: 1 - Never  Interpreter Needed?: No  Information entered by :: Dellie Fergusson, LPN   Activities of Daily Living    07/01/2023    1:28 PM  In your present state of health, do you have any difficulty performing the following activities:  Hearing? 1  Vision? 0  Difficulty concentrating or making decisions? 0  Walking or climbing stairs? 1  Comment LEG PAIN  Dressing or bathing? 0  Doing errands, shopping? 0  Preparing Food and eating ? N  Using the Toilet? N  In the past six months, have you accidently leaked urine? N  Do you have problems with loss of bowel control? N  Managing your Medications? N  Managing your Finances? N  Housekeeping or managing your Housekeeping? N    Patient Care Team: Raina Bunting, DO as PCP - General (Family Medicine) [facility], [facility] (Optometry)  I have updated your Care Teams any recent Medical Services you may have received from other providers in the past year.  Assessment:    This is a routine wellness examination for Jaterrius.  Hearing/Vision screen Hearing Screening - Comments:: WEARS AIDS, BOTH EARS Vision Screening - Comments:: READERS- [facility]   Goals Addressed             This Visit's Progress    DIET - INCREASE WATER INTAKE         Depression Screen     07/01/2023    1:24 PM 04/14/2023   11:18 AM 02/21/2023    9:50 AM 02/07/2023    2:03 PM 11/10/2022   10:16 AM 06/25/2022    1:08 PM 04/21/2022    1:46 PM  PHQ 2/9 Scores  PHQ - 2 Score 2 2 0 0 3 2 0  PHQ- 9 Score 2 8   7 4      Fall Risk     07/01/2023    1:27 PM 04/14/2023   11:18 AM 02/21/2023    9:50 AM 02/07/2023    2:03 PM 01/03/2023    8:10 AM  Fall Risk   Falls in the past year? 0 0 0 0 0  Number falls in past yr: 0  0    Injury with Fall? 0 0 0    Risk for fall due to : No Fall Risks      Follow up Falls evaluation completed        MEDICARE RISK AT HOME:  Medicare Risk at Home Any stairs in or around the home?: No If so, are there any without handrails?: No Home free of loose throw rugs in walkways, pet beds, electrical cords, etc?: Yes Adequate lighting in your home to reduce risk of falls?: Yes Life alert?: No Use of a cane, walker or w/c?: No Grab bars in the bathroom?: Yes Shower chair or bench in shower?: Yes Elevated toilet seat or a handicapped toilet?: No  TIMED UP AND GO:  Was the test performed?  No  Cognitive Function: 6CIT completed        07/01/2023    1:29 PM 06/25/2022    1:15 PM  6CIT Screen  What Year? 0 points 0 points  What month? 0 points 0 points  What time? 0 points 0 points  Count back from 20 0 points 0 points  Months in reverse 4 points 4 points  Repeat phrase 10 points 0 points  Total Score 14 points 4 points     Immunizations Immunization History  Administered Date(s) Administered   Fluad Quad(high Dose 65+) 11/18/2021   Fluad Trivalent(High Dose 65+) 10/05/2022   Influenza,inj,Quad PF,6+ Mos 11/18/2017, 11/06/2018, 10/23/2019   Influenza,inj,quad, With Preservative 11/26/2015, 11/10/2016   Influenza-Unspecified 11/08/2020   Moderna Sars-Covid-2 Vaccination 02/28/2019, 03/28/2019, 04/03/2020   PNEUMOCOCCAL CONJUGATE-20 01/28/2021   Tdap 04/25/2017    Screening Tests Health Maintenance  Topic Date Due   Zoster Vaccines- Shingrix (1 of 2) Never done   COVID-19 Vaccine (4 - 2024-25 season) 09/26/2022   Lung Cancer Screening  11/30/2022   INFLUENZA VACCINE  08/26/2023   Medicare Annual Wellness (AWV)  06/30/2024   Colonoscopy  04/20/2026   DTaP/Tdap/Td (2 - Td or Tdap) 04/26/2027   Pneumonia Vaccine 30+ Years old  Completed   Hepatitis C Screening  Completed   HPV VACCINES  Aged Out   Meningococcal B Vaccine  Aged Out    Health Maintenance  Health Maintenance Due  Topic Date Due   Zoster Vaccines- Shingrix (1 of 2) Never done   COVID-19 Vaccine (4 - 2024-25 season) 09/26/2022  Lung Cancer Screening  11/30/2022   Health Maintenance Items Addressed: Referral sent to [facility] (smoker/hx smoking) UP TO DATE ON PNA, TDAP, NEEDS SHINGRIX & COVID Additional Screening:  Vision Screening: Recommended annual ophthalmology exams for early detection of glaucoma and other disorders of the eye. Would you like a referral to an eye doctor? No    Dental Screening: Recommended annual dental exams for proper oral hygiene  Community Resource Referral / Chronic Care Management: CRR required this visit?  No   CCM required this visit?  No   Plan:    I have personally reviewed and noted the following in the patient's chart:   Medical and social history Use of alcohol, tobacco or illicit drugs  Current medications and supplements including opioid prescriptions. Patient is  currently taking opioid prescriptions. Information provided to patient regarding non-opioid alternatives. Patient advised to discuss non-opioid treatment plan with their provider. Functional ability and status Nutritional status Physical activity Advanced directives List of other physicians Hospitalizations, surgeries, and ER visits in previous 12 months Vitals Screenings to include cognitive, depression, and falls Referrals and appointments  In addition, I have reviewed and discussed with patient certain preventive protocols, quality metrics, and best practice recommendations. A written personalized care plan for preventive services as well as general preventive health recommendations were provided to patient.   Pinky Bright, LPN   01/30/1094   After Visit Summary: (MyChart) Due to this being a telephonic visit, the after visit summary with patients personalized plan was offered to patient via MyChart   Notes: LUNG CA SCREEN REFERRAL SENT

## 2023-07-20 ENCOUNTER — Ambulatory Visit: Admitting: Family Medicine

## 2023-08-01 ENCOUNTER — Ambulatory Visit: Payer: Self-pay | Admitting: *Deleted

## 2023-08-01 NOTE — Telephone Encounter (Signed)
 FYI Only or Action Required?: Action required by provider: request for appointment.  Patient was last seen in primary care on 05/26/2023 by [name], DO.  Called Nurse Triage reporting Insect Bite.  Symptoms began several days ago.  Interventions attempted: Nothing.  Symptoms are: gradually worsening.  Triage Disposition: See Physician Within 24 Hours  Patient/caregiver understands and will follow disposition?: Yes - no open appointment- message sent for provider review  Reason for Disposition . [1] Red or very tender (to touch) area AND [2] getting larger over 48 hours after the sting  Answer Assessment - Initial Assessment Questions 1. TYPE: What type of sting was it? (bee, yellow jacket, etc.)      Unsure-type- patient believes bee sting 2. ONSET: When did it occur?      Friday afternoon 3. LOCATION: Where is the sting located?  How many stings?     Abdomen   4. SWELLING SIZE: How big is the swelling? (e.g., inches or cm)     Half dollar size red area- oblong 5. REDNESS: Is the area red or pink? If Yes, ask: What size is area of redness? (e.g., inches or cm). When did the redness start?     Reddish pink- started and progressed 6. PAIN: Is there any pain? If Yes, ask: How bad is it?  (Scale 1-10; or mild, moderate, severe)     No pain 7. ITCHING: Is there any itching? If Yes, ask: How bad is it?      Yes- severe 8. RESPIRATORY DISTRESS: Describe your breathing.     no 9. PRIOR REACTIONS: Have you had any severe allergic reactions to stings in the past? if yes, ask: What happened?     Yes- same-swelling/redness 10. OTHER SYMPTOMS: Do you have any other symptoms? (e.g., abdomen pain, face or tongue swelling, new rash elsewhere, vomiting)       no  Protocols used: Bee or Yellow Jacket Sting-A-AH   Copied from CRM [id number]. Topic: Clinical - Red Word Triage >> Aug 01, 2023  4:09 PM Ivette P wrote: Red Word that prompted transfer  to Nurse Triage: pt was stung by a bee, and swollen up and red spot around.

## 2023-08-02 NOTE — Telephone Encounter (Signed)
 Tried calling patient no answer or VM  mailbox is full.

## 2023-08-04 ENCOUNTER — Ambulatory Visit: Admitting: Family Medicine

## 2023-08-08 ENCOUNTER — Ambulatory Visit (INDEPENDENT_AMBULATORY_CARE_PROVIDER_SITE_OTHER)

## 2023-08-08 VITALS — BP 118/80 | HR 77 | Ht 67.0 in | Wt 164.0 lb

## 2023-08-08 DIAGNOSIS — E785 Hyperlipidemia, unspecified: Secondary | ICD-10-CM | POA: Diagnosis not present

## 2023-08-08 DIAGNOSIS — Z0000 Encounter for general adult medical examination without abnormal findings: Secondary | ICD-10-CM

## 2023-08-08 DIAGNOSIS — R7309 Other abnormal glucose: Secondary | ICD-10-CM | POA: Diagnosis not present

## 2023-08-08 DIAGNOSIS — Z6827 Body mass index (BMI) 27.0-27.9, adult: Secondary | ICD-10-CM

## 2023-08-08 DIAGNOSIS — N529 Male erectile dysfunction, unspecified: Secondary | ICD-10-CM | POA: Diagnosis not present

## 2023-08-08 DIAGNOSIS — I10 Essential (primary) hypertension: Secondary | ICD-10-CM

## 2023-08-08 DIAGNOSIS — R350 Frequency of micturition: Secondary | ICD-10-CM | POA: Diagnosis not present

## 2023-08-08 DIAGNOSIS — E782 Mixed hyperlipidemia: Secondary | ICD-10-CM | POA: Diagnosis not present

## 2023-08-08 DIAGNOSIS — N401 Enlarged prostate with lower urinary tract symptoms: Secondary | ICD-10-CM

## 2023-08-08 DIAGNOSIS — R7303 Prediabetes: Secondary | ICD-10-CM

## 2023-08-08 MED ORDER — TRAZODONE HCL 50 MG PO TABS: 25.0000 mg | ORAL_TABLET | Freq: Every evening | ORAL | 3 refills | Status: DC | PRN

## 2023-08-08 NOTE — Addendum Note (Signed)
 Addended by: Noemie Devivo M on: 08/08/2023 03:34 PM   Modules accepted: Orders

## 2023-08-08 NOTE — Progress Notes (Signed)
 Patient: Ryan [name] MRN: [id number] DOB: 1955/08/30 PCP: [name], DO     Subjective:  Chief Complaint  Patient presents with   Medical Management of Chronic Issues    Trouble sleeping, going through a divorce, wearing an ankle bracelet     HPI: The patient is a 68 y.o. male who presents today for complaints of feeling depressed and anxious.  He had previously been on Xanax  which he says he is discontinued for months prior.  He has some issues going on at home and stressors with the courts and are divorced.  He reports that he is not sleeping really at all and most nights is unable to sleep.  He reports that he is eating okay and is busy throughout the day.  He previously was on chronic pain medicine but this is also been discontinued and he reports that he has not been taking Xanax  or oxycodone  at all.  Review of Systems  Constitutional:  Negative for chills, fever and weight loss.  Eyes:  Negative for blurred vision.  Respiratory:  Negative for cough and shortness of breath.   Cardiovascular:  Negative for chest pain and palpitations.  Skin:  Negative for rash.  Psychiatric/Behavioral:  Negative for depression. The patient is not nervous/anxious.     Objective: Vitals:   08/08/23 1443  BP: 118/80  Pulse: 77  SpO2: 98%  Weight: 164 lb (74.4 kg)  Height: 5' 7 (1.702 m)    Body mass index is 25.69 kg/m.  Physical Exam Physical Exam Vitals reviewed.  Constitutional:      Appearance: Normal appearance. Well-developed with normal weight.  Cardiovascular:     Rate and Rhythm: Normal rate and regular rhythm. Normal heart sounds. Normal peripheral pulses Pulmonary:     Normal breath sounds with normal effort Skin:    General: Skin is warm and dry without noticeable rash. Neurological:     General: No focal deficit present.  Psychiatric:        Mood and Affect: Mood, behavior and cognition normal. Depressed, tearful in room patient     Assessment/plan:  #1 insomnia.  Will treat the patient with trazodone  50 mg at bedtime.  Monitor closely for side effects.  If this is ineffective doxepin may be appropriate.  He should have basic labs as has not had this done in some time.  Follow-up in 2 weeks  Noted history of crack cocaine use - should not be on any controlled substances as he is at extremely high use for abuse and substance addiction.  Problem List Items Addressed This Visit   None Visit Diagnoses       Health maintenance examination    -  Primary   Relevant Orders   CBC with Differential/Platelet   CMP14+EGFR   Lipid Panel With LDL/HDL Ratio   PSA   TSH + free T4   Urinalysis, Routine w reflex microscopic   B12 and Folate Panel   Iron, TIBC and Ferritin Panel         Allergies Patient is allergic to sertraline.  Past Medical History Patient  has a past medical history of Anxiety, Arthritis, Asthma, Chronic kidney disease, COPD (chronic obstructive pulmonary disease) (HCC), Dyspnea, GERD (gastroesophageal reflux disease), Hyperlipidemia, Hypertension, Lower urinary tract symptoms (LUTS), Prostate disease, Stroke (HCC), Thyroid  disease, Wears dentures, and Wears hearing aid.  Surgical History Patient  has a past surgical history that includes Eye surgery; Hernia repair; Transforaminal lumbar interbody fusion (tlif) with pedicle screw fixation 1 level (  Right, 06/18/2014); Myringotomy with tube placement (Bilateral, 03/28/2015); Back surgery; Esophagogastroduodenoscopy (egd) with propofol  (N/A, 10/24/2015); Cataract extraction w/PHACO (Left, 01/06/2016); Tonsillectomy; Colonoscopy with propofol  (N/A, 04/19/2016); and Sinusotomy (02/28/2019).  Family History Pateint's family history includes Arthritis in his mother; Diabetes in his father; Hypertension in his father and mother.  Social History Patient  reports that he has been smoking cigarettes. He has a 60 pack-year smoking history. He uses smokeless tobacco. He  reports current alcohol use. He reports current drug use. Drugs: Marijuana and Crack cocaine.    08/08/2023

## 2023-08-08 NOTE — Addendum Note (Signed)
 Addended by: Banner Huckaba M on: 08/08/2023 03:42 PM   Modules accepted: Orders

## 2023-08-09 ENCOUNTER — Telehealth: Payer: Self-pay

## 2023-08-09 ENCOUNTER — Ambulatory Visit: Payer: Self-pay

## 2023-08-09 NOTE — Telephone Encounter (Signed)
 Patient called and says the trazodone  did not help him sleep.  He says he has tried melatonin and benadryl  and they have not helped.  He says his mind races when he tried to go to bed.  Patient got mad and hung up the phone.  Called patient back and advised his message would be sent to provider.  Appointment also scheduled.

## 2023-08-09 NOTE — Progress Notes (Signed)
 A1C ordered

## 2023-08-09 NOTE — Telephone Encounter (Signed)
 See message.

## 2023-08-10 LAB — URINALYSIS, ROUTINE W REFLEX MICROSCOPIC
Bilirubin Urine: NEGATIVE
Glucose, UA: NEGATIVE
Hgb urine dipstick: NEGATIVE
Ketones, ur: NEGATIVE
Leukocytes,Ua: NEGATIVE
Nitrite: NEGATIVE
Protein, ur: NEGATIVE
Specific Gravity, Urine: 1.007 (ref 1.001–1.035)
pH: 5.5 (ref 5.0–8.0)

## 2023-08-10 LAB — CBC WITH DIFFERENTIAL/PLATELET
Absolute Lymphocytes: 2793 {cells}/uL (ref 850–3900)
Absolute Monocytes: 524 {cells}/uL (ref 200–950)
Basophils Absolute: 57 {cells}/uL (ref 0–200)
Basophils Relative: 0.5 %
Eosinophils Absolute: 228 {cells}/uL (ref 15–500)
Eosinophils Relative: 2 %
HCT: 51.5 % — ABNORMAL HIGH (ref 38.5–50.0)
Hemoglobin: 17.1 g/dL (ref 13.2–17.1)
MCH: 31.4 pg (ref 27.0–33.0)
MCHC: 33.2 g/dL (ref 32.0–36.0)
MCV: 94.5 fL (ref 80.0–100.0)
MPV: 10.2 fL (ref 7.5–12.5)
Monocytes Relative: 4.6 %
Neutro Abs: 7798 {cells}/uL (ref 1500–7800)
Neutrophils Relative %: 68.4 %
Platelets: 219 Thousand/uL (ref 140–400)
RBC: 5.45 Million/uL (ref 4.20–5.80)
RDW: 12.8 % (ref 11.0–15.0)
Total Lymphocyte: 24.5 %
WBC: 11.4 Thousand/uL — ABNORMAL HIGH (ref 3.8–10.8)

## 2023-08-10 LAB — HEMOGLOBIN A1C
Hgb A1c MFr Bld: 6 % — ABNORMAL HIGH (ref <5.7)
Mean Plasma Glucose: 126 mg/dL
eAG (mmol/L): 7 mmol/L

## 2023-08-10 LAB — COMPREHENSIVE METABOLIC PANEL WITH GFR
AG Ratio: 1.8 (calc) (ref 1.0–2.5)
ALT: 15 U/L (ref 9–46)
AST: 14 U/L (ref 10–35)
Albumin: 4.4 g/dL (ref 3.6–5.1)
Alkaline phosphatase (APISO): 139 U/L (ref 35–144)
BUN: 16 mg/dL (ref 7–25)
CO2: 25 mmol/L (ref 20–32)
Calcium: 9.4 mg/dL (ref 8.6–10.3)
Chloride: 106 mmol/L (ref 98–110)
Creat: 0.93 mg/dL (ref 0.70–1.35)
Globulin: 2.4 g/dL (ref 1.9–3.7)
Glucose, Bld: 85 mg/dL (ref 65–99)
Potassium: 4.6 mmol/L (ref 3.5–5.3)
Sodium: 141 mmol/L (ref 135–146)
Total Bilirubin: 0.3 mg/dL (ref 0.2–1.2)
Total Protein: 6.8 g/dL (ref 6.1–8.1)
eGFR: 90 mL/min/1.73m2 (ref >=60)

## 2023-08-10 LAB — TSH+FREE T4: TSH W/REFLEX TO FT4: 0.66 m[IU]/L (ref 0.40–4.50)

## 2023-08-10 LAB — B12 AND FOLATE PANEL
Folate: 10.2 ng/mL
Vitamin B-12: 361 pg/mL (ref 200–1100)

## 2023-08-10 LAB — TEST AUTHORIZATION

## 2023-08-10 NOTE — Telephone Encounter (Signed)
-----   Message from Parris DELENA Juneau sent at 08/09/2023  2:54 PM EDT ----- Please inform patient of test results.  Please make fu appt with PCP otherwise to address sleep issue and review further action on labs.   Thanks ----- Message ----- From: Rebecka Hose Lab Results In Sent: 08/08/2023  10:55 PM EDT To: Clarissa A Zafirov, MD

## 2023-08-10 NOTE — Telephone Encounter (Signed)
 Left message for patient regarding results and recommendations.  Ok for E2C2 to give results.

## 2023-08-17 ENCOUNTER — Ambulatory Visit: Admitting: Family Medicine

## 2023-08-22 ENCOUNTER — Ambulatory Visit

## 2023-09-05 ENCOUNTER — Ambulatory Visit: Admitting: Family Medicine

## 2023-09-08 ENCOUNTER — Ambulatory Visit (INDEPENDENT_AMBULATORY_CARE_PROVIDER_SITE_OTHER)

## 2023-09-08 ENCOUNTER — Other Ambulatory Visit: Payer: Self-pay

## 2023-09-08 VITALS — BP 122/70 | HR 76 | Ht 67.0 in | Wt 163.1 lb

## 2023-09-08 DIAGNOSIS — Z113 Encounter for screening for infections with a predominantly sexual mode of transmission: Secondary | ICD-10-CM | POA: Diagnosis not present

## 2023-09-08 DIAGNOSIS — Z202 Contact with and (suspected) exposure to infections with a predominantly sexual mode of transmission: Secondary | ICD-10-CM | POA: Insufficient documentation

## 2023-09-08 DIAGNOSIS — R300 Dysuria: Secondary | ICD-10-CM | POA: Diagnosis not present

## 2023-09-08 NOTE — Progress Notes (Signed)
 Acute Patient Visit  Physician: Dracen Reigle A Melania Kirks, MD  Patient: Ryan [name] MRN: [id number] DOB: 02-Feb-1955 PCP: [name], DO     Subjective:   Chief Complaint  Patient presents with   Exposure to STD    Possible std      HPI: The patient is a 68 y.o. male who presents today for:   Discussed the use of AI scribe software for clinical note transcription with the patient, who gave verbal consent to proceed.  History of Present Illness   Ryan [name] is a 68 year old male who presents with concerns about a possible STD and sleep issues.  Sexually transmitted infection exposure - Recent unprotected sexual encounter with a partner who is a known fentanyl  addict - History of sexually transmitted infections, but none recently - No dysuria, genital sores, or urethral discharge  Sleep disturbance - Significant difficulty with sleep, with prolonged periods of inadequate rest over several weeks - Doxazine previously ineffective for sleep - Advil PM effective in allowing uninterrupted sleep through the night  - no adverse effects     ROS:   As noted in the HPI    ASSESMENT/PLAN:  Encounter Diagnoses  Name Primary?   Screen for STD (sexually transmitted disease) Yes   Possible exposure to STD     Orders Placed This Encounter  Procedures   C. trachomatis/N. gonorrhoeae RNA   HIV Antibody (routine testing w rflx)   RPR   Hepatitis B surface antigen   Hepatitis C antibody    Assessment and Plan    Insomnia Chronic insomnia managed with Advil PM, containing diphenhydramine , with reported improvement in sleep. Advised to monitor for potential side effects such as urinary retention, especially given his age. - Continue Advil PM as needed for sleep  Screening for sexually transmitted infections Advised that HIV may not seroconvert immediately and to consider future testing if new symptoms arise. - Order urine test for gonorrhea and chlamydia -  Order blood tests for HIV, hepatitis, and syphilis - Advise to report any new symptoms such as dysuria or sores      OBJECTIVE: Vitals:   09/08/23 1333  BP: 122/70  Pulse: 76  SpO2: 94%  Weight: 163 lb 2 oz (74 kg)  Height: 5' 7 (1.702 m)    Body mass index is 25.55 kg/m.   Physical Exam Vitals reviewed.  Constitutional:      Appearance: Normal appearance. Well-developed with normal weight.  Cardiovascular:     Rate and Rhythm: Normal rate and regular rhythm. Normal heart sounds. Normal peripheral pulses Pulmonary:     Normal breath sounds with normal effort Skin:    General: Skin is warm and dry without noticeable rash. Neurological:     General: No focal deficit present.  Psychiatric:        Mood and Affect: Mood, behavior and cognition normal       Allergies Patient is allergic to sertraline.  Past Medical History Patient  has a past medical history of Anxiety, Arthritis, Asthma, Chronic kidney disease, COPD (chronic obstructive pulmonary disease) (HCC), Dyspnea, GERD (gastroesophageal reflux disease), Hyperlipidemia, Hypertension, Lower urinary tract symptoms (LUTS), Prostate disease, Stroke (HCC), Thyroid  disease, Wears dentures, and Wears hearing aid.  Surgical History Patient  has a past surgical history that includes Eye surgery; Hernia repair; Transforaminal lumbar interbody fusion (tlif) with pedicle screw fixation 1 level (Right, 06/18/2014); Myringotomy with tube placement (Bilateral, 03/28/2015); Back surgery; Esophagogastroduodenoscopy (egd) with propofol  (N/A, 10/24/2015);  Cataract extraction w/PHACO (Left, 01/06/2016); Tonsillectomy; Colonoscopy with propofol  (N/A, 04/19/2016); and Sinusotomy (02/28/2019).  Family History Pateint's family history includes Arthritis in his mother; Diabetes in his father; Hypertension in his father and mother.  Social History Patient  reports that he has been smoking cigarettes. He has a 60 pack-year smoking history. He uses  smokeless tobacco. He reports current alcohol use. He reports current drug use. Drugs: Marijuana and Crack cocaine.    09/08/2023

## 2023-09-09 LAB — HIV ANTIBODY (ROUTINE TESTING W REFLEX): HIV 1&2 Ab, 4th Generation: NONREACTIVE

## 2023-09-09 LAB — RPR: RPR Ser Ql: NONREACTIVE

## 2023-09-09 LAB — C. TRACHOMATIS/N. GONORRHOEAE RNA
C. trachomatis RNA, TMA: NOT DETECTED
N. gonorrhoeae RNA, TMA: NOT DETECTED

## 2023-09-09 LAB — HEPATITIS C ANTIBODY: Hepatitis C Ab: NONREACTIVE

## 2023-09-12 IMAGING — DX DG HAND COMPLETE 3+V*R*
3 series · 3 of 3 positions shown · non-contrast
Comparison: None.

CLINICAL DATA: Pain and swelling

EXAM:
RIGHT HAND - COMPLETE 3+ VIEW

[hand ap]
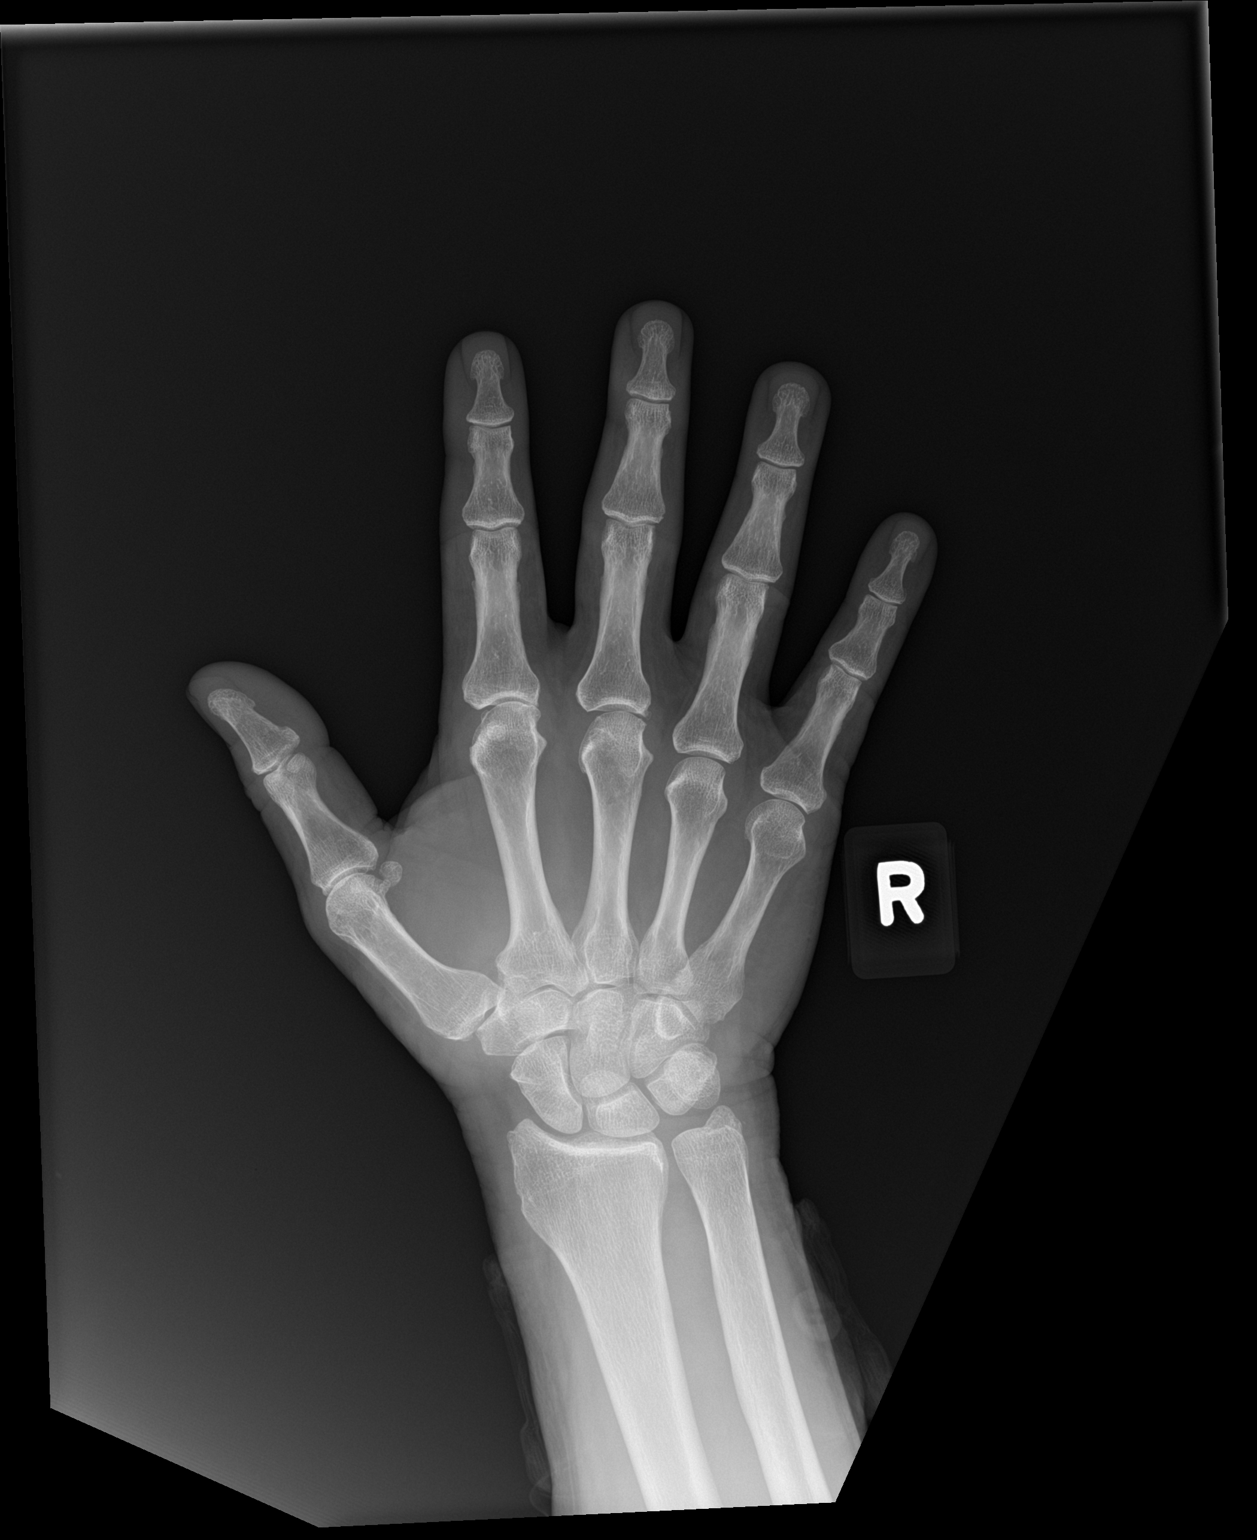

[hand obl]
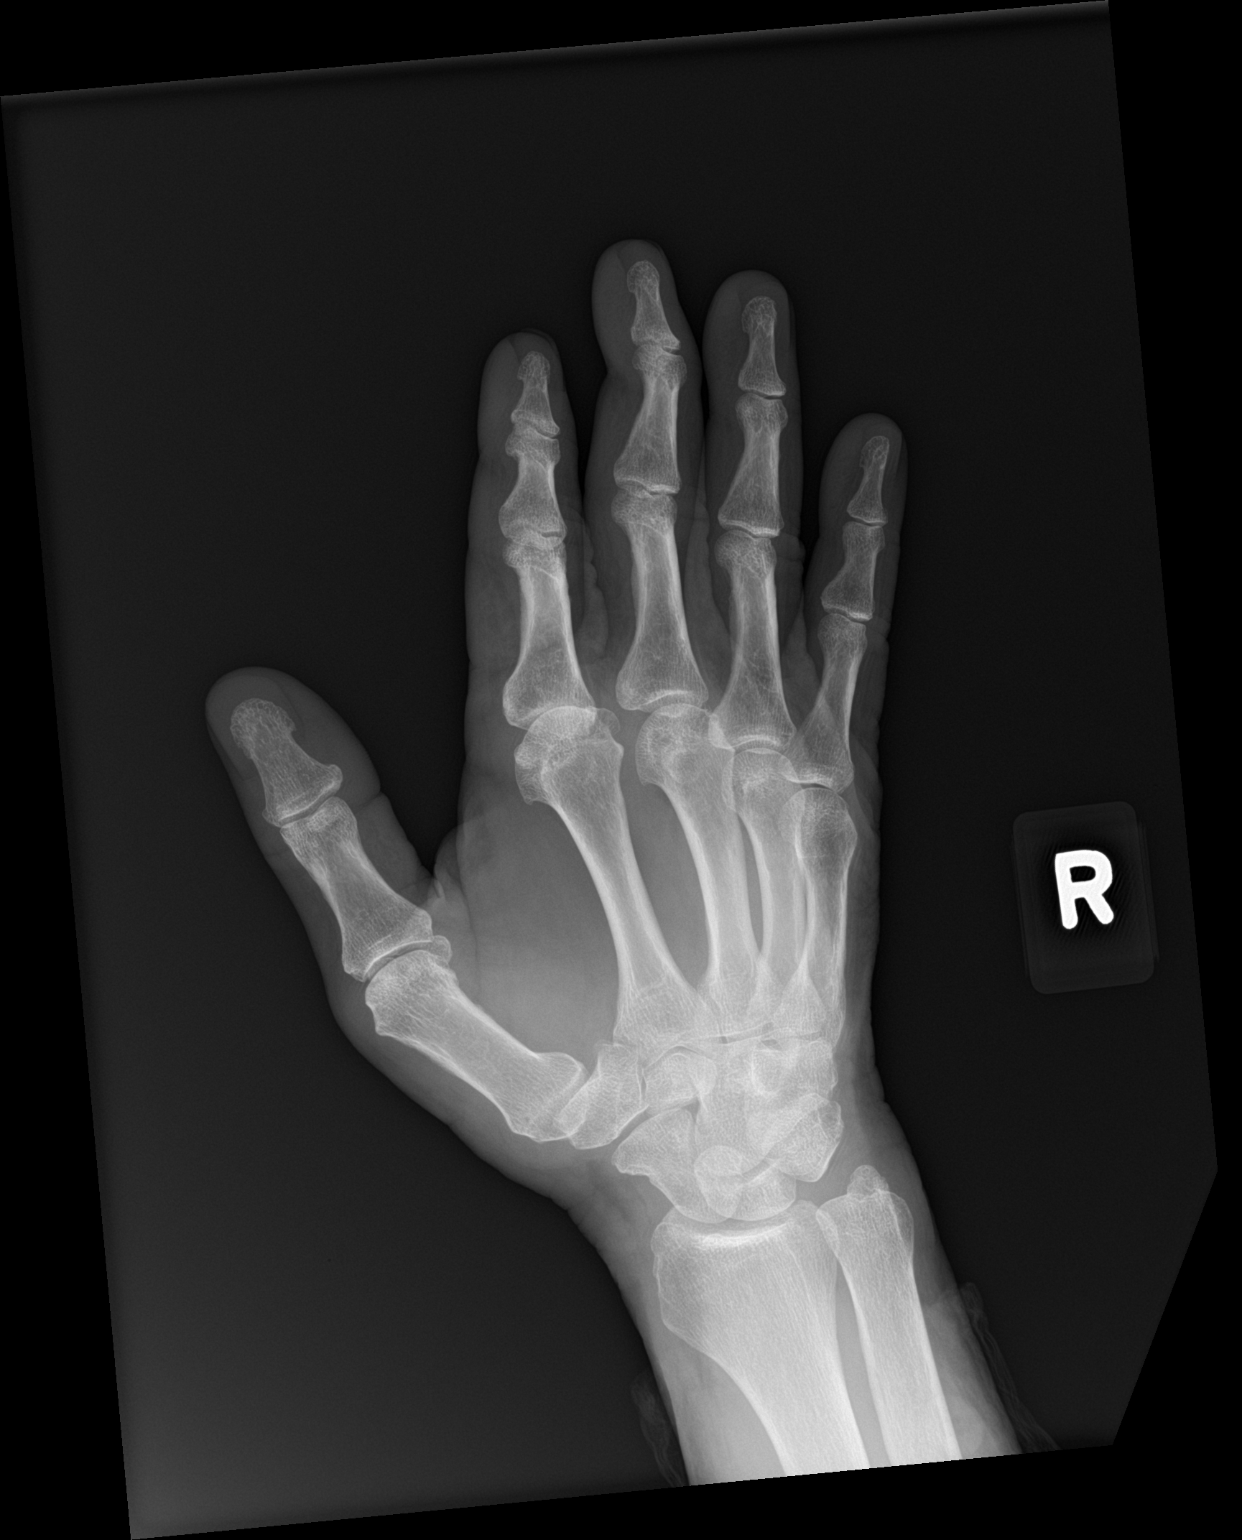

[hand lat]
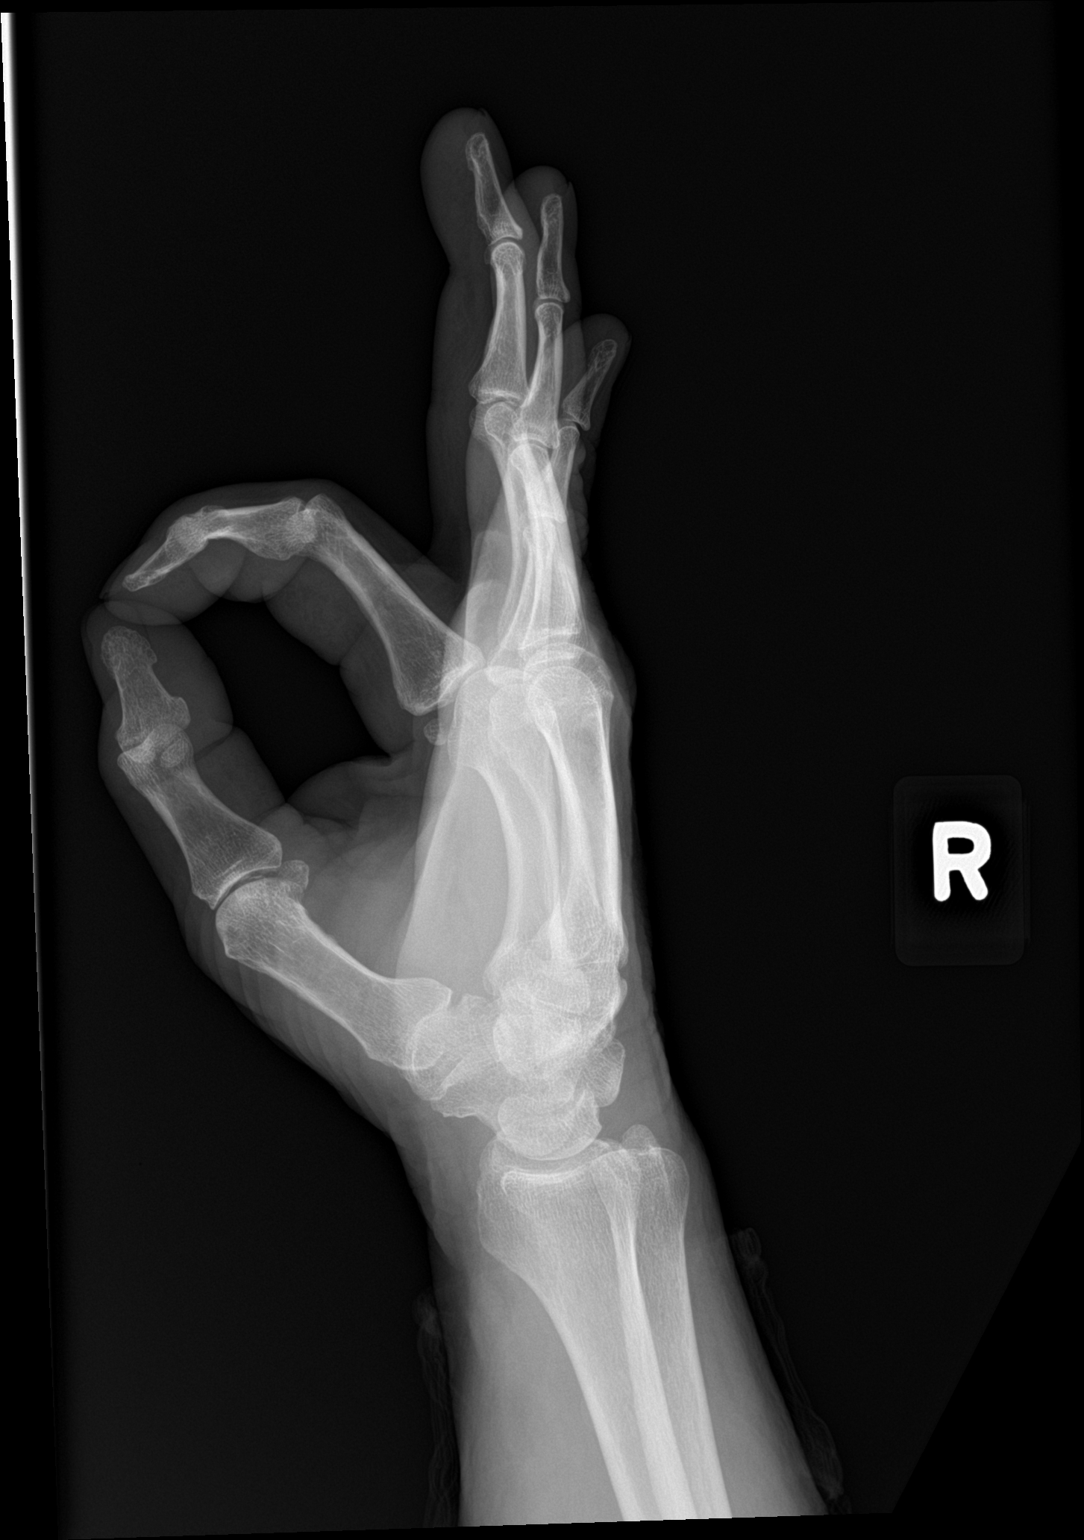

[3 of 3 positions shown; findings below may reference images not displayed]

FINDINGS: No fracture or dislocation is seen. There are no opaque foreign
bodies. There are no focal lytic lesions. Small bony spurs seen in
the metacarpophalangeal joints.
IMPRESSION: No fracture or dislocation is seen. There are no focal lytic
lesions. If there is clinical suspicion for osteomyelitis, follow-up
MRI may be considered.

## 2023-09-27 ENCOUNTER — Telehealth: Payer: Self-pay

## 2023-09-27 NOTE — Telephone Encounter (Signed)
 Copied from CRM #[id number]. Topic: Clinical - Lab/Test Results >> Sep 27, 2023  3:53 PM Berwyn MATSU wrote: Reason for CRM: patient called in requesting to review his test results from 09/08/23.   Per patient he is requesting a nurse to call him back.  May you please assist.

## 2023-09-27 NOTE — Telephone Encounter (Addendum)
 Please call patient to review results. I reviewed the notes Dr JENEANE saw him for STD screening / testing.  All results came back negative or normal. No sign of STD.  Negative HIV and Syphilis and Hepatitis C blood tests. Negative Gonorrhea and Chlamydia urine test  Marsa Officer, DO [facility] 09/27/2023, 4:42 PM

## 2023-09-27 NOTE — Telephone Encounter (Signed)
 Patient advised and verbalized understanding

## 2023-10-07 ENCOUNTER — Other Ambulatory Visit (HOSPITAL_COMMUNITY): Payer: Self-pay

## 2023-11-30 ENCOUNTER — Other Ambulatory Visit: Payer: Self-pay

## 2024-01-03 ENCOUNTER — Other Ambulatory Visit: Payer: Self-pay | Admitting: Family Medicine

## 2024-01-03 DIAGNOSIS — E782 Mixed hyperlipidemia: Secondary | ICD-10-CM

## 2024-01-05 NOTE — Telephone Encounter (Signed)
 Requested Prescriptions  Pending Prescriptions Disp Refills   atorvastatin  (LIPITOR ) 80 MG tablet [Pharmacy Med Name: Atorvastatin  Calcium  80 MG Oral Tablet] 90 tablet 0    Sig: TAKE 1 TABLET BY MOUTH AT BEDTIME     Cardiovascular:  Antilipid - Statins Failed - 01/05/2024 12:40 PM      Failed - Lipid Panel in normal range within the last 12 months    Cholesterol  Date Value Ref Range Status  11/30/2021 84 0 - 200 mg/dL Final   LDL Cholesterol (Calc)  Date Value Ref Range Status  11/18/2021 36 mg/dL (calc) Final    Comment:    Reference range: <100 . Desirable range <100 mg/dL for primary prevention;   <70 mg/dL for patients with CHD or diabetic patients  with > or = 2 CHD risk factors. [name] is now calculated using the Martin-Hopkins  calculation, which is a validated novel method providing  better accuracy than the Friedewald equation in the  estimation of LDL-C.  [name] et al. [name]. 7986;689(80): 2061-2068  (http://education.[facility].com/faq/FAQ164)    LDL Cholesterol  Date Value Ref Range Status  11/30/2021 39 0 - 99 mg/dL Final    Comment:           Total Cholesterol/HDL:CHD Risk Coronary Heart Disease Risk Table                     Men   Women  1/2 Average Risk   3.4   3.3  Average Risk       5.0   4.4  2 X Average Risk   9.6   7.1  3 X Average Risk  23.4   11.0        Use the calculated Patient Ratio above and the CHD Risk Table to determine the patient's CHD Risk.        ATP III CLASSIFICATION (LDL):  <100     mg/dL   Optimal  899-870  mg/dL   Near or Above                    Optimal  130-159  mg/dL   Borderline  839-810  mg/dL   High  >809     mg/dL   Very High Performed at [facility], [address] [place], [place] [address]    HDL  Date Value Ref Range Status  11/30/2021 40 (L) >40 mg/dL Final   Triglycerides  Date Value Ref Range Status  11/30/2021 26 <150 mg/dL Final         Passed - Patient is not pregnant       Passed - Valid encounter within last 12 months    Recent Outpatient Visits           3 months ago Screen for STD (sexually transmitted disease)   [facility] [facility] [name] LABOR, MD   5 months ago Health maintenance examination   [name] [facility] [place], [name], MD   7 months ago Acute non-recurrent frontal sinusitis   [place] [facility] [name], DO   8 months ago Degeneration of intervertebral disc of lumbosacral region with discogenic back pain   [place] [facility] [name], DO       Future Appointments             In 4 months [name], [name], MD [facility]

## 2024-01-29 ENCOUNTER — Other Ambulatory Visit: Payer: Self-pay | Admitting: Family Medicine

## 2024-01-29 DIAGNOSIS — I10 Essential (primary) hypertension: Secondary | ICD-10-CM

## 2024-01-31 NOTE — Telephone Encounter (Signed)
 Requested Prescriptions  Pending Prescriptions Disp Refills   lisinopril  (ZESTRIL ) 20 MG tablet [Pharmacy Med Name: Lisinopril  20 MG Oral Tablet] 90 tablet 0    Sig: Take 1 tablet by mouth once daily     Cardiovascular:  ACE Inhibitors Passed - 01/31/2024 10:08 AM      Passed - Cr in normal range and within 180 days    Creat  Date Value Ref Range Status  08/08/2023 0.93 0.70 - 1.35 mg/dL Final         Passed - K in normal range and within 180 days    Potassium  Date Value Ref Range Status  08/08/2023 4.6 3.5 - 5.3 mmol/L Final  07/15/2011 4.3 3.5 - 5.1 mmol/L Final         Passed - Patient is not pregnant      Passed - Last BP in normal range    BP Readings from Last 1 Encounters:  09/08/23 122/70         Passed - Valid encounter within last 6 months    Recent Outpatient Visits           4 months ago Screen for STD (sexually transmitted disease)   [place] [facility] [name] LABOR, MD   5 months ago Health maintenance examination   [facility] [place], [place] A, MD   8 months ago Acute non-recurrent frontal sinusitis   [place] [facility] [name], DO   9 months ago Degeneration of intervertebral disc of lumbosacral region with discogenic back pain   [place] [facility] [name], DO       Future Appointments             In 4 months [name], [name], MD [facility] [place]

## 2024-05-31 ENCOUNTER — Ambulatory Visit: Admitting: Urology

## 2024-07-06 ENCOUNTER — Ambulatory Visit

## 2024-07-11 ENCOUNTER — Ambulatory Visit
